# Patient Record
Sex: Male | Born: 1970 | Race: White | Hispanic: No | Marital: Single | Smoking: Never smoker
Health system: Southern US, Community
[De-identification: ages and names within clinical notes are randomized; demographics above are authoritative.]

## PROBLEM LIST (undated history)

## (undated) DIAGNOSIS — I509 Heart failure, unspecified: Secondary | ICD-10-CM

## (undated) DIAGNOSIS — I10 Essential (primary) hypertension: Secondary | ICD-10-CM

## (undated) DIAGNOSIS — E119 Type 2 diabetes mellitus without complications: Secondary | ICD-10-CM

## (undated) HISTORY — PX: TOE AMPUTATION: SHX809

## (undated) HISTORY — PX: CHOLECYSTECTOMY: SHX55

## (undated) HISTORY — PX: BACK SURGERY: SHX140

---

## 2002-06-14 ENCOUNTER — Ambulatory Visit (HOSPITAL_BASED_OUTPATIENT_CLINIC_OR_DEPARTMENT_OTHER): Admission: RE | Admit: 2002-06-14 | Discharge: 2002-06-14 | Payer: Self-pay | Admitting: Family Medicine

## 2002-08-11 ENCOUNTER — Ambulatory Visit (HOSPITAL_BASED_OUTPATIENT_CLINIC_OR_DEPARTMENT_OTHER): Admission: RE | Admit: 2002-08-11 | Discharge: 2002-08-11 | Payer: Self-pay | Admitting: Family Medicine

## 2011-01-02 ENCOUNTER — Emergency Department (HOSPITAL_COMMUNITY)
Admission: EM | Admit: 2011-01-02 | Discharge: 2011-01-02 | Disposition: A | Discharge status: Home / Self Care | Payer: Self-pay | Attending: Emergency Medicine | Admitting: Emergency Medicine

## 2011-01-02 DIAGNOSIS — E119 Type 2 diabetes mellitus without complications: Secondary | ICD-10-CM | POA: Insufficient documentation

## 2011-01-02 DIAGNOSIS — Z9119 Patient's noncompliance with other medical treatment and regimen: Secondary | ICD-10-CM | POA: Insufficient documentation

## 2011-01-02 DIAGNOSIS — L0889 Other specified local infections of the skin and subcutaneous tissue: Secondary | ICD-10-CM | POA: Insufficient documentation

## 2011-01-02 DIAGNOSIS — Z91199 Patient's noncompliance with other medical treatment and regimen due to unspecified reason: Secondary | ICD-10-CM | POA: Insufficient documentation

## 2011-01-02 DIAGNOSIS — I10 Essential (primary) hypertension: Secondary | ICD-10-CM | POA: Insufficient documentation

## 2011-01-02 DIAGNOSIS — Z8614 Personal history of Methicillin resistant Staphylococcus aureus infection: Secondary | ICD-10-CM | POA: Insufficient documentation

## 2011-01-02 LAB — CBC
Hemoglobin: 14.7 g/dL (ref 13.0–17.0)
MCHC: 35.2 g/dL (ref 30.0–36.0)
RBC: 5.11 MIL/uL (ref 4.22–5.81)

## 2011-01-02 LAB — GLUCOSE, CAPILLARY
Glucose-Capillary: 438 mg/dL — ABNORMAL HIGH (ref 70–99)
Glucose-Capillary: 300 mg/dL — ABNORMAL HIGH (ref 70–99)

## 2011-01-02 LAB — BASIC METABOLIC PANEL
CO2: 35 mEq/L — ABNORMAL HIGH (ref 19–32)
Calcium: 10.5 mg/dL (ref 8.4–10.5)
Chloride: 92 mEq/L — ABNORMAL LOW (ref 96–112)
GFR calc Af Amer: >60 mL/min (ref >60)
Sodium: 133 mEq/L — ABNORMAL LOW (ref 135–145)

## 2011-01-02 LAB — DIFFERENTIAL
Basophils Absolute: 0 10*3/uL (ref 0.0–0.1)
Basophils Relative: 0 % (ref 0–1)
Neutro Abs: 8.3 10*3/uL — ABNORMAL HIGH (ref 1.7–7.7)
Neutrophils Relative %: 68 % (ref 43–77)

## 2018-05-04 DIAGNOSIS — E119 Type 2 diabetes mellitus without complications: Secondary | ICD-10-CM | POA: Insufficient documentation

## 2018-05-04 DIAGNOSIS — Y999 Unspecified external cause status: Secondary | ICD-10-CM | POA: Insufficient documentation

## 2018-05-04 DIAGNOSIS — I10 Essential (primary) hypertension: Secondary | ICD-10-CM | POA: Insufficient documentation

## 2018-05-04 DIAGNOSIS — S3991XA Unspecified injury of abdomen, initial encounter: Secondary | ICD-10-CM | POA: Insufficient documentation

## 2018-05-04 DIAGNOSIS — Y9389 Activity, other specified: Secondary | ICD-10-CM | POA: Insufficient documentation

## 2018-05-04 DIAGNOSIS — S2242XA Multiple fractures of ribs, left side, initial encounter for closed fracture: Secondary | ICD-10-CM | POA: Diagnosis not present

## 2018-05-04 DIAGNOSIS — Y92410 Unspecified street and highway as the place of occurrence of the external cause: Secondary | ICD-10-CM | POA: Insufficient documentation

## 2018-05-04 DIAGNOSIS — R0789 Other chest pain: Secondary | ICD-10-CM | POA: Diagnosis present

## 2018-05-05 ENCOUNTER — Emergency Department (HOSPITAL_BASED_OUTPATIENT_CLINIC_OR_DEPARTMENT_OTHER): Payer: BLUE CROSS/BLUE SHIELD

## 2018-05-05 ENCOUNTER — Encounter (HOSPITAL_BASED_OUTPATIENT_CLINIC_OR_DEPARTMENT_OTHER): Payer: Self-pay | Admitting: *Deleted

## 2018-05-05 ENCOUNTER — Other Ambulatory Visit: Payer: Self-pay

## 2018-05-05 ENCOUNTER — Emergency Department (HOSPITAL_BASED_OUTPATIENT_CLINIC_OR_DEPARTMENT_OTHER)
Admission: EM | Admit: 2018-05-05 | Discharge: 2018-05-05 | Disposition: A | Discharge status: Home / Self Care | Payer: BLUE CROSS/BLUE SHIELD | Attending: Emergency Medicine | Admitting: Emergency Medicine

## 2018-05-05 DIAGNOSIS — S2242XA Multiple fractures of ribs, left side, initial encounter for closed fracture: Secondary | ICD-10-CM

## 2018-05-05 HISTORY — DX: Type 2 diabetes mellitus without complications: E11.9

## 2018-05-05 HISTORY — DX: Essential (primary) hypertension: I10

## 2018-05-05 LAB — CBC WITH DIFFERENTIAL/PLATELET
BASOS ABS: 0 10*3/uL (ref 0.0–0.1)
BASOS PCT: 0 %
EOS ABS: 0.2 10*3/uL (ref 0.0–0.7)
Eosinophils Relative: 2 %
HCT: 46.8 % (ref 39.0–52.0)
Hemoglobin: 15.3 g/dL (ref 13.0–17.0)
Lymphocytes Relative: 22 %
Lymphs Abs: 2.2 10*3/uL (ref 0.7–4.0)
MCH: 27.3 pg (ref 26.0–34.0)
MCHC: 32.7 g/dL (ref 30.0–36.0)
MCV: 83.6 fL (ref 78.0–100.0)
MONO ABS: 0.9 10*3/uL (ref 0.1–1.0)
MONOS PCT: 9 %
NEUTROS PCT: 67 %
Neutro Abs: 7.1 10*3/uL (ref 1.7–7.7)
Platelets: 229 10*3/uL (ref 150–400)
RBC: 5.6 MIL/uL (ref 4.22–5.81)
RDW: 13.3 % (ref 11.5–15.5)
WBC: 10.4 10*3/uL (ref 4.0–10.5)

## 2018-05-05 LAB — BASIC METABOLIC PANEL
Anion gap: 12 (ref 5–15)
BUN: 11 mg/dL (ref 6–20)
CALCIUM: 8.6 mg/dL — AB (ref 8.9–10.3)
CO2: 29 mmol/L (ref 22–32)
CREATININE: 0.77 mg/dL (ref 0.61–1.24)
Chloride: 92 mmol/L — ABNORMAL LOW (ref 98–111)
GLUCOSE: 462 mg/dL — AB (ref 70–99)
Potassium: 4.1 mmol/L (ref 3.5–5.1)
Sodium: 133 mmol/L — ABNORMAL LOW (ref 135–145)

## 2018-05-05 MED ORDER — OXYCODONE-ACETAMINOPHEN 5-325 MG PO TABS: 1.0000 | ORAL_TABLET | Freq: Four times a day (QID) | ORAL | 0 refills | Status: DC | PRN

## 2018-05-05 MED ORDER — IOPAMIDOL (ISOVUE-300) INJECTION 61%
100.0000 mL | Freq: Once | INTRAVENOUS | Status: AC
  Administered 2018-05-05: 100 mL via INTRAVENOUS

## 2018-05-05 MED ORDER — SODIUM CHLORIDE 0.9 % IV BOLUS
1000.0000 mL | Freq: Once | INTRAVENOUS | Status: AC
Start: 2018-05-05 — End: 2018-05-05
  Administered 2018-05-05: 1000 mL via INTRAVENOUS

## 2018-05-05 MED ORDER — HYDROMORPHONE HCL 1 MG/ML IJ SOLN
1.0000 mg | Freq: Once | INTRAMUSCULAR | Status: AC
  Administered 2018-05-05: 1 mg via INTRAVENOUS
  Filled 2018-05-05: qty 1
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
## 2018-05-05 NOTE — Discharge Instructions (Addendum)
Percocet as prescribed as needed for pain.  Up with your primary doctor in the next week for a recheck, and return to the emergency department if you develop worsening pain, high fever, difficulty breathing, or other new and concerning symptoms.

## 2018-05-05 NOTE — ED Notes (Signed)
Patient transported to CT 

## 2018-05-05 NOTE — ED Notes (Signed)
Pt understood dc material. NAD noted. Script given at dc  
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
## 2018-05-27 ENCOUNTER — Emergency Department (HOSPITAL_BASED_OUTPATIENT_CLINIC_OR_DEPARTMENT_OTHER): Payer: BLUE CROSS/BLUE SHIELD

## 2018-05-27 ENCOUNTER — Other Ambulatory Visit: Payer: Self-pay

## 2018-05-27 ENCOUNTER — Encounter (HOSPITAL_BASED_OUTPATIENT_CLINIC_OR_DEPARTMENT_OTHER): Payer: Self-pay

## 2018-05-27 ENCOUNTER — Emergency Department (HOSPITAL_BASED_OUTPATIENT_CLINIC_OR_DEPARTMENT_OTHER)
Admission: EM | Admit: 2018-05-27 | Discharge: 2018-05-27 | Disposition: A | Discharge status: Home / Self Care | Payer: BLUE CROSS/BLUE SHIELD | Attending: Emergency Medicine | Admitting: Emergency Medicine

## 2018-05-27 DIAGNOSIS — I10 Essential (primary) hypertension: Secondary | ICD-10-CM | POA: Insufficient documentation

## 2018-05-27 DIAGNOSIS — Z7984 Long term (current) use of oral hypoglycemic drugs: Secondary | ICD-10-CM | POA: Insufficient documentation

## 2018-05-27 DIAGNOSIS — S2242XD Multiple fractures of ribs, left side, subsequent encounter for fracture with routine healing: Secondary | ICD-10-CM | POA: Insufficient documentation

## 2018-05-27 DIAGNOSIS — S2242XA Multiple fractures of ribs, left side, initial encounter for closed fracture: Secondary | ICD-10-CM

## 2018-05-27 DIAGNOSIS — R072 Precordial pain: Secondary | ICD-10-CM | POA: Diagnosis not present

## 2018-05-27 DIAGNOSIS — E119 Type 2 diabetes mellitus without complications: Secondary | ICD-10-CM | POA: Diagnosis not present

## 2018-05-27 DIAGNOSIS — R079 Chest pain, unspecified: Secondary | ICD-10-CM | POA: Diagnosis present

## 2018-05-27 LAB — CBC
HCT: 43.3 % (ref 39.0–52.0)
HEMOGLOBIN: 14.3 g/dL (ref 13.0–17.0)
MCH: 28.5 pg (ref 26.0–34.0)
MCHC: 33 g/dL (ref 30.0–36.0)
MCV: 86.4 fL (ref 80.0–100.0)
Platelets: 233 10*3/uL (ref 150–400)
RBC: 5.01 MIL/uL (ref 4.22–5.81)
RDW: 12.9 % (ref 11.5–15.5)
WBC: 9 10*3/uL (ref 4.0–10.5)
nRBC: 0 % (ref 0.0–0.2)

## 2018-05-27 LAB — BASIC METABOLIC PANEL
Anion gap: 8 (ref 5–15)
BUN: 12 mg/dL (ref 6–20)
CHLORIDE: 99 mmol/L (ref 98–111)
CO2: 26 mmol/L (ref 22–32)
Calcium: 8.8 mg/dL — ABNORMAL LOW (ref 8.9–10.3)
Creatinine, Ser: 0.6 mg/dL — ABNORMAL LOW (ref 0.61–1.24)
GFR calc Af Amer: >60 mL/min (ref >60)
GFR calc non Af Amer: >60 mL/min (ref >60)
GLUCOSE: 382 mg/dL — AB (ref 70–99)
Potassium: 3.9 mmol/L (ref 3.5–5.1)
Sodium: 133 mmol/L — ABNORMAL LOW (ref 135–145)

## 2018-05-27 LAB — TROPONIN I: Troponin I: <0.03 ng/mL (ref <0.03)

## 2018-05-27 MED ORDER — HYDROCODONE-ACETAMINOPHEN 5-325 MG PO TABS: 1.0000 | ORAL_TABLET | ORAL | 0 refills | Status: DC | PRN

## 2018-05-27 MED ORDER — OXYCODONE-ACETAMINOPHEN 5-325 MG PO TABS
2.0000 | ORAL_TABLET | Freq: Once | ORAL | Status: AC
  Administered 2018-05-27: 2 via ORAL
  Filled 2018-05-27: qty 2

## 2018-05-27 MED ORDER — IOPAMIDOL (ISOVUE-370) INJECTION 76%
100.0000 mL | Freq: Once | INTRAVENOUS | Status: AC | PRN
  Administered 2018-05-27: 100 mL via INTRAVENOUS

## 2018-05-27 NOTE — ED Notes (Signed)
Pt verbalizes understanding of d/c instructions and denies any further needs at this time. 
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
## 2018-05-27 NOTE — ED Notes (Signed)
Patient transported to CT 

## 2019-03-16 ENCOUNTER — Emergency Department (HOSPITAL_COMMUNITY): Payer: BC Managed Care – PPO

## 2019-03-16 ENCOUNTER — Encounter (HOSPITAL_COMMUNITY): Payer: Self-pay | Admitting: *Deleted

## 2019-03-16 ENCOUNTER — Emergency Department (HOSPITAL_COMMUNITY)
Admission: EM | Admit: 2019-03-16 | Discharge: 2019-03-16 | Disposition: A | Discharge status: Home / Self Care | Payer: BC Managed Care – PPO | Attending: Emergency Medicine | Admitting: Emergency Medicine

## 2019-03-16 ENCOUNTER — Other Ambulatory Visit: Payer: Self-pay

## 2019-03-16 DIAGNOSIS — Y92410 Unspecified street and highway as the place of occurrence of the external cause: Secondary | ICD-10-CM | POA: Insufficient documentation

## 2019-03-16 DIAGNOSIS — S80212A Abrasion, left knee, initial encounter: Secondary | ICD-10-CM | POA: Insufficient documentation

## 2019-03-16 DIAGNOSIS — S50311A Abrasion of right elbow, initial encounter: Secondary | ICD-10-CM | POA: Diagnosis not present

## 2019-03-16 DIAGNOSIS — Y999 Unspecified external cause status: Secondary | ICD-10-CM | POA: Insufficient documentation

## 2019-03-16 DIAGNOSIS — I10 Essential (primary) hypertension: Secondary | ICD-10-CM | POA: Insufficient documentation

## 2019-03-16 DIAGNOSIS — S2231XA Fracture of one rib, right side, initial encounter for closed fracture: Secondary | ICD-10-CM | POA: Diagnosis not present

## 2019-03-16 DIAGNOSIS — S50312A Abrasion of left elbow, initial encounter: Secondary | ICD-10-CM | POA: Insufficient documentation

## 2019-03-16 DIAGNOSIS — S80211A Abrasion, right knee, initial encounter: Secondary | ICD-10-CM | POA: Diagnosis not present

## 2019-03-16 DIAGNOSIS — Z7984 Long term (current) use of oral hypoglycemic drugs: Secondary | ICD-10-CM | POA: Insufficient documentation

## 2019-03-16 DIAGNOSIS — Y939 Activity, unspecified: Secondary | ICD-10-CM | POA: Diagnosis not present

## 2019-03-16 DIAGNOSIS — Z79899 Other long term (current) drug therapy: Secondary | ICD-10-CM | POA: Insufficient documentation

## 2019-03-16 DIAGNOSIS — T07XXXA Unspecified multiple injuries, initial encounter: Secondary | ICD-10-CM

## 2019-03-16 DIAGNOSIS — E1165 Type 2 diabetes mellitus with hyperglycemia: Secondary | ICD-10-CM | POA: Insufficient documentation

## 2019-03-16 DIAGNOSIS — R739 Hyperglycemia, unspecified: Secondary | ICD-10-CM

## 2019-03-16 DIAGNOSIS — S29001A Unspecified injury of muscle and tendon of front wall of thorax, initial encounter: Secondary | ICD-10-CM | POA: Diagnosis present

## 2019-03-16 LAB — CBG MONITORING, ED: Glucose-Capillary: 474 mg/dL — ABNORMAL HIGH (ref 70–99)

## 2019-03-16 MED ORDER — PERCOCET 5-325 MG PO TABS: 1.0000 | ORAL_TABLET | Freq: Four times a day (QID) | ORAL | 0 refills | Status: DC | PRN

## 2019-03-16 MED ORDER — INSULIN ASPART 100 UNIT/ML ~~LOC~~ SOLN
8.0000 [IU] | Freq: Once | SUBCUTANEOUS | Status: AC
  Administered 2019-03-16: 8 [IU] via SUBCUTANEOUS
  Filled 2019-03-16: qty 1

## 2019-03-16 MED ORDER — KETOROLAC TROMETHAMINE 30 MG/ML IJ SOLN
30.0000 mg | Freq: Once | INTRAMUSCULAR | Status: AC
  Administered 2019-03-16: 30 mg via INTRAMUSCULAR
  Filled 2019-03-16: qty 1

## 2019-03-16 MED ORDER — NAPROXEN 500 MG PO TABS: 500.0000 mg | ORAL_TABLET | Freq: Two times a day (BID) | ORAL | 0 refills | Status: DC

## 2019-03-16 MED ORDER — CEPHALEXIN 500 MG PO CAPS
500.0000 mg | ORAL_CAPSULE | Freq: Once | ORAL | Status: AC
  Administered 2019-03-16: 500 mg via ORAL
  Filled 2019-03-16: qty 1

## 2019-03-16 MED ORDER — CYCLOBENZAPRINE HCL 5 MG PO TABS: 5.0000 mg | ORAL_TABLET | Freq: Three times a day (TID) | ORAL | 0 refills | Status: DC | PRN

## 2019-03-16 MED ORDER — CEPHALEXIN 500 MG PO CAPS: 500.0000 mg | ORAL_CAPSULE | Freq: Three times a day (TID) | ORAL | 0 refills | Status: DC

## 2019-03-16 NOTE — Discharge Instructions (Addendum)
Take the medication as prescribed for your pain.  Use ice packs for comfort. Take the antibiotics.  Try to clean your abrasions once a day with soap and water.  Try to take a big breath every 30 to 60 minutes so your lung does not collapse and you get pneumonia.  Recheck if you get a fever, cough, struggle to breathe, or you start coughing up blood or mucus.  Please look at the diabetic diet information, your diabetes is not well controlled.  If you need longer time out for work or more pain medication you will need to discuss that with your primary care doctor.
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
## 2019-03-16 NOTE — ED Notes (Signed)
Respiratory at bedside to perform instructions on IS.

## 2019-03-16 NOTE — ED Notes (Signed)
Advised patient not to drive while taking prescription pain medications. Patient verbalized understanding.  
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
## 2019-09-07 ENCOUNTER — Encounter (HOSPITAL_BASED_OUTPATIENT_CLINIC_OR_DEPARTMENT_OTHER): Payer: Self-pay | Admitting: Emergency Medicine

## 2019-09-07 ENCOUNTER — Emergency Department (HOSPITAL_BASED_OUTPATIENT_CLINIC_OR_DEPARTMENT_OTHER): Payer: BC Managed Care – PPO

## 2019-09-07 ENCOUNTER — Emergency Department (HOSPITAL_BASED_OUTPATIENT_CLINIC_OR_DEPARTMENT_OTHER)
Admission: EM | Admit: 2019-09-07 | Discharge: 2019-09-07 | Disposition: A | Discharge status: Home / Self Care | Payer: BC Managed Care – PPO | Attending: Emergency Medicine | Admitting: Emergency Medicine

## 2019-09-07 ENCOUNTER — Other Ambulatory Visit: Payer: Self-pay

## 2019-09-07 DIAGNOSIS — Y999 Unspecified external cause status: Secondary | ICD-10-CM | POA: Insufficient documentation

## 2019-09-07 DIAGNOSIS — Z79899 Other long term (current) drug therapy: Secondary | ICD-10-CM | POA: Diagnosis not present

## 2019-09-07 DIAGNOSIS — W2209XA Striking against other stationary object, initial encounter: Secondary | ICD-10-CM | POA: Insufficient documentation

## 2019-09-07 DIAGNOSIS — S93601A Unspecified sprain of right foot, initial encounter: Secondary | ICD-10-CM

## 2019-09-07 DIAGNOSIS — L03115 Cellulitis of right lower limb: Secondary | ICD-10-CM | POA: Diagnosis not present

## 2019-09-07 DIAGNOSIS — Y92003 Bedroom of unspecified non-institutional (private) residence as the place of occurrence of the external cause: Secondary | ICD-10-CM | POA: Insufficient documentation

## 2019-09-07 DIAGNOSIS — E119 Type 2 diabetes mellitus without complications: Secondary | ICD-10-CM | POA: Insufficient documentation

## 2019-09-07 DIAGNOSIS — I10 Essential (primary) hypertension: Secondary | ICD-10-CM | POA: Diagnosis not present

## 2019-09-07 DIAGNOSIS — Y939 Activity, unspecified: Secondary | ICD-10-CM | POA: Insufficient documentation

## 2019-09-07 DIAGNOSIS — Z7984 Long term (current) use of oral hypoglycemic drugs: Secondary | ICD-10-CM | POA: Diagnosis not present

## 2019-09-07 DIAGNOSIS — S99921A Unspecified injury of right foot, initial encounter: Secondary | ICD-10-CM | POA: Diagnosis present

## 2019-09-07 LAB — CBG MONITORING, ED: Glucose-Capillary: 342 mg/dL — ABNORMAL HIGH (ref 70–99)

## 2019-09-07 MED ORDER — IBUPROFEN 600 MG PO TABS: 600.0000 mg | ORAL_TABLET | Freq: Four times a day (QID) | ORAL | 0 refills | Status: DC | PRN

## 2019-09-07 MED ORDER — CLINDAMYCIN HCL 150 MG PO CAPS
450.0000 mg | ORAL_CAPSULE | Freq: Once | ORAL | Status: AC
  Administered 2019-09-07: 450 mg via ORAL
  Filled 2019-09-07: qty 3

## 2019-09-07 MED ORDER — CLINDAMYCIN HCL 150 MG PO CAPS: 300.0000 mg | ORAL_CAPSULE | Freq: Four times a day (QID) | ORAL | 0 refills | Status: AC

## 2019-09-07 MED ORDER — IBUPROFEN 800 MG PO TABS
800.0000 mg | ORAL_TABLET | Freq: Once | ORAL | Status: AC
  Administered 2019-09-07: 21:00:00 800 mg via ORAL
  Filled 2019-09-07: qty 1

## 2019-09-07 NOTE — ED Provider Notes (Signed)
[facility] EMERGENCY DEPARTMENT Provider Note   CSN: [id number] Arrival date & time: 09/07/19  1854     History Chief Complaint  Patient presents with  . Foot Injury    Dakota Ramirez is a 49 y.o. male w/ DM, HTN, presenting to the ED with right foot injury after accidentally kicking his nightstand 4 nights ago.  He has a swollen and painful right foot.  He denies fevers, chills.  He denies open wounds on his foot.  Reports some paresthesias in his right foot.    No other reported injuries. NKDA  HPI     Past Medical History:  Diagnosis Date  . Diabetes mellitus without complication (HCC)   . Hypertension     There are no problems to display for this patient.   Past Surgical History:  Procedure Laterality Date  . BACK SURGERY    . CHOLECYSTECTOMY         Family History  Problem Relation Age of Onset  . Diabetes Mother   . Hypertension Mother     Social History   Tobacco Use  . Smoking status: Never Smoker  . Smokeless tobacco: Never Used  Substance Use Topics  . Alcohol use: Never  . Drug use: Never    Home Medications Prior to Admission medications   Medication Sig Start Date End Date Taking? Authorizing Provider  cephALEXin (KEFLEX) 500 MG capsule Take 1 capsule (500 mg total) by mouth 3 (three) times daily. 03/16/19   Devoria Albe, MD  clindamycin (CLEOCIN) 150 MG capsule Take 2 capsules (300 mg total) by mouth every 6 (six) hours for 7 days. 09/08/19 09/15/19  Terald Sleeper, MD  cyclobenzaprine (FLEXERIL) 5 MG tablet Take 1 tablet (5 mg total) by mouth 3 (three) times daily as needed. 03/16/19   Devoria Albe, MD  GLIPIZIDE PO Take by mouth.    [provider]  HYDROcodone-acetaminophen (NORCO/VICODIN) 5-325 MG tablet Take 1 tablet by mouth every 4 (four) hours as needed for moderate pain. 05/27/18   Azalia Bilis, MD  ibuprofen (ADVIL) 600 MG tablet Take 1 tablet (600 mg total) by mouth every 6 (six) hours as needed. 09/07/19   Terald Sleeper, MD  LISINOPRIL PO Take by mouth.    [provider]  naproxen (NAPROSYN) 500 MG tablet Take 1 tablet (500 mg total) by mouth 2 (two) times daily. 03/16/19   Devoria Albe, MD  PERCOCET 5-325 MG tablet Take 1 tablet by mouth every 6 (six) hours as needed for severe pain. 03/16/19   Devoria Albe, MD  SITagliptin-metFORMIN HCl (JANUMET PO) Take by mouth.    [provider]    Allergies    Patient has no known allergies.  Review of Systems   Review of Systems  Constitutional: Negative for chills and fever.  Respiratory: Negative for cough and shortness of breath.   Cardiovascular: Negative for chest pain and palpitations.  Gastrointestinal: Negative for abdominal pain and vomiting.  Genitourinary: Negative for dysuria and hematuria.  Musculoskeletal: Positive for arthralgias, joint swelling and myalgias.  Skin: Positive for color change and rash.  Neurological: Negative for syncope and headaches.  All other systems reviewed and are negative.   Physical Exam Updated Vital Signs BP 137/88 (BP Location: Left Arm)   Pulse (!) 117   Temp 98.2 F (36.8 C) (Oral)   Resp 18   Ht 5\' 11"  (1.803 m)   Wt 117.9 kg   SpO2 98%   BMI 36.26 kg/m  Physical Exam Vitals and nursing note reviewed.  Constitutional:      Appearance: He is well-developed.  HENT:     Head: Normocephalic and atraumatic.  Eyes:     Conjunctiva/sclera: Conjunctivae normal.  Cardiovascular:     Rate and Rhythm: Normal rate and regular rhythm.     Pulses: Normal pulses.  Pulmonary:     Effort: Pulmonary effort is normal. No respiratory distress.  Musculoskeletal:     Cervical back: Neck supple.  Skin:    General: Skin is warm and dry.     Comments: Soft tissue swelling, warmth and edema of the dorsum of the right foot, generalized mid-foot tenderness No posterior malleolar tenderness of the right ankle  Neurological:     Mental Status: He is alert and oriented to person, place, and time.        ED Results / Procedures / Treatments   Labs (all labs ordered are listed, but only abnormal results are displayed) Labs Reviewed  CBG MONITORING, ED - Abnormal; Notable for the following components:      Result Value   Glucose-Capillary 342 (*)    All other components within normal limits    EKG None  Radiology DG Foot Complete Right  Result Date: 09/07/2019 CLINICAL DATA:  Injury with pain and swelling EXAM: RIGHT FOOT COMPLETE - 3+ VIEW COMPARISON:  None. FINDINGS: There is no evidence of fracture or dislocation. There is no evidence of arthropathy or other focal bone abnormality. Dorsal soft tissue swelling. Small plantar calcaneal spur IMPRESSION: No acute osseous abnormality Electronically Signed   By: Jasmine Pang M.D.   On: 09/07/2019 19:43    Procedures Procedures (including critical care time)  Medications Ordered in ED Medications  clindamycin (CLEOCIN) capsule 450 mg (450 mg Oral Given 09/07/19 2058)  ibuprofen (ADVIL) tablet 800 mg (800 mg Oral Given 09/07/19 2058)    ED Course  I have reviewed the triage vital signs and the nursing notes.  Pertinent labs & imaging results that were available during my care of the patient were reviewed by me and considered in my medical decision making (see chart for details).  49 yo male presenting to ED 4 days after kicking a nightstand with pain and swelling in his right foot.  Photo as noted above.  There is a significant amount of edema of the foot.  No crepitus or open wound or free air seen on xray to suggest nec fasc.    Given his warmth and redness, as well as his history of diabetes that appears not to be optimally controlled, I think it's reasonable to treat with a course of clindamycin for cellulitis.  I carefully discussed with him return precautions - for fevers, redness spreading up the leg, or purple/black discoloration of the foot or toes.  He verbalized understanding.  We'll also put him in a boot and make  non-weight bearing until he can be seen by orthopedics.  Advised follow up this week.  Clinical Course as of Sep 07 1112  Mon Sep 07, 2019  2034 FINDINGS: There is no evidence of fracture or dislocation. There is no evidence of arthropathy or other focal bone abnormality. Dorsal soft tissue swelling. Small plantar calcaneal spur  IMPRESSION: No acute osseous abnormality      [MT]  2034 We will start the patient on clindamycin.  Advised him to closely monitor his foot wound.  If the redness spreading further up the leg, or begins having fevers, told he needs to come  back immediately to the emergency department.  Also given crutches and instructed that he be nonweightbearing.   [MT]    Clinical Course User Index [MT] Wyvonnia Dusky, MD   Final Clinical Impression(s) / ED Diagnoses Final diagnoses:  Sprain of right foot, initial encounter  Cellulitis of right lower extremity    Rx / DC Orders ED Discharge Orders         Ordered    clindamycin (CLEOCIN) 150 MG capsule  Every 6 hours     09/07/19 2038    ibuprofen (ADVIL) 600 MG tablet  Every 6 hours PRN     09/07/19 2038           Wyvonnia Dusky, MD 09/08/19 1116

## 2019-09-07 NOTE — ED Triage Notes (Signed)
Pt states he got up to use the restroom and kicked into the end table about 3 -4 nights ago  Pt is c/o right foot pain  Pt states it is swollen

## 2019-09-07 NOTE — Discharge Instructions (Signed)
I am treating you for a skin infection of your right foot and also for a possible severe sprain.  You should use crutches at home and not put weight on this foot.  You need to follow up with an orthopedic doctor this week.  Take your full course of antibiotics.  If the redness is spreading further up your leg, or you start having fevers, or your skin turns dark purple or black, come back to the ER immediately.

## 2021-11-26 ENCOUNTER — Other Ambulatory Visit: Payer: Self-pay

## 2021-11-26 ENCOUNTER — Encounter (HOSPITAL_BASED_OUTPATIENT_CLINIC_OR_DEPARTMENT_OTHER): Payer: Self-pay | Admitting: Emergency Medicine

## 2021-11-26 ENCOUNTER — Inpatient Hospital Stay (HOSPITAL_BASED_OUTPATIENT_CLINIC_OR_DEPARTMENT_OTHER)
Admission: EM | Admit: 2021-11-26 | Discharge: 2021-12-01 | DRG: 286 | Disposition: A | Discharge status: Home / Self Care | Payer: Self-pay | Attending: Internal Medicine | Admitting: Internal Medicine

## 2021-11-26 ENCOUNTER — Emergency Department (HOSPITAL_BASED_OUTPATIENT_CLINIC_OR_DEPARTMENT_OTHER): Payer: Self-pay

## 2021-11-26 DIAGNOSIS — I5020 Unspecified systolic (congestive) heart failure: Secondary | ICD-10-CM

## 2021-11-26 DIAGNOSIS — E11621 Type 2 diabetes mellitus with foot ulcer: Secondary | ICD-10-CM

## 2021-11-26 DIAGNOSIS — I10 Essential (primary) hypertension: Secondary | ICD-10-CM

## 2021-11-26 DIAGNOSIS — J9811 Atelectasis: Secondary | ICD-10-CM | POA: Diagnosis not present

## 2021-11-26 DIAGNOSIS — I428 Other cardiomyopathies: Secondary | ICD-10-CM | POA: Diagnosis present

## 2021-11-26 DIAGNOSIS — Z560 Unemployment, unspecified: Secondary | ICD-10-CM

## 2021-11-26 DIAGNOSIS — Z89421 Acquired absence of other right toe(s): Secondary | ICD-10-CM

## 2021-11-26 DIAGNOSIS — I340 Nonrheumatic mitral (valve) insufficiency: Secondary | ICD-10-CM | POA: Diagnosis present

## 2021-11-26 DIAGNOSIS — E1165 Type 2 diabetes mellitus with hyperglycemia: Secondary | ICD-10-CM | POA: Diagnosis present

## 2021-11-26 DIAGNOSIS — Z91148 Patient's other noncompliance with medication regimen for other reason: Secondary | ICD-10-CM

## 2021-11-26 DIAGNOSIS — I509 Heart failure, unspecified: Secondary | ICD-10-CM

## 2021-11-26 DIAGNOSIS — I5021 Acute systolic (congestive) heart failure: Secondary | ICD-10-CM | POA: Diagnosis present

## 2021-11-26 DIAGNOSIS — R188 Other ascites: Secondary | ICD-10-CM | POA: Diagnosis present

## 2021-11-26 DIAGNOSIS — I110 Hypertensive heart disease with heart failure: Principal | ICD-10-CM | POA: Diagnosis present

## 2021-11-26 DIAGNOSIS — N5089 Other specified disorders of the male genital organs: Secondary | ICD-10-CM | POA: Diagnosis present

## 2021-11-26 DIAGNOSIS — E669 Obesity, unspecified: Secondary | ICD-10-CM | POA: Diagnosis present

## 2021-11-26 DIAGNOSIS — Z79899 Other long term (current) drug therapy: Secondary | ICD-10-CM

## 2021-11-26 DIAGNOSIS — E1151 Type 2 diabetes mellitus with diabetic peripheral angiopathy without gangrene: Secondary | ICD-10-CM | POA: Diagnosis present

## 2021-11-26 DIAGNOSIS — E8809 Other disorders of plasma-protein metabolism, not elsewhere classified: Secondary | ICD-10-CM | POA: Diagnosis not present

## 2021-11-26 DIAGNOSIS — Z608 Other problems related to social environment: Secondary | ICD-10-CM | POA: Diagnosis present

## 2021-11-26 DIAGNOSIS — Z833 Family history of diabetes mellitus: Secondary | ICD-10-CM

## 2021-11-26 DIAGNOSIS — E119 Type 2 diabetes mellitus without complications: Secondary | ICD-10-CM

## 2021-11-26 DIAGNOSIS — Z8249 Family history of ischemic heart disease and other diseases of the circulatory system: Secondary | ICD-10-CM

## 2021-11-26 DIAGNOSIS — Y929 Unspecified place or not applicable: Secondary | ICD-10-CM

## 2021-11-26 DIAGNOSIS — W57XXXA Bitten or stung by nonvenomous insect and other nonvenomous arthropods, initial encounter: Secondary | ICD-10-CM | POA: Diagnosis present

## 2021-11-26 DIAGNOSIS — Z6838 Body mass index (BMI) 38.0-38.9, adult: Secondary | ICD-10-CM

## 2021-11-26 DIAGNOSIS — Z89411 Acquired absence of right great toe: Secondary | ICD-10-CM

## 2021-11-26 DIAGNOSIS — I739 Peripheral vascular disease, unspecified: Secondary | ICD-10-CM | POA: Diagnosis present

## 2021-11-26 DIAGNOSIS — E871 Hypo-osmolality and hyponatremia: Secondary | ICD-10-CM | POA: Diagnosis present

## 2021-11-26 DIAGNOSIS — I5082 Biventricular heart failure: Secondary | ICD-10-CM | POA: Diagnosis present

## 2021-11-26 HISTORY — DX: Unspecified systolic (congestive) heart failure: I50.20

## 2021-11-26 LAB — CBC WITH DIFFERENTIAL/PLATELET
Abs Immature Granulocytes: 0.04 10*3/uL (ref 0.00–0.07)
Basophils Absolute: 0.1 10*3/uL (ref 0.0–0.1)
Basophils Relative: 1 %
Eosinophils Absolute: 0.1 10*3/uL (ref 0.0–0.5)
Eosinophils Relative: 1 %
HCT: 41.8 % (ref 39.0–52.0)
Hemoglobin: 12.9 g/dL — ABNORMAL LOW (ref 13.0–17.0)
Immature Granulocytes: 1 %
Lymphocytes Relative: 29 %
Lymphs Abs: 1.8 10*3/uL (ref 0.7–4.0)
MCH: 26.2 pg (ref 26.0–34.0)
MCHC: 30.9 g/dL (ref 30.0–36.0)
MCV: 85 fL (ref 80.0–100.0)
Monocytes Absolute: 0.6 10*3/uL (ref 0.1–1.0)
Monocytes Relative: 9 %
Neutro Abs: 3.5 10*3/uL (ref 1.7–7.7)
Neutrophils Relative %: 59 %
Platelets: 286 10*3/uL (ref 150–400)
RBC: 4.92 MIL/uL (ref 4.22–5.81)
RDW: 14.1 % (ref 11.5–15.5)
WBC: 6.1 10*3/uL (ref 4.0–10.5)
nRBC: 0 % (ref 0.0–0.2)

## 2021-11-26 LAB — COMPREHENSIVE METABOLIC PANEL
ALT: 21 U/L (ref 0–44)
AST: 33 U/L (ref 15–41)
Albumin: 2.8 g/dL — ABNORMAL LOW (ref 3.5–5.0)
Alkaline Phosphatase: 114 U/L (ref 38–126)
Anion gap: 7 (ref 5–15)
BUN: 22 mg/dL — ABNORMAL HIGH (ref 6–20)
CO2: 27 mmol/L (ref 22–32)
Calcium: 8.4 mg/dL — ABNORMAL LOW (ref 8.9–10.3)
Chloride: 98 mmol/L (ref 98–111)
Creatinine, Ser: 1.07 mg/dL (ref 0.61–1.24)
GFR, Estimated: >60 mL/min (ref >60)
Glucose, Bld: 306 mg/dL — ABNORMAL HIGH (ref 70–99)
Potassium: 4 mmol/L (ref 3.5–5.1)
Sodium: 132 mmol/L — ABNORMAL LOW (ref 135–145)
Total Bilirubin: 0.5 mg/dL (ref 0.3–1.2)
Total Protein: 8 g/dL (ref 6.5–8.1)

## 2021-11-26 LAB — GLUCOSE, CAPILLARY
Glucose-Capillary: 183 mg/dL — ABNORMAL HIGH (ref 70–99)
Glucose-Capillary: 221 mg/dL — ABNORMAL HIGH (ref 70–99)

## 2021-11-26 LAB — CBG MONITORING, ED
Glucose-Capillary: 219 mg/dL — ABNORMAL HIGH (ref 70–99)
Glucose-Capillary: 69 mg/dL — ABNORMAL LOW (ref 70–99)
Glucose-Capillary: 186 mg/dL — ABNORMAL HIGH (ref 70–99)

## 2021-11-26 LAB — HEMOGLOBIN A1C
Hgb A1c MFr Bld: 12.4 % — ABNORMAL HIGH (ref 4.8–5.6)
Mean Plasma Glucose: 309.18 mg/dL

## 2021-11-26 LAB — URINALYSIS, ROUTINE W REFLEX MICROSCOPIC
Bilirubin Urine: NEGATIVE
Glucose, UA: 250 mg/dL — AB
Ketones, ur: NEGATIVE mg/dL
Leukocytes,Ua: NEGATIVE
Nitrite: NEGATIVE
Protein, ur: 30 mg/dL — AB
Specific Gravity, Urine: 1.02 (ref 1.005–1.030)
pH: 6 (ref 5.0–8.0)

## 2021-11-26 LAB — BRAIN NATRIURETIC PEPTIDE: B Natriuretic Peptide: 398.2 pg/mL — ABNORMAL HIGH (ref 0.0–100.0)

## 2021-11-26 LAB — TROPONIN I (HIGH SENSITIVITY)
Troponin I (High Sensitivity): 13 ng/L (ref <18)
Troponin I (High Sensitivity): 14 ng/L (ref <18)

## 2021-11-26 LAB — URINALYSIS, MICROSCOPIC (REFLEX)

## 2021-11-26 LAB — D-DIMER, QUANTITATIVE: D-Dimer, Quant: 2.04 ug/mL-FEU — ABNORMAL HIGH (ref 0.00–0.50)

## 2021-11-26 MED ORDER — INSULIN REGULAR(HUMAN) IN NACL 100-0.9 UT/100ML-% IV SOLN
INTRAVENOUS | Status: DC
Start: 2021-11-26 — End: 2021-11-26
  Administered 2021-11-26: 18 [IU]/h via INTRAVENOUS
  Filled 2021-11-26: qty 100

## 2021-11-26 MED ORDER — FUROSEMIDE 10 MG/ML IJ SOLN
40.0000 mg | Freq: Once | INTRAMUSCULAR | Status: AC
  Administered 2021-11-26: 40 mg via INTRAVENOUS
  Filled 2021-11-26: qty 4

## 2021-11-26 MED ORDER — FUROSEMIDE 10 MG/ML IJ SOLN
40.0000 mg | Freq: Two times a day (BID) | INTRAMUSCULAR | Status: DC
  Administered 2021-11-27 – 2021-11-28 (×3): 40 mg via INTRAVENOUS
  Filled 2021-11-26 (×3): qty 4

## 2021-11-26 MED ORDER — DEXTROSE 50 % IV SOLN: 0.0000 mL | INTRAVENOUS | Status: DC | PRN

## 2021-11-26 MED ORDER — INSULIN ASPART 100 UNIT/ML IJ SOLN
0.0000 [IU] | INTRAMUSCULAR | Status: DC
  Administered 2021-11-27: 3 [IU] via SUBCUTANEOUS
  Administered 2021-11-27: 1 [IU] via SUBCUTANEOUS
  Administered 2021-11-27: 2 [IU] via SUBCUTANEOUS
  Administered 2021-11-27: 3 [IU] via SUBCUTANEOUS
  Administered 2021-11-27: 2 [IU] via SUBCUTANEOUS
  Administered 2021-11-27: 3 [IU] via SUBCUTANEOUS
  Administered 2021-11-28: 1 [IU] via SUBCUTANEOUS
  Administered 2021-11-28: 5 [IU] via SUBCUTANEOUS
  Administered 2021-11-28 (×2): 2 [IU] via SUBCUTANEOUS
  Administered 2021-11-28: 3 [IU] via SUBCUTANEOUS
  Administered 2021-11-29: 2 [IU] via SUBCUTANEOUS
  Administered 2021-11-29: 1 [IU] via SUBCUTANEOUS
  Administered 2021-11-29: 9 [IU] via SUBCUTANEOUS
  Administered 2021-11-29: 1 [IU] via SUBCUTANEOUS
  Administered 2021-11-29 (×2): 2 [IU] via SUBCUTANEOUS
  Administered 2021-11-30 (×2): 5 [IU] via SUBCUTANEOUS
  Administered 2021-11-30: 1 [IU] via SUBCUTANEOUS
  Administered 2021-11-30: 2 [IU] via SUBCUTANEOUS

## 2021-11-26 MED ORDER — IOHEXOL 350 MG/ML SOLN
100.0000 mL | Freq: Once | INTRAVENOUS | Status: AC | PRN
  Administered 2021-11-26: 100 mL via INTRAVENOUS

## 2021-11-26 MED ORDER — ENOXAPARIN SODIUM 40 MG/0.4ML IJ SOSY: 40.0000 mg | PREFILLED_SYRINGE | INTRAMUSCULAR | Status: DC

## 2021-11-26 MED ORDER — INSULIN REGULAR HUMAN 100 UNIT/ML IJ SOLN
10.0000 [IU] | Freq: Once | INTRAMUSCULAR | Status: DC
  Filled 2021-11-26: qty 3

## 2021-11-26 MED ORDER — INSULIN DETEMIR 100 UNIT/ML ~~LOC~~ SOLN
10.0000 [IU] | Freq: Every day | SUBCUTANEOUS | Status: DC
  Filled 2021-11-26: qty 0.1

## 2021-11-26 MED ORDER — FUROSEMIDE 10 MG/ML IJ SOLN: 40.0000 mg | Freq: Two times a day (BID) | INTRAMUSCULAR | Status: DC

## 2021-11-26 MED ORDER — ACETAMINOPHEN 650 MG RE SUPP: 650.0000 mg | Freq: Four times a day (QID) | RECTAL | Status: DC | PRN

## 2021-11-26 MED ORDER — ENOXAPARIN SODIUM 80 MG/0.8ML IJ SOSY
70.0000 mg | PREFILLED_SYRINGE | INTRAMUSCULAR | Status: DC
  Administered 2021-11-26 – 2021-11-28 (×3): 70 mg via SUBCUTANEOUS
  Filled 2021-11-26 (×3): qty 0.8

## 2021-11-26 MED ORDER — INSULIN ASPART 100 UNIT/ML IJ SOLN: 0.0000 [IU] | Freq: Three times a day (TID) | INTRAMUSCULAR | Status: DC

## 2021-11-26 MED ORDER — ACETAMINOPHEN 325 MG PO TABS
650.0000 mg | ORAL_TABLET | Freq: Four times a day (QID) | ORAL | Status: DC | PRN
  Administered 2021-11-30: 650 mg via ORAL
  Filled 2021-11-26: qty 2

## 2021-11-26 NOTE — Progress Notes (Signed)
Plan of Care Note for accepted transfer ? ? ?Patient: Dakota Ramirez MRN: [id number]   DOA: 11/26/2021 ? ?Facility requesting transfer: [facility] ?Requesting Provider: EDP ?Reason for transfer: new onset of chf/anasarca , hyperglycemia ?Facility course:  ? ?H/o uncontrolled DM2 presents to [facility] with fluids overload, found to have new onset of chf/anasarca , hyperglycemia ? ?Per EDP patient vital signs are stable, he is on room air ?Blood pressure (!) 114/92, pulse (!) 108, temperature 97.8 ?F (36.6 ?C), temperature source Oral, resp. rate 16, height 5\' 11"  (1.803 m), weight 120.2 kg, SpO2 100 % on room air ? ?He received iv lasix, insulin, EDP talked to Dr who states cardiology will consult and hospitalist to admit. ? ? ? ?Plan of care: ?The patient is accepted for admission to Telemetry unit, at [facility]. Or [facility] ? ? ?Author: ?[facility], MD PhD FACP ?11/26/2021 ? ?Check [email or web address] for on-call coverage. ? ?Nursing staff, Please call [facility] Admits & Consults System-Wide number on Amion as soon as patient's arrival, so appropriate admitting provider can evaluate the pt. ?

## 2021-11-26 NOTE — ED Notes (Signed)
Report given to floor nurse Dian Queen, RN. ?

## 2021-11-26 NOTE — ED Notes (Signed)
[facility] to bedside to assume care of patient. ?

## 2021-11-26 NOTE — ED Notes (Addendum)
Pt alert and oriented. Pt's friend getting him something to eat per request. ?

## 2021-11-26 NOTE — H&P (Signed)
?History and Physical  ? ? Dakota Ramirez DOB: 02-22-1971 DOA: 11/26/2021 ? ?PCP: Patient, No Pcp Per (Inactive) ? ?Patient coming from: [facility] ED ? ?Chief Complaint: Shortness of breath ? ?HPI: Dakota Ramirez is a 51 y.o. male with medical history significant of poorly controlled type 2 diabetes not on insulin (A1c 12.8 in 12/2020, hypertension presented to the ED with complaints of shortness of breath and scrotal swelling.  Found to be significantly fluid overloaded with pitting edema of bilateral lower extremities extending up to the scrotum and abdomen.  Slightly tachycardic but remainder vital signs stable.  BNP elevated at 398.  High-sensitivity troponin negative x2.  D-dimer 2.04.  Glucose elevated at 306 with normal bicarb and anion gap.  CTA chest negative for PE.  Showing new cardiomegaly with trace right pleural effusion, moderate upper abdominal ascites, and anasarca.  Patient was given IV Lasix 40 mg and IV insulin. ED physician spoke with Dr. Jacques Navy, cardiology will consult. ? ?Patient states he is feeling short of breath and his body is swollen including abdomen and testicles.  He is not sure when his symptoms started, he thinks maybe a few days ago.  Denies personal history of CHF but does report family history (mother and maternal aunt).  Denies chest pain.  Denies nausea, vomiting, diarrhea, or constipation.  States he has urinated multiple times since after he received IV Lasix in the emergency room.  Patient states he was previously advised to take insulin a long time ago but did not take it.  He is supposed to be taking Janumet and glipizide but stopped taking them a long time ago. ? ?Review of Systems:  ?Review of Systems  ?All other systems reviewed and are negative. ? ?Past Medical History:  ?Diagnosis Date  ? Diabetes mellitus without complication (HCC)   ? Hypertension   ? ? ?Past Surgical History:  ?Procedure Laterality Date  ? BACK SURGERY    ? CHOLECYSTECTOMY    ? 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 ? ? reports that he has never smoked. He has never used smokeless tobacco. He reports that he does not drink alcohol and does not use drugs. ? ?No Known Allergies ? ?Family History  ?Problem Relation Age of Onset  ? Diabetes Mother   ? Hypertension Mother   ? ? ?Prior to Admission medications   ?Medication Sig Start Date End Date Taking? Authorizing Provider  ?cephALEXin (KEFLEX) 500 MG capsule Take 1 capsule (500 mg total) by mouth 3 (three) times daily. 03/16/19   Devoria Albe, MD  ?cyclobenzaprine (FLEXERIL) 5 MG tablet Take 1 tablet (5 mg total) by mouth 3 (three) times daily as needed. 03/16/19   Devoria Albe, MD  ?GLIPIZIDE PO Take by mouth.    [provider]  ?HYDROcodone-acetaminophen (NORCO/VICODIN) 5-325 MG tablet Take 1 tablet by mouth every 4 (four) hours as needed for moderate pain. 05/27/18   Azalia Bilis, MD  ?ibuprofen (ADVIL) 600 MG tablet Take 1 tablet (600 mg total) by mouth every 6 (six) hours as needed. 09/07/19   Terald Sleeper, MD  ?LISINOPRIL PO Take by mouth.    [provider]  ?naproxen (NAPROSYN) 500 MG tablet Take 1 tablet (500 mg total) by mouth 2 (two) times daily. 03/16/19   Devoria Albe, MD  ?PERCOCET 5-325 MG tablet Take 1 tablet by mouth every 6 (six) hours as needed for severe pain. 03/16/19   Devoria Albe, MD  ?SITagliptin-metFORMIN  HCl (JANUMET PO) Take by mouth.    [provider]  ? ? ?Physical Exam: ?Vitals:  ? 11/26/21 1930 11/26/21 1945 11/26/21 2000 11/26/21 2015  ?BP: (!) 136/104 (!) 143/99 (!) 143/99 (!) 126/114  ?Pulse: (!) 110 (!) 109 (!) 107 (!) 111  ?Resp: 19 11 12 15   ?Temp:      ?TempSrc:      ?SpO2: 98% 96% 95% 100%  ?Weight:      ?Height:      ? ? ?Physical Exam ?Vitals and nursing note reviewed. Exam conducted with a chaperone present (RN at bedside).  ?Constitutional:   ?   General: He is not in acute distress. ?HENT:  ?   Head: Normocephalic and atraumatic.  ?Eyes:  ?   Extraocular Movements: Extraocular  movements intact.  ?Cardiovascular:  ?   Rate and Rhythm: Normal rate and regular rhythm.  ?   Pulses: Normal pulses.  ?Pulmonary:  ?   Effort: Pulmonary effort is normal. No respiratory distress.  ?   Breath sounds: No wheezing or rales.  ?Abdominal:  ?   General: Bowel sounds are normal. There is distension.  ?   Palpations: Abdomen is soft.  ?   Tenderness: There is no abdominal tenderness. There is no guarding or rebound.  ?Musculoskeletal:  ?   Cervical back: Normal range of motion and neck supple.  ?   Right lower leg: Edema present.  ?   Left lower leg: Edema present.  ?   Comments: Pitting edema of bilateral lower extremities extending up to the abdomen.  Significant scrotal edema.  ?Skin: ?   General: Skin is warm and dry.  ?Neurological:  ?   General: No focal deficit present.  ?   Mental Status: He is alert and oriented to person, place, and time.  ?  ? ?Labs on Admission: I have personally reviewed following labs and imaging studies ? ?CBC: ?Recent Labs  ?Lab 11/26/21 ?1314  ?WBC 6.1  ?NEUTROABS 3.5  ?HGB 12.9*  ?HCT 41.8  ?MCV 85.0  ?PLT 286  ? ?Basic Metabolic Panel: ?Recent Labs  ?Lab 11/26/21 ?1314  ?NA 132*  ?K 4.0  ?CL 98  ?CO2 27  ?GLUCOSE 306*  ?BUN 22*  ?CREATININE 1.07  ?CALCIUM 8.4*  ? ?GFR: ?Estimated Creatinine Clearance: 110.2 mL/min (by C-G formula based on SCr of 1.07 mg/dL). ?Liver Function Tests: ?Recent Labs  ?Lab 11/26/21 ?1314  ?AST 33  ?ALT 21  ?ALKPHOS 114  ?BILITOT 0.5  ?PROT 8.0  ?ALBUMIN 2.8*  ? ?No results for input(s): LIPASE, AMYLASE in the last 168 hours. ?No results for input(s): AMMONIA in the last 168 hours. ?Coagulation Profile: ?No results for input(s): INR, PROTIME in the last 168 hours. ?Cardiac Enzymes: ?No results for input(s): CKTOTAL, CKMB, CKMBINDEX, TROPONINI in the last 168 hours. ?BNP (last 3 results) ?No results for input(s): PROBNP in the last 8760 hours. ?HbA1C: ?No results for input(s): HGBA1C in the last 72 hours. ?CBG: ?Recent Labs  ?Lab 11/26/21 ?1615  11/26/21 ?1745 11/26/21 ?2011  ?GLUCAP 219* 69* 186*  ? ?Lipid Profile: ?No results for input(s): CHOL, HDL, LDLCALC, TRIG, CHOLHDL, LDLDIRECT in the last 72 hours. ?Thyroid Function Tests: ?No results for input(s): TSH, T4TOTAL, FREET4, T3FREE, THYROIDAB in the last 72 hours. ?Anemia Panel: ?No results for input(s): VITAMINB12, FOLATE, FERRITIN, TIBC, IRON, RETICCTPCT in the last 72 hours. ?Urine analysis: ?   ?Component Value Date/Time  ? COLORURINE YELLOW 11/26/2021 1423  ? APPEARANCEUR CLEAR 11/26/2021 1423  ?  LABSPEC 1.020 11/26/2021 1423  ? PHURINE 6.0 11/26/2021 1423  ? GLUCOSEU 250 (A) 11/26/2021 1423  ? HGBUR TRACE (A) 11/26/2021 1423  ? BILIRUBINUR NEGATIVE 11/26/2021 1423  ? KETONESUR NEGATIVE 11/26/2021 1423  ? PROTEINUR 30 (A) 11/26/2021 1423  ? NITRITE NEGATIVE 11/26/2021 1423  ? LEUKOCYTESUR NEGATIVE 11/26/2021 1423  ? ? ?Radiological Exams on Admission: I have personally reviewed images ?DG Chest 2 View ? ?Result Date: 11/26/2021 ?CLINICAL DATA:  Scrotal edema, fluid overload, shortness of breath EXAM: CHEST - 2 VIEW COMPARISON:  03/16/2019. FINDINGS: There is significant interval increase in transverse diameter of heart. Possibility of pericardial effusion is not excluded. Central pulmonary vessels are prominent without signs of alveolar pulmonary edema. There are small linear densities in right lower lung fields. There is no focal pulmonary consolidation. There is blunting of right lateral costophrenic angle. There is no pneumothorax. IMPRESSION: There is significant interval increase in transverse diameter of heart. Possibility of pericardial effusion is not excluded. Central pulmonary vessels are prominent without signs of alveolar pulmonary edema. There are linear densities in right lower lung fields suggesting subsegmental atelectasis. Blunting of right lateral CP angle suggests small right pleural effusion. Electronically Signed   By: Ernie Avena M.D.   On: 11/26/2021 13:30  ? ?CT Angio  Chest PE W and/or Wo Contrast ? ?Result Date: 11/26/2021 ?CLINICAL DATA:  Shortness of breath.  Right-sided chest swelling. EXAM: CT ANGIOGRAPHY CHEST WITH CONTRAST TECHNIQUE: Multidetector CT imaging of the c

## 2021-11-26 NOTE — ED Notes (Signed)
Report given to [facility]. ETA 10-15 min. ?

## 2021-11-26 NOTE — ED Provider Notes (Signed)
?[facility] EMERGENCY DEPARTMENT ?Provider Note ? ? ?CSN: [id number] ?Arrival date & time: 11/26/21  1221 ? ?  ? ?History ? ?Chief Complaint  ?Patient presents with  ? Groin Swelling  ? ? ?Dakota Ramirez is a 51 y.o. male with history of type 2 diabetes, poorly controlled with numerous diabetic foot infections who presents to the ED for evaluation of testicular swelling that he first noticed about 3 to 4 days ago and has been increasing since then.  He denies any sharp pain, although he is uncomfortable and needs to "walk like a cowboy."  Patient also endorses new shortness of breath along with fluid in his abdomen.  He denies chest pain, abdominal pain, nausea, vomiting, diarrhea.  Patient denies previous cardiac history and denies history of CHF although he notes that his mother was diagnosed with this.  No treatment prior to arrival. ? ?HPI ? ?  ? ?Home Medications ?Prior to Admission medications   ?Medication Sig Start Date End Date Taking? Authorizing Provider  ?cephALEXin (KEFLEX) 500 MG capsule Take 1 capsule (500 mg total) by mouth 3 (three) times daily. 03/16/19   Devoria Albe, MD  ?cyclobenzaprine (FLEXERIL) 5 MG tablet Take 1 tablet (5 mg total) by mouth 3 (three) times daily as needed. 03/16/19   Devoria Albe, MD  ?GLIPIZIDE PO Take by mouth.    [provider]  ?HYDROcodone-acetaminophen (NORCO/VICODIN) 5-325 MG tablet Take 1 tablet by mouth every 4 (four) hours as needed for moderate pain. 05/27/18   Azalia Bilis, MD  ?ibuprofen (ADVIL) 600 MG tablet Take 1 tablet (600 mg total) by mouth every 6 (six) hours as needed. 09/07/19   Terald Sleeper, MD  ?LISINOPRIL PO Take by mouth.    [provider]  ?naproxen (NAPROSYN) 500 MG tablet Take 1 tablet (500 mg total) by mouth 2 (two) times daily. 03/16/19   Devoria Albe, MD  ?PERCOCET 5-325 MG tablet Take 1 tablet by mouth every 6 (six) hours as needed for severe pain. 03/16/19   Devoria Albe, MD  ?SITagliptin-metFORMIN HCl (JANUMET PO) Take by  mouth.    [provider]  ?   ? ?Allergies    ?Patient has no known allergies.   ? ?Review of Systems   ?Review of Systems ? ?Physical Exam ?Updated Vital Signs ?BP (!) 114/92   Pulse (!) 108   Temp 97.8 ?F (36.6 ?C) (Oral)   Resp 16   Ht 5\' 11"  (1.803 m)   Wt 120.2 kg   SpO2 100%   BMI 36.96 kg/m?  ?Physical Exam ?Vitals and nursing note reviewed. Exam conducted with a chaperone present.  ?Constitutional:   ?   General: He is not in acute distress. ?   Appearance: He is not ill-appearing.  ?HENT:  ?   Head: Atraumatic.  ?Eyes:  ?   Conjunctiva/sclera: Conjunctivae normal.  ?Cardiovascular:  ?   Rate and Rhythm: Normal rate and regular rhythm.  ?   Pulses: Normal pulses.     ?     Radial pulses are 2+ on the right side and 2+ on the left side.  ?     Dorsalis pedis pulses are 2+ on the right side and 2+ on the left side.  ?   Heart sounds: No murmur heard. ?   Comments: Negative JVD, heart sounds normal ?Pulmonary:  ?   Effort: Pulmonary effort is normal. No respiratory distress.  ?   Breath sounds: Normal breath sounds.  ?  Comments: Lung clear to ausculation bilaterally. No tachypnea, no accessory muscle use, no acute distress, no increased work of breathing, no decrease in air movement ? ? ?Abdominal:  ?   General: Abdomen is protuberant. There is no distension.  ?   Palpations: There is fluid wave.  ?   Tenderness: There is no abdominal tenderness.  ?Genitourinary: ?   Comments: Bilateral testicles edematous with pitting edema consistent with anasarca, mild left-sided tenderness to palpation ?Musculoskeletal:     ?   General: Normal range of motion.  ?   Cervical back: Normal range of motion.  ?   Right lower leg: 1+ Pitting Edema present.  ?   Left lower leg: 1+ Pitting Edema present.  ?Skin: ?   General: Skin is warm and dry.  ?   Capillary Refill: Capillary refill takes less than 2 seconds.  ?Neurological:  ?   General: No focal deficit present.  ?   Mental Status: He is alert.   ?Psychiatric:     ?   Mood and Affect: Mood normal.  ? ? ?ED Results / Procedures / Treatments   ?Labs ?(all labs ordered are listed, but only abnormal results are displayed) ?Labs Reviewed  ?COMPREHENSIVE METABOLIC PANEL - Abnormal; Notable for the following components:  ?    Result Value  ? Sodium 132 (*)   ? Glucose, Bld 306 (*)   ? BUN 22 (*)   ? Calcium 8.4 (*)   ? Albumin 2.8 (*)   ? All other components within normal limits  ?CBC WITH DIFFERENTIAL/PLATELET - Abnormal; Notable for the following components:  ? Hemoglobin 12.9 (*)   ? All other components within normal limits  ?URINALYSIS, ROUTINE W REFLEX MICROSCOPIC - Abnormal; Notable for the following components:  ? Glucose, UA 250 (*)   ? Hgb urine dipstick TRACE (*)   ? Protein, ur 30 (*)   ? All other components within normal limits  ?BRAIN NATRIURETIC PEPTIDE - Abnormal; Notable for the following components:  ? B Natriuretic Peptide 398.2 (*)   ? All other components within normal limits  ?D-DIMER, QUANTITATIVE - Abnormal; Notable for the following components:  ? D-Dimer, Quant 2.04 (*)   ? All other components within normal limits  ?URINALYSIS, MICROSCOPIC (REFLEX) - Abnormal; Notable for the following components:  ? Bacteria, UA FEW (*)   ? All other components within normal limits  ?CBG MONITORING, ED - Abnormal; Notable for the following components:  ? Glucose-Capillary 219 (*)   ? All other components within normal limits  ?CBG MONITORING, ED - Abnormal; Notable for the following components:  ? Glucose-Capillary 69 (*)   ? All other components within normal limits  ?HEMOGLOBIN A1C  ?TROPONIN I (HIGH SENSITIVITY)  ?TROPONIN I (HIGH SENSITIVITY)  ? ? ?EKG ?EKG Interpretation ? ?Date/Time:  Sunday November 26 2021 12:55:58 EDT ?Ventricular Rate:  111 ?PR Interval:  159 ?QRS Duration: 94 ?QT Interval:  353 ?QTC Calculation: 480 ?R Axis:   108 ?Text Interpretation: Sinus tachycardia Lateral infarct, recent No significant change since last tracing Confirmed  by Melene Plan [phone]) on 11/26/2021 1:11:43 PM ? ?Radiology ?DG Chest 2 View ? ?Result Date: 11/26/2021 ?CLINICAL DATA:  Scrotal edema, fluid overload, shortness of breath EXAM: CHEST - 2 VIEW COMPARISON:  03/16/2019. FINDINGS: There is significant interval increase in transverse diameter of heart. Possibility of pericardial effusion is not excluded. Central pulmonary vessels are prominent without signs of alveolar pulmonary edema. There are small linear densities in right lower  lung fields. There is no focal pulmonary consolidation. There is blunting of right lateral costophrenic angle. There is no pneumothorax. IMPRESSION: There is significant interval increase in transverse diameter of heart. Possibility of pericardial effusion is not excluded. Central pulmonary vessels are prominent without signs of alveolar pulmonary edema. There are linear densities in right lower lung fields suggesting subsegmental atelectasis. Blunting of right lateral CP angle suggests small right pleural effusion. Electronically Signed   By: Ernie Avena M.D.   On: 11/26/2021 13:30  ? ?CT Angio Chest PE W and/or Wo Contrast ? ?Result Date: 11/26/2021 ?CLINICAL DATA:  Shortness of breath.  Right-sided chest swelling. EXAM: CT ANGIOGRAPHY CHEST WITH CONTRAST TECHNIQUE: Multidetector CT imaging of the chest was performed using the standard protocol during bolus administration of intravenous contrast. Multiplanar CT image reconstructions and MIPs were obtained to evaluate the vascular anatomy. RADIATION DOSE REDUCTION: This exam was performed according to the departmental dose-optimization program which includes automated exposure control, adjustment of the mA and/or kV according to patient size and/or use of iterative reconstruction technique. CONTRAST:  OMNIPAQUE IOHEXOL 350 MG/ML SOLN COMPARISON:  Chest x-ray from same day. CT chest dated May 27, 2018. FINDINGS: Cardiovascular: Satisfactory opacification of the pulmonary  arteries to the segmental level. No evidence of pulmonary embolism. New cardiomegaly. No pericardial effusion. No thoracic aortic aneurysm or dissection. Mediastinum/Nodes: No enlarged mediastinal, hilar, or axillary lymph nod

## 2021-11-26 NOTE — ED Notes (Signed)
Patient given OJ at this time for BG of 69.  ?

## 2021-11-26 NOTE — ED Notes (Signed)
ED Provider at bedside. 

## 2021-11-26 NOTE — ED Notes (Signed)
Pt transported to radiology.

## 2021-11-26 NOTE — ED Triage Notes (Signed)
Pt reports testicles are "swollen up like a coconut" since Fri; also reports RT side chest swelling; SHOB ?

## 2021-11-27 ENCOUNTER — Inpatient Hospital Stay (HOSPITAL_COMMUNITY): Payer: Self-pay

## 2021-11-27 ENCOUNTER — Other Ambulatory Visit (HOSPITAL_COMMUNITY): Payer: Medicaid Other

## 2021-11-27 DIAGNOSIS — I5021 Acute systolic (congestive) heart failure: Secondary | ICD-10-CM

## 2021-11-27 DIAGNOSIS — W57XXXA Bitten or stung by nonvenomous insect and other nonvenomous arthropods, initial encounter: Secondary | ICD-10-CM | POA: Diagnosis present

## 2021-11-27 LAB — ECHOCARDIOGRAM COMPLETE
Calc EF: 21 %
Height: 71 in
MV M vel: 5.14 m/s
MV Peak grad: 105.7 mmHg
Radius: 0.5 cm
S' Lateral: 5.7 cm
Single Plane A2C EF: 16.7 %
Single Plane A4C EF: 27.9 %
Weight: 4939.2 oz

## 2021-11-27 LAB — BASIC METABOLIC PANEL
Anion gap: 6 (ref 5–15)
BUN: 18 mg/dL (ref 6–20)
CO2: 28 mmol/L (ref 22–32)
Calcium: 8.3 mg/dL — ABNORMAL LOW (ref 8.9–10.3)
Chloride: 102 mmol/L (ref 98–111)
Creatinine, Ser: 0.95 mg/dL (ref 0.61–1.24)
GFR, Estimated: >60 mL/min (ref >60)
Glucose, Bld: 217 mg/dL — ABNORMAL HIGH (ref 70–99)
Potassium: 3.9 mmol/L (ref 3.5–5.1)
Sodium: 136 mmol/L (ref 135–145)

## 2021-11-27 LAB — GLUCOSE, CAPILLARY
Glucose-Capillary: 235 mg/dL — ABNORMAL HIGH (ref 70–99)
Glucose-Capillary: 161 mg/dL — ABNORMAL HIGH (ref 70–99)
Glucose-Capillary: 147 mg/dL — ABNORMAL HIGH (ref 70–99)
Glucose-Capillary: 229 mg/dL — ABNORMAL HIGH (ref 70–99)
Glucose-Capillary: 188 mg/dL — ABNORMAL HIGH (ref 70–99)

## 2021-11-27 LAB — HIV ANTIBODY (ROUTINE TESTING W REFLEX): HIV Screen 4th Generation wRfx: NONREACTIVE

## 2021-11-27 MED ORDER — INSULIN ASPART PROT & ASPART (70-30 MIX) 100 UNIT/ML ~~LOC~~ SUSP
10.0000 [IU] | Freq: Two times a day (BID) | SUBCUTANEOUS | Status: DC
  Administered 2021-11-27: 10 [IU] via SUBCUTANEOUS
  Filled 2021-11-27: qty 10

## 2021-11-27 MED ORDER — SPIRONOLACTONE 25 MG PO TABS
25.0000 mg | ORAL_TABLET | Freq: Every day | ORAL | Status: DC
  Administered 2021-11-27 – 2021-12-01 (×5): 25 mg via ORAL
  Filled 2021-11-27 (×5): qty 1

## 2021-11-27 MED ORDER — LIVING WELL WITH DIABETES BOOK
Freq: Once | Status: AC
  Administered 2021-11-27: 1
  Filled 2021-11-27: qty 1

## 2021-11-27 MED ORDER — PERFLUTREN LIPID MICROSPHERE
1.0000 mL | INTRAVENOUS | Status: AC | PRN
  Administered 2021-11-27: 2 mL via INTRAVENOUS
  Filled 2021-11-27: qty 10

## 2021-11-27 MED ORDER — ONDANSETRON HCL 4 MG/2ML IJ SOLN
4.0000 mg | Freq: Four times a day (QID) | INTRAMUSCULAR | Status: DC | PRN
Start: 2021-11-27 — End: 2021-12-01

## 2021-11-27 NOTE — Progress Notes (Signed)
?PROGRESS NOTE ? ? ? Dakota Ramirez  HUT:[id number] DOB: [birth year] DOA: 11/26/2021 ?PCP: Patient, No Pcp Per (Inactive)  ?Narrative: 50/M with history of uncontrolled type 2 diabetes mellitus, hemoglobin A1c of 12.8 in 5/22, hypertension, noncompliance presented to the ED with shortness of breath, abdominal wall and scrotal swelling, symptoms ongoing for 1 to 2 weeks, very poor historian ?-In the ED BNP 398, troponin negative, D-dimer 2.0, blood glucose 306, CTA chest noted cardiomegaly, trace pleural effusion, upper abdominal ascites and anasarca ? ?Subjective: ?-Feels tired, short of breath, abdomen swollen ? ?Assessment & Plan: ? ?New onset CHF ?-Continue IV Lasix today, add Aldactone ?-Echocardiogram pending, clinically more right heart failure suspected ?-Cardiology following, monitor I's/O, daily weights ?-Albumin is low, urinalysis with microalbuminuria only, check liver ultrasound ?-Monitor I's/O, daily weights, TOC consult ?-Dietitian consult ? ?Uncontrolled type 2 diabetes mellitus ?-Hemoglobin A1c is 12.4, noncompliant with meds ?-Restart 70/30 insulin, will add SGLT2i this admission ?-Diabetes coordinator consult ? ?Right foot transmetatarsal amputation ?-Followed by podiatry at [facility], underwent transmetatarsal amputation 7/22 ?-Continue wound care ? ?Hypertension ?-BP is stable ? ? ?DVT prophylaxis: Lovenox ?Code Status: Full code ?Family Communication: Discussed patient in detail, no family at bedside ?Disposition Plan: Home pending improvement in volume status, cardiac work-up ? ?Consultants:  ?Cardiology ? ?Procedures:  ? ?Antimicrobials:  ? ? ?Objective: ?Vitals:  ? 11/26/21 2108 11/26/21 2353 11/27/21 0339 11/27/21 0748  ?BP: 114/85 (!) 133/97 122/83 (!) 122/101  ?Pulse: (!) 110  (!) 113 (!) 112  ?Resp: 18 20 19 18   ?Temp: 98.5 ?F (36.9 ?C) 98.3 ?F (36.8 ?C) 98.4 ?F (36.9 ?C) 98 ?F (36.7 ?C)  ?TempSrc: Oral Oral Oral Oral  ?SpO2: 96% 99% 97% 98%  ?Weight: (!) 140.2 kg  (!) 140 kg   ?Height: 5\' 11"   (1.803 m)     ? ? ?Intake/Output Summary (Last 24 hours) at 11/27/2021 0938 ?Last data filed at 11/27/2021 0749 ?Gross per 24 hour  ?Intake 388.75 ml  ?Output 2200 ml  ?Net -1811.25 ml  ? ?Filed Weights  ? 11/26/21 1235 11/26/21 2108 11/27/21 0339  ?Weight: 120.2 kg (!) 140.2 kg (!) 140 kg  ? ? ?Examination: ? ?General exam: Disheveled unkempt male laying in bed, AAOx3 ?CVS: S1-S2, regular rate rhythm ?Lungs: Few basilar rales, poor air movement bilaterally ?Abdomen: Soft, obese, abdominal wall edema noted ?Extremities: 2+ edema, right foot transmetatarsal amputation with dry crusted wound  ?Psychiatry: Poor insight ? ?Data Reviewed:  ? ?CBC: ?Recent Labs  ?Lab 11/26/21 ?1314  ?WBC 6.1  ?NEUTROABS 3.5  ?HGB 12.9*  ?HCT 41.8  ?MCV 85.0  ?PLT 286  ? ?Basic Metabolic Panel: ?Recent Labs  ?Lab 11/26/21 ?1314 11/27/21 ?0239  ?NA 132* 136  ?K 4.0 3.9  ?CL 98 102  ?CO2 27 28  ?GLUCOSE 306* 217*  ?BUN 22* 18  ?CREATININE 1.07 0.95  ?CALCIUM 8.4* 8.3*  ? ?GFR: ?Estimated Creatinine Clearance: 134.6 mL/min (by C-G formula based on SCr of 0.95 mg/dL). ?Liver Function Tests: ?Recent Labs  ?Lab 11/26/21 ?1314  ?AST 33  ?ALT 21  ?ALKPHOS 114  ?BILITOT 0.5  ?PROT 8.0  ?ALBUMIN 2.8*  ? ?No results for input(s): LIPASE, AMYLASE in the last 168 hours. ?No results for input(s): AMMONIA in the last 168 hours. ?Coagulation Profile: ?No results for input(s): INR, PROTIME in the last 168 hours. ?Cardiac Enzymes: ?No results for input(s): CKTOTAL, CKMB, CKMBINDEX, TROPONINI in the last 168 hours. ?BNP (last 3 results) ?No results for input(s): PROBNP in the last 8760  hours. ?HbA1C: ?Recent Labs  ?  11/26/21 ?1732  ?HGBA1C 12.4*  ? ?CBG: ?Recent Labs  ?Lab 11/26/21 ?2011 11/26/21 ?2122 11/26/21 ?2356 11/27/21 ?0938 11/27/21 ?1829  ?GLUCAP 186* 183* 221* 235* 161*  ? ?Lipid Profile: ?No results for input(s): CHOL, HDL, LDLCALC, TRIG, CHOLHDL, LDLDIRECT in the last 72 hours. ?Thyroid Function Tests: ?No results for input(s): TSH, T4TOTAL,  FREET4, T3FREE, THYROIDAB in the last 72 hours. ?Anemia Panel: ?No results for input(s): VITAMINB12, FOLATE, FERRITIN, TIBC, IRON, RETICCTPCT in the last 72 hours. ?Urine analysis: ?   ?Component Value Date/Time  ? COLORURINE YELLOW 11/26/2021 1423  ? APPEARANCEUR CLEAR 11/26/2021 1423  ? LABSPEC 1.020 11/26/2021 1423  ? PHURINE 6.0 11/26/2021 1423  ? GLUCOSEU 250 (A) 11/26/2021 1423  ? HGBUR TRACE (A) 11/26/2021 1423  ? BILIRUBINUR NEGATIVE 11/26/2021 1423  ? KETONESUR NEGATIVE 11/26/2021 1423  ? PROTEINUR 30 (A) 11/26/2021 1423  ? NITRITE NEGATIVE 11/26/2021 1423  ? LEUKOCYTESUR NEGATIVE 11/26/2021 1423  ? ?Sepsis Labs: ?@[email or web address] (procalcitonin:4,lacticidven:4) ? ?)No results found for this or any previous visit (from the past 240 hour(s)).  ? ?Radiology Studies: ?DG Chest 2 View ? ?Result Date: 11/26/2021 ?CLINICAL DATA:  Scrotal edema, fluid overload, shortness of breath EXAM: CHEST - 2 VIEW COMPARISON:  03/16/2019. FINDINGS: There is significant interval increase in transverse diameter of heart. Possibility of pericardial effusion is not excluded. Central pulmonary vessels are prominent without signs of alveolar pulmonary edema. There are small linear densities in right lower lung fields. There is no focal pulmonary consolidation. There is blunting of right lateral costophrenic angle. There is no pneumothorax. IMPRESSION: There is significant interval increase in transverse diameter of heart. Possibility of pericardial effusion is not excluded. Central pulmonary vessels are prominent without signs of alveolar pulmonary edema. There are linear densities in right lower lung fields suggesting subsegmental atelectasis. Blunting of right lateral CP angle suggests small right pleural effusion. Electronically Signed   By: 05/16/2019 M.D.   On: 11/26/2021 13:30  ? ?CT Angio Chest PE W and/or Wo Contrast ? ?Result Date: 11/26/2021 ?CLINICAL DATA:  Shortness of breath.  Right-sided chest swelling. EXAM: CT  ANGIOGRAPHY CHEST WITH CONTRAST TECHNIQUE: Multidetector CT imaging of the chest was performed using the standard protocol during bolus administration of intravenous contrast. Multiplanar CT image reconstructions and MIPs were obtained to evaluate the vascular anatomy. RADIATION DOSE REDUCTION: This exam was performed according to the departmental dose-optimization program which includes automated exposure control, adjustment of the mA and/or kV according to patient size and/or use of iterative reconstruction technique. CONTRAST:  11/28/2021 OMNIPAQUE IOHEXOL 350 MG/ML SOLN COMPARISON:  Chest x-ray from same day. CT chest dated May 27, 2018. FINDINGS: Cardiovascular: Satisfactory opacification of the pulmonary arteries to the segmental level. No evidence of pulmonary embolism. New cardiomegaly. No pericardial effusion. No thoracic aortic aneurysm or dissection. Mediastinum/Nodes: No enlarged mediastinal, hilar, or axillary lymph nodes. Thyroid gland, trachea, and esophagus demonstrate no significant findings. Lungs/Pleura: Trace right pleural effusion. Minimal right lower lobe subsegmental atelectasis. No consolidation or pneumothorax. Upper Abdomen: Moderate upper abdominal ascites. Musculoskeletal: Anasarca asymmetrically involving the right chest wall. No acute or significant osseous findings. Multiple old healed left posterior rib fractures. Review of the MIP images confirms the above findings. IMPRESSION: 1. No evidence of pulmonary embolism. 2. New cardiomegaly with trace right pleural effusion, moderate upper abdominal ascites, and anasarca. Electronically Signed   By: May 29, 2018 M.D.   On: 11/26/2021 14:36   ? ? ?Scheduled Meds: ? enoxaparin (LOVENOX) injection  70 mg Subcutaneous Q24H  ? furosemide  40 mg Intravenous BID  ? insulin aspart  0-9 Units Subcutaneous Q4H  ? ?Continuous Infusions: ? ? LOS: 1 day  ? ? ?Time spent: ? ?Zannie Cove, MD ?[facility] ? ? ?11/27/2021, 9:38 AM  ?  ?

## 2021-11-27 NOTE — Progress Notes (Signed)
Inpatient Diabetes Program Recommendations ? ?AACE/ADA: New Consensus Statement on Inpatient Glycemic Control (2015) ? ?Target Ranges:  Prepandial:   less than 140 mg/dL ?     Peak postprandial:   less than 180 mg/dL (1-2 hours) ?     Critically ill patients:  140 - 180 mg/dL  ? ?Lab Results  ?Component Value Date  ? GLUCAP 229 (H) 11/27/2021  ? HGBA1C 12.4 (H) 11/26/2021  ? ? ?Review of Glycemic Control ? ?Diabetes history: DM2 ?Outpatient Diabetes medications: None ?Current orders for Inpatient glycemic control: 70/30 10 units BID, Novolog 0-9 units Q4H ? ?HgbA1C - 12.4% ? ?Inpatient Diabetes Program Recommendations:   ? ?Change Novolog to 0-15 units TID with meals and 0-5 HS ? ?If FBS > 180 mg/dL, increase 06/30 to 15 units BID. ? ?TOC consult for PCP and medications. ? ?Spoke with pt at bedside regarding his HgbA1C of 12.4% (average blood sugar of 309 mg/dL) and diabetes control. Discussed basic patho of DM, home care diet and healthy eating principles, importance of monitoring blood sugars to maintain good glycemic control. Discussed complications of hyperglycemia and uncontrolled blood sugars, both long and short term. Talked about insulin injections. Pt has used insulin in the past and knows how to draw it up in syringe. Asked RN to allow pt to give his own insulin to check technique and pt was in agreement. Will need glucose meter for home.  ?TOC consult for assistance with PCP and medications. ? ?Will f/u for any other questions and concerns. ? ?Thank you. ?Ailene Ards, RD, LDN, CDE ?Inpatient Diabetes Coordinator ?[phone]  ? ? ? ? ? ? ? ? ?

## 2021-11-27 NOTE — Progress Notes (Signed)
Heart Failure Navigator Progress Note ? ?Following this hospitalization to assess for HV TOC readiness.  ? ?Echo pending?  ?New Heart Failure ? ? ?Rhae Hammock, BSN, RN ?Heart Failure Nurse Navigator ?Secure Chat Only   ?

## 2021-11-27 NOTE — Progress Notes (Signed)
?   11/27/21 2256  ?Provider Notification  ?Provider Name/Title J.Gardner/ DO  ?Date Provider Notified 11/27/21  ?Time Provider Notified 2256  ?Method of Notification Page  ?Notification Reason Other (Comment) ?(One live Tick found on ptient's body)  ?Provider response No new orders  ?Date of Provider Response 11/27/21  ?Time of Provider Response 2258  ? ? ?

## 2021-11-27 NOTE — Significant Event (Signed)
Live tick found by RN on abdomen and removed. ? ?No fever, no rash. ? ?Going to hold off on ordering any doxycycline prophylaxis and defer this question to day team.  Will add tick bite to his problem list. ?

## 2021-11-27 NOTE — Plan of Care (Signed)
  Problem: Education: Goal: Knowledge of General Education information will improve Description: Including pain rating scale, medication(s)/side effects and non-pharmacologic comfort measures Outcome: Progressing   Problem: Activity: Goal: Risk for activity intolerance will decrease Outcome: Progressing   Problem: Elimination: Goal: Will not experience complications related to urinary retention Outcome: Progressing   Problem: Safety: Goal: Ability to remain free from injury will improve Outcome: Progressing   

## 2021-11-27 NOTE — Consult Note (Signed)
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 Patient is followed on the community by [facility], Dr. Lysbeth Galas. Last visit was 10/19/2021.  Next visit in her office was scheduled for TODAY, 11/27/21.  Patient will need to cancel appointment and reschedule. ?Wound type: surgical ?Pressure Injury POA: N/A ?Measurement: 4cm x 10cm x 0.2cm ?Wound bed:Dry, crusted ?Drainage (amount, consistency, odor) None ?Periwound: with some evidence of wound healing. No erythema, warmth, or fluctuance. ?Dressing procedure/placement/frequency: Patient dresses his wound at home, he reports "shaving" the area on occasion. He has a surgical shoe and wears it "most of the time". ?I will implement a conservative POC for the wound, using an antimicrobial nonadherent (xeroform) dressing topped with dry gauze, an ABD pad and secured with a Kerlix roll gauze topped with an ACE bandage. ? ?WOC nursing team will not follow, but will remain available to this patient, the nursing and medical teams.  Please re-consult if needed. ?Thanks, ?Ladona Mow, MSN, RN, GNP, CWOCN, CWON-AP, FAAN  ?Pager# [phone]  ? ? ? ?  ?

## 2021-11-27 NOTE — TOC Progression Note (Signed)
Transition of Care (TOC) - Progression Note  ? ? ?Patient Details  ?Name: Dakota Ramirez ?MRN: [id number] ?Date of Birth: 04-30-1971 ? ?Transition of Care (TOC) CM/SW Contact  ?Leone Haven, RN ?Phone Number: ?11/27/2021, 4:01 PM ? ?Clinical Narrative:    ?from home, CHF Ex, iv lasix, scrotum swollen, wound R foot,indep. TOC will continue to follow for dc needs.  ? ? ?  ?  ? ?Expected Discharge Plan and Services ?  ?  ?  ?  ?  ?                ?  ?  ?  ?  ?  ?  ?  ?  ?  ?  ? ? ?Social Determinants of Health (SDOH) Interventions ?  ? ?Readmission Risk Interventions ?   ? View : No data to display.  ?  ?  ?  ? ? ?

## 2021-11-27 NOTE — Consult Note (Addendum)
?Cardiology Consultation:  ? ?Patient ID: Dakota Ramirez ?MRN: [id number]; DOB: 22-Jun-1971 ? ?Admit date: 11/26/2021 ?Date of Consult: 11/27/2021 ? ?PCP:  Patient, No Pcp Per (Inactive) ?  ?[facility] Providers ?Cardiologist:  None   New ? ? ?Patient Profile:  ? ?Dakota Ramirez is a 51 y.o. male with a hx of uncontrolled DM, s/p TMA, HTN,  who is being seen 11/27/2021 for the evaluation of CHF at the request of Dr. Joya Martyr. ? ?History of Present Illness:  ? ?Dakota Ramirez is a 51 yo male with PMH noted above. He has never been evaluated by cardiology in the past. He currently live in a trailer behind his father's house. Does most of his ADLs independently. He has been following with wound care for a TMA of the right foot.  ? ?Presented to [facility] with complaints of dyspnea, abd/scrotal swelling and elevated blood sugars. He has not been taking any medications, thinks he has medicaid but not sure. Says he was told to take meds after his foot surgery but hasn't been. Sleeps slightly propped up in his bed at night. Noticed his pant legs have been getting tighter over the past couple of weeks.  ? ?In the ED labs showed Na 132, K 4, glucose 306, Cr 1.07>>0.95, BNP 398, hsTn 13>>14, WBC 6.1, Hgb 12.9, Ddimer 2.04, Hgb A1c 12.4. EKG ST 111bpm, q waves in lateral leads. CXR with small right pleural effusion, subsegmental atelectasis in the right lower lung fields. CT angio negative for PE, cardiomegaly with trace right pleural effusion, moderate abd ascites and anasarca. He was admitted to IM, started on IV lasix 40mg  BID. Transferred to [facility] for further management. Echo has been completed, read pending.  ? ?In talking with patient, he denies any chest pain. States he is short of breath all the time, but has been this way for years. Denies tobacco use, moderate ETOH use, no drug use. Reports mother had CHF, but denies any known family hx of CAD.  ? ?Past Medical History:  ?Diagnosis Date  ? Diabetes mellitus without complication (HCC)    ? Hypertension   ? ? ?Past Surgical History:  ?Procedure Laterality Date  ? BACK SURGERY    ? CHOLECYSTECTOMY    ? TOE AMPUTATION Right   ? all toes on RT foot  ?  ? ?Home Medications:  ?Prior to Admission medications   ?Medication Sig Start Date End Date Taking? Authorizing Provider  ?ibuprofen (ADVIL) 200 MG tablet Take 800 mg by mouth every 6 (six) hours as needed for mild pain.   Yes [provider]  ?lisinopril (ZESTRIL) 20 MG tablet Take 40 mg by mouth daily.   Yes [provider]  ? ? ?Inpatient Medications: ?Scheduled Meds: ? enoxaparin (LOVENOX) injection  70 mg Subcutaneous Q24H  ? furosemide  40 mg Intravenous BID  ? insulin aspart  0-9 Units Subcutaneous Q4H  ? spironolactone  25 mg Oral Daily  ? ?Continuous Infusions: ? ?PRN Meds: ?acetaminophen **OR** acetaminophen, ondansetron (ZOFRAN) IV, perflutren lipid microspheres (DEFINITY) IV suspension ? ?Allergies:   No Known Allergies ? ?Social History:   ?Social History  ? ?Socioeconomic History  ? Marital status: Single  ?  Spouse name: Not on file  ? Number of children: Not on file  ? Years of education: Not on file  ? Highest education level: Not on file  ?Occupational History  ? Not on file  ?Tobacco Use  ? Smoking status: Never  ? Smokeless tobacco: Never  ?Vaping  Use  ? Vaping Use: Never used  ?Substance and Sexual Activity  ? Alcohol use: Never  ? Drug use: Never  ? Sexual activity: Not on file  ?Other Topics Concern  ? Not on file  ?Social History Narrative  ? Not on file  ? ?Social Determinants of Health  ? ?Financial Resource Strain: Not on file  ?Food Insecurity: Not on file  ?Transportation Needs: Not on file  ?Physical Activity: Not on file  ?Stress: Not on file  ?Social Connections: Not on file  ?Intimate Partner Violence: Not on file  ?  ?Family History:   ? ?Family History  ?Problem Relation Age of Onset  ? Diabetes Mother   ? Hypertension Mother   ?  ? ?ROS:  ?Please see the history of present illness.  ? ?All other ROS  reviewed and negative.    ? ?Physical Exam/Data:  ? ?Vitals:  ? 11/26/21 2108 11/26/21 2353 11/27/21 0339 11/27/21 0748  ?BP: 114/85 (!) 133/97 122/83 (!) 122/101  ?Pulse: (!) 110  (!) 113 (!) 112  ?Resp: 18 20 19 18   ?Temp: 98.5 ?F (36.9 ?C) 98.3 ?F (36.8 ?C) 98.4 ?F (36.9 ?C) 98 ?F (36.7 ?C)  ?TempSrc: Oral Oral Oral Oral  ?SpO2: 96% 99% 97% 98%  ?Weight: (!) 140.2 kg  (!) 140 kg   ?Height: 5\' 11"  (1.803 m)     ? ? ?Intake/Output Summary (Last 24 hours) at 11/27/2021 1056 ?Last data filed at 11/27/2021 1042 ?Gross per 24 hour  ?Intake 388.75 ml  ?Output 2600 ml  ?Net -2211.25 ml  ? ? ?  11/27/2021  ?  3:39 AM 11/26/2021  ?  9:08 PM 11/26/2021  ? 12:35 PM  ?Last 3 Weights  ?Weight (lbs) 308 lb 11.2 oz 309 lb 265 lb  ?Weight (kg) 140.025 kg 140.161 kg 120.203 kg  ?   ?Body mass index is 43.05 kg/m?.  ?General:  Disheveled, younger male, laying comfortably in bed  ?HEENT: normal ?Neck: no JVD ?Vascular: No carotid bruits; Distal pulses 2+ bilaterally ?Cardiac:  normal S1, S2; tachy; no murmur  ?Lungs:  clear to auscultation bilaterally, no wheezing, rhonchi or rales  ?Abd: soft, nontender, no hepatomegaly  ?Ext: 2+ bilateral LE edema, scrotal swelling, abd distention ?Musculoskeletal:  Right TMA wrapped ?Skin: warm and dry  ?Neuro:  CNs 2-12 intact, no focal abnormalities noted ?Psych:  Normal affect  ? ?EKG:  The EKG was personally reviewed and demonstrates:  ST 111bpm, q waves in lateral leads ?Telemetry:  Telemetry was personally reviewed and demonstrates:  ST rates 100s ? ?Relevant CV Studies: ? ?Echo: pending ? ?Laboratory Data: ? ?High Sensitivity Troponin:   ?Recent Labs  ?Lab 11/26/21 ?1314 11/26/21 ?1617  ?TROPONINIHS 13 14  ?   ?Chemistry ?Recent Labs  ?Lab 11/26/21 ?1314 11/27/21 ?0239  ?NA 132* 136  ?K 4.0 3.9  ?CL 98 102  ?CO2 27 28  ?GLUCOSE 306* 217*  ?BUN 22* 18  ?CREATININE 1.07 0.95  ?CALCIUM 8.4* 8.3*  ?GFRNONAA >60 >60  ?ANIONGAP 7 6  ?  ?Recent Labs  ?Lab 11/26/21 ?1314  ?PROT 8.0  ?ALBUMIN 2.8*  ?AST  33  ?ALT 21  ?ALKPHOS 114  ?BILITOT 0.5  ? ?Lipids No results for input(s): CHOL, TRIG, HDL, LABVLDL, LDLCALC, CHOLHDL in the last 168 hours.  ?Hematology ?Recent Labs  ?Lab 11/26/21 ?1314  ?WBC 6.1  ?RBC 4.92  ?HGB 12.9*  ?HCT 41.8  ?MCV 85.0  ?MCH 26.2  ?MCHC 30.9  ?RDW 14.1  ?PLT  286  ? ?Thyroid No results for input(s): TSH, FREET4 in the last 168 hours.  ?BNP ?Recent Labs  ?Lab 11/26/21 ?1314  ?BNP 398.2*  ?  ?DDimer  ?Recent Labs  ?Lab 11/26/21 ?1314  ?DDIMER 2.04*  ? ? ? ?Radiology/Studies:  ?DG Chest 2 View ? ?Result Date: 11/26/2021 ?CLINICAL DATA:  Scrotal edema, fluid overload, shortness of breath EXAM: CHEST - 2 VIEW COMPARISON:  03/16/2019. FINDINGS: There is significant interval increase in transverse diameter of heart. Possibility of pericardial effusion is not excluded. Central pulmonary vessels are prominent without signs of alveolar pulmonary edema. There are small linear densities in right lower lung fields. There is no focal pulmonary consolidation. There is blunting of right lateral costophrenic angle. There is no pneumothorax. IMPRESSION: There is significant interval increase in transverse diameter of heart. Possibility of pericardial effusion is not excluded. Central pulmonary vessels are prominent without signs of alveolar pulmonary edema. There are linear densities in right lower lung fields suggesting subsegmental atelectasis. Blunting of right lateral CP angle suggests small right pleural effusion. Electronically Signed   By: Ernie Avena M.D.   On: 11/26/2021 13:30  ? ?CT Angio Chest PE W and/or Wo Contrast ? ?Result Date: 11/26/2021 ?CLINICAL DATA:  Shortness of breath.  Right-sided chest swelling. EXAM: CT ANGIOGRAPHY CHEST WITH CONTRAST TECHNIQUE: Multidetector CT imaging of the chest was performed using the standard protocol during bolus administration of intravenous contrast. Multiplanar CT image reconstructions and MIPs were obtained to evaluate the vascular anatomy. RADIATION  DOSE REDUCTION: This exam was performed according to the departmental dose-optimization program which includes automated exposure control, adjustment of the mA and/or kV according to patient size an

## 2021-11-27 NOTE — Progress Notes (Signed)
?  Echocardiogram ?2D Echocardiogram has been performed. ? ?Dakota Ramirez ?11/27/2021, 10:03 AM ?

## 2021-11-28 ENCOUNTER — Inpatient Hospital Stay: Payer: Self-pay

## 2021-11-28 DIAGNOSIS — E1159 Type 2 diabetes mellitus with other circulatory complications: Secondary | ICD-10-CM

## 2021-11-28 DIAGNOSIS — I5021 Acute systolic (congestive) heart failure: Secondary | ICD-10-CM

## 2021-11-28 DIAGNOSIS — I10 Essential (primary) hypertension: Secondary | ICD-10-CM

## 2021-11-28 LAB — CBC
HCT: 39.2 % (ref 39.0–52.0)
Hemoglobin: 12.3 g/dL — ABNORMAL LOW (ref 13.0–17.0)
MCH: 26.2 pg (ref 26.0–34.0)
MCHC: 31.4 g/dL (ref 30.0–36.0)
MCV: 83.6 fL (ref 80.0–100.0)
Platelets: 237 10*3/uL (ref 150–400)
RBC: 4.69 MIL/uL (ref 4.22–5.81)
RDW: 14.2 % (ref 11.5–15.5)
WBC: 6.8 10*3/uL (ref 4.0–10.5)
nRBC: 0 % (ref 0.0–0.2)

## 2021-11-28 LAB — GLUCOSE, CAPILLARY
Glucose-Capillary: 130 mg/dL — ABNORMAL HIGH (ref 70–99)
Glucose-Capillary: 174 mg/dL — ABNORMAL HIGH (ref 70–99)
Glucose-Capillary: 184 mg/dL — ABNORMAL HIGH (ref 70–99)
Glucose-Capillary: 204 mg/dL — ABNORMAL HIGH (ref 70–99)
Glucose-Capillary: 268 mg/dL — ABNORMAL HIGH (ref 70–99)
Glucose-Capillary: 90 mg/dL (ref 70–99)

## 2021-11-28 LAB — LIPID PANEL
Cholesterol: 93 mg/dL (ref 0–200)
HDL: 26 mg/dL — ABNORMAL LOW (ref >40)
LDL Cholesterol: 53 mg/dL (ref 0–99)
Total CHOL/HDL Ratio: 3.6 RATIO
Triglycerides: 72 mg/dL (ref <150)
VLDL: 14 mg/dL (ref 0–40)

## 2021-11-28 LAB — BASIC METABOLIC PANEL
Anion gap: 8 (ref 5–15)
BUN: 16 mg/dL (ref 6–20)
CO2: 32 mmol/L (ref 22–32)
Calcium: 8.3 mg/dL — ABNORMAL LOW (ref 8.9–10.3)
Chloride: 94 mmol/L — ABNORMAL LOW (ref 98–111)
Creatinine, Ser: 0.96 mg/dL (ref 0.61–1.24)
GFR, Estimated: >60 mL/min (ref >60)
Glucose, Bld: 171 mg/dL — ABNORMAL HIGH (ref 70–99)
Potassium: 3.9 mmol/L (ref 3.5–5.1)
Sodium: 134 mmol/L — ABNORMAL LOW (ref 135–145)

## 2021-11-28 LAB — COOXEMETRY PANEL
Carboxyhemoglobin: 1.3 % (ref 0.5–1.5)
Methemoglobin: <0.7 % (ref 0.0–1.5)
O2 Saturation: 43.6 %
Total hemoglobin: 13.2 g/dL (ref 12.0–16.0)

## 2021-11-28 LAB — PROTIME-INR
INR: 1.2 (ref 0.8–1.2)
Prothrombin Time: 15.5 seconds — ABNORMAL HIGH (ref 11.4–15.2)

## 2021-11-28 MED ORDER — CHLORHEXIDINE GLUCONATE CLOTH 2 % EX PADS
6.0000 | MEDICATED_PAD | Freq: Every day | CUTANEOUS | Status: DC
  Administered 2021-11-28 – 2021-12-01 (×4): 6 via TOPICAL

## 2021-11-28 MED ORDER — INSULIN ASPART PROT & ASPART (70-30 MIX) 100 UNIT/ML ~~LOC~~ SUSP
15.0000 [IU] | Freq: Two times a day (BID) | SUBCUTANEOUS | Status: DC
  Administered 2021-11-28 – 2021-12-01 (×7): 15 [IU] via SUBCUTANEOUS
  Filled 2021-11-28: qty 10

## 2021-11-28 MED ORDER — FUROSEMIDE 10 MG/ML IJ SOLN
80.0000 mg | Freq: Once | INTRAMUSCULAR | Status: AC
  Administered 2021-11-28: 80 mg via INTRAVENOUS
  Filled 2021-11-28: qty 8

## 2021-11-28 MED ORDER — ALPRAZOLAM 0.5 MG PO TABS
0.5000 mg | ORAL_TABLET | Freq: Two times a day (BID) | ORAL | Status: DC | PRN
  Administered 2021-11-28 – 2021-11-30 (×3): 0.5 mg via ORAL
  Filled 2021-11-28 (×3): qty 1

## 2021-11-28 MED ORDER — DIGOXIN 125 MCG PO TABS
0.1250 mg | ORAL_TABLET | Freq: Every day | ORAL | Status: DC
  Administered 2021-11-28 – 2021-12-01 (×4): 0.125 mg via ORAL
  Filled 2021-11-28 (×4): qty 1

## 2021-11-28 MED ORDER — SODIUM CHLORIDE 0.9% FLUSH: 10.0000 mL | INTRAVENOUS | Status: DC | PRN

## 2021-11-28 MED ORDER — POTASSIUM CHLORIDE CRYS ER 20 MEQ PO TBCR
40.0000 meq | EXTENDED_RELEASE_TABLET | Freq: Once | ORAL | Status: AC
  Administered 2021-11-28: 40 meq via ORAL
  Filled 2021-11-28: qty 2

## 2021-11-28 MED ORDER — SODIUM CHLORIDE 0.9% FLUSH
10.0000 mL | Freq: Two times a day (BID) | INTRAVENOUS | Status: DC
  Administered 2021-11-28: 20 mL
  Administered 2021-11-29: 30 mL
  Administered 2021-11-29 – 2021-12-01 (×3): 10 mL

## 2021-11-28 MED ORDER — FUROSEMIDE 10 MG/ML IJ SOLN
10.0000 mg/h | INTRAVENOUS | Status: DC
  Administered 2021-11-28 – 2021-11-29 (×2): 10 mg/h via INTRAVENOUS
  Filled 2021-11-28 (×3): qty 20

## 2021-11-28 MED ORDER — FUROSEMIDE 10 MG/ML IJ SOLN
10.0000 mg/h | INTRAVENOUS | Status: DC
  Filled 2021-11-28: qty 20

## 2021-11-28 NOTE — Progress Notes (Signed)
?PROGRESS NOTE ? ? ? GAYLORD SEYDEL  VEL:[id number] DOB: [birth year] DOA: 11/26/2021 ?PCP: Patient, No Pcp Per (Inactive)  ?Narrative: 50/M with history of uncontrolled type 2 diabetes mellitus, hemoglobin A1c of 12.8 in 5/22, hypertension, noncompliance presented to the ED with shortness of breath, abdominal wall and scrotal swelling, symptoms ongoing for 1 to 2 weeks, very poor historian ?-In the ED BNP 398, troponin negative, D-dimer 2.0, blood glucose 306, CTA chest noted cardiomegaly, trace pleural effusion, upper abdominal ascites and anasarca ? ?Subjective: ?-Still feels pretty lousy, scrotal swelling starting to improve, abdomen a little less tight ? ?Assessment & Plan: ? ?New onset systolic CHF ?Biventricular heart failure ?-Echo with EF of 20-25%, global hypokinesis, grade 1 DD, moderately reduced RV ?-Continue IV Lasix he is 4.6 L negative ?-Continue Aldactone, add Entresto after cath if BP, kidney function stable ?-Poor candidate for Farxiga, A1c is 12.4 ?-Cardiology following, plan for right/left heart cath tomorrow ?-Uninsured, needs PCP, medication assistance-TOC/social work consult ? ?Hypoalbuminemia ?-Urinalysis with microalbuminuria only, liver ultrasound with increased echotexture, steatosis/fibrosis ?-Could be secondary to right heart failure, denies history of heavy EtOH use ?-Check coags, hep C ab ? ?Uncontrolled type 2 diabetes mellitus ?-Hemoglobin A1c is 12.4, noncompliant with meds ?-CBGs improving, restarted insulin 70/30 yesterday, seems somewhat agreeable to resume insulin ?-Diabetes coordinator consult appreciated ?-Dietitian consulted ? ?Right foot transmetatarsal amputation ?-Followed by podiatry at [facility], underwent transmetatarsal amputation 7/22 ?-Continue wound care, appreciate wound consult ? ?Hypertension ?-BP is stable ? ? ?DVT prophylaxis: Lovenox ?Code Status: Full code ?Family Communication: Discussed patient in detail, no family at bedside ?Disposition Plan: Home pending  improvement in volume status, cardiac work-up ? ?Consultants:  ?Cardiology ? ?Procedures:  ? ?Antimicrobials:  ? ? ?Objective: ?Vitals:  ? 11/27/21 2005 11/28/21 0008 11/28/21 0416 11/28/21 0801  ?BP: 119/85 120/80 (!) 128/101 121/82  ?Pulse: (!) 113 (!) 109 (!) 105 80  ?Resp: 18 18 15 20   ?Temp: 98.6 ?F (37 ?C) 98 ?F (36.7 ?C) 98.6 ?F (37 ?C)   ?TempSrc: Oral Oral Oral   ?SpO2: 98% 100% 97% 100%  ?Weight:   134.8 kg   ?Height:      ? ? ?Intake/Output Summary (Last 24 hours) at 11/28/2021 1134 ?Last data filed at 11/28/2021 1049 ?Gross per 24 hour  ?Intake 840 ml  ?Output 4225 ml  ?Net -3385 ml  ? ?Filed Weights  ? 11/26/21 2108 11/27/21 0339 11/28/21 0416  ?Weight: (!) 140.2 kg (!) 140 kg 134.8 kg  ? ? ?Examination: ? ?General exam: Disheveled unkempt male sitting up at the edge of the bed, AAOx3 disheveled unkempt male laying in bed, AAOx3 ?CVS: S1-S2, regular rhythm, tachycardic ?Lungs: Decreased breath sounds at the bases otherwise clear ?Abdomen: Firm, distended, abdominal wall edema ?GU: Scrotal edema ?Extremities: 2+ edema, right foot transmetatarsal amputation with dry crusted wound  ?Psychiatry: Poor insight ? ?Data Reviewed:  ? ?CBC: ?Recent Labs  ?Lab 11/26/21 ?1314 11/28/21 ?0148  ?WBC 6.1 6.8  ?NEUTROABS 3.5  --   ?HGB 12.9* 12.3*  ?HCT 41.8 39.2  ?MCV 85.0 83.6  ?PLT 286 237  ? ?Basic Metabolic Panel: ?Recent Labs  ?Lab 11/26/21 ?1314 11/27/21 ?0239 11/28/21 ?0148  ?NA 132* 136 134*  ?K 4.0 3.9 3.9  ?CL 98 102 94*  ?CO2 27 28 32  ?GLUCOSE 306* 217* 171*  ?BUN 22* 18 16  ?CREATININE 1.07 0.95 0.96  ?CALCIUM 8.4* 8.3* 8.3*  ? ?GFR: ?Estimated Creatinine Clearance: 130.5 mL/min (by C-G formula based on SCr of 0.96  mg/dL). ?Liver Function Tests: ?Recent Labs  ?Lab 11/26/21 ?1314  ?AST 33  ?ALT 21  ?ALKPHOS 114  ?BILITOT 0.5  ?PROT 8.0  ?ALBUMIN 2.8*  ? ?No results for input(s): LIPASE, AMYLASE in the last 168 hours. ?No results for input(s): AMMONIA in the last 168 hours. ?Coagulation Profile: ?No results  for input(s): INR, PROTIME in the last 168 hours. ?Cardiac Enzymes: ?No results for input(s): CKTOTAL, CKMB, CKMBINDEX, TROPONINI in the last 168 hours. ?BNP (last 3 results) ?No results for input(s): PROBNP in the last 8760 hours. ?HbA1C: ?Recent Labs  ?  11/26/21 ?1732  ?HGBA1C 12.4*  ? ?CBG: ?Recent Labs  ?Lab 11/27/21 ?2018 11/28/21 ?0006 11/28/21 ?1540 11/28/21 ?0867 11/28/21 ?1123  ?GLUCAP 188* 268* 204* 130* 90  ? ?Lipid Profile: ?Recent Labs  ?  11/28/21 ?0148  ?CHOL 93  ?HDL 26*  ?LDLCALC 53  ?TRIG 72  ?CHOLHDL 3.6  ? ?Thyroid Function Tests: ?No results for input(s): TSH, T4TOTAL, FREET4, T3FREE, THYROIDAB in the last 72 hours. ?Anemia Panel: ?No results for input(s): VITAMINB12, FOLATE, FERRITIN, TIBC, IRON, RETICCTPCT in the last 72 hours. ?Urine analysis: ?   ?Component Value Date/Time  ? COLORURINE YELLOW 11/26/2021 1423  ? APPEARANCEUR CLEAR 11/26/2021 1423  ? LABSPEC 1.020 11/26/2021 1423  ? PHURINE 6.0 11/26/2021 1423  ? GLUCOSEU 250 (A) 11/26/2021 1423  ? HGBUR TRACE (A) 11/26/2021 1423  ? BILIRUBINUR NEGATIVE 11/26/2021 1423  ? KETONESUR NEGATIVE 11/26/2021 1423  ? PROTEINUR 30 (A) 11/26/2021 1423  ? NITRITE NEGATIVE 11/26/2021 1423  ? LEUKOCYTESUR NEGATIVE 11/26/2021 1423  ? ?Sepsis Labs: ?@[email or web address] (procalcitonin:4,lacticidven:4) ? ?)No results found for this or any previous visit (from the past 240 hour(s)).  ? ?Radiology Studies: ?DG Chest 2 View ? ?Result Date: 11/26/2021 ?CLINICAL DATA:  Scrotal edema, fluid overload, shortness of breath EXAM: CHEST - 2 VIEW COMPARISON:  03/16/2019. FINDINGS: There is significant interval increase in transverse diameter of heart. Possibility of pericardial effusion is not excluded. Central pulmonary vessels are prominent without signs of alveolar pulmonary edema. There are small linear densities in right lower lung fields. There is no focal pulmonary consolidation. There is blunting of right lateral costophrenic angle. There is no pneumothorax. IMPRESSION:  There is significant interval increase in transverse diameter of heart. Possibility of pericardial effusion is not excluded. Central pulmonary vessels are prominent without signs of alveolar pulmonary edema. There are linear densities in right lower lung fields suggesting subsegmental atelectasis. Blunting of right lateral CP angle suggests small right pleural effusion. Electronically Signed   By: Ernie Avena M.D.   On: 11/26/2021 13:30  ? ?CT Angio Chest PE W and/or Wo Contrast ? ?Result Date: 11/26/2021 ?CLINICAL DATA:  Shortness of breath.  Right-sided chest swelling. EXAM: CT ANGIOGRAPHY CHEST WITH CONTRAST TECHNIQUE: Multidetector CT imaging of the chest was performed using the standard protocol during bolus administration of intravenous contrast. Multiplanar CT image reconstructions and MIPs were obtained to evaluate the vascular anatomy. RADIATION DOSE REDUCTION: This exam was performed according to the departmental dose-optimization program which includes automated exposure control, adjustment of the mA and/or kV according to patient size and/or use of iterative reconstruction technique. CONTRAST:  OMNIPAQUE IOHEXOL 350 MG/ML SOLN COMPARISON:  Chest x-ray from same day. CT chest dated May 27, 2018. FINDINGS: Cardiovascular: Satisfactory opacification of the pulmonary arteries to the segmental level. No evidence of pulmonary embolism. New cardiomegaly. No pericardial effusion. No thoracic aortic aneurysm or dissection. Mediastinum/Nodes: No enlarged mediastinal, hilar, or axillary lymph nodes. Thyroid gland, trachea, and  esophagus demonstrate no significant findings. Lungs/Pleura: Trace right pleural effusion. Minimal right lower lobe subsegmental atelectasis. No consolidation or pneumothorax. Upper Abdomen: Moderate upper abdominal ascites. Musculoskeletal: Anasarca asymmetrically involving the right chest wall. No acute or significant osseous findings. Multiple old healed left posterior rib  fractures. Review of the MIP images confirms the above findings. IMPRESSION: 1. No evidence of pulmonary embolism. 2. New cardiomegaly with trace right pleural effusion, moderate upper abdominal ascites, and a

## 2021-11-28 NOTE — Progress Notes (Addendum)
Pt very anxious and very frustrated. Saying that he will leave AMA and he can't stand it to be in here. Asked MD PRN for anxiety.  ?

## 2021-11-28 NOTE — Consult Note (Addendum)
Advanced Heart Failure Team Consult Note   Primary Physician: Patient, No Pcp Per (Inactive) PCP-Cardiologist:  None  Reason for Consultation: Acute biventricular CHF  HPI:    Dakota Ramirez is seen today for evaluation of acute biventricular CHF at the request of Dr. Jomarie Longs with [facility]. 51 y.o. 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 No prior cardiac history.   Presented to ED in [facility] on 11/26/21 with complaints of scrotal edema, shortness of breath and abdominal distension. Gradual progression of symptoms over the last few weeks. CTA chest negative for PE, incidentally showed evidence of abdominal ascites and anasarca. BNP 398, HS troponin 13>14, Scr 1.07, Hgb 12.9, WBC 6.1. ECG with sinus tachycardia 111 bpm, lateral infarct. He was given 40 mg lasix IV and transferred to [facility] for admission and workup for new CHF. Diabetes uncontrolled, A1c 12.4 this admit.  Echo EF 20-25%, regional WMA, moderately dilated LV, RV moderately reduced, RVSP 40 mmHg, mild to moderate MR, dilated IVC with estimated RAP 15 mmHg  Cardiology consulted. He was started on IV lasix 40 mg BID. GDMT - started on spiro 25 mg daily. Has diuresed 10 lb so far.  He feels miserable. Has testicular pain d/t edema. Ongoing shortness of breath and abdominal bloating. He is frustrated and wants to be home with his dog.  Currently unemployed. Lives in a camper behind his father's house with his dog.   Denies ETOH, tobacco or drug use.  Family history of HF in his mother.    Review of Systems: [y] = yes, [ ]  = no   General: Weight gain [Y]; Weight loss [ ] ; Anorexia [ ] ; Fatigue [ ] ; Fever [ ] ; Chills [ ] ; Weakness [ ]   Cardiac: Chest pain/pressure [ ] ; Resting SOB [Y]; Exertional SOB [Y]; Orthopnea [Y ]; Pedal Edema [ ] ; Palpitations [ ] ; Syncope [ ] ; Presyncope [ ] ; Paroxysmal nocturnal dyspnea[ ]   Pulmonary: Cough [ ] ; Wheezing[ ] ; Hemoptysis[  ]; Sputum [ ] ; Snoring [ ]   GI: Vomiting[ ] ; Dysphagia[ ] ; Melena[ ] ; Hematochezia [ ] ; Heartburn[ ] ; Abdominal pain [ ] ; Constipation [ ] ; Diarrhea [ ] ; BRBPR [ ]   GU: Hematuria[ ] ; Dysuria [ ] ; Nocturia[ ]   Vascular: Pain in legs with walking [ ] ; Pain in feet with lying flat [ ] ; Non-healing sores [Y]; Stroke [ ] ; TIA [ ] ; Slurred speech [ ] ;  Neuro: Headaches[ ] ; Vertigo[ ] ; Seizures[ ] ; Paresthesias[ ] ;Blurred vision [ ] ; Diplopia [ ] ; Vision changes [ ]   Ortho/Skin: Arthritis [ ] ; Joint pain [ ] ; Muscle pain [ ] ; Joint swelling [ ] ; Back Pain [ ] ; Rash [ ]   Psych: Depression[ ] ; Anxiety[ ]   Heme: Bleeding problems [ ] ; Clotting disorders [ ] ; Anemia [ ]   Endocrine: Diabetes [Y]; Thyroid dysfunction[ ]   Home Medications Prior to Admission medications   Medication Sig Start Date End Date Taking? Authorizing Provider  ibuprofen (ADVIL) 200 MG tablet Take 800 mg by mouth every 6 (six) hours as needed for mild pain.   Yes [provider]  lisinopril (ZESTRIL) 20 MG tablet Take 40 mg by mouth daily.   Yes [provider]    Past Medical History: Past Medical History:  Diagnosis Date   Diabetes mellitus without complication (HCC)    Hypertension     Past Surgical History: Past Surgical History:  Procedure Laterality Date   BACK SURGERY     CHOLECYSTECTOMY  TOE AMPUTATION Right    all toes on RT foot    Family History: Family History  Problem Relation Age of Onset   Diabetes Mother    Hypertension Mother     Social History: Social History   Socioeconomic History   Marital status: Single    Spouse name: Not on file   Number of children: Not on file   Years of education: Not on file   Highest education level: Not on file  Occupational History   Not on file  Tobacco Use   Smoking status: Never   Smokeless tobacco: Never  Vaping Use   Vaping Use: Never used  Substance and Sexual Activity   Alcohol use: Never   Drug use: Never   Sexual  activity: Not on file  Other Topics Concern   Not on file  Social History Narrative   Not on file   Social Determinants of Health   Financial Resource Strain: Not on file  Food Insecurity: Not on file  Transportation Needs: Not on file  Physical Activity: Not on file  Stress: Not on file  Social Connections: Not on file    Allergies:  No Known Allergies  Objective:    Vital Signs:   Temp:  [97.9 F (36.6 C)-98.6 F (37 C)] 98.6 F (37 C) (04/18 0416) Pulse Rate:  [80-116] 80 (04/18 0801) Resp:  [14-20] 20 (04/18 0801) BP: (119-136)/(80-101) 121/82 (04/18 0801) SpO2:  [97 %-100 %] 100 % (04/18 0801) Weight:  [134.8 kg] 134.8 kg (04/18 0416) Last BM Date : 11/28/21  Weight change: Filed Weights   11/26/21 2108 11/27/21 0339 11/28/21 0416  Weight: (!) 140.2 kg (!) 140 kg 134.8 kg    Intake/Output:   Intake/Output Summary (Last 24 hours) at 11/28/2021 1103 Last data filed at 11/28/2021 1049 Gross per 24 hour  Intake 960 ml  Output 5025 ml  Net -4065 ml      Physical Exam    General:  No distress. Lying in bed. HEENT: poor dentition Neck: supple. JVP to ear. Carotids 2+ bilat; no bruits.  Cor: PMI nondisplaced. Regular rate & rhythm, tachy. No rubs, gallops or murmurs. Lungs: diminished in bases Abdomen: soft, nontender, + distended. No hepatosplenomegaly.  Extremities: no cyanosis, clubbing, rash, 3+ edema to waist, multiple excoriations on upper and lower extremities (? Bites) Neuro: alert & orientedx3, cranial nerves grossly intact. moves all 4 extremities w/o difficulty. Affect pleasant   Telemetry   Sinus tach 100s (personally reviewed)  EKG    Sinus tach 111 bpm, lateral infarct  Labs   Basic Metabolic Panel: Recent Labs  Lab 11/26/21 1314 11/27/21 0239 11/28/21 0148  NA 132* 136 134*  K 4.0 3.9 3.9  CL 98 102 94*  CO2 27 28 32  GLUCOSE 306* 217* 171*  BUN 22* 18 16  CREATININE 1.07 0.95 0.96  CALCIUM 8.4* 8.3* 8.3*    Liver  Function Tests: Recent Labs  Lab 11/26/21 1314  AST 33  ALT 21  ALKPHOS 114  BILITOT 0.5  PROT 8.0  ALBUMIN 2.8*   No results for input(s): LIPASE, AMYLASE in the last 168 hours. No results for input(s): AMMONIA in the last 168 hours.  CBC: Recent Labs  Lab 11/26/21 1314 11/28/21 0148  WBC 6.1 6.8  NEUTROABS 3.5  --   HGB 12.9* 12.3*  HCT 41.8 39.2  MCV 85.0 83.6  PLT 286 237    Cardiac Enzymes: No results for input(s): CKTOTAL, CKMB, CKMBINDEX, TROPONINI in the  last 168 hours.  BNP: BNP (last 3 results) Recent Labs    11/26/21 1314  BNP 398.2*    ProBNP (last 3 results) No results for input(s): PROBNP in the last 8760 hours.   CBG: Recent Labs  Lab 11/27/21 1533 11/27/21 2018 11/28/21 0006 11/28/21 0415 11/28/21 0744  GLUCAP 229* 188* 268* 204* 130*    Coagulation Studies: No results for input(s): LABPROT, INR in the last 72 hours.   Imaging   US Abdomen Limited RUQ (LIVER/GB)  Result Date: 11/27/2021 CLINICAL DATA:  Inpatient.  Liver disease.  Cholecystectomy. EXAM: ULTRASOUND ABDOMEN LIMITED RIGHT UPPER QUADRANT COMPARISON:  05/05/2018 CT abdomen/pelvis FINDINGS: Gallbladder: Surgically absent. Common bile duct: Diameter: 5 mm Liver: Diffusely mildly echogenic liver parenchyma. No liver masses, noting decreased sensitivity in the setting of an echogenic liver. Questionable fine liver surface irregularity. Portal vein is patent on color Doppler imaging with normal direction of blood flow towards the liver. Other: None. IMPRESSION: 1. No biliary ductal dilatation.  Status post cholecystectomy. 2. Nonspecific diffusely mildly echogenic liver parenchyma, which can be due to hepatic steatosis and/or fibrosis. Questionable fine liver surface irregularity, cannot exclude cirrhosis. Suggest correlation with liver function tests. Consider outpatient hepatic elastography for further liver fibrosis risk stratification, as clinically warranted. Electronically  Signed   By: Delbert Phenix M.D.   On: 11/27/2021 21:59     Medications:     Current Medications:  enoxaparin (LOVENOX) injection  70 mg Subcutaneous Q24H   furosemide  40 mg Intravenous BID   insulin aspart  0-9 Units Subcutaneous Q4H   insulin aspart protamine- aspart  15 Units Subcutaneous BID WC   potassium chloride  40 mEq Oral Once   spironolactone  25 mg Oral Daily    Infusions:     Patient Profile   51 y.o. male with history of uncontrolled DM, HTN, osteomyelitis s/p R TMA, noncompliance with medical therapy. Admitted with new biventricular heart failure.  Assessment/Plan   Acute biventricular CHF: -New diagnosis this admit -Suspect ICM. Has multiple risk factors for CAD, WMA on echo, and abnormal ECG. Reports hx CHF in his mother. No hx alcohol or drug abuse. -Echo EF 20-25%, regional WMA, moderately dilated LV, RV moderately reduced, RVSP 40 mmHg, mild to moderate MR, dilated IVC with estimated RAP 15 mmHg -Will need R/LHC once diuresed. cMRI if no obstructive CAD to explain cardiomyopathy. -NYHA III. Massively volume overloaded. Down 10 lb with lasix 40 mg IV BID. Given 80 mg lasix IV once and start lasix gtt at 10 mg/hr -Place PICC line. Follow CVP and CO-OX. -Add digoxin 0.125 mg daily -Continue spiro 25 mg daily -ARNi prior to discharge -No SGLT2i with uncontrolled DM and concern for hygiene -Place UNNA boots -Low albumin state likely contributing to some of his LE edema.  2. Uncontrolled DM: - A1c 12.4% - Has not been compliant with medications  3. Hx osteomyelitis s/p right TMA: - Amputation stump has been slow to heal d/t poor diabetes control and hygiene -Follows with Podiatry at [facility]  4. HTN: - BP stable. Medication adjustments as above.  5. Tick bite: -Live tick pulled off patient's abdomen last night  6. Hypoalbuminemia -Liver US with echogenic parenchyma, ? Hepatic steatosis or fibrosis. Cannot exclude cirrhosis -TRH further evaluating -?  Secondary to RV failure  SDOH:  -Uncertain if he has medicaid. Will review with HF CSW. -Medication compliance an issue. Would benefit from Paramedicine at discharge.  -Lives in a camper behind father's house. Unemployed and has  limited social support.   Length of Stay: 2  FINCH, LINDSAY N, PA-C  11/28/2021, 11:03 AM  Advanced Heart Failure Team Pager [phone] (M-F; 7a - 5p)  Please contact [facility] for night-coverage after hours (4p -7a ) and weekends on [email or web address]   Patient seen and examined with the above-signed Advanced Practice Provider and/or Housestaff. I personally reviewed laboratory data, imaging studies and relevant notes. I independently examined the patient and formulated the important aspects of the plan. I have edited the note to reflect any of my changes or salient points. I have personally discussed the plan with the patient and/or family.  51 y/o male with poorly-controlled DM2, PAD s/p R TMA, HTN admitted with new onset systolic HF with massive volume overload.   Feels miserable. Bloated. + SOB. Denies CP.   Lives in trailer with his dog.   ECG with high lateral Qs (new from previous) - on 2 consecutive ECGs so probably not lead malpositioning  Hstrop 14,13  Echo EF 25-30% with Inferior, inferoseptal and antero-apical AK/thinning  General:  Chronically ill appearing. No resp difficulty HEENT: normal Neck: supple. JVP to jaw Carotids 2+ bilat; no bruits. No lymphadenopathy or thryomegaly appreciated. Cor: PMI nondisplaced. Regular rate & rhythm. No rubs, gallops or murmurs. Lungs: clear Abdomen: soft, nontender, nondistended. No hepatosplenomegaly. No bruits or masses. Good bowel sounds. Extremities: no cyanosis, clubbing, rash, 3+ edema to waist  + excoriations/bug bites Neuro: alert & orientedx3, cranial nerves grossly intact. moves all 4 extremities w/o difficulty. Affect pleasant  He is massively volume overloaded with probable low output. ECG and  echo suggests clear ischemic etiology. 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   PICC being placed. Will check co-ox ad CVP. Will need further diuresis prior to cath. Primary team managing DM2.   Arvilla Meres, MD  9:39 PM

## 2021-11-28 NOTE — Progress Notes (Addendum)
HF CSW reached out to [employer] to see about getting he patient screened for Medicaid. [employer], reported that the patient account was referred to [employer] on 11/27/2021 and they will be screening this patient for Medicaid. ? ? ?CSW will continue to follow throughout discharge. ? ?[place], MSW, LCSW ?[phone] ?Heart Failure Social Worker  ?

## 2021-11-28 NOTE — Progress Notes (Signed)
Peripherally Inserted Central Catheter Placement ? ?The IV Nurse has discussed with the patient and/or persons authorized to consent for the patient, the purpose of this procedure and the potential benefits and risks involved with this procedure.  The benefits include less needle sticks, lab draws from the catheter, and the patient may be discharged home with the catheter. Risks include, but not limited to, infection, bleeding, blood clot (thrombus formation), and puncture of an artery; nerve damage and irregular heartbeat and possibility to perform a PICC exchange if needed/ordered by physician.  Alternatives to this procedure were also discussed.  Bard Power PICC patient education guide, fact sheet on infection prevention and patient information card has been provided to patient /or left at bedside.   ? ?PICC Placement Documentation  ?PICC Double Lumen [id number] Right Basilic 43 cm 2 cm (Active)  ?Indication for Insertion or Continuance of Line Chronic illness with exacerbations (CF, Sickle Cell, etc.) 11/28/21 1630  ?Exposed Catheter (cm) 2 cm 11/28/21 1630  ?Site Assessment Clean, Dry, Intact 11/28/21 1630  ?Lumen #1 Status Flushed;Saline locked;Blood return noted 11/28/21 1630  ?Lumen #2 Status Flushed;Saline locked;Blood return noted 11/28/21 1630  ?Dressing Type Securing device;Transparent 11/28/21 1630  ?Dressing Status Antimicrobial disc in place;Clean, Dry, Intact 11/28/21 1630  ?Dressing Intervention New dressing 11/28/21 1630  ?Dressing Change Due 12/05/21 11/28/21 1630  ? ? ? ? ? ?Shon Hale ?11/28/2021, 4:34 PM ? ?

## 2021-11-28 NOTE — Progress Notes (Addendum)
? ?Progress Note ? ?Patient Name: Dakota Ramirez ?Date of Encounter: 11/28/2021 ? ?[facility] Cardiologist: None  ? ?Subjective  ? ?Feeling slightly better, has been diuresing well.  ? ?Inpatient Medications  ?  ?Scheduled Meds: ? enoxaparin (LOVENOX) injection  70 mg Subcutaneous Q24H  ? furosemide  40 mg Intravenous BID  ? insulin aspart  0-9 Units Subcutaneous Q4H  ? insulin aspart protamine- aspart  15 Units Subcutaneous BID WC  ? spironolactone  25 mg Oral Daily  ? ?Continuous Infusions: ? ?PRN Meds: ?acetaminophen **OR** acetaminophen, ondansetron (ZOFRAN) IV  ? ?Vital Signs  ?  ?Vitals:  ? 11/27/21 2005 11/28/21 0008 11/28/21 0416 11/28/21 0801  ?BP: 119/85 120/80 (!) 128/101 121/82  ?Pulse: (!) 113 (!) 109 (!) 105 80  ?Resp: 18 18 15 20   ?Temp: 98.6 ?F (37 ?C) 98 ?F (36.7 ?C) 98.6 ?F (37 ?C)   ?TempSrc: Oral Oral Oral   ?SpO2: 98% 100% 97% 100%  ?Weight:   134.8 kg   ?Height:      ? ? ?Intake/Output Summary (Last 24 hours) at 11/28/2021 1023 ?Last data filed at 11/28/2021 0900 ?Gross per 24 hour  ?Intake 960 ml  ?Output 4725 ml  ?Net -3765 ml  ? ? ?  11/28/2021  ?  4:16 AM 11/27/2021  ?  3:39 AM 11/26/2021  ?  9:08 PM  ?Last 3 Weights  ?Weight (lbs) 297 lb 1.6 oz 308 lb 11.2 oz 309 lb  ?Weight (kg) 134.764 kg 140.025 kg 140.161 kg  ?   ? ?Telemetry  ?  ?Sinus tachycardia, rates 100 - Personally Reviewed ? ?ECG  ?  ?No new tracing ? ?Physical Exam  ? ?GEN: Disheveled, younger male, laying comfortable in bed. ?Neck: + JVD ?Cardiac: Tachy, no murmurs, rubs, or gallops.  ?Respiratory: Clear to auscultation bilaterally. ?GI: Soft, nontender, non-distended  ?MS: 2+ bilateral LE edema, right TMA wrapped, scrotal edema ?Neuro:  Nonfocal  ?Psych: Normal affect  ? ?Labs  ?  ?High Sensitivity Troponin:   ?Recent Labs  ?Lab 11/26/21 ?1314 11/26/21 ?1617  ?TROPONINIHS 13 14  ?   ?Chemistry ?Recent Labs  ?Lab 11/26/21 ?1314 11/27/21 ?0239 11/28/21 ?0148  ?NA 132* 136 134*  ?K 4.0 3.9 3.9  ?CL 98 102 94*  ?CO2 27 28 32   ?GLUCOSE 306* 217* 171*  ?BUN 22* 18 16  ?CREATININE 1.07 0.95 0.96  ?CALCIUM 8.4* 8.3* 8.3*  ?PROT 8.0  --   --   ?ALBUMIN 2.8*  --   --   ?AST 33  --   --   ?ALT 21  --   --   ?ALKPHOS 114  --   --   ?BILITOT 0.5  --   --   ?GFRNONAA >60 >60 >60  ?ANIONGAP 7 6 8   ?  ?Lipids  ?Recent Labs  ?Lab 11/28/21 ?0148  ?CHOL 93  ?TRIG 72  ?HDL 26*  ?LDLCALC 53  ?CHOLHDL 3.6  ?  ?Hematology ?Recent Labs  ?Lab 11/26/21 ?1314 11/28/21 ?0148  ?WBC 6.1 6.8  ?RBC 4.92 4.69  ?HGB 12.9* 12.3*  ?HCT 41.8 39.2  ?MCV 85.0 83.6  ?MCH 26.2 26.2  ?MCHC 30.9 31.4  ?RDW 14.1 14.2  ?PLT 286 237  ? ?Thyroid No results for input(s): TSH, FREET4 in the last 168 hours.  ?BNP ?Recent Labs  ?Lab 11/26/21 ?1314  ?BNP 398.2*  ?  ?DDimer  ?Recent Labs  ?Lab 11/26/21 ?1314  ?DDIMER 2.04*  ?  ? ?Radiology  ?  ?DG  Chest 2 View ? ?Result Date: 11/26/2021 ?CLINICAL DATA:  Scrotal edema, fluid overload, shortness of breath EXAM: CHEST - 2 VIEW COMPARISON:  03/16/2019. FINDINGS: There is significant interval increase in transverse diameter of heart. Possibility of pericardial effusion is not excluded. Central pulmonary vessels are prominent without signs of alveolar pulmonary edema. There are small linear densities in right lower lung fields. There is no focal pulmonary consolidation. There is blunting of right lateral costophrenic angle. There is no pneumothorax. IMPRESSION: There is significant interval increase in transverse diameter of heart. Possibility of pericardial effusion is not excluded. Central pulmonary vessels are prominent without signs of alveolar pulmonary edema. There are linear densities in right lower lung fields suggesting subsegmental atelectasis. Blunting of right lateral CP angle suggests small right pleural effusion. Electronically Signed   By: Ernie Avena M.D.   On: 11/26/2021 13:30  ? ?CT Angio Chest PE W and/or Wo Contrast ? ?Result Date: 11/26/2021 ?CLINICAL DATA:  Shortness of breath.  Right-sided chest swelling. EXAM:  CT ANGIOGRAPHY CHEST WITH CONTRAST TECHNIQUE: Multidetector CT imaging of the chest was performed using the standard protocol during bolus administration of intravenous contrast. Multiplanar CT image reconstructions and MIPs were obtained to evaluate the vascular anatomy. RADIATION DOSE REDUCTION: This exam was performed according to the departmental dose-optimization program which includes automated exposure control, adjustment of the mA and/or kV according to patient size and/or use of iterative reconstruction technique. CONTRAST:  OMNIPAQUE IOHEXOL 350 MG/ML SOLN COMPARISON:  Chest x-ray from same day. CT chest dated May 27, 2018. FINDINGS: Cardiovascular: Satisfactory opacification of the pulmonary arteries to the segmental level. No evidence of pulmonary embolism. New cardiomegaly. No pericardial effusion. No thoracic aortic aneurysm or dissection. Mediastinum/Nodes: No enlarged mediastinal, hilar, or axillary lymph nodes. Thyroid gland, trachea, and esophagus demonstrate no significant findings. Lungs/Pleura: Trace right pleural effusion. Minimal right lower lobe subsegmental atelectasis. No consolidation or pneumothorax. Upper Abdomen: Moderate upper abdominal ascites. Musculoskeletal: Anasarca asymmetrically involving the right chest wall. No acute or significant osseous findings. Multiple old healed left posterior rib fractures. Review of the MIP images confirms the above findings. IMPRESSION: 1. No evidence of pulmonary embolism. 2. New cardiomegaly with trace right pleural effusion, moderate upper abdominal ascites, and anasarca. Electronically Signed   By: Obie Dredge M.D.   On: 11/26/2021 14:36  ? ?ECHOCARDIOGRAM COMPLETE ? ?Result Date: 11/27/2021 ?   ECHOCARDIOGRAM REPORT   Patient Name:   Dakota Ramirez Date of Exam: 11/27/2021 Medical Rec #:  [id number]    Height:       71.0 in Accession #:    [id number]   Weight:       308.7 lb Date of Birth:  1971-07-13    BSA:          2.536 m? Patient  Age:    51 years     BP:           122/83 mmHg Patient Gender: M            HR:           108 bpm. Exam Location:  Inpatient Procedure: 2D Echo, 3D Echo, Cardiac Doppler, Color Doppler and Intracardiac            Opacification Agent Indications:    CHF-Acute Systolic I50.21  History:        Patient has no prior history of Echocardiogram examinations.                 Risk Factors:Hypertension  and Diabetes.  Sonographer:    Eulah Pont RDCS Referring Phys: [id number] VASUNDHRA RATHORE IMPRESSIONS  1. Left ventricular ejection fraction, by estimation, is 20 to 25%. The left ventricle has severely decreased function. The left ventricle demonstrates global hypokinesis. The left ventricular internal cavity size was moderately dilated. Left ventricular diastolic parameters are consistent with Grade I diastolic dysfunction (impaired relaxation). No LV thrombus noted.  2. Right ventricular systolic function is moderately reduced. The right ventricular size is mildly enlarged. There is mildly elevated pulmonary artery systolic pressure. The estimated right ventricular systolic pressure is 39.8 mmHg.  3. Left atrial size was moderately dilated.  4. Right atrial size was mild to moderately dilated.  5. The mitral valve is normal in structure. Mild to moderate mitral valve regurgitation. No evidence of mitral stenosis.  6. The aortic valve is tricuspid. There is mild calcification of the aortic valve. Aortic valve regurgitation is not visualized. No aortic stenosis is present.  7. The inferior vena cava is dilated in size with <50% respiratory variability, suggesting right atrial pressure of 15 mmHg. FINDINGS  Left Ventricle: Left ventricular ejection fraction, by estimation, is 20 to 25%. The left ventricle has severely decreased function. The left ventricle demonstrates global hypokinesis. Definity contrast agent was given IV to delineate the left ventricular endocardial borders. The left ventricular internal cavity size was  moderately dilated. There is no left ventricular hypertrophy. Left ventricular diastolic parameters are consistent with Grade I diastolic dysfunction (impaired relaxation). Right Ventricle: The right ventricul

## 2021-11-28 NOTE — Progress Notes (Signed)
Heart Failure Navigator Progress Note  Assessed for Heart & Vascular TOC clinic readiness.  Patient does not meet criteria due to consult with Advance Heart Failure Team.     Dakota Ramirez, BSN, RN Heart Failure Nurse Navigator Secure Chat Only   

## 2021-11-28 NOTE — TOC Initial Note (Addendum)
Transition of Care (TOC) - Initial/Assessment Note  ? ? ?Patient Details  ?Name: Dakota Ramirez ?MRN: [id number] ?Date of Birth: Feb 25, 1971 ? ?Transition of Care [facility]) CM/SW Contact:    ?Mariea Stable Davene Costain, RN ?Phone Number: [phone] ?11/28/2021, 1:11 PM ? ?Clinical Narrative:                 ? ?HF TOC CM spoke to pt and states he lives in camper behind his father's home. States he was going to [facility] Dept for care. Appt scheduled at [facility] Dept with Dr Lyn Records on 12/07/2021 at 1015 am, bring your ID, proof of income and discharge paperwork with list of medications. Pt reports having a walker at home. Will use MATCH and HF funds for medications. Will have meds come from [facility] pharmacy at dc.  ? ? ?Expected Discharge Plan: Home/Self Care ?Barriers to Discharge: Continued Medical Work up ? ? ?Patient Goals and CMS Choice ?Patient states their goals for this hospitalization and ongoing recovery are:: wants to go home ?CMS Medicare.gov Compare Post Acute Care list provided to:: Patient ?Choice offered to / list presented to : Patient ? ?Expected Discharge Plan and Services ?Expected Discharge Plan: Home/Self Care ?  ?Discharge Planning Services: CM Consult ?  ?Living arrangements for the past 2 months: Mobile Home ?                ?  ?  ?  ?  ?  ?  ?  ?  ?  ?  ? ?Prior Living Arrangements/Services ?Living arrangements for the past 2 months: Mobile Home ?Lives with:: Self ?Patient language and need for interpreter reviewed:: Yes ?       ?Need for Family Participation in Patient Care: No (Comment) ?Care giver support system in place?: No (comment) ?  ?Criminal Activity/Legal Involvement Pertinent to Current Situation/Hospitalization: Yes - Comment as needed ? ?Activities of Daily Living ?  ?  ? ?Permission Sought/Granted ?Permission sought to share information with : Case Manager, PCP, Family Supports ?Permission granted to share information with : Yes, Verbal Permission Granted ?   ?   ?    ?   ? ?Emotional Assessment ?Appearance:: Appears older than stated age ?Attitude/Demeanor/Rapport: Engaged ?Affect (typically observed): Accepting ?Orientation: : Oriented to Self, Oriented to Place, Oriented to  Time, Oriented to Situation ?  ?Psych Involvement: No (comment) ? ?Admission diagnosis:  CHF (congestive heart failure) (HCC) [I50.9] ?Acute congestive heart failure, unspecified heart failure type (HCC) [I50.9] ?Patient Active Problem List  ? Diagnosis Date Noted  ? Tick bite of abdomen 11/27/2021  ? CHF (congestive heart failure) (HCC) 11/26/2021  ? Type 2 diabetes mellitus (HCC) 11/26/2021  ? Essential hypertension 11/26/2021  ? ?PCP:  Patient, No Pcp Per (Inactive) ?Pharmacy:   ?[facility] [address] [place] - [address] ?[address] ?[place] [address] ?Phone: [phone] Fax: [phone] ? ? ? ? ?Social Determinants of Health (SDOH) Interventions ?  ? ?Readmission Risk Interventions ?   ? View : No data to display.  ?  ?  ?  ? ? ? ?

## 2021-11-29 ENCOUNTER — Encounter (HOSPITAL_COMMUNITY)
Admission: EM | Disposition: A | Discharge status: Home / Self Care | Payer: Self-pay | Source: Home / Self Care | Attending: Internal Medicine

## 2021-11-29 DIAGNOSIS — E669 Obesity, unspecified: Secondary | ICD-10-CM | POA: Diagnosis present

## 2021-11-29 DIAGNOSIS — I739 Peripheral vascular disease, unspecified: Secondary | ICD-10-CM

## 2021-11-29 DIAGNOSIS — E871 Hypo-osmolality and hyponatremia: Secondary | ICD-10-CM

## 2021-11-29 LAB — COMPREHENSIVE METABOLIC PANEL
ALT: 17 U/L (ref 0–44)
AST: 26 U/L (ref 15–41)
Albumin: 2.5 g/dL — ABNORMAL LOW (ref 3.5–5.0)
Alkaline Phosphatase: 102 U/L (ref 38–126)
Anion gap: 7 (ref 5–15)
BUN: 11 mg/dL (ref 6–20)
CO2: 34 mmol/L — ABNORMAL HIGH (ref 22–32)
Calcium: 8.4 mg/dL — ABNORMAL LOW (ref 8.9–10.3)
Chloride: 93 mmol/L — ABNORMAL LOW (ref 98–111)
Creatinine, Ser: 0.94 mg/dL (ref 0.61–1.24)
GFR, Estimated: >60 mL/min (ref >60)
Glucose, Bld: 166 mg/dL — ABNORMAL HIGH (ref 70–99)
Potassium: 3.6 mmol/L (ref 3.5–5.1)
Sodium: 134 mmol/L — ABNORMAL LOW (ref 135–145)
Total Bilirubin: 0.7 mg/dL (ref 0.3–1.2)
Total Protein: 7.5 g/dL (ref 6.5–8.1)

## 2021-11-29 LAB — GLUCOSE, CAPILLARY
Glucose-Capillary: 129 mg/dL — ABNORMAL HIGH (ref 70–99)
Glucose-Capillary: 146 mg/dL — ABNORMAL HIGH (ref 70–99)
Glucose-Capillary: 154 mg/dL — ABNORMAL HIGH (ref 70–99)
Glucose-Capillary: 172 mg/dL — ABNORMAL HIGH (ref 70–99)
Glucose-Capillary: 177 mg/dL — ABNORMAL HIGH (ref 70–99)
Glucose-Capillary: 364 mg/dL — ABNORMAL HIGH (ref 70–99)

## 2021-11-29 LAB — COOXEMETRY PANEL
Carboxyhemoglobin: 1.6 % — ABNORMAL HIGH (ref 0.5–1.5)
Methemoglobin: <0.7 % (ref 0.0–1.5)
O2 Saturation: 65.9 %
Total hemoglobin: 13.3 g/dL (ref 12.0–16.0)

## 2021-11-29 LAB — MAGNESIUM: Magnesium: 1.6 mg/dL — ABNORMAL LOW (ref 1.7–2.4)

## 2021-11-29 LAB — HEPATITIS C ANTIBODY: HCV Ab: NONREACTIVE

## 2021-11-29 SURGERY — RIGHT/LEFT HEART CATH AND CORONARY ANGIOGRAPHY
Anesthesia: LOCAL

## 2021-11-29 MED ORDER — SODIUM CHLORIDE 0.9% FLUSH: 3.0000 mL | Freq: Two times a day (BID) | INTRAVENOUS | Status: DC

## 2021-11-29 MED ORDER — ASPIRIN 81 MG PO CHEW
81.0000 mg | CHEWABLE_TABLET | ORAL | Status: AC
  Administered 2021-11-30: 81 mg via ORAL
  Filled 2021-11-29: qty 1

## 2021-11-29 MED ORDER — SODIUM CHLORIDE 0.9 % IV SOLN: 250.0000 mL | INTRAVENOUS | Status: DC | PRN

## 2021-11-29 MED ORDER — ENOXAPARIN SODIUM 60 MG/0.6ML IJ SOSY
60.0000 mg | PREFILLED_SYRINGE | INTRAMUSCULAR | Status: DC
  Administered 2021-11-29: 60 mg via SUBCUTANEOUS
  Filled 2021-11-29: qty 0.6

## 2021-11-29 MED ORDER — LOSARTAN POTASSIUM 25 MG PO TABS
25.0000 mg | ORAL_TABLET | Freq: Every day | ORAL | Status: DC
Start: 2021-11-29 — End: 2021-12-01
  Administered 2021-11-29 – 2021-11-30 (×2): 25 mg via ORAL
  Filled 2021-11-29 (×2): qty 1

## 2021-11-29 MED ORDER — SODIUM CHLORIDE 0.9% FLUSH: 3.0000 mL | INTRAVENOUS | Status: DC | PRN

## 2021-11-29 MED ORDER — FUROSEMIDE 10 MG/ML IJ SOLN
80.0000 mg | Freq: Once | INTRAMUSCULAR | Status: AC
  Administered 2021-11-29: 80 mg via INTRAVENOUS
  Filled 2021-11-29: qty 8

## 2021-11-29 MED ORDER — SODIUM CHLORIDE 0.9 % IV SOLN: INTRAVENOUS | Status: DC

## 2021-11-29 MED ORDER — MAGNESIUM SULFATE 4 GM/100ML IV SOLN
4.0000 g | Freq: Once | INTRAVENOUS | Status: AC
  Administered 2021-11-29: 4 g via INTRAVENOUS
  Filled 2021-11-29: qty 100

## 2021-11-29 MED ORDER — POTASSIUM CHLORIDE CRYS ER 20 MEQ PO TBCR: 40.0000 meq | EXTENDED_RELEASE_TABLET | Freq: Four times a day (QID) | ORAL | Status: DC

## 2021-11-29 MED ORDER — POTASSIUM CHLORIDE CRYS ER 20 MEQ PO TBCR
40.0000 meq | EXTENDED_RELEASE_TABLET | Freq: Four times a day (QID) | ORAL | Status: AC
  Administered 2021-11-29 (×2): 40 meq via ORAL
  Filled 2021-11-29 (×2): qty 2

## 2021-11-29 NOTE — Progress Notes (Addendum)
?Progress Note ? ? ?Patient: Dakota Ramirez VZD:[id number] DOB: [birth year] DOA: 11/26/2021     3 ?DOS: the patient was seen and examined on 11/29/2021 ?  ?Brief hospital course: ?Mr. Stairs was admitted to the hospital with the working diagnosis of decompensated heart failure.  ? ?51 yo male with the past medical history of T2DM and hypertension who presented with dyspnea. Reported several days of worsening symptoms of dyspnea and edema, including abdomen and testicles. On his initial physical examination his blood pressure was 136/104, HR 110 RR 19 and 02 saturation 96%, lungs with no wheezing or rales, heart with S1 and S2 present and rhythmic, abdomen distended and positive lower extremity edema, extending to abdomen. Positive significant scrotal edema.  ? ? Na 132, K 4,0 CL 98, bicarbonate 27, glucose 306, bun 22 cr 1,0 ?BNP 398  ?Wbc 6,1 hgb 12,9 plt 286  ?Urine analysis with pH 6,0, SG 1,020, glucose 250  ? ?Chest radiograph with cardiomegaly, with hilar vascular congestion and small right pleural effusion.  ? ?EKG 111 bpm, right axis deviation, normal intervals, sinus rhythm, with no significant ST segment or T wave changes.  ? ?Patient was placed on furosemide for diuresis.  ?Echocardiogram with reduced LV systolic function 20 to 25%.  ? ?Further work up with cardiac catheterization.  ? ?Assessment and Plan: ?* CHF (congestive heart failure) (HCC) ?Echocardiogram with reduced LV systolic function with EF 20 to 25%, global hypokinesis, moderate reduction in RV systolic function. RVSP 39,8 mmHg. Mild to moderate mitral valve regurgitation.  ? ?Urine output over last 24 hrs is 11,100 ml ?With improvement in volume status. ?Blood pressure systolic 108 to 131 mmHg.  ? ?Plan to continue medical therapy with digoxin, losartan and spironolactone. ?One dose of furosemide today 80 mg IV ?Plan to start B blockade before his discharge, when more compensated heart failure.  ? ?Further work up with cardiac catheterization  today.  ? ?Hyponatremia ?Hypomagnesemia.  ? ?Renal function with serum cr at 0,94, k is 3,6 and serum bicarbonate at 34. ?Mg is 1.6  ? ?Plan to continue Kcl to prevent hypokalemia and continue Mg correction with mag sulfate. ?Follow up renal function and electrolytes in am.  ? ?Essential hypertension ?Continue close blood pressure monitoring ?Continue with losartan and diuresis with furosemide, spironolactone.  ? ?Type 2 diabetes mellitus (HCC) ?Fasting glucose is 166. ?Continue insulin sliding scale along with 70/30 15 units bid.  ? ?PVD (peripheral vascular disease) (HCC) ?Right foot transmetatarsal amputation. ?Continue local wound care.  ?Surgery was performed 07/22.  ? ?Class 2 obesity ?Calculated BMI is 38.1  ? ? ? ? ?  ? ?Subjective: Patient with improvement in dyspnea and edema. No chest pain  ? ?Physical Exam: ?Vitals:  ? 11/29/21 0006 11/29/21 0409 11/29/21 0853 11/29/21 1152  ?BP: (!) 122/96 118/82 102/72 131/88  ?Pulse: (!) 109 (!) 105 (!) 106   ?Resp: 16 16 18 18   ?Temp: 98.4 ?F (36.9 ?C) 97.8 ?F (36.6 ?C) 98.4 ?F (36.9 ?C) 98.2 ?F (36.8 ?C)  ?TempSrc: Oral Oral Oral Oral  ?SpO2: 97% 96% 96% 95%  ?Weight:  124.1 kg    ?Height:      ? ?Neurology awake and alert ?ENT with no pallor ?Cardiovascular with S1 and S2 present with no gallops, rubs or murmurs.  ?No JVD ?Lower extremity edema pitting + bilaterally ?Respiratory with no wheezing or rhonchi ?Abdomen not distended ?No rashed  ?Data Reviewed: ? ? ? ?Family Communication: no family at the bedside  ? ?  Disposition: ?Status is: Inpatient ?Remains inpatient appropriate because: heart failure  ? Planned Discharge Destination: Home ? ?Author: ?Coralie Keens, MD ?11/29/2021 12:33 PM ? ?For on call review [email or web address].  ?

## 2021-11-29 NOTE — Progress Notes (Signed)
?Heart and Vascular Care Navigation ? ?11/29/2021 ? ?Dakota Ramirez ?04/16/1971 ?[id number] ? ?Reason for Referral: Outpatient HF CSW consulted to enroll pt in [place] program ?  ?Engaged with patient by telephone for initial visit for Heart and Vascular Care Coordination. ?                                                                                                  ?Assessment:    CSW met with pt and got permission to send in referral to [facility] program.  Release signed and CSW to send in referral at time of DC from hospital.   ? ?Pt lives in a camper behind his dads house.  Reports no problem with transportation or getting food.  Has been out of work for a year he states following hospital stay and food amputation.  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  ? ?Pt states someone is trying to get him connected with [employer] health to help him get medications after DC.  Will inform [place] paramedics of this so they can help follow up and ensure pt is able to get medications. ? ?Reports no other concerns- hopeful to be released very soon from the hospital so he can go home to his dog.                                    ? ?HRT/VAS Care Coordination   ? ? Outpatient Care Team [facility]  ? Community Paramedic Name: referred to [place]  ? Living arrangements for the past 2 months Mobile Home  ? Lives with: Self  ? Patient Current Insurance Coverage Self-Pay  ? Patient Has Concern With Paying Medical Bills Yes  ? Medical Bill Referrals: [employer] helping with Medicaid  ? Does Patient Have Prescription Coverage? No  ? ?  ? ? ?Social History:                                                                             ?SDOH Screenings  ? ?Alcohol Screen: Not on file  ?Depression (PHQ2-9): Not on file  ?Financial Resource Strain: High Risk  ? Difficulty of Paying Living  Expenses: Hard  ?Food Insecurity: No Food Insecurity  ? Worried About Charity fundraiser in the Last Year: Never true  ? Ran Out of Food in the Last Year: Never true  ?Housing: Low Risk   ? Last Housing Risk Score: 0  ?Physical Activity: Not on file  ?Social Connections: Not on file  ?Stress: Not on file  ?Tobacco Use: Low Risk   ? Smoking Tobacco Use: Never  ? Smokeless Tobacco Use: Never  ?  Passive Exposure: Not on file  ?Transportation Needs: No Transportation Needs  ? Lack of Transportation (Medical): No  ? Lack of Transportation (Non-Medical): No  ? ? ?SDOH Interventions: ?Museum/gallery curator Resources:   [employer] referral sent in by [employer]- ?Social Security for Disability application assistance  ?Food Insecurity:  Food Insecurity Interventions: Intervention Not Indicated  ?Housing Insecurity:  Housing Interventions: Intervention Not Indicated  ?Transportation:    Reports he has multiple vehicles so no concern with transportation  ? ?Follow-up plan:   ? ?[facility] referral to be submitted once DC'd from the hospital. ? ?Jorge Ny, LCSW ?Clinical Social Worker ?[facility] ?Desk#: [phone] ?Cell#: [phone] ? ? ? ?

## 2021-11-29 NOTE — Assessment & Plan Note (Signed)
Right foot transmetatarsal amputation. ?Continue local wound care.  ?Surgery was performed 07/22.  ?

## 2021-11-29 NOTE — Evaluation (Signed)
Physical Therapy Evaluation ?Patient Details ?Name: Dakota Ramirez ?MRN: [id number] ?DOB: 28-Jul-1971 ?Today's Date: 11/29/2021 ? ?History of Present Illness ? Patient is 51 yo male presenting to the ED on 11/26/21 with  complaints of shortness of breath and scrotal swelling. Admitted with CHF exacerbation. PMH includes: poorly controlled type 2 diabetes not on insulin, HTN, R transmetatarsal amputation 7/22 ?  ?Clinical Impression ? Pt presents with condition above and deficits mentioned below, see PT Problem List. PTA, he was living alone in a camper with 1 STE. At baseline, pt is IND without DME and enjoys cooking and riding his [employer] motorcycle. Currently, pt displays deficits in balance along with mild impulsivity that place him at risk for falls. Pt initially required minA with staggering LOB bouts, but as distance progressed his stability and fluidity with gait improved in which he only required min guard assist for safety. Pt reports this was one of his first times out of bed in a few days, likely contributing to his balance deficits. I suspect with further mobility he will quickly return to his baseline. Attempted to educate pt on importance of medication compliance and minimizing sodium intake to manage his CHF and reduce his risk for re-admissions, but unsure of pt retention of education. Will continue to follow acutely to maximize his return to baseline prior to d/c home. ?   ? ?Recommendations for follow up therapy are one component of a multi-disciplinary discharge planning process, led by the attending physician.  Recommendations may be updated based on patient status, additional functional criteria and insurance authorization. ? ?Follow Up Recommendations No PT follow up ? ?  ?Assistance Recommended at Discharge Intermittent Supervision/Assistance  ?Patient can return home with the following ? A little help with walking and/or transfers;A little help with bathing/dressing/bathroom;Assistance with  cooking/housework;Direct supervision/assist for medications management;Assist for transportation;Help with stairs or ramp for entrance ? ?  ?Equipment Recommendations None recommended by PT  ?Recommendations for Other Services ?    ?  ?Functional Status Assessment Patient has had a recent decline in their functional status and demonstrates the ability to make significant improvements in function in a reasonable and predictable amount of time.  ? ?  ?Precautions / Restrictions Precautions ?Precautions: Fall ?Precaution Comments: impulsive ?Restrictions ?Weight Bearing Restrictions: No ?Other Position/Activity Restrictions: Watch HR  ? ?  ? ?Mobility ? Bed Mobility ?Overal bed mobility: Independent ?  ?  ?  ?  ?  ?  ?General bed mobility comments: able to transition from supine<>sit and sit<>supine without assist, no extra time needed, minimally impulsive ?  ? ?Transfers ?Overall transfer level: Needs assistance ?Equipment used: None ?Transfers: Sit to/from Stand ?Sit to Stand: Min guard ?  ?  ?  ?  ?  ?General transfer comment: min guard for safety due to minor impulsivity noted during session ?  ? ?Ambulation/Gait ?Ambulation/Gait assistance: Min guard, Min assist ?Gait Distance (Feet): 320 Feet ?Assistive device: None ?Gait Pattern/deviations: Step-through pattern, Staggering left, Staggering right ?Gait velocity: reduced ?Gait velocity interpretation: >2.62 ft/sec, indicative of community ambulatory ?  ?General Gait Details: Pt with lateral staggering intermittently initially, needing up to minA to regain balance. Fluidity and stability of gait improved as distance increased, progressing to only needing min guard for safety, even when changing head positions. ? ?Stairs ?  ?  ?  ?  ?  ? ?Wheelchair Mobility ?  ? ?Modified Rankin (Stroke Patients Only) ?  ? ?  ? ?Balance Overall balance assessment: Mild deficits observed, not  formally tested ?  ?  ?  ?  ?  ?  ?  ?  ?  ?  ?  ?  ?  ?  ?  ?  ?  ?  ?   ? ? ? ?Pertinent  Vitals/Pain Pain Assessment ?Pain Assessment: No/denies pain  ? ? ?Home Living Family/patient expects to be discharged to:: Private residence ?Living Arrangements: Alone ?Available Help at Discharge: Friend(s);Available PRN/intermittently ?Type of Home: Other(Comment) (Camper behind his father's property) ?Home Access: Stairs to enter ?  ?Entrance Stairs-Number of Steps: 1 "step" up into camper ?  ?Home Layout: One level ?  ?Additional Comments: does not drive, poor medication management, no documented anxiety but endorses he gets anxious  ?  ?Prior Function Prior Level of Function : Independent/Modified Independent ?  ?  ?  ?  ?  ?  ?Mobility Comments: independent ?ADLs Comments: independent, states "he gets what he needs to get done done". Likes to ride his [address] [employer] ?  ? ? ?Hand Dominance  ?   ? ?  ?Extremity/Trunk Assessment  ? Upper Extremity Assessment ?Upper Extremity Assessment: Defer to OT evaluation ?  ? ?Lower Extremity Assessment ?Lower Extremity Assessment: RLE deficits/detail ?RLE Deficits / Details: s/p transmet amputation 7/22 with poor wound healing per pt, wrapped in gauze ?  ? ?Cervical / Trunk Assessment ?Cervical / Trunk Assessment: Other exceptions ?Cervical / Trunk Exceptions: Increased body habitus  ?Communication  ? Communication: No difficulties  ?Cognition Arousal/Alertness: Awake/alert ?Behavior During Therapy: [facility] for tasks assessed/performed ?Overall Cognitive Status: Within Functional Limits for tasks assessed ?  ?  ?  ?  ?  ?  ?  ?  ?  ?  ?  ?  ?  ?  ?  ?  ?General Comments: Pt tangential at times. Pt seeming to avoid to recognize his balance deficits initially ?  ?  ? ?  ?General Comments General comments (skin integrity, edema, etc.): Educated pt on reducing sodium intake and on medication compliance, question intake of education ? ?  ?Exercises    ? ?Assessment/Plan  ?  ?PT Assessment Patient needs continued PT services  ?PT Problem List Decreased activity tolerance;Decreased  balance;Decreased mobility;Decreased skin integrity ? ?   ?  ?PT Treatment Interventions DME instruction;Gait training;Stair training;Functional mobility training;Therapeutic activities;Therapeutic exercise;Balance training;Neuromuscular re-education;Patient/family education   ? ?PT Goals (Current goals can be found in the Care Plan section)  ?Acute Rehab PT Goals ?Patient Stated Goal: to go home by Friday ?PT Goal Formulation: With patient ?Time For Goal Achievement: 12/13/21 ?Potential to Achieve Goals: Good ? ?  ?Frequency Min 3X/week ?  ? ? ?Co-evaluation   ?  ?  ?  ?  ? ? ?  ?AM-PAC PT "6 Clicks" Mobility  ?Outcome Measure Help needed turning from your back to your side while in a flat bed without using bedrails?: None ?Help needed moving from lying on your back to sitting on the side of a flat bed without using bedrails?: None ?Help needed moving to and from a bed to a chair (including a wheelchair)?: A Little ?Help needed standing up from a chair using your arms (e.g., wheelchair or bedside chair)?: A Little ?Help needed to walk in hospital room?: A Little ?Help needed climbing 3-5 steps with a railing? : A Little ?6 Click Score: 20 ? ?  ?End of Session   ?Activity Tolerance: Patient tolerated treatment well ?Patient left: in bed;with call bell/phone within reach ?Nurse Communication: Mobility status ?PT Visit Diagnosis:  Unsteadiness on feet (R26.81);Other abnormalities of gait and mobility (R26.89);Difficulty in walking, not elsewhere classified (R26.2) ?  ? ?Time: 1700-1727 ?PT Time Calculation (min) (ACUTE ONLY): 27 min ? ? ?Charges:   PT Evaluation ?$PT Eval Moderate Complexity: 1 Mod ?PT Treatments ?$Gait Training: 8-22 mins ?  ?   ? ? ?Raymond Gurney, PT, DPT ?Acute Rehabilitation Services  ?Pager: [phone] ?Office: [phone] ? ? ?Henrene Dodge Pettis ?11/29/2021, 5:36 PM ? ?

## 2021-11-29 NOTE — Assessment & Plan Note (Addendum)
Hyperglycemia.  ? ?Patient was placed on insulin therapy, sliding scale and basal with 70/30 with good toleration. ?At discharge will continue insulin therapy and will need close follow up.  ?Lifestyle modifications.  ?

## 2021-11-29 NOTE — Progress Notes (Signed)
Daily R foot wound care performed and measurements documented. Patient's complain of R hand cramping. RN gave mustard form cramping. Patient has received IV magnesium and PO potassium during the shift for electrolyte replacement. ? ?Patient verbalize "it helped a little". Oncoming RN made aware. ?

## 2021-11-29 NOTE — Progress Notes (Addendum)
? ? Advanced Heart Failure Rounding Note ? ?PCP-Cardiologist: None  ? ?Subjective:   ? ? ?CO-OX 66% this am. ? ?Brisk diuresis with lasix gtt yesterday. 11L UOP charted.  ? ?CVP 6 ? ?Scr stable at 0.94, K 3.6 ? ?Feels better, abdomen and legs less edematous. Dyspnea improving. ? ?Wants to go home. ? ?Objective:   ?Weight Range: ?124.1 kg ?Body mass index is 38.16 kg/m?.  ? ?Vital Signs:   ?Temp:  [97.1 ?F (36.2 ?C)-98.7 ?F (37.1 ?C)] 98.4 ?F (36.9 ?C) (04/19 1696) ?Pulse Rate:  [80-109] 106 (04/19 0853) ?Resp:  [13-20] 18 (04/19 0853) ?BP: (102-131)/(72-96) 102/72 (04/19 7893) ?SpO2:  [96 %-100 %] 96 % (04/19 0853) ?Weight:  [124.1 kg] 124.1 kg (04/19 0409) ?Last BM Date : 11/28/21 ? ?Weight change: ?Filed Weights  ? 11/27/21 0339 11/28/21 0416 11/29/21 0409  ?Weight: (!) 140 kg 134.8 kg 124.1 kg  ? ? ?Intake/Output:  ? ?Intake/Output Summary (Last 24 hours) at 11/29/2021 1058 ?Last data filed at 11/29/2021 1037 ?Gross per 24 hour  ?Intake 853.14 ml  ?Output 81017 ml  ?Net -12521.86 ml  ?  ? ? ?Physical Exam  ?  ?General:  No distress. Lying in bed. ?HEENT: Normal ?Neck: Supple. JVP 10 cm. Carotids 2+ bilat; no bruits.  ?Cor: PMI nondisplaced. Regular rate & rhythm. No rubs, gallops or murmurs. ?Lungs: Clear ?Abdomen: Soft, nontender, + distended. No hepatosplenomegaly. No bruits or masses. Good bowel sounds. ?Extremities: No cyanosis, clubbing, rash, 1+ edema, R TMA (wrapped with dressing) ?Neuro: Alert & orientedx3, cranial nerves grossly intact. moves all 4 extremities w/o difficulty. Affect pleasant ? ? ?Telemetry  ? ?Sinus/sinus tach 90s-low100s ? ?Labs  ?  ?CBC ?Recent Labs  ?  11/26/21 ?1314 11/28/21 ?0148  ?WBC 6.1 6.8  ?NEUTROABS 3.5  --   ?HGB 12.9* 12.3*  ?HCT 41.8 39.2  ?MCV 85.0 83.6  ?PLT 286 237  ? ?Basic Metabolic Panel ?Recent Labs  ?  11/28/21 ?0148 11/29/21 ?0501  ?NA 134* 134*  ?K 3.9 3.6  ?CL 94* 93*  ?CO2 32 34*  ?GLUCOSE 171* 166*  ?BUN 16 11  ?CREATININE 0.96 0.94  ?CALCIUM 8.3* 8.4*  ?MG  --   1.6*  ? ?Liver Function Tests ?Recent Labs  ?  11/26/21 ?1314 11/29/21 ?0501  ?AST 33 26  ?ALT 21 17  ?ALKPHOS 114 102  ?BILITOT 0.5 0.7  ?PROT 8.0 7.5  ?ALBUMIN 2.8* 2.5*  ? ?No results for input(s): LIPASE, AMYLASE in the last 72 hours. ?Cardiac Enzymes ?No results for input(s): CKTOTAL, CKMB, CKMBINDEX, TROPONINI in the last 72 hours. ? ?BNP: ?BNP (last 3 results) ?Recent Labs  ?  11/26/21 ?1314  ?BNP 398.2*  ? ? ?ProBNP (last 3 results) ?No results for input(s): PROBNP in the last 8760 hours. ? ? ?D-Dimer ?Recent Labs  ?  11/26/21 ?1314  ?DDIMER 2.04*  ? ?Hemoglobin A1C ?Recent Labs  ?  11/26/21 ?1732  ?HGBA1C 12.4*  ? ?Fasting Lipid Panel ?Recent Labs  ?  11/28/21 ?0148  ?CHOL 93  ?HDL 26*  ?LDLCALC 53  ?TRIG 72  ?CHOLHDL 3.6  ? ?Thyroid Function Tests ?No results for input(s): TSH, T4TOTAL, T3FREE, THYROIDAB in the last 72 hours. ? ?Invalid input(s): FREET3 ? ?Other results: ? ? ?Imaging  ? ? ?[place] EKG SITE RITE ? ?Result Date: 11/28/2021 ?If [employer] not attached, placement could not be confirmed due to current cardiac rhythm.  ? ? ?Medications:   ? ? ?Scheduled Medications: ? Chlorhexidine Gluconate Cloth  6 each Topical Daily  ? digoxin  0.125 mg Oral Daily  ? enoxaparin (LOVENOX) injection  70 mg Subcutaneous Q24H  ? insulin aspart  0-9 Units Subcutaneous Q4H  ? insulin aspart protamine- aspart  15 Units Subcutaneous BID WC  ? sodium chloride flush  10-40 mL Intracatheter Q12H  ? spironolactone  25 mg Oral Daily  ? ? ?Infusions: ? furosemide (LASIX) 200 mg in dextrose 5% 100 mL (2mg /mL) infusion 10 mg/hr (11/29/21 1013)  ? ? ?PRN Medications: ?acetaminophen **OR** acetaminophen, ALPRAZolam, ondansetron (ZOFRAN) IV, sodium chloride flush ? ? ? ?Patient Profile  ? ?51 y.o. male with history of uncontrolled DM, HTN, osteomyelitis s/p R TMA, noncompliance with medical therapy. Admitted with new biventricular heart failure. ? ?Assessment/Plan  ? ?Acute biventricular CHF: ?-New diagnosis this  admit ?-Suspect ICM. Has multiple risk factors for CAD, WMA on echo, and abnormal ECG. Reports hx CHF in his mother. No hx alcohol or drug abuse. ?-Echo EF 20-25%, regional WMA, moderately dilated LV, RV moderately reduced, RVSP 40 mmHg, mild to moderate MR, dilated IVC with estimated RAP 15 mmHg ?-NYHA III. Massively volume overloaded on admit. Very brisk diuresis with lasix gtt. Stop lasix gtt.  ?-CVP 6 but still has some fluid on board. Will give 80 mg lasix IV once this evening. ?-Continue digoxin 0.125 mg daily ?-Continue spiro 25 mg daily ?-Add losartan 25 mg daily. Switch to [facility] prior to discharge if BP stable. ?-No SGLT2i with uncontrolled DM and concern for hygiene ?-Low albumin state likely contributing to some of his LE edema. ?- Mag 1.6 and K 3.6. Supp today. ?- [facility] tomorrow. cMRI if no obstructive CAD to explain cardiomyopathy. ?  ?2. Uncontrolled DM: ?- A1c 12.4% ?- Has not been compliant with medications ?- Per TRH ?  ?3. Hx osteomyelitis s/p right TMA: ?- Amputation stump has been slow to heal d/t poor diabetes control and hygiene ?-Follows with Podiatry at [place] ?  ?4. HTN: ?- BP stable. Medication adjustments as above. ?  ?5. Hypoalbuminemia ?-Liver [place] with echogenic parenchyma, ? Hepatic steatosis or fibrosis. Cannot exclude cirrhosis ?-TRH further evaluating ?-? Secondary to RV failure ?  ?SDOH:  ?-Uncertain if he has medicaid. Will review with HF CSW. ?-Medication compliance an issue. Would benefit from Paramedicine at discharge.  ?-Lives in a camper behind father's house. Unemployed and has limited social support. ?  ? ? ?Length of Stay: 3 ? ?FINCH, LINDSAY N, PA-C  ?11/29/2021, 10:58 AM ? ?Advanced Heart Failure Team ?Pager [phone] (M-F; 7a - 5p)  ?Please contact [facility] for night-coverage after hours (5p -7a ) and weekends on [email or web address] ? ?Patient seen and examined with the above-signed Advanced Practice Provider and/or Housestaff. I personally reviewed laboratory data, imaging  studies and relevant notes. I independently examined the patient and formulated the important aspects of the plan. I have edited the note to reflect any of my changes or salient points. I have personally discussed the plan with the patient and/or family. ? ?Diuresing briskly on lasix gtt. Co-ox 66% CVP down to 6. Breathing better. Denies CP or orthopnea. Scr stable ? ?General:  Lying in bed . No resp difficulty ?HEENT: normal ?Neck: supple. no JVD. Carotids 2+ bilat; no bruits. No lymphadenopathy or thryomegaly appreciated. ?Cor: PMI nondisplaced. Tachy regular  No rubs, gallops or murmurs. ?Lungs: clear ?Abdomen: soft, nontender, nondistended. No hepatosplenomegaly. No bruits or masses. Good bowel sounds. ?Extremities: no cyanosis, clubbing, rash, 1-2+ edema ?Neuro: alert & orientedx3, cranial nerves grossly intact. moves  all 4 extremities w/o difficulty. Affect pleasant ? ?Diuresing well but remains volume overloaded. Continue IV diuresis. Keep legs wrapped. Supp electrolytes. Suspect underlying iCM. R/L cath tomorrow.  ? ?Arvilla Meres, MD  ?7:00 PM ? ?  ?

## 2021-11-29 NOTE — Assessment & Plan Note (Addendum)
Blood pressure controlled with entresto and spironolactone.  ?

## 2021-11-29 NOTE — Assessment & Plan Note (Addendum)
Hypomagnesemia, hyponatremia ? ?His renal function remained stable with a discharge serum cr of 0.85, K is 4.8 and serum bicarbonate at 31. Na is 133.  ?Continue with spironolactone and furosemide.  ?Follow up renal function as outpatient.  ?

## 2021-11-29 NOTE — Evaluation (Signed)
Occupational Therapy Evaluation ?Patient Details ?Name: Dakota Ramirez ?MRN: [id number] ?DOB: 23-Apr-1971 ?Today's Date: 11/29/2021 ? ? ?History of Present Illness Patient is 51 yo male presenting to the ED on 11/26/21 with  complaints of shortness of breath and scrotal swelling. Admitted with CHF exacerbation. 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 ?Clinical Impression ?  ?Prior to this admission, patient living in a camper behind his father's property. Patient does not drive, bird bathes at baseline or showers at a friends house, and poorly adheres to medication and overall health. Patient independent with ADLs per patient's report. Currently, patient is min A for ADLs, and min guard for transfers. Patient minimally tangential and impulsive in session, often using humor to play off deficits. OT will continue to follow patient acutely, however patient declining OT services at discharge.  ?   ? ?Recommendations for follow up therapy are one component of a multi-disciplinary discharge planning process, led by the attending physician.  Recommendations may be updated based on patient status, additional functional criteria and insurance authorization.  ? ?Follow Up Recommendations ? No OT follow up (patient declining)  ?  ?Assistance Recommended at Discharge PRN  ?Patient can return home with the following A little help with bathing/dressing/bathroom;Direct supervision/assist for medications management;Assist for transportation ? ?  ?Functional Status Assessment ? Patient has had a recent decline in their functional status and demonstrates the ability to make significant improvements in function in a reasonable and predictable amount of time.  ?Equipment Recommendations ? Other (comment) (Will continue to assess)  ?  ?Recommendations for Other Services   ? ? ?  ?Precautions / Restrictions Precautions ?Precautions: Fall ?Restrictions ?Weight Bearing  Restrictions: No ?Other Position/Activity Restrictions: Watch HR  ? ?  ? ?Mobility Bed Mobility ?Overal bed mobility: Independent ?  ?  ?  ?  ?  ?  ?General bed mobility comments: able to transition from supine<>sit and sit<>supine without assist, no extra time needed, minimally impulsive ?  ? ?Transfers ?Overall transfer level: Needs assistance ?Equipment used: None ?Transfers: Sit to/from Stand ?Sit to Stand: Min guard ?  ?  ?  ?  ?  ?General transfer comment: min guard for safety due to minor impulsivity noted during session ?  ? ?  ?Balance Overall balance assessment: Mild deficits observed, not formally tested ?  ?  ?  ?  ?  ?  ?  ?  ?  ?  ?  ?  ?  ?  ?  ?  ?  ?  ?   ? ?ADL either performed or assessed with clinical judgement  ? ?ADL Overall ADL's : Needs assistance/impaired ?Eating/Feeding: Sitting;Set up ?  ?Grooming: Set up;Sitting ?  ?Upper Body Bathing: Set up;Sitting ?  ?Lower Body Bathing: Minimal assistance;Sitting/lateral leans;Sit to/from stand ?  ?Upper Body Dressing : Set up;Sitting ?  ?Lower Body Dressing: Minimal assistance;Sitting/lateral leans;Sit to/from stand ?  ?Toilet Transfer: Min guard;Stand-pivot;BSC/3in1 ?  ?Toileting- Clothing Manipulation and Hygiene: Set up;Sitting/lateral lean;Sit to/from stand ?  ?  ?  ?Functional mobility during ADLs: Minimal assistance ?General ADL Comments: Patient presenting with scrotal swelling, increased edema, and minimally decreased activity tolerance  ? ? ? ?Vision Baseline Vision/History: 0 No visual deficits ?Ability to See in Adequate Light: 0 Adequate ?Patient Visual Report: No change from baseline ?   ?   ?Perception   ?  ?Praxis   ?  ? ?Pertinent Vitals/Pain Pain Assessment ?Pain Assessment: No/denies  pain  ? ? ? ?Hand Dominance   ?  ?Extremity/Trunk Assessment Upper Extremity Assessment ?Upper Extremity Assessment: Overall WFL for tasks assessed ?  ?Lower Extremity Assessment ?Lower Extremity Assessment: Defer to PT evaluation;Overall [facility] for tasks  assessed ?  ?Cervical / Trunk Assessment ?Cervical / Trunk Assessment: Other exceptions ?Cervical / Trunk Exceptions: Increased body habitus ?  ?Communication Communication ?Communication: No difficulties ?  ?Cognition Arousal/Alertness: Awake/alert ?Behavior During Therapy: Anxious ?Overall Cognitive Status: Within Functional Limits for tasks assessed ?  ?  ?  ?  ?  ?  ?  ?  ?  ?  ?  ?  ?  ?  ?  ?  ?General Comments: Minimally tangential, patient poor historian of medical care, often attempts to hide his lack of awareness with humor ?  ?  ?General Comments    ? ?  ?Exercises   ?  ?Shoulder Instructions    ? ? ?Home Living Family/patient expects to be discharged to:: Private residence ?Living Arrangements: Alone ?Available Help at Discharge: Friend(s);Available PRN/intermittently ?Type of Home: Other(Comment) (Camper behind his father's property) ?Home Access: Stairs to enter ?Entrance Stairs-Number of Steps: 1 "step" up into camper ?  ?Home Layout: One level ?  ?  ?Bathroom Shower/Tub: Sponge bathes at baseline (heats up a bowl of water in the microwave and uses a "spritzer" to shower) ?  ?Bathroom Toilet: Standard ?  ?  ?  ?  ?Additional Comments: does not drive, poor medication management, no documented anxiety but endorses he gets anxious ?  ? ?  ?Prior Functioning/Environment Prior Level of Function : Independent/Modified Independent ?  ?  ?  ?  ?  ?  ?Mobility Comments: independent ?ADLs Comments: independent, states "he gets what he needs to get done done" ?  ? ?  ?  ?OT Problem List: Decreased activity tolerance;Decreased cognition;Cardiopulmonary status limiting activity;Increased edema;Obesity ?  ?   ?OT Treatment/Interventions: Self-care/ADL training;Therapeutic exercise;Energy conservation;DME and/or AE instruction;Manual therapy;Therapeutic activities;Cognitive remediation/compensation;Patient/family education;Balance training  ?  ?OT Goals(Current goals can be found in the care plan section) Acute  Rehab OT Goals ?Patient Stated Goal: to go home Friday ?OT Goal Formulation: With patient ?Time For Goal Achievement: 12/13/21 ?Potential to Achieve Goals: Fair  ?OT Frequency: Min 2X/week ?  ? ?Co-evaluation   ?  ?  ?  ?  ? ?  ?AM-PAC OT "6 Clicks" Daily Activity     ?Outcome Measure Help from another person eating meals?: A Little ?Help from another person taking care of personal grooming?: A Little ?Help from another person toileting, which includes using toliet, bedpan, or urinal?: A Little ?Help from another person bathing (including washing, rinsing, drying)?: A Little ?Help from another person to put on and taking off regular upper body clothing?: A Little ?Help from another person to put on and taking off regular lower body clothing?: A Little ?6 Click Score: 18 ?  ?End of Session Nurse Communication: Mobility status ? ?Activity Tolerance: Patient tolerated treatment well ?Patient left: in bed;with call bell/phone within reach;with family/visitor present ? ?OT Visit Diagnosis: Other abnormalities of gait and mobility (R26.89);Unsteadiness on feet (R26.81)  ?              ?Time: 3846-6599 ?OT Time Calculation (min): 13 min ?Charges:  OT General Charges ?$OT Visit: 1 Visit ?OT Evaluation ?$OT Eval Moderate Complexity: 1 Mod ? ?Pollyann Glen E. Caia Lofaro, OTR/L ?Acute Rehabilitation Services ?[phone] ?[phone]  ? ?Pollyann Glen Gagan Dillion ?11/29/2021, 3:47 PM ?

## 2021-11-29 NOTE — TOC Progression Note (Signed)
Transition of Care (TOC) - Progression Note  ? ? ?Patient Details  ?Name: TIQUAN BOUCH ?MRN: [id number] ?Date of Birth: Aug 09, 1971 ? ?Transition of Care (TOC) CM/SW Contact  ?Daymond Cordts, LCSW ?Phone Number: ?11/29/2021, 4:25 PM ? ?Clinical Narrative:    ?HF CSW spoke with Mr. Overbeck at bedside about a disability application with the [facility]. Mr. Stukey agreed to signing and having the CSW send paperwork over to the [facility]. CSW completed a brief SDOH with Mr. Moradi and he reported that he does have transportation, housing, and food and that he is ready to get out of the hospital and get back home. ? ?CSW will continue to follow throughout discharge. ? ? ?Expected Discharge Plan: Home/Self Care ?Barriers to Discharge: Continued Medical Work up ? ?Expected Discharge Plan and Services ?Expected Discharge Plan: Home/Self Care ?  ?Discharge Planning Services: CM Consult ?  ?Living arrangements for the past 2 months: Mobile Home ?                ?  ?  ?  ?  ?  ?  ?  ?  ?  ?  ? ? ?Social Determinants of Health (SDOH) Interventions ?Food Insecurity Interventions: Intervention Not Indicated ?Housing Interventions: Intervention Not Indicated ? ?Readmission Risk Interventions ?   ? View : No data to display.  ?  ?  ?  ? ?Ladainian Therien, MSW, LCSW ?[phone] ?Heart Failure Social Worker  ? ?

## 2021-11-29 NOTE — H&P (View-Only) (Signed)
? ? Advanced Heart Failure Rounding Note ? ?PCP-Cardiologist: None  ? ?Subjective:   ? ? ?CO-OX 66% this am. ? ?Brisk diuresis with lasix gtt yesterday. 11L UOP charted.  ? ?CVP 6 ? ?Scr stable at 0.94, K 3.6 ? ?Feels better, abdomen and legs less edematous. Dyspnea improving. ? ?Wants to go home. ? ?Objective:   ?Weight Range: ?124.1 kg ?Body mass index is 38.16 kg/m?.  ? ?Vital Signs:   ?Temp:  [97.1 ?F (36.2 ?C)-98.7 ?F (37.1 ?C)] 98.4 ?F (36.9 ?C) (04/19 1696) ?Pulse Rate:  [80-109] 106 (04/19 0853) ?Resp:  [13-20] 18 (04/19 0853) ?BP: (102-131)/(72-96) 102/72 (04/19 7893) ?SpO2:  [96 %-100 %] 96 % (04/19 0853) ?Weight:  [124.1 kg] 124.1 kg (04/19 0409) ?Last BM Date : 11/28/21 ? ?Weight change: ?Filed Weights  ? 11/27/21 0339 11/28/21 0416 11/29/21 0409  ?Weight: (!) 140 kg 134.8 kg 124.1 kg  ? ? ?Intake/Output:  ? ?Intake/Output Summary (Last 24 hours) at 11/29/2021 1058 ?Last data filed at 11/29/2021 1037 ?Gross per 24 hour  ?Intake 853.14 ml  ?Output 81017 ml  ?Net -12521.86 ml  ?  ? ? ?Physical Exam  ?  ?General:  No distress. Lying in bed. ?HEENT: Normal ?Neck: Supple. JVP 10 cm. Carotids 2+ bilat; no bruits.  ?Cor: PMI nondisplaced. Regular rate & rhythm. No rubs, gallops or murmurs. ?Lungs: Clear ?Abdomen: Soft, nontender, + distended. No hepatosplenomegaly. No bruits or masses. Good bowel sounds. ?Extremities: No cyanosis, clubbing, rash, 1+ edema, R TMA (wrapped with dressing) ?Neuro: Alert & orientedx3, cranial nerves grossly intact. moves all 4 extremities w/o difficulty. Affect pleasant ? ? ?Telemetry  ? ?Sinus/sinus tach 90s-low100s ? ?Labs  ?  ?CBC ?Recent Labs  ?  11/26/21 ?1314 11/28/21 ?0148  ?WBC 6.1 6.8  ?NEUTROABS 3.5  --   ?HGB 12.9* 12.3*  ?HCT 41.8 39.2  ?MCV 85.0 83.6  ?PLT 286 237  ? ?Basic Metabolic Panel ?Recent Labs  ?  11/28/21 ?0148 11/29/21 ?0501  ?NA 134* 134*  ?K 3.9 3.6  ?CL 94* 93*  ?CO2 32 34*  ?GLUCOSE 171* 166*  ?BUN 16 11  ?CREATININE 0.96 0.94  ?CALCIUM 8.3* 8.4*  ?MG  --   1.6*  ? ?Liver Function Tests ?Recent Labs  ?  11/26/21 ?1314 11/29/21 ?0501  ?AST 33 26  ?ALT 21 17  ?ALKPHOS 114 102  ?BILITOT 0.5 0.7  ?PROT 8.0 7.5  ?ALBUMIN 2.8* 2.5*  ? ?No results for input(s): LIPASE, AMYLASE in the last 72 hours. ?Cardiac Enzymes ?No results for input(s): CKTOTAL, CKMB, CKMBINDEX, TROPONINI in the last 72 hours. ? ?BNP: ?BNP (last 3 results) ?Recent Labs  ?  11/26/21 ?1314  ?BNP 398.2*  ? ? ?ProBNP (last 3 results) ?No results for input(s): PROBNP in the last 8760 hours. ? ? ?D-Dimer ?Recent Labs  ?  11/26/21 ?1314  ?DDIMER 2.04*  ? ?Hemoglobin A1C ?Recent Labs  ?  11/26/21 ?1732  ?HGBA1C 12.4*  ? ?Fasting Lipid Panel ?Recent Labs  ?  11/28/21 ?0148  ?CHOL 93  ?HDL 26*  ?LDLCALC 53  ?TRIG 72  ?CHOLHDL 3.6  ? ?Thyroid Function Tests ?No results for input(s): TSH, T4TOTAL, T3FREE, THYROIDAB in the last 72 hours. ? ?Invalid input(s): FREET3 ? ?Other results: ? ? ?Imaging  ? ? ?[place] EKG SITE RITE ? ?Result Date: 11/28/2021 ?If [employer] not attached, placement could not be confirmed due to current cardiac rhythm.  ? ? ?Medications:   ? ? ?Scheduled Medications: ? Chlorhexidine Gluconate Cloth  6 each Topical Daily  ? digoxin  0.125 mg Oral Daily  ? enoxaparin (LOVENOX) injection  70 mg Subcutaneous Q24H  ? insulin aspart  0-9 Units Subcutaneous Q4H  ? insulin aspart protamine- aspart  15 Units Subcutaneous BID WC  ? sodium chloride flush  10-40 mL Intracatheter Q12H  ? spironolactone  25 mg Oral Daily  ? ? ?Infusions: ? furosemide (LASIX) 200 mg in dextrose 5% 100 mL (2mg /mL) infusion 10 mg/hr (11/29/21 1013)  ? ? ?PRN Medications: ?acetaminophen **OR** acetaminophen, ALPRAZolam, ondansetron (ZOFRAN) IV, sodium chloride flush ? ? ? ?Patient Profile  ? ?50 y.o. male with history of uncontrolled DM, HTN, osteomyelitis s/p R TMA, noncompliance with medical therapy. Admitted with new biventricular heart failure. ? ?Assessment/Plan  ? ?Acute biventricular CHF: ?-New diagnosis this  admit ?-Suspect ICM. Has multiple risk factors for CAD, WMA on echo, and abnormal ECG. Reports hx CHF in his mother. No hx alcohol or drug abuse. ?-Echo EF 20-25%, regional WMA, moderately dilated LV, RV moderately reduced, RVSP 40 mmHg, mild to moderate MR, dilated IVC with estimated RAP 15 mmHg ?-NYHA III. Massively volume overloaded on admit. Very brisk diuresis with lasix gtt. Stop lasix gtt.  ?-CVP 6 but still has some fluid on board. Will give 80 mg lasix IV once this evening. ?-Continue digoxin 0.125 mg daily ?-Continue spiro 25 mg daily ?-Add losartan 25 mg daily. Switch to [facility] prior to discharge if BP stable. ?-No SGLT2i with uncontrolled DM and concern for hygiene ?-Low albumin state likely contributing to some of his LE edema. ?- Mag 1.6 and K 3.6. Supp today. ?- [facility] tomorrow. cMRI if no obstructive CAD to explain cardiomyopathy. ?  ?2. Uncontrolled DM: ?- A1c 12.4% ?- Has not been compliant with medications ?- Per TRH ?  ?3. Hx osteomyelitis s/p right TMA: ?- Amputation stump has been slow to heal d/t poor diabetes control and hygiene ?-Follows with Podiatry at [place] ?  ?4. HTN: ?- BP stable. Medication adjustments as above. ?  ?5. Hypoalbuminemia ?-Liver [place] with echogenic parenchyma, ? Hepatic steatosis or fibrosis. Cannot exclude cirrhosis ?-TRH further evaluating ?-? Secondary to RV failure ?  ?SDOH:  ?-Uncertain if he has medicaid. Will review with HF CSW. ?-Medication compliance an issue. Would benefit from Paramedicine at discharge.  ?-Lives in a camper behind father's house. Unemployed and has limited social support. ?  ? ? ?Length of Stay: 3 ? ?FINCH, LINDSAY N, PA-C  ?11/29/2021, 10:58 AM ? ?Advanced Heart Failure Team ?Pager [phone] (M-F; 7a - 5p)  ?Please contact [facility] for night-coverage after hours (5p -7a ) and weekends on [email or web address] ? ?Patient seen and examined with the above-signed Advanced Practice Provider and/or Housestaff. I personally reviewed laboratory data, imaging  studies and relevant notes. I independently examined the patient and formulated the important aspects of the plan. I have edited the note to reflect any of my changes or salient points. I have personally discussed the plan with the patient and/or family. ? ?Diuresing briskly on lasix gtt. Co-ox 66% CVP down to 6. Breathing better. Denies CP or orthopnea. Scr stable ? ?General:  Lying in bed . No resp difficulty ?HEENT: normal ?Neck: supple. no JVD. Carotids 2+ bilat; no bruits. No lymphadenopathy or thryomegaly appreciated. ?Cor: PMI nondisplaced. Tachy regular  No rubs, gallops or murmurs. ?Lungs: clear ?Abdomen: soft, nontender, nondistended. No hepatosplenomegaly. No bruits or masses. Good bowel sounds. ?Extremities: no cyanosis, clubbing, rash, 1-2+ edema ?Neuro: alert & orientedx3, cranial nerves grossly intact. moves  all 4 extremities w/o difficulty. Affect pleasant ? ?Diuresing well but remains volume overloaded. Continue IV diuresis. Keep legs wrapped. Supp electrolytes. Suspect underlying iCM. R/L cath tomorrow.  ? ?Carren Blakley, MD  ?7:00 PM ? ?  ?

## 2021-11-29 NOTE — Hospital Course (Addendum)
Mr. Kluger was admitted to the hospital with the working diagnosis of decompensated heart failure.  ? ?51 yo male with the past medical history of T2DM and hypertension who presented with dyspnea. Reported several days of worsening symptoms of dyspnea and edema, including his abdomen and testicles. On his initial physical examination his blood pressure was 136/104, HR 110 RR 19 and 02 saturation 96%, lungs with no wheezing or rales, heart with S1 and S2 present and rhythmic, abdomen distended and positive lower extremity edema, extending to his abdomen. Positive significant scrotal edema.  ? ? Na 132, K 4,0 CL 98, bicarbonate 27, glucose 306, bun 22 cr 1,0 ?BNP 398  ?Wbc 6,1 hgb 12,9 plt 286  ?Urine analysis with pH 6,0, SG 1,020, glucose 250  ? ?Chest radiograph with cardiomegaly, with hilar vascular congestion and small right pleural effusion.  ? ?EKG 111 bpm, right axis deviation, normal intervals, sinus rhythm, with no significant ST segment or T wave changes.  ? ?Patient was placed on furosemide for diuresis.  ?Echocardiogram with reduced LV systolic function 20 to 25%.  ? ?Further work up with cardiac catheterization showed PCWP 20, cardiac output 5.9 and index 2,4. PA mean 25.  ? ?Volume status improved and patient was placed on guideline medical therapy, instruction to follow up as outpatient.  ? ?

## 2021-11-29 NOTE — Assessment & Plan Note (Addendum)
Patient was admitted to the cardiac ward, he was placed on aggressive diuresis with IV furosemide, negative fluid balance was achieved, -27,596 ml, with significant improvement in his symptoms.  ? ?Echocardiogram with reduced LV systolic function with EF 20 to 25%, global hypokinesis, moderate reduction in RV systolic function. RVSP 39,8 mmHg. Mild to moderate mitral valve regurgitation.  ? ?Cardiac catheterization with mean PA pressure 25, PCW 20, cardiac output 5.9/ index 2.9 (Fick). Normal coronaries.  ? ?Patient will continue medical therapy with digoxin, entresto and spironolactone. ?Plan to follow up as outpatient. ?Continue diuresis with furosemide.  ?.  ?

## 2021-11-29 NOTE — Assessment & Plan Note (Signed)
Calculated BMI is 38.1  ?

## 2021-11-30 ENCOUNTER — Encounter (HOSPITAL_COMMUNITY)
Admission: EM | Disposition: A | Discharge status: Home / Self Care | Payer: Self-pay | Source: Home / Self Care | Attending: Internal Medicine

## 2021-11-30 DIAGNOSIS — E669 Obesity, unspecified: Secondary | ICD-10-CM

## 2021-11-30 HISTORY — PX: RIGHT/LEFT HEART CATH AND CORONARY ANGIOGRAPHY: CATH118266

## 2021-11-30 LAB — POCT I-STAT EG7
Acid-Base Excess: 10 mmol/L — ABNORMAL HIGH (ref 0.0–2.0)
Acid-Base Excess: 10 mmol/L — ABNORMAL HIGH (ref 0.0–2.0)
Bicarbonate: 37.5 mmol/L — ABNORMAL HIGH (ref 20.0–28.0)
Bicarbonate: 37.9 mmol/L — ABNORMAL HIGH (ref 20.0–28.0)
Calcium, Ion: 1.21 mmol/L (ref 1.15–1.40)
Calcium, Ion: 1.22 mmol/L (ref 1.15–1.40)
HCT: 43 % (ref 39.0–52.0)
HCT: 43 % (ref 39.0–52.0)
Hemoglobin: 14.6 g/dL (ref 13.0–17.0)
Hemoglobin: 14.6 g/dL (ref 13.0–17.0)
O2 Saturation: 65 %
O2 Saturation: 68 %
Potassium: 4.1 mmol/L (ref 3.5–5.1)
Potassium: 4.2 mmol/L (ref 3.5–5.1)
Sodium: 139 mmol/L (ref 135–145)
Sodium: 139 mmol/L (ref 135–145)
TCO2: 39 mmol/L — ABNORMAL HIGH (ref 22–32)
TCO2: 40 mmol/L — ABNORMAL HIGH (ref 22–32)
pCO2, Ven: 63.1 mmHg — ABNORMAL HIGH (ref 44–60)
pCO2, Ven: 64.9 mmHg — ABNORMAL HIGH (ref 44–60)
pH, Ven: 7.374 (ref 7.25–7.43)
pH, Ven: 7.382 (ref 7.25–7.43)
pO2, Ven: 36 mmHg (ref 32–45)
pO2, Ven: 38 mmHg (ref 32–45)

## 2021-11-30 LAB — COOXEMETRY PANEL
Carboxyhemoglobin: 1.9 % — ABNORMAL HIGH (ref 0.5–1.5)
Methemoglobin: <0.7 % (ref 0.0–1.5)
O2 Saturation: 66.9 %
Total hemoglobin: 13.6 g/dL (ref 12.0–16.0)

## 2021-11-30 LAB — CBC
HCT: 41.6 % (ref 39.0–52.0)
Hemoglobin: 13.3 g/dL (ref 13.0–17.0)
MCH: 26.4 pg (ref 26.0–34.0)
MCHC: 32 g/dL (ref 30.0–36.0)
MCV: 82.5 fL (ref 80.0–100.0)
Platelets: 256 10*3/uL (ref 150–400)
RBC: 5.04 MIL/uL (ref 4.22–5.81)
RDW: 14 % (ref 11.5–15.5)
WBC: 7.3 10*3/uL (ref 4.0–10.5)
nRBC: 0 % (ref 0.0–0.2)

## 2021-11-30 LAB — POCT I-STAT 7, (LYTES, BLD GAS, ICA,H+H)
Acid-Base Excess: 8 mmol/L — ABNORMAL HIGH (ref 0.0–2.0)
Bicarbonate: 33.7 mmol/L — ABNORMAL HIGH (ref 20.0–28.0)
Calcium, Ion: 1.09 mmol/L — ABNORMAL LOW (ref 1.15–1.40)
HCT: 42 % (ref 39.0–52.0)
Hemoglobin: 14.3 g/dL (ref 13.0–17.0)
O2 Saturation: 97 %
Potassium: 3.9 mmol/L (ref 3.5–5.1)
Sodium: 140 mmol/L (ref 135–145)
TCO2: 35 mmol/L — ABNORMAL HIGH (ref 22–32)
pCO2 arterial: 51.1 mmHg — ABNORMAL HIGH (ref 32–48)
pH, Arterial: 7.427 (ref 7.35–7.45)
pO2, Arterial: 88 mmHg (ref 83–108)

## 2021-11-30 LAB — BASIC METABOLIC PANEL
Anion gap: 7 (ref 5–15)
BUN: 13 mg/dL (ref 6–20)
CO2: 32 mmol/L (ref 22–32)
Calcium: 8.4 mg/dL — ABNORMAL LOW (ref 8.9–10.3)
Chloride: 92 mmol/L — ABNORMAL LOW (ref 98–111)
Creatinine, Ser: 0.97 mg/dL (ref 0.61–1.24)
GFR, Estimated: >60 mL/min (ref >60)
Glucose, Bld: 180 mg/dL — ABNORMAL HIGH (ref 70–99)
Potassium: 4.2 mmol/L (ref 3.5–5.1)
Sodium: 131 mmol/L — ABNORMAL LOW (ref 135–145)

## 2021-11-30 LAB — GLUCOSE, CAPILLARY
Glucose-Capillary: 109 mg/dL — ABNORMAL HIGH (ref 70–99)
Glucose-Capillary: 140 mg/dL — ABNORMAL HIGH (ref 70–99)
Glucose-Capillary: 185 mg/dL — ABNORMAL HIGH (ref 70–99)
Glucose-Capillary: 261 mg/dL — ABNORMAL HIGH (ref 70–99)
Glucose-Capillary: 287 mg/dL — ABNORMAL HIGH (ref 70–99)
Glucose-Capillary: 301 mg/dL — ABNORMAL HIGH (ref 70–99)

## 2021-11-30 SURGERY — RIGHT/LEFT HEART CATH AND CORONARY ANGIOGRAPHY
Anesthesia: LOCAL

## 2021-11-30 MED ORDER — VERAPAMIL HCL 2.5 MG/ML IV SOLN
INTRAVENOUS | Status: DC | PRN
  Administered 2021-11-30: 10 mL via INTRA_ARTERIAL

## 2021-11-30 MED ORDER — VERAPAMIL HCL 2.5 MG/ML IV SOLN
INTRAVENOUS | Status: AC
  Filled 2021-11-30: qty 2

## 2021-11-30 MED ORDER — HEPARIN (PORCINE) IN NACL 1000-0.9 UT/500ML-% IV SOLN
INTRAVENOUS | Status: DC | PRN
  Administered 2021-11-30 (×2): 500 mL

## 2021-11-30 MED ORDER — FENTANYL CITRATE (PF) 100 MCG/2ML IJ SOLN
INTRAMUSCULAR | Status: AC
Start: 2021-11-30 — End: ?
  Filled 2021-11-30: qty 2

## 2021-11-30 MED ORDER — HEPARIN (PORCINE) IN NACL 1000-0.9 UT/500ML-% IV SOLN
INTRAVENOUS | Status: AC
  Filled 2021-11-30: qty 1000

## 2021-11-30 MED ORDER — LABETALOL HCL 5 MG/ML IV SOLN: 10.0000 mg | INTRAVENOUS | Status: DC | PRN

## 2021-11-30 MED ORDER — IOHEXOL 350 MG/ML SOLN
INTRAVENOUS | Status: DC | PRN
  Administered 2021-11-30: 70 mL

## 2021-11-30 MED ORDER — INSULIN ASPART 100 UNIT/ML IJ SOLN
0.0000 [IU] | Freq: Every day | INTRAMUSCULAR | Status: DC
  Administered 2021-11-30: 4 [IU] via SUBCUTANEOUS

## 2021-11-30 MED ORDER — MIDAZOLAM HCL 2 MG/2ML IJ SOLN
INTRAMUSCULAR | Status: DC | PRN
  Administered 2021-11-30: 1 mg via INTRAVENOUS

## 2021-11-30 MED ORDER — LIDOCAINE HCL (PF) 1 % IJ SOLN
INTRAMUSCULAR | Status: DC | PRN
  Administered 2021-11-30 (×2): 2 mL

## 2021-11-30 MED ORDER — ACETAMINOPHEN 325 MG PO TABS: 650.0000 mg | ORAL_TABLET | ORAL | Status: DC | PRN

## 2021-11-30 MED ORDER — FENTANYL CITRATE (PF) 100 MCG/2ML IJ SOLN
INTRAMUSCULAR | Status: DC | PRN
  Administered 2021-11-30: 25 ug via INTRAVENOUS

## 2021-11-30 MED ORDER — SODIUM CHLORIDE 0.9 % IV SOLN: 250.0000 mL | INTRAVENOUS | Status: DC | PRN

## 2021-11-30 MED ORDER — ENOXAPARIN SODIUM 60 MG/0.6ML IJ SOSY
60.0000 mg | PREFILLED_SYRINGE | INTRAMUSCULAR | Status: DC
  Administered 2021-12-01: 60 mg via SUBCUTANEOUS
  Filled 2021-11-30: qty 0.6

## 2021-11-30 MED ORDER — HEPARIN SODIUM (PORCINE) 1000 UNIT/ML IJ SOLN
INTRAMUSCULAR | Status: AC
  Filled 2021-11-30: qty 10

## 2021-11-30 MED ORDER — MIDAZOLAM HCL 2 MG/2ML IJ SOLN
INTRAMUSCULAR | Status: AC
  Filled 2021-11-30: qty 2

## 2021-11-30 MED ORDER — HYDRALAZINE HCL 20 MG/ML IJ SOLN: 10.0000 mg | INTRAMUSCULAR | Status: DC | PRN

## 2021-11-30 MED ORDER — LIDOCAINE HCL (PF) 1 % IJ SOLN
INTRAMUSCULAR | Status: AC
  Filled 2021-11-30: qty 30

## 2021-11-30 MED ORDER — SODIUM CHLORIDE 0.9% FLUSH: 3.0000 mL | Freq: Two times a day (BID) | INTRAVENOUS | Status: DC

## 2021-11-30 MED ORDER — SODIUM CHLORIDE 0.9 % IV SOLN: INTRAVENOUS | Status: DC

## 2021-11-30 MED ORDER — SODIUM CHLORIDE 0.9% FLUSH: 3.0000 mL | INTRAVENOUS | Status: DC | PRN

## 2021-11-30 MED ORDER — ENOXAPARIN SODIUM 40 MG/0.4ML IJ SOSY: 40.0000 mg | PREFILLED_SYRINGE | INTRAMUSCULAR | Status: DC

## 2021-11-30 MED ORDER — ADULT MULTIVITAMIN W/MINERALS CH
1.0000 | ORAL_TABLET | Freq: Every day | ORAL | Status: DC
  Administered 2021-11-30 – 2021-12-01 (×2): 1 via ORAL
  Filled 2021-11-30 (×2): qty 1

## 2021-11-30 MED ORDER — ONDANSETRON HCL 4 MG/2ML IJ SOLN: 4.0000 mg | Freq: Four times a day (QID) | INTRAMUSCULAR | Status: DC | PRN

## 2021-11-30 MED ORDER — INSULIN ASPART 100 UNIT/ML IJ SOLN
0.0000 [IU] | Freq: Three times a day (TID) | INTRAMUSCULAR | Status: DC
  Administered 2021-12-01: 1 [IU] via SUBCUTANEOUS
  Administered 2021-12-01: 7 [IU] via SUBCUTANEOUS

## 2021-11-30 MED ORDER — HEPARIN SODIUM (PORCINE) 1000 UNIT/ML IJ SOLN
INTRAMUSCULAR | Status: DC | PRN
  Administered 2021-11-30: 6000 [IU] via INTRAVENOUS

## 2021-11-30 SURGICAL SUPPLY — 11 items
CATH 5FR JL3.5 JR4 ANG PIG MP (CATHETERS) ×1 IMPLANT
CATH BALLN WEDGE 5F 110CM (CATHETERS) ×1 IMPLANT
CATH INFINITI 5FR AL1 (CATHETERS) ×1 IMPLANT
DEVICE RAD COMP TR BAND LRG (VASCULAR PRODUCTS) ×1 IMPLANT
GLIDESHEATH SLEND SS 6F .021 (SHEATH) ×1 IMPLANT
GUIDEWIRE .025 260CM (WIRE) ×1 IMPLANT
GUIDEWIRE INQWIRE 1.5J.035X260 (WIRE) IMPLANT
INQWIRE 1.5J .035X260CM (WIRE) ×2
PACK CARDIAC CATHETERIZATION (CUSTOM PROCEDURE TRAY) ×2 IMPLANT
SHEATH GLIDE SLENDER 4/5FR (SHEATH) ×1 IMPLANT
TRANSDUCER W/STOPCOCK (MISCELLANEOUS) ×2 IMPLANT

## 2021-11-30 NOTE — Discharge Instructions (Signed)

## 2021-11-30 NOTE — Progress Notes (Signed)
Initial Nutrition Assessment ? ?DOCUMENTATION CODES:  ? ?Not applicable ? ?INTERVENTION:  ? ?Multivitamin w/ minerals daily ?Diet education ?Adjust diet to Carb Modified with 1200 mL fluid restriction ? ?NUTRITION DIAGNOSIS:  ? ?Increased nutrient needs related to wound healing, acute illness as evidenced by estimated needs. ? ?GOAL:  ? ?Patient will meet greater than or equal to 90% of their needs ? ?MONITOR:  ? ?PO intake, Labs, Weight trends, I & O's, Skin ? ?REASON FOR ASSESSMENT:  ? ?Consult ?Assessment of nutrition requirement/status ? ?ASSESSMENT:  ? ?51 y.o. male presented to [facility] with shortness of breath and scrotal swelling. PMH includes T2DM and HTN. Pt admitted with suspected new CHF and poorly controlled T2DM.  ? ?4/20 - R&L Heart Cath w/ coronary angiography ? ?Pt resting in bed.  ? ?Pt reports that his appetite was good PTA and currently. Pt reports that he drinks regular sodas and sweet teas at home, but knows that will have to change. Pt reports that he was eating tube salad that he made with [employer].  ?Per EMR, pt intake includes: ?4/17: Lunch 100%, Dinner 100% ?4/18: Breakfast 75%, Dinner 75% ?4/19: Breakfast 100%, Lunch 100%, Dinner 100% ? ?Discussed getting his diabetes controlled to help with the healing of his foot. Reviewed Plate Method with the pt. Also discussed that protein is important.  ?RD informed pt that Plate Method handout will be added to discharge instructions.  ? ?Pt denies any recent weight loss. No weights within the past year to assess pt weight loss.  ? ?Medications reviewed and include: SSI 0-9 units q4h, Novolog 70/30 15 units BID, Spironolactone  ?Labs reviewed: Sodium 131, Magnesium 1.6, Hgb A1c 12.4%, 24 hr CBG 109-364 ? ?UOP: 11625 mL x 24 hr ?I/O: -25676 mL since admit  ? ?Diet Order:   ?Diet Order   ? ?       ?  Diet renal/carb modified with fluid restriction Diet-HS Snack? Nothing; Fluid restriction: 1200 mL Fluid; Room service appropriate? Yes; Fluid  consistency: Thin  Diet effective now       ?  ? ?  ?  ? ?  ? ? ?EDUCATION NEEDS:  ? ?Education needs have been addressed ? ?Skin:  Skin Assessment: Skin Integrity Issues: ?Skin Integrity Issues:: Other (Comment) ?Other: R Foot - surgical incision from 7/22 ? ?Last BM:  4/18 ? ?Height:  ? ?Ht Readings from Last 1 Encounters:  ?11/26/21 5\' 11"  (1.803 m)  ? ? ?Weight:  ? ?Wt Readings from Last 1 Encounters:  ?11/29/21 124.1 kg  ? ? ?Ideal Body Weight:  78.2 kg ? ?BMI:  Body mass index is 38.16 kg/m?. ? ?Estimated Nutritional Needs:  ? ?Kcal:  2200-2400 ? ?Protein:  110-125 grams ? ?Fluid:  >/= 2.2 L ? ? ? ?12/01/21 RD, LDN ?Clinical Dietitian ?See AMiON for contact information.  ? ?

## 2021-11-30 NOTE — Progress Notes (Signed)
Pt adamant about being discharged "first thing in the morning since my cath showed no blockages".  ?

## 2021-11-30 NOTE — Progress Notes (Signed)
Pt c/o multiple alarms, lines, and interventions. MD paged to change CBG check frequency per Diabetes Coordinator note on 4/17. Will continue to monitor. ?

## 2021-11-30 NOTE — TOC CM/SW Note (Signed)
HF TOC CM spoke to pt and states he lives in camper behind his father's home. States he was going to [facility] Dept for care. Appt scheduled at [facility] Dept with Dr Lyn Records on 12/07/2021 at 1015 am, bring your ID, proof of income and discharge paperwork with list of medications. Pt reports having a walker at home. Will use MATCH and HF funds for medications. Will have meds come from [facility] pharmacy at dc. [place] HF Paramedicine team referral sent.  ? ?[name] RN3 CCM, Heart Failure TOC CM [phone]  ?

## 2021-11-30 NOTE — Progress Notes (Signed)
?  Progress Note ? ? ?Patient: Dakota Ramirez JOA:[id number] DOB: [birth year] DOA: 11/26/2021     4 ?DOS: the patient was seen and examined on 11/30/2021 ?  ?Brief hospital course: ?Dakota Ramirez was admitted to the hospital with the working diagnosis of decompensated heart failure.  ? ?51 yo male with the past medical history of T2DM and hypertension who presented with dyspnea. Reported several days of worsening symptoms of dyspnea and edema, including abdomen and testicles. On his initial physical examination his blood pressure was 136/104, HR 110 RR 19 and 02 saturation 96%, lungs with no wheezing or rales, heart with S1 and S2 present and rhythmic, abdomen distended and positive lower extremity edema, extending to abdomen. Positive significant scrotal edema.  ? ? Na 132, K 4,0 CL 98, bicarbonate 27, glucose 306, bun 22 cr 1,0 ?BNP 398  ?Wbc 6,1 hgb 12,9 plt 286  ?Urine analysis with pH 6,0, SG 1,020, glucose 250  ? ?Chest radiograph with cardiomegaly, with hilar vascular congestion and small right pleural effusion.  ? ?EKG 111 bpm, right axis deviation, normal intervals, sinus rhythm, with no significant ST segment or T wave changes.  ? ?Patient was placed on furosemide for diuresis.  ?Echocardiogram with reduced LV systolic function 20 to 25%.  ? ?Further work up with cardiac catheterization.  ? ?Assessment and Plan: ?* CHF (congestive heart failure) (HCC) ?Echocardiogram with reduced LV systolic function with EF 20 to 25%, global hypokinesis, moderate reduction in RV systolic function. RVSP 39,8 mmHg. Mild to moderate mitral valve regurgitation.  ? ?Urine output over last 24 hrs is 11,625 ml ?With significant improvement in volume status. ?Blood pressure systolic 96 to 114 mmHg range.  ? ?On digoxin, losartan and spironolactone. ?Possible starting low dose beta blocker before his discharge and add SGLT2 i ? ?Follow on cardiac catheterization results.  ? ?Hyponatremia ?Hypomagnesemia.  ? ?Stable renal function with serum  cr at 0,97 with K at 4,2 and serum bicarbonate at 32, Na is 131.  ? ?Continue follow up renal function, holding diuretic therapy for now.  ?Avoid hypotension and nephrotoxic medications.  ? ?Essential hypertension ?On losartan and spironolactone.  ?Holding loop diuretic for now.  ? ?Type 2 diabetes mellitus (HCC) ?Fasting glucose is 180 ?Continue insulin sliding scale along with 70/30 15 units bid.  ?Capillary glucose 185, 140, 109.  ? ?PVD (peripheral vascular disease) (HCC) ?Right foot transmetatarsal amputation. ?Continue local wound care.  ?Surgery was performed 07/22.  ? ?Class 2 obesity ?Calculated BMI is 38.1  ? ? ? ? ?  ? ?Subjective: Patient is feeling better, no dyspnea and edema have improved.  ? ?Physical Exam: ?Vitals:  ? 11/30/21 1105 11/30/21 1110 11/30/21 1115 11/30/21 1128  ?BP: 133/70 105/70 117/75 114/84  ?Pulse: (!) 102 (!) 101 (!) 101 (!) 101  ?Resp: 17 12 18 18   ?Temp:    97.8 ?F (36.6 ?C)  ?TempSrc:    Oral  ?SpO2: 99% 98%  99%  ?Weight:      ?Height:      ? ?Neurology awake and alert ?ENT with no pallor ?Cardiovascular with S1 and S2 present and rhythmic ?Respiratory with no wheezing ?Abdomen not distended ?No lower extremity edema  ?Data Reviewed: ? ? ? ?Family Communication: no family at the bedside  ? ?Disposition: ?Status is: Inpatient ?Remains inpatient appropriate because: heart failure  ? Planned Discharge Destination: Home ? ? ? ?Author: ? , MD ?11/30/2021 2:10 PM ? ?For on call review [email or web address].  ?

## 2021-11-30 NOTE — Progress Notes (Addendum)
? ? Advanced Heart Failure Rounding Note ? ?PCP-Cardiologist: None  ? ?Subjective:   ?Weight 309-->273.6  ? ?Brisk diuresis noted. Negative > 11 liters. Weight down another 24 pounds.  ? ?Denies SOB.  ? ?Objective:   ?Weight Range: ?124.1 kg ?Body mass index is 38.16 kg/m?.  ? ?Vital Signs:   ?Temp:  [98.1 ?F (36.7 ?C)-98.7 ?F (37.1 ?C)] 98.4 ?F (36.9 ?C) (04/20 0457) ?Pulse Rate:  [103-112] 106 (04/20 2595) ?Resp:  [16-19] 18 (04/20 6387) ?BP: (102-131)/(72-94) 125/94 (04/20 5643) ?SpO2:  [95 %-100 %] 100 % (04/20 0014) ?Last BM Date : 11/28/21 ? ?Weight change: ?Filed Weights  ? 11/27/21 0339 11/28/21 0416 11/29/21 0409  ?Weight: (!) 140 kg 134.8 kg 124.1 kg  ? ? ?Intake/Output:  ? ?Intake/Output Summary (Last 24 hours) at 11/30/2021 0847 ?Last data filed at 11/30/2021 0459 ?Gross per 24 hour  ?Intake 761.88 ml  ?Output 9975 ml  ?Net -9213.12 ml  ?  ? ? ?Physical Exam  ?CVP 2 personally checked ?General:  Well appearing. No resp difficulty ?HEENT: normal ?Neck: supple. no JVD. Carotids 2+ bilat; no bruits. No lymphadenopathy or thryomegaly appreciated. ?Cor: PMI nondisplaced. Regular rate & rhythm. No rubs, gallops or murmurs. ?Lungs: clear ?Abdomen: soft, nontender, nondistended. No hepatosplenomegaly. No bruits or masses. Good bowel sounds. ?Extremities: no cyanosis, clubbing, rash, edema. R foot dressing.  ?Neuro: alert & orientedx3, cranial nerves grossly intact. moves all 4 extremities w/o difficulty. Affect pleasant ? ?Telemetry  ? ?SR/ST 90-100s  ? ?Labs  ?  ?CBC ?Recent Labs  ?  11/28/21 ?0148 11/30/21 ?0502  ?WBC 6.8 7.3  ?HGB 12.3* 13.3  ?HCT 39.2 41.6  ?MCV 83.6 82.5  ?PLT 237 256  ? ?Basic Metabolic Panel ?Recent Labs  ?  11/29/21 ?0501 11/30/21 ?0502  ?NA 134* 131*  ?K 3.6 4.2  ?CL 93* 92*  ?CO2 34* 32  ?GLUCOSE 166* 180*  ?BUN 11 13  ?CREATININE 0.94 0.97  ?CALCIUM 8.4* 8.4*  ?MG 1.6*  --   ? ?Liver Function Tests ?Recent Labs  ?  11/29/21 ?0501  ?AST 26  ?ALT 17  ?ALKPHOS 102  ?BILITOT 0.7  ?PROT  7.5  ?ALBUMIN 2.5*  ? ?No results for input(s): LIPASE, AMYLASE in the last 72 hours. ?Cardiac Enzymes ?No results for input(s): CKTOTAL, CKMB, CKMBINDEX, TROPONINI in the last 72 hours. ? ?BNP: ?BNP (last 3 results) ?Recent Labs  ?  11/26/21 ?1314  ?BNP 398.2*  ? ? ?ProBNP (last 3 results) ?No results for input(s): PROBNP in the last 8760 hours. ? ? ?D-Dimer ?No results for input(s): DDIMER in the last 72 hours. ? ?Hemoglobin A1C ?No results for input(s): HGBA1C in the last 72 hours. ? ?Fasting Lipid Panel ?Recent Labs  ?  11/28/21 ?0148  ?CHOL 93  ?HDL 26*  ?LDLCALC 53  ?TRIG 72  ?CHOLHDL 3.6  ? ?Thyroid Function Tests ?No results for input(s): TSH, T4TOTAL, T3FREE, THYROIDAB in the last 72 hours. ? ?Invalid input(s): FREET3 ? ?Other results: ? ? ?Imaging  ? ? ?No results found. ? ? ?Medications:   ? ? ?Scheduled Medications: ? Chlorhexidine Gluconate Cloth  6 each Topical Daily  ? digoxin  0.125 mg Oral Daily  ? enoxaparin (LOVENOX) injection  60 mg Subcutaneous Q24H  ? insulin aspart  0-9 Units Subcutaneous Q4H  ? insulin aspart protamine- aspart  15 Units Subcutaneous BID WC  ? losartan  25 mg Oral QHS  ? sodium chloride flush  10-40 mL Intracatheter Q12H  ? sodium chloride  flush  3 mL Intravenous Q12H  ? spironolactone  25 mg Oral Daily  ? ? ?Infusions: ? sodium chloride    ? sodium chloride 10 mL/hr at 11/30/21 2979  ? ? ?PRN Medications: ?sodium chloride, acetaminophen **OR** acetaminophen, ALPRAZolam, ondansetron (ZOFRAN) IV, sodium chloride flush, sodium chloride flush ? ? ? ?Patient Profile  ? ?51 y.o. male with history of uncontrolled DM, HTN, osteomyelitis s/p R TMA, noncompliance with medical therapy. Admitted with new biventricular heart failure. ? ?Assessment/Plan  ? ?Acute biventricular CHF: ?-New diagnosis this admit ?-Suspect ICM. Has multiple risk factors for CAD, WMA on echo, and abnormal ECG. Reports hx CHF in his mother. No hx alcohol or drug abuse. ?-Echo EF 20-25%, regional WMA, moderately  dilated LV, RV moderately reduced, RVSP 40 mmHg, mild to moderate MR, dilated IVC with estimated RAP 15 mmHg ?-NYHA III. Massively volume overloaded on admit.  ?- CVP 2. Hold diuretics. Adjust post cath. ?-Continue digoxin 0.125 mg daily ?-Continue spiro 25 mg daily ?-Continue losartan 25 mg daily. Switch to [place] prior to discharge if BP stable. ?-No SGLT2i with uncontrolled DM and concern for hygiene ?-- R/LHC today. cMRI if no obstructive CAD to explain cardiomyopathy. ?  ?2. Uncontrolled DM: ?- A1c 12.4% ?- Has not been compliant with medications ?- Per TRH ?  ?3. Hx osteomyelitis s/p right TMA: ?- Amputation stump has been slow to heal d/t poor diabetes control and hygiene ?-Follows with Podiatry at [place] ?  ?4. HTN: ?- Improved. Anticipate switching to entresto.  ?  ?5. Hypoalbuminemia ?-Liver US with echogenic parenchyma, ? Hepatic steatosis or fibrosis. Cannot exclude cirrhosis ?-TRH further evaluating ?-? Secondary to RV failure ?  ?SDOH:  ?-Uncertain if he has medicaid. Will review with HF CSW. ?-Medication compliance an issue. Would benefit from Paramedicine at discharge.  ?-Lives in a camper behind father's house. Unemployed and has limited social support. ?  ?Cath later today.  ? ?Length of Stay: 4 ? ?Tonye Becket, NP  ?11/30/2021, 8:47 AM ? ?Advanced Heart Failure Team ?Pager [phone] (M-F; 7a - 5p)  ?Please contact [facility] for night-coverage after hours (5p -7a ) and weekends on [email or web address] ? ?Patient seen and examined with the above-signed Advanced Practice Provider and/or Housestaff. I personally reviewed laboratory data, imaging studies and relevant notes. I independently examined the patient and formulated the important aspects of the plan. I have edited the note to reflect any of my changes or salient points. I have personally discussed the plan with the patient and/or family. ? ?Has diuresed well. CVP down. Renal function stable. No CP or SOB ? ?General:  Lying flat No resp difficulty ?HEENT:  normal ?Neck: supple. no JVD. Carotids 2+ bilat; no bruits. No lymphadenopathy or thryomegaly appreciated. ?Cor: PMI nondisplaced. Tachy regular ?Lungs: clear ?Abdomen: soft, nontender, nondistended. No hepatosplenomegaly. No bruits or masses. Good bowel sounds. ?Extremities: no cyanosis, clubbing, rash, trace edema ?Neuro: alert & orientedx3, cranial nerves grossly intact. moves all 4 extremities w/o difficulty. Affect pleasant ? ?Volume status looks good. Tolerating GDMT. Will continue to titrate as needed.  ? ?For R/L cath today. 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 ? ?SW seeing for [facility] needs. Continue to work on DM2. ? ?Arvilla Meres, MD  ?10:29 AM ? ?  ?

## 2021-11-30 NOTE — Interval H&P Note (Signed)
History and Physical Interval Note: ? ?11/30/2021 ?10:26 AM ? ?Dakota Ramirez  has presented today for surgery, with the diagnosis of heart failure.  The various methods of treatment have been discussed with the patient and family. After consideration of risks, benefits and other options for treatment, the patient has consented to  Procedure(s): ?RIGHT/LEFT HEART CATH AND CORONARY ANGIOGRAPHY (N/A) and possible coronary angioplasty as a surgical intervention.  The patient's history has been reviewed, patient examined, no change in status, stable for surgery.  I have reviewed the patient's chart and labs.  Questions were answered to the patient's satisfaction.   ? ? ?Chia Rock ? ? ?

## 2021-12-01 ENCOUNTER — Encounter (HOSPITAL_COMMUNITY): Payer: Self-pay | Admitting: Internal Medicine

## 2021-12-01 ENCOUNTER — Other Ambulatory Visit (HOSPITAL_COMMUNITY): Payer: Self-pay

## 2021-12-01 LAB — BASIC METABOLIC PANEL
Anion gap: 4 — ABNORMAL LOW (ref 5–15)
BUN: 11 mg/dL (ref 6–20)
CO2: 31 mmol/L (ref 22–32)
Calcium: 8.5 mg/dL — ABNORMAL LOW (ref 8.9–10.3)
Chloride: 98 mmol/L (ref 98–111)
Creatinine, Ser: 0.85 mg/dL (ref 0.61–1.24)
GFR, Estimated: >60 mL/min (ref >60)
Glucose, Bld: 328 mg/dL — ABNORMAL HIGH (ref 70–99)
Potassium: 4.8 mmol/L (ref 3.5–5.1)
Sodium: 133 mmol/L — ABNORMAL LOW (ref 135–145)

## 2021-12-01 LAB — GLUCOSE, CAPILLARY
Glucose-Capillary: 307 mg/dL — ABNORMAL HIGH (ref 70–99)
Glucose-Capillary: 138 mg/dL — ABNORMAL HIGH (ref 70–99)

## 2021-12-01 LAB — COOXEMETRY PANEL
Carboxyhemoglobin: 1.7 % — ABNORMAL HIGH (ref 0.5–1.5)
Methemoglobin: <0.7 % (ref 0.0–1.5)
O2 Saturation: 63.6 %
Total hemoglobin: 13.4 g/dL (ref 12.0–16.0)

## 2021-12-01 MED ORDER — DIGOXIN 125 MCG PO TABS
0.1250 mg | ORAL_TABLET | Freq: Every day | ORAL | 0 refills | Status: DC
  Filled 2021-12-01: qty 30, 30d supply, fill #0

## 2021-12-01 MED ORDER — FUROSEMIDE 40 MG PO TABS
40.0000 mg | ORAL_TABLET | Freq: Every day | ORAL | 0 refills | Status: DC
  Filled 2021-12-01: qty 30, 30d supply, fill #0

## 2021-12-01 MED ORDER — SPIRONOLACTONE 25 MG PO TABS
25.0000 mg | ORAL_TABLET | Freq: Every day | ORAL | 0 refills | Status: DC
  Filled 2021-12-01: qty 30, 30d supply, fill #0

## 2021-12-01 MED ORDER — FUROSEMIDE 40 MG PO TABS
40.0000 mg | ORAL_TABLET | Freq: Every day | ORAL | Status: DC
Start: 2021-12-01 — End: 2021-12-01

## 2021-12-01 MED ORDER — SACUBITRIL-VALSARTAN 24-26 MG PO TABS
1.0000 | ORAL_TABLET | Freq: Two times a day (BID) | ORAL | Status: DC
  Administered 2021-12-01: 1 via ORAL
  Filled 2021-12-01: qty 1

## 2021-12-01 MED ORDER — INSULIN PEN NEEDLE 32G X 4 MM MISC
1.0000 | Freq: Two times a day (BID) | 0 refills | Status: AC
  Filled 2021-12-01: qty 100, 25d supply, fill #0

## 2021-12-01 MED ORDER — POTASSIUM CHLORIDE CRYS ER 10 MEQ PO TBCR: 10.0000 meq | EXTENDED_RELEASE_TABLET | Freq: Every day | ORAL | Status: DC

## 2021-12-01 MED ORDER — BLOOD GLUCOSE MONITOR SYSTEM W/DEVICE KIT
PACK | 0 refills | Status: DC
  Filled 2021-12-01: qty 1, 30d supply, fill #0

## 2021-12-01 MED ORDER — ACCU-CHEK SOFTCLIX LANCETS MISC
0 refills | Status: DC
Start: 2021-12-01 — End: 2021-12-01
  Filled 2021-12-01: qty 100, 25d supply, fill #0

## 2021-12-01 MED ORDER — INSULIN ASPART PROT & ASPART (70-30 MIX) 100 UNIT/ML PEN
15.0000 [IU] | PEN_INJECTOR | Freq: Two times a day (BID) | SUBCUTANEOUS | 0 refills | Status: DC
  Filled 2021-12-01: qty 9, 30d supply, fill #0

## 2021-12-01 MED ORDER — ACCU-CHEK GUIDE VI STRP
ORAL_STRIP | 0 refills | Status: DC
  Filled 2021-12-01: qty 100, 25d supply, fill #0

## 2021-12-01 MED ORDER — SACUBITRIL-VALSARTAN 24-26 MG PO TABS
1.0000 | ORAL_TABLET | Freq: Two times a day (BID) | ORAL | 0 refills | Status: DC
Start: 2021-12-01 — End: 2021-12-13
  Filled 2021-12-01: qty 60, 30d supply, fill #0

## 2021-12-01 NOTE — Progress Notes (Addendum)
? ? Advanced Heart Failure Rounding Note ? ?PCP-Cardiologist: Dr. Gala Romney   ? ?Subjective:   ? ?R/LHC yesterday showed normal cors, severe NICM EF 20-25% and mildly elevated filling pressures w/ normal CO.  ? ?Additional diuresis yesterday. Excellent diuresis overall, net negative 27L this admit. Wt down 50 lb. CVP 6  ? ?SCr stable, 0.85. K 4.8  ? ?Feels much better. Denies any further dyspnea. No orthopnea or PND. Currently eating a country ham, egg and cheese biscuit from [employer]. Adamant he is going home today.    ? ? ?R/LHC Findings: ?  ?Ao = 97/67 (85) ?LV = 105/25 ?RA = 8 ?RV = 37/14 ?PA = 35/22 (25) ?PCW = 20 ?Fick cardiac output/index = 5.9/2.4 ?PVR = 0.8 WU ?SVR = 1040 ?Ao sat = 97% ?PA sat = 66%, 67% ?  ?Assessment: ?1. Normal coronary arteries ?2. Severe NICM EF 20-25% ?3. Mildly elevated filling pressures with normal cardiac output ?  ? ? ?Objective:   ?Weight Range: ?117.9 kg ?Body mass index is 36.25 kg/m?.  ? ?Vital Signs:   ?Temp:  [97.8 ?F (36.6 ?C)-98.3 ?F (36.8 ?C)] 98.3 ?F (36.8 ?C) (04/21 0881) ?Pulse Rate:  [101-111] 105 (04/21 0803) ?Resp:  [10-20] 18 (04/21 0803) ?BP: (105-141)/(70-118) 126/93 (04/21 0803) ?SpO2:  [88 %-100 %] 100 % (04/21 0803) ?Weight:  [117.9 kg] 117.9 kg (04/21 0445) ?Last BM Date : 11/30/21 ? ?Weight change: ?Filed Weights  ? 11/28/21 0416 11/29/21 0409 12/01/21 0445  ?Weight: 134.8 kg 124.1 kg 117.9 kg  ? ? ?Intake/Output:  ? ?Intake/Output Summary (Last 24 hours) at 12/01/2021 0937 ?Last data filed at 12/01/2021 0800 ?Gross per 24 hour  ?Intake 1180 ml  ?Output 2600 ml  ?Net -1420 ml  ?  ? ? ?Physical Exam  ? ?CVP 6  ?General:  Well appearing, obese. No respiratory difficulty ?HEENT: normal ?Neck: supple. no JVD. Carotids 2+ bilat; no bruits. No lymphadenopathy or thyromegaly appreciated. ?Cor: PMI nondisplaced. Regular rate & rhythm. No rubs, gallops or murmurs. ?Lungs: clear ?Abdomen: soft, nontender, nondistended. No hepatosplenomegaly. No bruits or masses. Good  bowel sounds. ?Extremities: no cyanosis, clubbing, rash, edema + RUE PICC ?Neuro: alert & oriented x 3, cranial nerves grossly intact. moves all 4 extremities w/o difficulty. Affect pleasant. ? ? ?Telemetry  ? ?NSR 90s  ? ?Labs  ?  ?CBC ?Recent Labs  ?  11/30/21 ?0502 11/30/21 ?1044 11/30/21 ?1047 11/30/21 ?1048  ?WBC 7.3  --   --   --   ?HGB 13.3   < > 14.6 14.6  ?HCT 41.6   < > 43.0 43.0  ?MCV 82.5  --   --   --   ?PLT 256  --   --   --   ? < > = values in this interval not displayed.  ? ?Basic Metabolic Panel ?Recent Labs  ?  11/29/21 ?0501 11/30/21 ?0502 11/30/21 ?1044 11/30/21 ?1048 12/01/21 ?1031  ?NA 134* 131*   < > 139 133*  ?K 3.6 4.2   < > 4.2 4.8  ?CL 93* 92*  --   --  98  ?CO2 34* 32  --   --  31  ?GLUCOSE 166* 180*  --   --  328*  ?BUN 11 13  --   --  11  ?CREATININE 0.94 0.97  --   --  0.85  ?CALCIUM 8.4* 8.4*  --   --  8.5*  ?MG 1.6*  --   --   --   --   ? < > =  values in this interval not displayed.  ? ?Liver Function Tests ?Recent Labs  ?  11/29/21 ?0501  ?AST 26  ?ALT 17  ?ALKPHOS 102  ?BILITOT 0.7  ?PROT 7.5  ?ALBUMIN 2.5*  ? ?No results for input(s): LIPASE, AMYLASE in the last 72 hours. ?Cardiac Enzymes ?No results for input(s): CKTOTAL, CKMB, CKMBINDEX, TROPONINI in the last 72 hours. ? ?BNP: ?BNP (last 3 results) ?Recent Labs  ?  11/26/21 ?1314  ?BNP 398.2*  ? ? ?ProBNP (last 3 results) ?No results for input(s): PROBNP in the last 8760 hours. ? ? ?D-Dimer ?No results for input(s): DDIMER in the last 72 hours. ? ?Hemoglobin A1C ?No results for input(s): HGBA1C in the last 72 hours. ? ?Fasting Lipid Panel ?No results for input(s): CHOL, HDL, LDLCALC, TRIG, CHOLHDL, LDLDIRECT in the last 72 hours. ? ?Thyroid Function Tests ?No results for input(s): TSH, T4TOTAL, T3FREE, THYROIDAB in the last 72 hours. ? ?Invalid input(s): FREET3 ? ?Other results: ? ? ?Imaging  ? ? ?CARDIAC CATHETERIZATION ? ?Result Date: 11/30/2021 ?  The left ventricular ejection fraction is less than 25% by visual estimate.  Findings: Ao = 97/67 (85) LV = 105/25 RA = 8 RV = 37/14 PA = 35/22 (25) PCW = 20 Fick cardiac output/index = 5.9/2.4 PVR = 0.8 WU SVR = 1040 Ao sat = 97% PA sat = 66%, 67% Assessment: 1. Normal coronary arteries 2. 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 Mildly elevated filling pressures with normal cardiac output Plan/Discussion: Continue medical therapy. Arvilla Meres, MD 5:01 PM  ? ? ?Medications:   ? ? ?Scheduled Medications: ? Chlorhexidine Gluconate Cloth  6 each Topical Daily  ? digoxin  0.125 mg Oral Daily  ? enoxaparin (LOVENOX) injection  60 mg Subcutaneous Q24H  ? insulin aspart  0-5 Units Subcutaneous QHS  ? insulin aspart  0-9 Units Subcutaneous TID WC  ? insulin aspart protamine- aspart  15 Units Subcutaneous BID WC  ? losartan  25 mg Oral QHS  ? multivitamin with minerals  1 tablet Oral Daily  ? sodium chloride flush  10-40 mL Intracatheter Q12H  ? spironolactone  25 mg Oral Daily  ? ? ?Infusions: ? sodium chloride    ? ? ?PRN Medications: ?sodium chloride, acetaminophen **OR** acetaminophen, ALPRAZolam, ondansetron (ZOFRAN) IV, sodium chloride flush ? ? ? ?Patient Profile  ? ?51 y.o. male with history of uncontrolled DM, HTN, osteomyelitis s/p R TMA, noncompliance with medical therapy. Admitted with new biventricular heart failure. ? ?Assessment/Plan  ? ?Acute biventricular CHF: ?-New diagnosis this admit ?-Suspect ICM. Has multiple risk factors for CAD, WMA on echo, and abnormal ECG. Reports hx CHF in his mother. No hx alcohol or drug abuse. ?-Echo EF 20-25%, regional WMA, moderately dilated LV, RV moderately reduced, RVSP 40 mmHg, mild to moderate MR, dilated IVC with estimated RAP 15 mmHg ?-NYHA III. Massively volume overloaded on admit.  ?-R/LHC yesterday showed normal cors, severe NICM EF 20-25% and mildly elevated filling pressures w/ normal CO ?-Diuresed well, down 50 lb. CVP 6  ?-Continue digoxin 0.125 mg daily ?-Continue spiro 25 mg daily ?-On losartan 25 mg daily, tolerating ok. Switch to Entresto  24-26 mg bid ?-No SGLT2i with uncontrolled DM and concern for hygiene ?-Switch to PO Lasix, 40 mg daily  ?-Discussed importance of low sodium diet and avoidance of fast food. Advised to monitor wt daily ?  ?2. Uncontrolled DM: ?- A1c 12.4% ?- Has not been compliant with medications ?- Per TRH ?  ?3. Hx osteomyelitis s/p right  TMA: ?- Amputation stump has been slow to heal d/t poor diabetes control and hygiene ?- Follows with [facility]ry at [facility] ?  ?4. HTN: ?- Improved.  ?- GDMT per above  ?  ?5. Hypoalbuminemia ?-Liver US with echogenic parenchyma, ? Hepatic steatosis or fibrosis. Cannot exclude cirrhosis ?-TRH further evaluating ?-? Secondary to RV failure ?  ?SDOH:  ?-Uncertain if he has medicaid. Will review with HF CSW. ?-Medication compliance an issue. Would benefit from Paramedicine at discharge.  ?-Lives in a camper behind father's house. Unemployed and has limited social support. ?  ?He is adamant that he is going home today.  ? ?Cardiac Meds for discharge  ?Digoxin 0.125 mg daily  ?Entresto 24-26 mg bid  ?Spironolactone 25 mg daily  ?Lasix 40 mg daily  ? ?Hospital f/u arranged in the [facility]. Appt info in AVS  ? ? ?Length of Stay: 5 ? ?Robbie Lis, PA-C  ?12/01/2021, 9:37 AM ? ?Advanced Heart Failure Team ?Pager [phone] (M-F; 7a - 5p)  ?Please contact [facility] for night-coverage after hours (5p -7a ) and weekends on [email or web address] ? ?Agree with above PA note.  ? ?Patient to go home today.  Will arrange CHF followup.  Meds for discharge as noted above.  ? ?Marca Ancona ?12/01/2021 ? ?  ?

## 2021-12-01 NOTE — Discharge Summary (Addendum)
?Physician Discharge Summary ?  ?Patient: Dakota Ramirez MRN: [id number] DOB: [birth year]  ?Admit date:     11/26/2021  ?Discharge date: 12/01/21  ?Discharge Physician: Dakota Ramirez Dakota Ramirez  ? ?PCP: Patient, No Pcp Per (Inactive)  ? ?Recommendations at discharge:  ? ? Patient has been placed on heart failure guideline therapy with Entresto, and spironolactone. ?Holding on B blockade for now, patient will be discharge on digoxin ?Continue diuresis with furosemide ?Holding on SGLT2i due to uncontrolled T2DM.   ? ?Discharge Diagnoses: ?Principal Problem: ?  CHF (congestive heart failure) ([place]) ?Active Problems: ?  Hyponatremia ?  Essential hypertension ?  Type 2 diabetes mellitus ([place]) ?  PVD (peripheral vascular disease) ([place]) ?  Class 2 obesity ? ?Resolved Problems: ?  * No resolved hospital problems. * ? ?Hospital Course: ?Dakota Ramirez was admitted to the hospital with the working diagnosis of decompensated heart failure.  ? ?51 yo male with the past medical history of T2DM and hypertension who presented with dyspnea. Reported several days of worsening symptoms of dyspnea and edema, including his abdomen and testicles. On his initial physical examination his blood pressure was 136/104, HR 110 RR 19 and 02 saturation 96%, lungs with no wheezing or rales, heart with S1 and S2 present and rhythmic, abdomen distended and positive lower extremity edema, extending to his abdomen. Positive significant scrotal edema.  ? ? Na 132, K 4,0 CL 98, bicarbonate 27, glucose 306, bun 22 cr 1,0 ?BNP 398  ?Wbc 6,1 hgb 12,9 plt 286  ?Urine analysis with pH 6,0, SG 1,020, glucose 250  ? ?Chest radiograph with cardiomegaly, with hilar vascular congestion and small right pleural effusion.  ? ?EKG 111 bpm, right axis deviation, normal intervals, sinus rhythm, with no significant ST segment or T wave changes.  ? ?Patient was placed on furosemide for diuresis.  ?Echocardiogram with reduced LV systolic function 20 to 13%.  ? ?Further work up with  cardiac catheterization showed PCWP 20, cardiac output 5.9 and index 2,4. PA mean 25.  ? ?Volume status improved and patient was placed on guideline medical therapy, instruction to follow up as outpatient.  ? ? ?Assessment and Plan: ?* CHF (congestive heart failure) ([place]) ?Patient was admitted to the cardiac ward, he was placed on aggressive diuresis with IV furosemide, negative fluid balance was achieved, -27,596 ml, with significant improvement in his symptoms.  ? ?Echocardiogram with reduced LV systolic function with EF 20 to 25%, global hypokinesis, moderate reduction in RV systolic function. RVSP 39,8 mmHg. Mild to moderate mitral valve regurgitation.  ? ?Cardiac catheterization with mean PA pressure 25, PCW 20, cardiac output 5.9/ index 2.9 (Fick). Normal coronaries.  ? ?Patient will continue medical therapy with digoxin, entresto and spironolactone. ?Plan to follow up as outpatient. ?Continue diuresis with furosemide.  ?.  ? ?Hyponatremia ?Hypomagnesemia, hyponatremia ? ?His renal function remained stable with a discharge serum cr of 0.85, K is 4.8 and serum bicarbonate at 31. Na is 133.  ?Continue with spironolactone and furosemide.  ?Follow up renal function as outpatient.  ? ?Essential hypertension ?Blood pressure controlled with entresto and spironolactone.  ? ?Type 2 diabetes mellitus ([place]) ?Hyperglycemia.  ? ?Patient was placed on insulin therapy, sliding scale and basal with 70/30 with good toleration. ?At discharge will continue insulin therapy and will need close follow up.  ?Lifestyle modifications.  ? ?PVD (peripheral vascular disease) ([place]) ?Right foot transmetatarsal amputation. ?Continue local wound care.  ?Surgery was performed 07/22.  ? ?Class 2 obesity ?Calculated BMI is  38.1  ? ? ? ? ?  ? ? ?Consultants: heart failure  ?Procedures performed: cardiac catheterization   ?Disposition: Home ?Diet recommendation:  ?Cardiac diet ?DISCHARGE MEDICATION: ?Allergies as of 12/01/2021   ?No Known  Allergies ?  ? ?  ?Medication List  ?  ? ?STOP taking these medications   ? ?ibuprofen 200 MG tablet ?Commonly known as: ADVIL ?  ?lisinopril 20 MG tablet ?Commonly known as: ZESTRIL ?  ? ?  ? ?TAKE these medications   ? ?Blood Glucose Monitor System w/Device Kit ?Use to check blood sugars 4 times daily. ?  ?digoxin 0.125 MG tablet ?Commonly known as: LANOXIN ?Take 1 tablet (0.125 mg total) by mouth daily. ?Start taking on: December 02, 2021 ?  ?furosemide 40 MG tablet ?Commonly known as: LASIX ?Take 1 tablet (40 mg total) by mouth daily. ?  ?insulin aspart protamine - aspart (70-30) 100 UNIT/ML FlexPen ?Commonly known as: NOVOLOG 70/30 MIX ?Inject 15 Units into the skin 2 (two) times daily. ?  ?Insulin Pen Needle 32G X 4 MM Misc ?Use to inject insulin up to 4 times daily as directed. ?  ?sacubitril-valsartan 24-26 MG ?Commonly known as: ENTRESTO ?Take 1 tablet by mouth 2 (two) times daily. ?  ?spironolactone 25 MG tablet ?Commonly known as: ALDACTONE ?Take 1 tablet (25 mg total) by mouth daily. ?Start taking on: December 02, 2021 ?  ? ?  ? ?  ?  ? ? ?  ?Discharge Care Instructions  ?(From admission, onward)  ?  ? ? ?  ? ?  Start     Ordered  ? 12/01/21 0000  Discharge wound care:       ?Comments: Wound care to right foot, transmetatarsal head amputation site: Cleanse with soap and water, rinse and pat dry. Cover lesion with folded piece of xeroform gauze, top with dry gauze, ABD pad and secure with Kerlix roll gauze/paper tape and Ace bandage. Change daily and as needed dressing dislodgment.  ? 12/01/21 1130  ? ?  ?  ? ?  ? ? Follow-up Information   ? ? [facility] Follow up.   ?Why: appt with Dr Dakota Ramirez on 12/07/2021 at 1015 am, bring your ID, proof of income and discharge paperwork with list of medications ?Contact information: ?[address] ?[place] [address] ?[phone] ? ? ?  ?  ? ? [place] Follow up on 12/13/2021.   ?Specialty: Cardiology ?Why:  at  2:30 Located at [facility]. Entrance C. ?Contact information: ?[address] ?[id number] mc ?[place] ?[phone] ? ?  ?  ? ?  ?  ? ?  ? ?Discharge Exam: ?Filed Weights  ? 11/28/21 0416 11/29/21 0409 12/01/21 0445  ?Weight: 134.8 kg 124.1 kg 117.9 kg  ? ?BP 132/89 (BP Location: Left Arm)   Pulse (!) 105   Temp 97.8 ?F (36.6 ?C) (Oral)   Resp 18   Ht _0  (1.803 m)   Wt 117.9 kg   SpO2 100%   BMI 36.25 kg/m?  ? ?Patient is feeling better, no dyspnea or chest pain, no edema ? ?Neurology awake and alert ?ENT with no pallor ?Cardiovascular with S1 and S2 present and rhythmic with no gallops, rubs or murmurs ?No JVD ?No lower extremity edema ?Respiratory with no wheezing or rhonchi ?Abdomen not distended  ?Right transmetatarsal amputation.,  ? ?Condition at discharge: stable ? ?The results of significant diagnostics from this hospitalization (including imaging, microbiology, ancillary  and laboratory) are listed below for reference.  ? ?Imaging Studies: ?DG Chest 2 View ? ?Result Date: 11/26/2021 ?CLINICAL DATA:  Scrotal edema, fluid overload, shortness of breath EXAM: CHEST - 2 VIEW COMPARISON:  03/16/2019. FINDINGS: There is significant interval increase in transverse diameter of heart. Possibility of pericardial effusion is not excluded. Central pulmonary vessels are prominent without signs of alveolar pulmonary edema. There are small linear densities in right lower lung fields. There is no focal pulmonary consolidation. There is blunting of right lateral costophrenic angle. There is no pneumothorax. IMPRESSION: There is significant interval increase in transverse diameter of heart. Possibility of pericardial effusion is not excluded. Central pulmonary vessels are prominent without signs of alveolar pulmonary edema. There are linear densities in right lower lung fields suggesting subsegmental atelectasis. Blunting of right lateral CP angle suggests small right pleural  effusion. Electronically Signed   By: Elmer Picker M.D.   On: 11/26/2021 13:30  ? ?CT Angio Chest PE W and/or Wo Contrast ? ?Result Date: 11/26/2021 ?CLINICAL DATA:  Shortness of breath.  Right-sided chest swelling.

## 2021-12-01 NOTE — Progress Notes (Signed)
PT Cancellation Note ? ?Patient Details ?Name: Dakota Ramirez ?MRN: [id number] ?DOB: [birth year] ? ? ?Cancelled Treatment:    Reason Eval/Treat Not Completed: Patient declined, no reason specified (Pt declines PT, states he just wants to leave.  PT reminds pt that he hasn't practiced stairs yet and encourages him to practice but pt continues to refuse.  No PT rendered at this time.) ? ?Jorge Amparo A. Robt Okuda, PT, DPT ?Acute Rehabilitation Services ?Office: [phone]  ?[place] ?12/01/2021, 10:40 AM ?

## 2021-12-01 NOTE — TOC Transition Note (Signed)
Transition of Care (TOC) - CM/SW Discharge Note ? ? ?Patient Details  ?Name: Dakota Ramirez ?MRN: [id number] ?Date of Birth: 1970/12/17 ? ?Transition of Care (TOC) CM/SW Contact:  ?Leone Haven, RN ?Phone Number: ?12/01/2021, 12:23 PM ? ? ?Clinical Narrative:    ?Patient  is for dc today, his friend is here at the bedside and will be transporting him home.  TOC to bring meds to room prior to dc.  He has no other needs.  ? ? ?  ?Barriers to Discharge: Continued Medical Work up ? ? ?Patient Goals and CMS Choice ?Patient states their goals for this hospitalization and ongoing recovery are:: wants to go home ?CMS Medicare.gov Compare Post Acute Care list provided to:: Patient ?Choice offered to / list presented to : Patient ? ?Discharge Placement ?  ?           ?  ?  ?  ?  ? ?Discharge Plan and Services ?  ?Discharge Planning Services: CM Consult ?           ?  ?  ?  ?  ?  ?  ?  ?  ?  ?  ? ?Social Determinants of Health (SDOH) Interventions ?Food Insecurity Interventions: Intervention Not Indicated ?Housing Interventions: Intervention Not Indicated ? ? ?Readmission Risk Interventions ?   ? View : No data to display.  ?  ?  ?  ? ? ? ? ? ?

## 2021-12-01 NOTE — Progress Notes (Signed)
Went over discharge instructions with patient at the bedside. Patient made aware of medication changes and follow up appointments. RN educated on CHF- daily weights and maintaining low salt heart healthy diet. RN reviewed wound care instructions. PICC line has been removed and site is clean, dry and intact. Tele monitor removed by patient and CCMD has been notified. Awaiting TOC meds. Transportation at bedside.  ?

## 2021-12-01 NOTE — Progress Notes (Signed)
TRH night cross cover note: ? ?I was notified by RN of patient's complaint regarding the frequency of CBG checks noting that they are currently ordered on a every 4 hour basis with corresponding every 4 hours sliding scale insulin.  ? ?In reviewing CBG results throughout the day shift, range of corresponding blood sugars noted to be 109 - 287.  ? ?I subsequently changed existing CBG/sliding scale insulin coverage orders to qAC/HS w/ corresponding SSI coverage.  ? ? ? ? ?Dakota Pigg, DO ?Hospitalist ? ?

## 2021-12-04 ENCOUNTER — Telehealth (HOSPITAL_COMMUNITY): Payer: Self-pay | Admitting: Licensed Clinical Social Worker

## 2021-12-04 NOTE — Telephone Encounter (Signed)
Referral sent into [facility]- fax confirmation received ? ?Burna Sis, LCSW ?Clinical Social Worker ?Advanced Heart Failure Clinic ?Desk#: [phone] ?Cell#: [phone] ? ?

## 2021-12-13 ENCOUNTER — Ambulatory Visit (HOSPITAL_COMMUNITY)
Admit: 2021-12-13 | Discharge: 2021-12-13 | Disposition: A | Discharge status: Home / Self Care | Payer: Self-pay | Source: Ambulatory Visit | Attending: Family Medicine | Admitting: Family Medicine

## 2021-12-13 ENCOUNTER — Encounter (HOSPITAL_COMMUNITY): Payer: Self-pay

## 2021-12-13 VITALS — BP 140/96 | HR 112 | Wt 250.8 lb

## 2021-12-13 DIAGNOSIS — Z794 Long term (current) use of insulin: Secondary | ICD-10-CM | POA: Insufficient documentation

## 2021-12-13 DIAGNOSIS — Z560 Unemployment, unspecified: Secondary | ICD-10-CM | POA: Insufficient documentation

## 2021-12-13 DIAGNOSIS — E1169 Type 2 diabetes mellitus with other specified complication: Secondary | ICD-10-CM | POA: Insufficient documentation

## 2021-12-13 DIAGNOSIS — I5022 Chronic systolic (congestive) heart failure: Secondary | ICD-10-CM | POA: Insufficient documentation

## 2021-12-13 DIAGNOSIS — E8809 Other disorders of plasma-protein metabolism, not elsewhere classified: Secondary | ICD-10-CM | POA: Insufficient documentation

## 2021-12-13 DIAGNOSIS — I110 Hypertensive heart disease with heart failure: Secondary | ICD-10-CM | POA: Insufficient documentation

## 2021-12-13 DIAGNOSIS — Z139 Encounter for screening, unspecified: Secondary | ICD-10-CM

## 2021-12-13 DIAGNOSIS — I428 Other cardiomyopathies: Secondary | ICD-10-CM | POA: Insufficient documentation

## 2021-12-13 DIAGNOSIS — B999 Unspecified infectious disease: Secondary | ICD-10-CM | POA: Insufficient documentation

## 2021-12-13 DIAGNOSIS — I10 Essential (primary) hypertension: Secondary | ICD-10-CM

## 2021-12-13 DIAGNOSIS — E1165 Type 2 diabetes mellitus with hyperglycemia: Secondary | ICD-10-CM | POA: Insufficient documentation

## 2021-12-13 DIAGNOSIS — Z91148 Patient's other noncompliance with medication regimen for other reason: Secondary | ICD-10-CM | POA: Insufficient documentation

## 2021-12-13 DIAGNOSIS — Z8249 Family history of ischemic heart disease and other diseases of the circulatory system: Secondary | ICD-10-CM | POA: Insufficient documentation

## 2021-12-13 DIAGNOSIS — Z79899 Other long term (current) drug therapy: Secondary | ICD-10-CM | POA: Insufficient documentation

## 2021-12-13 DIAGNOSIS — E1159 Type 2 diabetes mellitus with other circulatory complications: Secondary | ICD-10-CM

## 2021-12-13 DIAGNOSIS — I5082 Biventricular heart failure: Secondary | ICD-10-CM | POA: Insufficient documentation

## 2021-12-13 DIAGNOSIS — Z8739 Personal history of other diseases of the musculoskeletal system and connective tissue: Secondary | ICD-10-CM

## 2021-12-13 DIAGNOSIS — L03115 Cellulitis of right lower limb: Secondary | ICD-10-CM | POA: Insufficient documentation

## 2021-12-13 LAB — CBC
HCT: 43.3 % (ref 39.0–52.0)
Hemoglobin: 14 g/dL (ref 13.0–17.0)
MCH: 26.5 pg (ref 26.0–34.0)
MCHC: 32.3 g/dL (ref 30.0–36.0)
MCV: 81.9 fL (ref 80.0–100.0)
Platelets: 231 10*3/uL (ref 150–400)
RBC: 5.29 MIL/uL (ref 4.22–5.81)
RDW: 14.4 % (ref 11.5–15.5)
WBC: 7.2 10*3/uL (ref 4.0–10.5)
nRBC: 0 % (ref 0.0–0.2)

## 2021-12-13 LAB — BASIC METABOLIC PANEL
Anion gap: 8 (ref 5–15)
BUN: 20 mg/dL (ref 6–20)
CO2: 29 mmol/L (ref 22–32)
Calcium: 8.9 mg/dL (ref 8.9–10.3)
Chloride: 97 mmol/L — ABNORMAL LOW (ref 98–111)
Creatinine, Ser: 0.91 mg/dL (ref 0.61–1.24)
GFR, Estimated: >60 mL/min (ref >60)
Glucose, Bld: 220 mg/dL — ABNORMAL HIGH (ref 70–99)
Potassium: 4 mmol/L (ref 3.5–5.1)
Sodium: 134 mmol/L — ABNORMAL LOW (ref 135–145)

## 2021-12-13 LAB — BRAIN NATRIURETIC PEPTIDE: B Natriuretic Peptide: 247.4 pg/mL — ABNORMAL HIGH (ref 0.0–100.0)

## 2021-12-13 LAB — DIGOXIN LEVEL: Digoxin Level: 0.4 ng/mL — ABNORMAL LOW (ref 0.8–2.0)

## 2021-12-13 MED ORDER — DOXYCYCLINE HYCLATE 100 MG PO TABS
100.0000 mg | ORAL_TABLET | Freq: Two times a day (BID) | ORAL | 0 refills | Status: AC
Start: 2021-12-13 — End: 2021-12-20

## 2021-12-13 MED ORDER — ENTRESTO 49-51 MG PO TABS: 1.0000 | ORAL_TABLET | Freq: Two times a day (BID) | ORAL | 11 refills | Status: DC

## 2021-12-13 NOTE — Patient Instructions (Signed)
EKG done today. ? ?Labs done today. We will contact you only if your labs are abnormal. ? ?INCREASE Entresto to 49-51mg  (1 tablet) by mouth 2 times daily.  ? ?START Doxycycline 100mg  (1 tablet) by mouth 2 times daily for 7 days.  ? ?No other medication changes were made. Please continue all current medications as prescribed. ? ?Your physician recommends that you schedule a follow-up appointment in: 3 weeks with our NP/PA Clinic here in our office and in 3-4 months with Dr. Vaughan Browner with an echo prior to your exam.  ? ?Your physician has requested that you have an echocardiogram. Echocardiography is a painless test that uses sound waves to create images of your heart. It provides your doctor with information about the size and shape of your heart and how well your heart?s chambers and valves are working. This procedure takes approximately one hour. There are no restrictions for this procedure. ? ?If you have any questions or concerns before your next appointment please send [place] a message through [place] or call our office at [phone].   ? ?TO LEAVE A MESSAGE FOR THE NURSE SELECT OPTION 2, PLEASE LEAVE A MESSAGE INCLUDING: ?YOUR NAME ?DATE OF BIRTH ?CALL BACK NUMBER ?REASON FOR CALL**this is important as we prioritize the call backs ? ?YOU WILL RECEIVE A CALL BACK THE SAME DAY AS LONG AS YOU CALL BEFORE 4:00 PM ? ? ?Do the following things EVERYDAY: ?Weigh yourself in the morning before breakfast. Write it down and keep it in a log. ?Take your medicines as prescribed ?Eat low salt foods--Limit salt (sodium) to 2000 mg per day.  ?Stay as active as you can everyday ?Limit all fluids for the day to less than 2 liters ? ? ?At the [facility], you and your health needs are our priority. As part of our continuing mission to provide you with exceptional heart care, we have created designated Provider Care Teams. These Care Teams include your primary Cardiologist (physician) and Advanced Practice  Providers (APPs- Physician Assistants and Nurse Practitioners) who all work together to provide you with the care you need, when you need it.  ? ?You may see any of the following providers on your designated Care Team at your next follow up: ?Dr Glori Bickers ?Dr Loralie Champagne ?Darrick Grinder, NP ?Lyda Jester, PA ?Audry Riles, PharmD ? ? ?Please be sure to bring in all your medications bottles to every appointment.  ? ?

## 2021-12-13 NOTE — Progress Notes (Signed)
? ?ADVANCED HF CLINIC CONSULT NOTE ? ? ?Primary Care: Patient, No Pcp Per (Inactive) ?HF Cardiologist: Dr. Haroldine Laws ? ?HPI: ?Dakota Ramirez is a 51 y.o. 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 No prior cardiac history.  ?  ?Admitted 4/23 with new acute HF.  Given IV lasix. Echo showed  EF 20-25%, regional WMA, moderately dilated LV, RV moderately reduced, RVSP 40 mmHg, mild to moderate MR, dilated IVC with estimated RAP 15 mmHg. Cards consulted and GDMT started. 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  Overall diuresed 50 lbs. No SGLT2i with uncontrolled DM. Discharged home, weight 259 lbs.  ? ?Today he returns for post hospital HF follow up. Overall feeling fatigued, but breathing much improved. He does not know if he snores. Denies palpitations, CP, dizziness, edema, or PND/Orthopnea. Appetite ok. No fever or chills. He does not weigh at home. Taking all medications. Down 50 lbs from recent admission. No ETOH, tobacco or drug use. Currently unemployed. Lives in a camper behind his father's house with his dog.  ? ?Cardiac Studies: ?- Echo (4/23): EF 20-25%, regional WMA, moderately dilated LV, RV moderately reduced, RVSP 40 mmHg, mild to moderate MR, dilated IVC with estimated RAP 15 mmHg. ? ?- R/LHC (4/23):  ?  The left ventricular ejection fraction is less than 25% by visual estimate. ? ?Ao = 97/67 (85) ?LV = 105/25 ?RA = 8 ?RV = 37/14 ?PA = 35/22 (25) ?PCW = 20 ?Fick cardiac output/index = 5.9/2.4 ?PVR = 0.8 WU ?SVR = 1040 ?Ao sat = 97% ?PA sat = 66%, 67% ?  ?1. Normal coronary arteries ?2. Severe NICM EF 20-25% ?3. Mildly elevated filling pressures with normal cardiac output ?  ? ?Review of Systems: [y] = yes, _0  = no  ? ?General: Weight gain _1 ; Weight loss _2 ; Anorexia _3 ; Fatigue Blue.Reese ]; Fever _4 ; Chills _5 ; Weakness _6   ?Cardiac: Chest pain/pressure _7 ; Resting  SOB _8 ; Exertional SOB _9 ; Orthopnea _10 ; Pedal Edema _11 ; Palpitations _12 ; Syncope _13 ; Presyncope _14 ; Paroxysmal nocturnal dyspnea_15   ?Pulmonary: Cough _16 ; Wheezing_17 ; Hemoptysis_18 ; Sputum _19 ; Snoring _20   ?GI: Vomiting_21 ; Dysphagia_22 ; Melena_23 ; Hematochezia _24 ; Heartburn_25 ; Abdominal pain _26 ; Constipation _27 ; Diarrhea _28 ; BRBPR _29   ?GU: Hematuria_30 ; Dysuria _31 ; Nocturia_32   ?Vascular: Pain in legs with walking _33 ; Pain in feet with lying flat _34 ; Non-healing sores Blue.Reese ]; Stroke _35 ; TIA _36 ; Slurred speech _37 ;  ?Neuro: Headaches_38 ; Vertigo_39 ; Seizures_40 ; Paresthesias_41 ;Blurred vision _42 ; Diplopia _43 ; Vision changes _44   ?Ortho/Skin: Arthritis _45 ; Joint pain _46 ; Muscle pain _47 ; Joint swelling _48 ; Back Pain _49 ; Rash _50   ?Psych: Depression_51 ; Anxiety_52   ?Heme: Bleeding problems _53 ; Clotting disorders _54 ; Anemia _55   ?Endocrine: Diabetes [ y]; Thyroid dysfunction_56  ? ?Past Medical History:  ?Diagnosis Date  ? Diabetes mellitus without complication ([place])   ? Hypertension   ? ?Current Outpatient Medications  ?Medication Sig Dispense Refill  ? Blood Glucose Monitoring Suppl (BLOOD GLUCOSE MONITOR SYSTEM) w/Device KIT Use to check blood sugars 4 times daily. 1 kit 0  ? digoxin (LANOXIN) 0.125 MG tablet Take 1 tablet (0.125 mg total)  by mouth daily. 30 tablet 0  ? furosemide (LASIX) 40 MG tablet Take 1 tablet (40 mg total) by mouth daily. 30 tablet 0  ? insulin aspart protamine - aspart (NOVOLOG 70/30 MIX) (70-30) 100 UNIT/ML FlexPen Inject 15 Units into the skin 2 (two) times daily. 9 mL 0  ? Insulin Pen Needle 32G X 4 MM MISC Use to inject insulin up to 4 times daily as directed. 100 each 0  ? sacubitril-valsartan (ENTRESTO) 24-26 MG Take 1 tablet by mouth 2 (two) times daily. 60 tablet 0  ? spironolactone (ALDACTONE) 25 MG tablet Take 1 tablet (25 mg total) by mouth daily. 30 tablet 0  ? ?No current facility-administered medications for this encounter.  ? ?No Known  Allergies ? ?Social History  ? ?Socioeconomic History  ? Marital status: Single  ?  Spouse name: Not on file  ? Number of children: Not on file  ? Years of education: Not on file  ? Highest education level: Not on file  ?Occupational History  ? Not on file  ?Tobacco Use  ? Smoking status: Never  ? Smokeless tobacco: Never  ?Vaping Use  ? Vaping Use: Never used  ?Substance and Sexual Activity  ? Alcohol use: Never  ? Drug use: Never  ? Sexual activity: Not on file  ?Other Topics Concern  ? Not on file  ?Social History Narrative  ? Not on file  ? ?Social Determinants of Health  ? ?Financial Resource Strain: High Risk  ? Difficulty of Paying Living Expenses: Hard  ?Food Insecurity: No Food Insecurity  ? Worried About Charity fundraiser in the Last Year: Never true  ? Ran Out of Food in the Last Year: Never true  ?Transportation Needs: No Transportation Needs  ? Lack of Transportation (Medical): No  ? Lack of Transportation (Non-Medical): No  ?Physical Activity: Not on file  ?Stress: Not on file  ?Social Connections: Not on file  ?Intimate Partner Violence: Not on file  ? ? ?Family History  ?Problem Relation Age of Onset  ? Diabetes Mother   ? Hypertension Mother   ? ?BP (!) 140/96   Pulse (!) 112   Wt 113.8 kg   SpO2 97%   BMI 34.98 kg/m?  ? ?Wt Readings from Last 3 Encounters:  ?12/13/21 113.8 kg  ?12/01/21 117.9 kg  ?09/07/19 117.9 kg  ? ?PHYSICAL EXAM: ?General:  NAD. No resp difficulty, walked into clinic. ?HEENT: Normal ?Neck: Supple. No JVD. Carotids 2+ bilat; no bruits. No lymphadenopathy or thryomegaly appreciated. ?Cor: PMI nondisplaced. Regular rate & rhythm. No rubs, gallops or murmurs. ?Lungs: Clear ?Abdomen: Obese, nontender, nondistended. No hepatosplenomegaly. No bruits or masses. Good bowel sounds. ?Extremities: No cyanosis, clubbing, edema; + RLE erythema to shin and several small sores, RLE stump wrapped with gauze and ACE wrap. ?Neuro: Alert & oriented x 3, cranial nerves grossly intact. Moves  all 4 extremities w/o difficulty. Affect pleasant. ? ?ECG: ST 110 bpm (personally reviewed) ? ?ASSESSMENT & PLAN: ?Chronic biventricular CHF: ?- New diagnosis this admit ?- NICM. Reports hx CHF in his mother. No hx alcohol or drug abuse. 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 ?- Increase Entresto to 49/51 mg bid. ?- Continue  digoxin 0.125 mg daily. ?- Continue Lasix 40 mg daily. ?- Continue spiro 25 mg daily ?- No SGLT2i with uncontrolled DM and concern for hygiene ?- Discussed importance of low sodium diet and avoidance of fast food. Advised to monitor wt daily. ?- BMET, BNP and dig level today. ?  ?2. Uncontrolled DM2: ?- A1c 12.4%. ?- On insulin. ?- Has not been compliant with medications ?  ?3. Hx osteomyelitis s/p right TMA: ?- Amputation stump has been slow to heal d/t poor diabetes control and hygiene ?- Follows with Podiatry at [facility]. ?  ?4. HTN: ?- Improved.  ?- GDMT per above.  ?  ?5. Hypoalbuminemia ?- Liver US with echogenic parenchyma, ? Hepatic steatosis or fibrosis. Cannot exclude cirrhosis ?-? Secondary to RV failure ? ?6. RLE cellulitis ?- Stary doxy 100 mg bid x 7 days. ?- He has follow up with podiatry 12/29/21 ?- CBC today. ?  ?7. SDOH:  ?- Medicaid application in process. ?- Medication compliance an issue. Needs Paramedicine, HFSW helping. ?- Lives in a camper behind father's house. Unemployed and has limited social support. ?  ?Follow up with APP in 3-4 weeks (add beta blocker) and 3-4 months with Dr. Haroldine Laws + echo. ? ?Allena Katz, FNP-BC ?12/13/21 ? ?

## 2022-01-04 ENCOUNTER — Encounter (HOSPITAL_COMMUNITY): Payer: Medicaid Other

## 2022-01-09 NOTE — Progress Notes (Signed)
ADVANCED HF CLINIC NOTE   Primary Care: Patient, No Pcp Per (Inactive) HF Cardiologist: Dr. Haroldine Laws  HPI: Dakota Ramirez is a 51 y.o. 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 No prior cardiac history.    Admitted 4/23 with new acute HF.  Given IV lasix. Echo showed  EF 20-25%, regional WMA, moderately dilated LV, RV moderately reduced, RVSP 40 mmHg, mild to moderate MR, dilated IVC with estimated RAP 15 mmHg. Cards consulted and GDMT started. 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  Overall diuresed 50 lbs. No SGLT2i with uncontrolled DM. Discharged home, weight 259 lbs.   Post hospital follow up, NYHA II, volume OK. Entresto increased and paramedicine arranged to help with med compliance.  Today he returns for HF follow up. Overall feeling fine, has some fatigue. No SOB with walking, not very active due to TMA. Denies CP, dizziness, edema, or PND/Orthopnea. Appetite ok. No fever or chills. Not weighing at home, not taking medications regularly (~50% compliance). Unemployed, lives in a camper behind father's house with his dog, Dakota Ramirez. No drug/ETOH/tobacco use.   Cardiac Studies: - Echo (4/23): EF 20-25%, regional WMA, moderately dilated LV, RV moderately reduced, RVSP 40 mmHg, mild to moderate MR, dilated IVC with estimated RAP 15 mmHg.  - R/LHC (4/23):    The left ventricular ejection fraction is less than 25% by visual estimate.  Ao = 97/67 (85) LV = 105/25 RA = 8 RV = 37/14 PA = 35/22 (25) PCW = 20 Fick cardiac output/index = 5.9/2.4 PVR = 0.8 WU SVR = 1040 Ao sat = 97% PA sat = 66%, 67%   1. Normal coronary arteries 2. 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 Mildly elevated filling pressures with normal cardiac output   Past Medical History:  Diagnosis Date   Diabetes mellitus without complication ([facility])    Hypertension    Current Outpatient  Medications  Medication Sig Dispense Refill   Blood Glucose Monitoring Suppl (BLOOD GLUCOSE MONITOR SYSTEM) w/Device KIT Use to check blood sugars 4 times daily. 1 kit 0   digoxin (LANOXIN) 0.125 MG tablet Take 1 tablet (0.125 mg total) by mouth daily. 30 tablet 0   furosemide (LASIX) 40 MG tablet Take 1 tablet (40 mg total) by mouth daily. 30 tablet 0   insulin aspart protamine - aspart (NOVOLOG 70/30 MIX) (70-30) 100 UNIT/ML FlexPen Inject 15 Units into the skin 2 (two) times daily. (Patient taking differently: Inject 25 Units into the skin 2 (two) times daily with a meal.) 9 mL 0   sacubitril-valsartan (ENTRESTO) 49-51 MG Take 1 tablet by mouth 2 (two) times daily. 60 tablet 11   spironolactone (ALDACTONE) 25 MG tablet Take 1 tablet (25 mg total) by mouth daily. 30 tablet 0   No current facility-administered medications for this encounter.   No Known Allergies  Social History   Socioeconomic History   Marital status: Single    Spouse name: Not on file   Number of children: Not on file   Years of education: Not on file   Highest education level: Not on file  Occupational History   Not on file  Tobacco Use   Smoking status: Never   Smokeless tobacco: Never  Vaping Use   Vaping Use: Never used  Substance and Sexual Activity   Alcohol use: Never   Drug use: Never   Sexual activity: Not on file  Other Topics Concern   Not on file  Social History Narrative   Not on file   Social Determinants of Health   Financial Resource Strain: High Risk   Difficulty of Paying Living Expenses: Hard  Food Insecurity: No Food Insecurity   Worried About Running Out of Food in the Last Year: Never true   Ran Out of Food in the Last Year: Never true  Transportation Needs: No Transportation Needs   Lack of Transportation (Medical): No   Lack of Transportation (Non-Medical): No  Physical Activity: Not on file  Stress: Not on file  Social Connections: Not on file  Intimate Partner Violence:  Not on file   Family History  Problem Relation Age of Onset   Diabetes Mother    Hypertension Mother    BP 120/74   Pulse (!) 113   Wt 113.7 kg (250 lb 9.6 oz)   SpO2 97%   BMI 34.95 kg/m   Wt Readings from Last 3 Encounters:  01/11/22 113.7 kg (250 lb 9.6 oz)  12/13/21 113.8 kg (250 lb 12.8 oz)  12/01/21 117.9 kg (259 lb 14.4 oz)   PHYSICAL EXAM: General:  NAD. No resp difficulty, walked into clinic, chronically-ill appearing HEENT: Normal Neck: Supple. JVP 6-7. Carotids 2+ bilat; no bruits. No lymphadenopathy or thryomegaly appreciated. Cor: PMI nondisplaced. Regular rate & rhythm. No rubs, gallops or murmurs. Lungs: Clear, diminished in bases. Abdomen: Obese, soft, nontender, nondistended. No hepatosplenomegaly. No bruits or masses. Good bowel sounds. Extremities: No cyanosis, clubbing, rash, edema; venous stasis changes to RLE, s/p/ R TMA Neuro: Alert & oriented x 3, cranial nerves grossly intact. Moves all 4 extremities w/o difficulty. Tearful affect.  REDs: 36%  ASSESSMENT & PLAN: Chronic biventricular CHF: - New diagnosis this admit - NICM. Reports hx CHF in his mother. No hx alcohol or drug abuse. 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 Volume mildly up, REDs 36%. Needs to take meds regularly. - Increase Entresto to 97/103 mg bid. - Continue digoxin 0.125 mg daily. Dig level 0.4 on 12/13/21 - Continue Lasix 40 mg daily. - Continue spiro 25 mg daily - Add beta blocker next if volume stable. - No SGLT2i with uncontrolled DM and concern for hygiene - Discussed importance of low sodium diet and avoidance of fast food. Advised to monitor wt daily. - Labs today. Repeat BMET in 10  days. - Long talk about medication compliance.    2. Uncontrolled DM2: - A1c 12.4%. - On insulin. - Has not been compliant with medications   3. Hx osteomyelitis s/p right TMA: - Amputation stump has been slow to heal d/t poor diabetes control and hygiene - Follows with Podiatry at [place], needs to make an appt soon.   4. HTN: - Improved.  - GDMT per above.    5. Hypoalbuminemia - Liver US with echogenic parenchyma, ? Hepatic steatosis or fibrosis. Cannot exclude cirrhosis -? Secondary to RV failure   6. SDOH:  - Medicaid application in process. - Medication compliance an issue.  - Lives in a camper behind father's house. Unemployed and has limited social support. - Needs Paramedicine but he has refulsed, HFSW helping. Tearful over death of mother and wife in  same year. Declines referral for counseling.   Follow up with PharmD in 3-4 weeks (add beta blocker) and 3 months with Dr. Haroldine Laws + echo, as scheduled.  Allena Katz, FNP-BC 01/11/22

## 2022-01-11 ENCOUNTER — Ambulatory Visit (HOSPITAL_COMMUNITY)
Admission: RE | Admit: 2022-01-11 | Discharge: 2022-01-11 | Disposition: A | Discharge status: Home / Self Care | Payer: Medicaid Other | Source: Ambulatory Visit | Attending: Family Medicine | Admitting: Family Medicine

## 2022-01-11 ENCOUNTER — Encounter (HOSPITAL_COMMUNITY): Payer: Self-pay

## 2022-01-11 VITALS — BP 120/74 | HR 113 | Wt 250.6 lb

## 2022-01-11 DIAGNOSIS — Z794 Long term (current) use of insulin: Secondary | ICD-10-CM

## 2022-01-11 DIAGNOSIS — I428 Other cardiomyopathies: Secondary | ICD-10-CM | POA: Insufficient documentation

## 2022-01-11 DIAGNOSIS — Z79899 Other long term (current) drug therapy: Secondary | ICD-10-CM | POA: Insufficient documentation

## 2022-01-11 DIAGNOSIS — Z91199 Patient's noncompliance with other medical treatment and regimen due to unspecified reason: Secondary | ICD-10-CM | POA: Insufficient documentation

## 2022-01-11 DIAGNOSIS — I5022 Chronic systolic (congestive) heart failure: Secondary | ICD-10-CM

## 2022-01-11 DIAGNOSIS — Z139 Encounter for screening, unspecified: Secondary | ICD-10-CM

## 2022-01-11 DIAGNOSIS — E8809 Other disorders of plasma-protein metabolism, not elsewhere classified: Secondary | ICD-10-CM

## 2022-01-11 DIAGNOSIS — Z89431 Acquired absence of right foot: Secondary | ICD-10-CM | POA: Insufficient documentation

## 2022-01-11 DIAGNOSIS — I5082 Biventricular heart failure: Secondary | ICD-10-CM | POA: Insufficient documentation

## 2022-01-11 DIAGNOSIS — E1165 Type 2 diabetes mellitus with hyperglycemia: Secondary | ICD-10-CM | POA: Insufficient documentation

## 2022-01-11 DIAGNOSIS — I110 Hypertensive heart disease with heart failure: Secondary | ICD-10-CM | POA: Insufficient documentation

## 2022-01-11 DIAGNOSIS — I10 Essential (primary) hypertension: Secondary | ICD-10-CM

## 2022-01-11 DIAGNOSIS — Z91148 Patient's other noncompliance with medication regimen for other reason: Secondary | ICD-10-CM | POA: Insufficient documentation

## 2022-01-11 DIAGNOSIS — E1159 Type 2 diabetes mellitus with other circulatory complications: Secondary | ICD-10-CM

## 2022-01-11 DIAGNOSIS — Z8249 Family history of ischemic heart disease and other diseases of the circulatory system: Secondary | ICD-10-CM | POA: Insufficient documentation

## 2022-01-11 DIAGNOSIS — Z8739 Personal history of other diseases of the musculoskeletal system and connective tissue: Secondary | ICD-10-CM

## 2022-01-11 LAB — BASIC METABOLIC PANEL
Anion gap: 7 (ref 5–15)
BUN: 19 mg/dL (ref 6–20)
CO2: 29 mmol/L (ref 22–32)
Calcium: 9.1 mg/dL (ref 8.9–10.3)
Chloride: 99 mmol/L (ref 98–111)
Creatinine, Ser: 0.9 mg/dL (ref 0.61–1.24)
GFR, Estimated: >60 mL/min (ref >60)
Glucose, Bld: 215 mg/dL — ABNORMAL HIGH (ref 70–99)
Potassium: 4.1 mmol/L (ref 3.5–5.1)
Sodium: 135 mmol/L (ref 135–145)

## 2022-01-11 LAB — BRAIN NATRIURETIC PEPTIDE: B Natriuretic Peptide: 237.9 pg/mL — ABNORMAL HIGH (ref 0.0–100.0)

## 2022-01-11 MED ORDER — ENTRESTO 97-103 MG PO TABS: 1.0000 | ORAL_TABLET | Freq: Two times a day (BID) | ORAL | 11 refills | Status: DC

## 2022-01-11 NOTE — Patient Instructions (Addendum)
INCREASE Entresto to 97/103 mg one tab twice a day  Labs today We will only contact you if something comes back abnormal or we need to make some changes. Otherwise no news is good news!  Labs needed in 10-14 days  Please follow up with [facility] (Podiatry)- [facility] Phone: [phone]   Address: [address] [place], [place] [address]  Your physician recommends that you schedule a follow-up appointment in: 6 weeks with the pharmacy team and keep cardiology follow up with Dr Gala Romney  Do the following things EVERYDAY: Weigh yourself in the morning before breakfast. Write it down and keep it in a log. Take your medicines as prescribed Eat low salt foods--Limit salt (sodium) to 2000 mg per day.  Stay as active as you can everyday Limit all fluids for the day to less than 2 liters At the [facility], you and your health needs are our priority. As part of our continuing mission to provide you with exceptional heart care, we have created designated Provider Care Teams. These Care Teams include your primary Cardiologist (physician) and Advanced Practice Providers (APPs- Physician Assistants and Nurse Practitioners) who all work together to provide you with the care you need, when you need it.   You may see any of the following providers on your designated Care Team at your next follow up: Dr Arvilla Meres Dr Carron Curie, NP Robbie Lis, [place] [facility] [place], [place] Karle Plumber, PharmD   Please be sure to bring in all your medications bottles to every appointment.

## 2022-01-11 NOTE — Progress Notes (Signed)
ReDS Vest / Clip - 01/11/22 1400       ReDS Vest / Clip   Station Marker D    Ruler Value 34    ReDS Value Range Moderate volume overload    ReDS Actual Value 36

## 2022-01-11 NOTE — Progress Notes (Signed)
CSW spoke with pt about [facility] follow up- they had been trying to patient without success.  Pt not agreeable to participating at this time- reports he is very private person.  Physician discussed continuing concerns with pt not taking all his medications.  Pt reports he forgets sometimes.  Agreeable to having pt put alarms in his phone to remind him to take meds twice a day.  Pt admits to struggling somewhat with depression- attributes this to the loss of his mom 2 years ago- sounds like he doesn't feel like he has close relationships with other people.  Physician and CSW discussed feelings of depression with him but he does not want to pursue medication or treatment at this time.  Pt admits that he might not always take his mediations because he doesn't feel like caring for himself.  CSW discussed pt coping mechanisms and positives in his live (cooking, dog, dad, motorcycle).  He continues to express desire to live as long as he can and do the things he likes- CSW asked him to consider how his health affects his ability to enjoy these things and how badly he felt leading up to hospital stay.  Pt admits this was very distressing and understands medications are there to avoid this happening again- reports he will try to be better moving forward.  Burna Sis, LCSW Clinical Social Worker [facility] Desk#: [phone] Cell#: [phone]

## 2022-01-22 ENCOUNTER — Ambulatory Visit (HOSPITAL_COMMUNITY)
Admission: RE | Admit: 2022-01-22 | Discharge: 2022-01-22 | Disposition: A | Discharge status: Home / Self Care | Payer: Medicaid Other | Source: Ambulatory Visit | Attending: Internal Medicine | Admitting: Internal Medicine

## 2022-01-22 DIAGNOSIS — I5022 Chronic systolic (congestive) heart failure: Secondary | ICD-10-CM | POA: Insufficient documentation

## 2022-01-22 LAB — BASIC METABOLIC PANEL WITH GFR
Anion gap: 7 (ref 5–15)
BUN: 8 mg/dL (ref 6–20)
CO2: 31 mmol/L (ref 22–32)
Calcium: 8.7 mg/dL — ABNORMAL LOW (ref 8.9–10.3)
Chloride: 97 mmol/L — ABNORMAL LOW (ref 98–111)
Creatinine, Ser: 0.87 mg/dL (ref 0.61–1.24)
GFR, Estimated: >60 mL/min
Glucose, Bld: 389 mg/dL — ABNORMAL HIGH (ref 70–99)
Potassium: 4.7 mmol/L (ref 3.5–5.1)
Sodium: 135 mmol/L (ref 135–145)

## 2022-01-23 ENCOUNTER — Telehealth (HOSPITAL_COMMUNITY): Payer: Self-pay | Admitting: Surgery

## 2022-01-23 NOTE — Telephone Encounter (Signed)
-----   Message from Jacklynn Ganong, [place] sent at 01/22/2022  4:50 PM EDT ----- Glucose very high, needs to follow up with PCP.  Other labs ok

## 2022-01-23 NOTE — Telephone Encounter (Signed)
I called patient to review results and recommendations per provider.  He tells me that he has plans to follow-up with his Primary care doctor about his blood sugar.  He also had concerns that he had not received a message that his new prescriptions were filled and does not have his medications.  I can see that all prescriptions were sent to [facility] in [place].  I asked that he call to [facility] pharmacy and provided the phone number and pick up medications as soon as possible.

## 2022-02-08 ENCOUNTER — Other Ambulatory Visit (HOSPITAL_COMMUNITY): Payer: Self-pay

## 2022-02-08 NOTE — Progress Notes (Signed)
Advanced Heart Failure Clinic Note   Primary Care: Patient, No Pcp Per (Inactive) HF Cardiologist: Dr. Gala Romney  HPI:  Dakota Ramirez is a 51 y.o. 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 No prior cardiac history.    Admitted 11/2021 with new acute HF.  Given IV Lasix. Echo showed  EF 20-25%, regional WMA, moderately dilated LV, RV moderately reduced, RVSP 40 mmHg, mild to moderate MR, dilated IVC with estimated RAP 15 mmHg. Cards consulted and GDMT started. 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  Overall diuresed 50 lbs. No SGLT2i with uncontrolled DM. Discharged home, weight 259 lbs.    Post hospital follow up, NYHA II, volume OK. Entresto was increased and paramedicine arranged to help with med compliance (later declined parmedicine).   Returned to [facility] for HF follow up 01/11/22. Overall was feeling fine, had some fatigue. No SOB with walking, not very active due to TMA. Denied CP, dizziness, edema, or PND/Orthopnea. Appetite was ok. No fever or chills. Not weighing at home, not taking medications regularly (~50% compliance). Unemployed, lives in a camper behind father's house with his dog, [name]. No drug/ETOH/tobacco use.   Today he returns to HF clinic for pharmacist medication titration. At last visit with APP, Entresto was increased to 97/103 mg BID. Of note, patient has been out of all heart failure medication since last visit as refills were not sent to [facility]. Patient is uninsured and patient assistance paperwork has not been started for [facility], so patient was unable to pick up Entresto 97/103 mg due to cost. Overall, he is feeling tired but otherwise ok. Appetite ok. Reports occasional dizziness upon standing that resolves after a few moments. Notes SOB/DOE when walking in the heat. Weight has been stable at 250 lbs. No LEE. Sleeps on 3  pillows but down to 1 pillow by the end of the night. He reports he does not have a scale nor BP machine at home.  BP in clinic today was 156/84. He was provided a scale today.   HF Medications: Entresto 97/103 mg BID - patient not taking Spironolactone 25 mg daily - patient not taking Digoxin 0.125 mg daily - patient not taking Lasix 40 mg daily - patient not taking  Has the patient been experiencing any side effects to the medications prescribed?  No  Does the patient have any problems obtaining medications due to transportation or finances?   YES, uninsured and did not have any refills on heart failure medications. Started patient assistance paperwork today for [employer]. Patient is aware he will have to request "No Income Recruitment consultant from [employer] before Entresto PAP will be approved. Provided Entresto samples in clinic today. All other heart failure medication refills were sent to [facility].   Understanding of regimen: fair Understanding of indications: fair Potential of compliance: fair Patient understands to avoid NSAIDs. Patient understands to avoid decongestants.    Pertinent Lab Values: 01/22/22: Serum creatinine 0.87, BUN 8, Potassium 4.7, Sodium 135, BNP 237.9 (01/11/22), Digoxin 0.4 (12/13/21)   Vital Signs: Weight: 250.4 lbs (last clinic weight: 250 lbs) Blood pressure: 156/84  Heart rate: 108    Assessment/Plan: Chronic biventricular CHF: - NICM. Reports hx CHF in his mother. No hx alcohol or drug abuse. - Echo (11/2021): EF 20-25%, regional WMA, moderately dilated LV, RV moderately reduced, RVSP 40 mmHg, mild to moderate MR, dilated IVC with estimated  RAP 15 mmHg - R/LHC (11/2021): showed normal cors, severe NICM EF 20-25% and mildly elevated filling pressures w/ normal CO - NYHA II, limited mostly by fatigue. Euvolemic on exam.  - Change Lasix to 40 mg daily PRN. - Restart Entresto 24/26 mg BID as patient never started higher doses of Entresto due to cost. Repeat  BMET at next visit. [employer] PAP pending.  - Restart digoxin 0.125 mg daily. Digoxin level 0.4 on 12/13/21. Obtain digoxin level at next visit.  - Restart spironolactone 25 mg daily. - No SGLT2i with uncontrolled DM and concern for hygiene. - No insurance, so no cMRI - Discussed importance of low sodium diet and avoidance of fast food. Advised to monitor weight daily with scale provided today.   2. Uncontrolled DM2: - A1c 12.4%. - On insulin. - Has not been compliant with medications due to cost   3. Hx osteomyelitis s/p right TMA: - Amputation stump has been slow to heal d/t poor diabetes control and hygiene - Follows with Podiatry at [place], needs to make an appt soon.   4. HTN: - Elevated but not taking his medications.  - Restart Entresto and spironolactone as above.   5. Hypoalbuminemia - Liver US with echogenic parenchyma, ? Hepatic steatosis or fibrosis. Cannot exclude cirrhosis -? Secondary to RV failure   6. SDOH:  - Medication compliance an issue.  - Lives in a camper behind father's house. Unemployed and has limited social support. - Needs Paramedicine but he has refused, HFSW helping. Tearful over death of mother and wife in same year. Declines referral for counseling.  Follow up 03/13/22 at pharmacy clinic.    Karle Plumber, PharmD, BCPS, BCCP, CPP Heart Failure Clinic Pharmacist [phone]

## 2022-02-22 ENCOUNTER — Ambulatory Visit (HOSPITAL_COMMUNITY)
Admission: RE | Admit: 2022-02-22 | Discharge: 2022-02-22 | Disposition: A | Discharge status: Home / Self Care | Payer: Medicaid Other | Source: Ambulatory Visit | Attending: Cardiology | Admitting: Cardiology

## 2022-02-22 VITALS — BP 156/84 | HR 108 | Wt 250.4 lb

## 2022-02-22 DIAGNOSIS — E8809 Other disorders of plasma-protein metabolism, not elsewhere classified: Secondary | ICD-10-CM | POA: Insufficient documentation

## 2022-02-22 DIAGNOSIS — E1165 Type 2 diabetes mellitus with hyperglycemia: Secondary | ICD-10-CM | POA: Insufficient documentation

## 2022-02-22 DIAGNOSIS — M869 Osteomyelitis, unspecified: Secondary | ICD-10-CM | POA: Insufficient documentation

## 2022-02-22 DIAGNOSIS — I428 Other cardiomyopathies: Secondary | ICD-10-CM | POA: Insufficient documentation

## 2022-02-22 DIAGNOSIS — I5022 Chronic systolic (congestive) heart failure: Secondary | ICD-10-CM

## 2022-02-22 DIAGNOSIS — I110 Hypertensive heart disease with heart failure: Secondary | ICD-10-CM | POA: Insufficient documentation

## 2022-02-22 DIAGNOSIS — I5082 Biventricular heart failure: Secondary | ICD-10-CM | POA: Insufficient documentation

## 2022-02-22 DIAGNOSIS — Z794 Long term (current) use of insulin: Secondary | ICD-10-CM | POA: Insufficient documentation

## 2022-02-22 MED ORDER — SPIRONOLACTONE 25 MG PO TABS: 25.0000 mg | ORAL_TABLET | Freq: Every day | ORAL | 3 refills | Status: DC

## 2022-02-22 MED ORDER — ENTRESTO 24-26 MG PO TABS: 1.0000 | ORAL_TABLET | Freq: Two times a day (BID) | ORAL | 3 refills | Status: DC

## 2022-02-22 MED ORDER — FUROSEMIDE 40 MG PO TABS: 40.0000 mg | ORAL_TABLET | Freq: Every day | ORAL | 3 refills | Status: DC | PRN

## 2022-02-22 MED ORDER — DIGOXIN 125 MCG PO TABS: 0.1250 mg | ORAL_TABLET | Freq: Every day | ORAL | 3 refills | Status: DC

## 2022-02-22 NOTE — Patient Instructions (Addendum)
It was a pleasure seeing you today!  MEDICATIONS: -We are changing your medications today -Start taking furosemide (Lasix) 40 mg as needed (weight gain of 3 pound in one day or 5 pounds in one week) -Restart Entresto at 24-26 mg (1 tab) twice daily  -Restart spironolactone and digoxin -Call if you have questions about your medications.  LABS: -We will call you if your labs need attention.  NEXT APPOINTMENT: Return to clinic on 03/13/22 with pharmacy clinic.  In general, to take care of your heart failure: -Limit your fluid intake to 2 Liters (half-gallon) per day.   -Limit your salt intake to ideally 2-3 grams (2000-3000 mg) per day. -Weigh yourself daily and record, and bring that "weight diary" to your next appointment.  (Weight gain of 2-3 pounds in 1 day typically means fluid weight.) -The medications for your heart are to help your heart and help you live longer.   -Please contact us before stopping any of your heart medications.  Call the clinic at [phone] with questions or to reschedule future appointments.

## 2022-02-23 ENCOUNTER — Telehealth (HOSPITAL_COMMUNITY): Payer: Self-pay | Admitting: Pharmacist

## 2022-02-23 ENCOUNTER — Other Ambulatory Visit (HOSPITAL_COMMUNITY): Payer: Self-pay | Admitting: *Deleted

## 2022-02-23 MED ORDER — ENTRESTO 24-26 MG PO TABS: 1.0000 | ORAL_TABLET | Freq: Two times a day (BID) | ORAL | 3 refills | Status: DC

## 2022-02-23 NOTE — Telephone Encounter (Signed)
Sent in [employer] application to [employer] for [employer]. Patient is aware no income attestation letter will have to completed before application will be approved. He was instructed to call [employer] to expedite them sending the no income attestation letter .    Application pending, will continue to follow.   Karle Plumber, PharmD, BCPS, BCCP, CPP Heart Failure Clinic Pharmacist [phone]

## 2022-02-28 NOTE — Telephone Encounter (Signed)
Advanced Heart Failure Patient Advocate [employer] regarding patient assistance application. They called and left the patient a message regarding POI on 07/17. I called and left the patient a detailed message on how to call [employer] to get income attestation letter. Application will not be approved until patient provides documentation.   Document scanned to chart.   Archer Asa, CPhT

## 2022-03-13 ENCOUNTER — Ambulatory Visit (HOSPITAL_COMMUNITY)
Admission: RE | Admit: 2022-03-13 | Discharge: 2022-03-13 | Disposition: A | Discharge status: Home / Self Care | Payer: MEDICAID | Source: Ambulatory Visit | Attending: Cardiology | Admitting: Cardiology

## 2022-03-13 ENCOUNTER — Encounter (HOSPITAL_COMMUNITY): Payer: Self-pay

## 2022-03-13 ENCOUNTER — Telehealth (HOSPITAL_COMMUNITY): Payer: Self-pay | Admitting: Pharmacist

## 2022-03-13 VITALS — BP 148/72 | HR 115 | Wt 242.0 lb

## 2022-03-13 DIAGNOSIS — M869 Osteomyelitis, unspecified: Secondary | ICD-10-CM | POA: Insufficient documentation

## 2022-03-13 DIAGNOSIS — E8809 Other disorders of plasma-protein metabolism, not elsewhere classified: Secondary | ICD-10-CM | POA: Insufficient documentation

## 2022-03-13 DIAGNOSIS — I110 Hypertensive heart disease with heart failure: Secondary | ICD-10-CM | POA: Insufficient documentation

## 2022-03-13 DIAGNOSIS — I5082 Biventricular heart failure: Secondary | ICD-10-CM | POA: Insufficient documentation

## 2022-03-13 DIAGNOSIS — I5021 Acute systolic (congestive) heart failure: Secondary | ICD-10-CM

## 2022-03-13 DIAGNOSIS — Z794 Long term (current) use of insulin: Secondary | ICD-10-CM | POA: Insufficient documentation

## 2022-03-13 DIAGNOSIS — I5022 Chronic systolic (congestive) heart failure: Secondary | ICD-10-CM

## 2022-03-13 DIAGNOSIS — E1165 Type 2 diabetes mellitus with hyperglycemia: Secondary | ICD-10-CM | POA: Insufficient documentation

## 2022-03-13 LAB — BASIC METABOLIC PANEL
Anion gap: 10 (ref 5–15)
BUN: 20 mg/dL (ref 6–20)
CO2: 28 mmol/L (ref 22–32)
Calcium: 9.2 mg/dL (ref 8.9–10.3)
Chloride: 99 mmol/L (ref 98–111)
Creatinine, Ser: 0.97 mg/dL (ref 0.61–1.24)
GFR, Estimated: >60 mL/min (ref >60)
Glucose, Bld: 179 mg/dL — ABNORMAL HIGH (ref 70–99)
Potassium: 3.5 mmol/L (ref 3.5–5.1)
Sodium: 137 mmol/L (ref 135–145)

## 2022-03-13 MED ORDER — CARVEDILOL 3.125 MG PO TABS: 3.1250 mg | ORAL_TABLET | Freq: Two times a day (BID) | ORAL | 5 refills | Status: DC
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
## 2022-03-13 NOTE — Telephone Encounter (Signed)
Medication Samples have been provided to the patient.   Drug name: Sherryll Burger     Strength: 49/51 mg      Qty: 2 bottles                 LOT: [id number] Exp.Date: 05/2023; 03/2024   Dosing instructions: 1 tablet BID.   The patient has been instructed regarding the correct time, dose, and frequency of taking this medication, including desired effects and most common side effects.   Karle Plumber, PharmD, BCPS, CPP Heart Failure Clinic Pharmacist [phone]

## 2022-03-13 NOTE — Patient Instructions (Signed)
It was a pleasure seeing you today!  MEDICATIONS: -We are changing your medications today -Start carvedilol 3.125 mg twice daily  -Call if you have questions about your medications.  LABS: -We will call you if your labs need attention.  NEXT APPOINTMENT: Return to clinic on with 1 month with Dr. Shirlee Latch.  In general, to take care of your heart failure: -Limit your fluid intake to 2 Liters (half-gallon) per day.   -Limit your salt intake to ideally 2-3 grams (2000-3000 mg) per day. -Weigh yourself daily and record, and bring that "weight diary" to your next appointment.  (Weight gain of 2-3 pounds in 1 day typically means fluid weight.) -The medications for your heart are to help your heart and help you live longer.   -Please contact us before stopping any of your heart medications.  Call the clinic at [phone] with questions or to reschedule future appointments.

## 2022-04-20 ENCOUNTER — Ambulatory Visit (HOSPITAL_COMMUNITY)
Admission: RE | Admit: 2022-04-20 | Discharge: 2022-04-20 | Disposition: A | Discharge status: Home / Self Care | Payer: Self-pay | Source: Ambulatory Visit | Attending: Internal Medicine | Admitting: Internal Medicine

## 2022-04-20 ENCOUNTER — Encounter (HOSPITAL_COMMUNITY): Payer: Self-pay | Admitting: Internal Medicine

## 2022-04-20 VITALS — BP 142/88 | HR 111 | Wt 255.6 lb

## 2022-04-20 DIAGNOSIS — R0602 Shortness of breath: Secondary | ICD-10-CM | POA: Insufficient documentation

## 2022-04-20 DIAGNOSIS — R0609 Other forms of dyspnea: Secondary | ICD-10-CM | POA: Insufficient documentation

## 2022-04-20 DIAGNOSIS — Z79899 Other long term (current) drug therapy: Secondary | ICD-10-CM | POA: Insufficient documentation

## 2022-04-20 DIAGNOSIS — Z91148 Patient's other noncompliance with medication regimen for other reason: Secondary | ICD-10-CM | POA: Insufficient documentation

## 2022-04-20 DIAGNOSIS — I5022 Chronic systolic (congestive) heart failure: Secondary | ICD-10-CM

## 2022-04-20 DIAGNOSIS — E8809 Other disorders of plasma-protein metabolism, not elsewhere classified: Secondary | ICD-10-CM | POA: Insufficient documentation

## 2022-04-20 DIAGNOSIS — Z794 Long term (current) use of insulin: Secondary | ICD-10-CM | POA: Insufficient documentation

## 2022-04-20 DIAGNOSIS — I5082 Biventricular heart failure: Secondary | ICD-10-CM | POA: Insufficient documentation

## 2022-04-20 DIAGNOSIS — E1169 Type 2 diabetes mellitus with other specified complication: Secondary | ICD-10-CM | POA: Insufficient documentation

## 2022-04-20 DIAGNOSIS — I10 Essential (primary) hypertension: Secondary | ICD-10-CM

## 2022-04-20 DIAGNOSIS — I110 Hypertensive heart disease with heart failure: Secondary | ICD-10-CM | POA: Insufficient documentation

## 2022-04-20 DIAGNOSIS — I428 Other cardiomyopathies: Secondary | ICD-10-CM | POA: Insufficient documentation

## 2022-04-20 DIAGNOSIS — E1159 Type 2 diabetes mellitus with other circulatory complications: Secondary | ICD-10-CM

## 2022-04-20 DIAGNOSIS — Z8739 Personal history of other diseases of the musculoskeletal system and connective tissue: Secondary | ICD-10-CM

## 2022-04-20 DIAGNOSIS — E1165 Type 2 diabetes mellitus with hyperglycemia: Secondary | ICD-10-CM | POA: Insufficient documentation

## 2022-04-20 DIAGNOSIS — Z8249 Family history of ischemic heart disease and other diseases of the circulatory system: Secondary | ICD-10-CM | POA: Insufficient documentation

## 2022-04-20 LAB — BASIC METABOLIC PANEL
Anion gap: 10 (ref 5–15)
BUN: 14 mg/dL (ref 6–20)
CO2: 28 mmol/L (ref 22–32)
Calcium: 8.8 mg/dL — ABNORMAL LOW (ref 8.9–10.3)
Chloride: 100 mmol/L (ref 98–111)
Creatinine, Ser: 0.98 mg/dL (ref 0.61–1.24)
GFR, Estimated: >60 mL/min (ref >60)
Glucose, Bld: 117 mg/dL — ABNORMAL HIGH (ref 70–99)
Potassium: 3.9 mmol/L (ref 3.5–5.1)
Sodium: 138 mmol/L (ref 135–145)

## 2022-04-20 LAB — BRAIN NATRIURETIC PEPTIDE: B Natriuretic Peptide: 244.2 pg/mL — ABNORMAL HIGH (ref 0.0–100.0)

## 2022-04-20 MED ORDER — CARVEDILOL 6.25 MG PO TABS: 6.2500 mg | ORAL_TABLET | Freq: Two times a day (BID) | ORAL | 3 refills | Status: DC

## 2022-04-20 MED ORDER — ENTRESTO 49-51 MG PO TABS: 1.0000 | ORAL_TABLET | Freq: Two times a day (BID) | ORAL | 3 refills | Status: DC

## 2022-04-20 NOTE — Patient Instructions (Signed)
Medication Changes:  Increase Entresto to 49/51 mg Twice daily   Increase Carvedilol to 6.25 mg Twice daily   Lab Work:  Labs done today, we will call you for abnormal results  Testing/Procedures:  none  Referrals:  none  Special Instructions // Education:  Do the following things EVERYDAY: Weigh yourself in the morning before breakfast. Write it down and keep it in a log. Take your medicines as prescribed Eat low salt foods--Limit salt (sodium) to 2000 mg per day.  Stay as active as you can everyday Limit all fluids for the day to less than 2 liters   Follow-Up in: 3 months  At the Advanced Heart Failure Clinic, you and your health needs are our priority. We have a designated team specialized in the treatment of Heart Failure. This Care Team includes your primary Heart Failure Specialized Cardiologist (physician), Advanced Practice Providers (APPs- Physician Assistants and Nurse Practitioners), and Pharmacist who all work together to provide you with the care you need, when you need it.   You may see any of the following providers on your designated Care Team at your next follow up:  Dr. Arvilla Meres Dr. Marca Ancona Dr. Marcos Eke, NP Robbie Lis, [place] [facility] [place], [place] Brynda Peon, NP Karle Plumber, PharmD   Please be sure to bring in all your medications bottles to every appointment.   Need to Contact us:  If you have any questions or concerns before your next appointment please send [place] a message through [place] or call our office at [phone].    TO LEAVE A MESSAGE FOR THE NURSE SELECT OPTION 2, PLEASE LEAVE A MESSAGE INCLUDING: YOUR NAME DATE OF BIRTH CALL BACK NUMBER REASON FOR CALL**this is important as we prioritize the call backs  YOU WILL RECEIVE A CALL BACK THE SAME DAY AS LONG AS YOU CALL BEFORE 4:00 PM

## 2022-04-20 NOTE — Progress Notes (Addendum)
ADVANCED HF CLINIC NOTE   Primary Care: Patient, No Pcp Per HF Cardiologist: Dr. Haroldine Laws  HPI: Dakota Ramirez is a 51 y.o. 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 No prior cardiac history.    Admitted 4/23 with new-onset acute HF.  Given IV lasix. Echo EF 20-25%, regional WMA, moderately dilated LV, RV moderately reduced, RVSP 40 mmHg, mild to moderate MR, dilated IVC with estimated RAP 15 mmHg. Cards consulted and GDMT started. 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  Overall diuresed 50 lbs. No SGLT2i with uncontrolled DM. Discharged home, weight 259 lbs.   Post hospital follow up, NYHA II, volume OK. Entresto increased and paramedicine arranged to help with med compliance.  Today he returns for HF follow up. Missed his echo appt. Feeling ok, going through a lot emotionally. Tearful when talking about his mom. Has some fatigue. Increased SOB with walking, not very active due to TMA/foot wounds. Denies CP, dizziness, edema, or PND/Orthopnea. Appetite ok. No fever or chills. Not weighing at home, not taking medications regularly (~50% compliance). Unemployed, lives in a camper behind father's house with his dog, [name]. Refused paramedicine again and offered pill boxes to help with compliance but refused. No drug/ETOH/tobacco use.  Cardiac Studies: - Echo (4/23): EF 20-25%, regional WMA, moderately dilated LV, RV moderately reduced, RVSP 40 mmHg, mild to moderate MR, dilated IVC with estimated RAP 15 mmHg.  - R/LHC (4/23):    The left ventricular ejection fraction is less than 25% by visual estimate.  Ao = 97/67 (85) LV = 105/25 RA = 8 RV = 37/14 PA = 35/22 (25) PCW = 20 Fick cardiac output/index = 5.9/2.4 PVR = 0.8 WU SVR = 1040 Ao sat = 97% PA sat = 66%, 67%   1. Normal coronary arteries 2. 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 Mildly elevated  filling pressures with normal cardiac output   Past Medical History:  Diagnosis Date   Diabetes mellitus without complication (HCC)    Hypertension    Current Outpatient Medications  Medication Sig Dispense Refill   Blood Glucose Monitoring Suppl (BLOOD GLUCOSE MONITOR SYSTEM) w/Device KIT Use to check blood sugars 4 times daily. 1 kit 0   carvedilol (COREG) 3.125 MG tablet Take 1 tablet (3.125 mg total) by mouth 2 (two) times daily. 60 tablet 5   digoxin (LANOXIN) 0.125 MG tablet Take 1 tablet (0.125 mg total) by mouth daily. 30 tablet 3   furosemide (LASIX) 40 MG tablet Take 1 tablet (40 mg total) by mouth daily as needed for fluid. 30 tablet 3   insulin aspart protamine - aspart (NOVOLOG 70/30 MIX) (70-30) 100 UNIT/ML FlexPen Inject 15 Units into the skin 2 (two) times daily. (Patient taking differently: Inject 25 Units into the skin 2 (two) times daily with a meal.) 9 mL 0   sacubitril-valsartan (ENTRESTO) 24-26 MG Take 1 tablet by mouth 2 (two) times daily. 180 tablet 3   spironolactone (ALDACTONE) 25 MG tablet Take 1 tablet (25 mg total) by mouth daily. 30 tablet 3   No current facility-administered medications for this encounter.   No Known Allergies  Social History   Socioeconomic History   Marital status: Single    Spouse name: Not on file   Number of children: Not on file   Years of education: Not on file   Highest education level: Not on file  Occupational History  Not on file  Tobacco Use   Smoking status: Never   Smokeless tobacco: Never  Vaping Use   Vaping Use: Never used  Substance and Sexual Activity   Alcohol use: Never   Drug use: Never   Sexual activity: Not on file  Other Topics Concern   Not on file  Social History Narrative   Not on file   Social Determinants of Health   Financial Resource Strain: High Risk (11/29/2021)   Overall Financial Resource Strain (CARDIA)    Difficulty of Paying Living Expenses: Hard  Food Insecurity: No Food  Insecurity (11/29/2021)   Hunger Vital Sign    Worried About Running Out of Food in the Last Year: Never true    Ran Out of Food in the Last Year: Never true  Transportation Needs: No Transportation Needs (11/29/2021)   PRAPARE - Hydrologist (Medical): No    Lack of Transportation (Non-Medical): No  Physical Activity: Not on file  Stress: Not on file  Social Connections: Not on file  Intimate Partner Violence: Not on file   Family History  Problem Relation Age of Onset   Diabetes Mother    Hypertension Mother    There were no vitals taken for this visit.  Wt Readings from Last 3 Encounters:  03/13/22 109.8 kg (242 lb)  02/22/22 113.6 kg (250 lb 6.4 oz)  01/11/22 113.7 kg (250 lb 9.6 oz)   PHYSICAL EXAM: General:  disheveled appearing. No respiratory difficulty HEENT: normal Neck: supple. JVD ~8 cm. Carotids 2+ bilat; no bruits. No lymphadenopathy or thyromegaly appreciated. Cor: PMI nondisplaced. Regular rate & rhythm. No rubs, gallops or murmurs. Lungs: clear Abdomen: soft, nontender, nondistended. No hepatosplenomegaly. No bruits or masses. Good bowel sounds. Extremities: no cyanosis, clubbing, rash, non-pitting edema BLE. Venous stasis changes to RLE, s/p/ R TMA. Dressings to both feet. Neuro: alert & oriented x 3, cranial nerves grossly intact. moves all 4 extremities w/o difficulty. Affect pleasant.   ASSESSMENT & PLAN: Chronic biventricular CHF: - NICM. Reports hx CHF in his mother. No hx alcohol or drug abuse. 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 Volume looks stable, last took meds today. Needs to take meds regularly. - Increase  Entresto to 49/51 mg bid. - Continue digoxin 0.125 mg daily. Dig level 0.4 on 12/13/21 - Continue Lasix 40 mg daily. - Continue spiro 25 mg daily - Increase carvedilol to 6.41m  - No SGLT2i with uncontrolled DM and concern for hygiene - Discussed importance of low sodium diet and avoidance of fast food. Advised to monitor wt daily. - BMET, BNP - Long talk about medication compliance.    2. Uncontrolled DM2: - A1c 12.4%. - On insulin. - Has not been compliant with medications   3. Hx osteomyelitis s/p right TMA: - Amputation stump has been slow to heal d/t poor diabetes control and hygiene - [place]s with [place]iatry at [place] last saw yesterday - Redressed L foot while in clinic   4. HTN: - High during visit, due to noncompliance - GDMT per above.    5. Hypoalbuminemia - Liver [place]ith echogenic parenchyma, ? Hepatic steatosis or fibrosis. Cannot exclude cirrhosis -?  Secondary to RV failure   6. SDOH:  - Medicaid application in process. - Medication compliance an issue.  - Lives in a camper behind father's house. Unemployed and has limited social support. - Needs Paramedicine but he has refulsed, HFSW helping. Tearful over death of mother and wife in same year. Declines referral for counseling.   Earnie Larsson, AGACNP-BC  1:36 PM  Patient seen and examined with the above-signed Advanced Practice Provider and/or Housestaff. I personally reviewed laboratory data, imaging studies and relevant notes. I independently examined the patient and formulated the important aspects of the plan. I have edited the note to reflect any of my changes or salient points. I have personally discussed the plan with the patient and/or family.  Overall feeling better. Denies CP,edema, orthopnea or PND. Mild DOE. Limited by fatigue and foot wounds. Tearful about death of his mother, Refuses Paramedicine.   Taking meds.   I performed bedside echo today EF 20-25%   General:  Tearful No resp  difficulty HEENT: normal Neck: supple. no JVD. Carotids 2+ bilat; no bruits. No lymphadenopathy or thryomegaly appreciated. Cor: PMI nondisplaced. Regular rate & rhythm. No rubs, gallops or murmurs. Lungs: clear but decreased throughout Abdomen: obese soft, nontender, nondistended. No hepatosplenomegaly. No bruits or masses. Good bowel sounds. Extremities: no cyanosis, clubbing, rash, edema s/p R TMA. L foot wounds ok  Neuro: alert & orientedx3, cranial nerves grossly intact. moves all 4 extremities w/o difficulty. Affect pleasant but tearful   Remains tenuous. Major SDOH needs but refuses Paramedicine SW to see. Titrate Entresto,. Check labs. Not candidate for ICD with ongoing LE wounds.   Glori Bickers, MD  11:05 PM

## 2022-06-21 ENCOUNTER — Other Ambulatory Visit (HOSPITAL_COMMUNITY): Payer: Self-pay | Admitting: *Deleted

## 2022-06-21 DIAGNOSIS — R809 Proteinuria, unspecified: Secondary | ICD-10-CM

## 2022-06-21 DIAGNOSIS — N390 Urinary tract infection, site not specified: Secondary | ICD-10-CM

## 2022-06-27 ENCOUNTER — Ambulatory Visit (HOSPITAL_COMMUNITY): Payer: Self-pay

## 2022-06-27 ENCOUNTER — Encounter (HOSPITAL_COMMUNITY): Payer: Self-pay

## 2022-06-28 ENCOUNTER — Other Ambulatory Visit (HOSPITAL_COMMUNITY): Payer: Self-pay | Admitting: Internal Medicine

## 2022-07-16 NOTE — Progress Notes (Incomplete)
ADVANCED HF CLINIC NOTE   Primary Care: Patient, No Pcp Per HF Cardiologist: Dr. Haroldine Laws  HPI: Dakota Ramirez is a 51 y.o. 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 No prior cardiac history.    Admitted 4/23 with new-onset acute HF.  Given IV lasix. Echo EF 20-25%, regional WMA, moderately dilated LV, RV moderately reduced, RVSP 40 mmHg, mild to moderate MR, dilated IVC with estimated RAP 15 mmHg. Cards consulted and GDMT started. 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  Overall diuresed 50 lbs. No SGLT2i with uncontrolled DM. Discharged home, weight 259 lbs.   Post hospital follow up, NYHA II, volume OK. Entresto increased and paramedicine arranged to help with med compliance.  Today he returns for HF follow up. Missed his echo appt. Feeling ok, going through a lot emotionally. Tearful when talking about his mom. Has some fatigue. Increased SOB with walking, not very active due to TMA/foot wounds. Denies CP, dizziness, edema, or PND/Orthopnea. Appetite ok. No fever or chills. Not weighing at home, not taking medications regularly (~50% compliance). Unemployed, lives in a camper behind father's house with his dog, [name]. Refused paramedicine again and offered pill boxes to help with compliance but refused. No drug/ETOH/tobacco use.  Cardiac Studies: - Echo (4/23): EF 20-25%, regional WMA, moderately dilated LV, RV moderately reduced, RVSP 40 mmHg, mild to moderate MR, dilated IVC with estimated RAP 15 mmHg.  - R/LHC (4/23):    The left ventricular ejection fraction is less than 25% by visual estimate.  Ao = 97/67 (85) LV = 105/25 RA = 8 RV = 37/14 PA = 35/22 (25) PCW = 20 Fick cardiac output/index = 5.9/2.4 PVR = 0.8 WU SVR = 1040 Ao sat = 97% PA sat = 66%, 67%   1. Normal coronary arteries 2. 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 Mildly elevated  filling pressures with normal cardiac output   Past Medical History:  Diagnosis Date   Diabetes mellitus without complication (HCC)    Hypertension    Current Outpatient Medications  Medication Sig Dispense Refill   Blood Glucose Monitoring Suppl (BLOOD GLUCOSE MONITOR SYSTEM) w/Device KIT Use to check blood sugars 4 times daily. 1 kit 0   carvedilol (COREG) 6.25 MG tablet Take 1 tablet (6.25 mg total) by mouth 2 (two) times daily. 60 tablet 3   digoxin (LANOXIN) 0.125 MG tablet Take 1 tablet (0.125 mg total) by mouth daily. 30 tablet 3   furosemide (LASIX) 40 MG tablet Take 1 tablet (40 mg total) by mouth daily as needed for fluid. 30 tablet 3   insulin aspart protamine - aspart (NOVOLOG 70/30 MIX) (70-30) 100 UNIT/ML FlexPen Inject 15 Units into the skin 2 (two) times daily. (Patient taking differently: Inject 25 Units into the skin 2 (two) times daily with a meal.) 9 mL 0   sacubitril-valsartan (ENTRESTO) 49-51 MG Take 1 tablet by mouth 2 (two) times daily. 60 tablet 3   spironolactone (ALDACTONE) 25 MG tablet Take 1 tablet (25 mg total) by mouth daily. 30 tablet 3   No current facility-administered medications for this visit.   No Known Allergies  Social History   Socioeconomic History   Marital status: Single    Spouse name: Not on file   Number of children: Not on file   Years of education: Not on file   Highest education level: Not on file  Occupational History  Not on file  Tobacco Use   Smoking status: Never   Smokeless tobacco: Never  Vaping Use   Vaping Use: Never used  Substance and Sexual Activity   Alcohol use: Never   Drug use: Never   Sexual activity: Not on file  Other Topics Concern   Not on file  Social History Narrative   Not on file   Social Determinants of Health   Financial Resource Strain: High Risk (11/29/2021)   Overall Financial Resource Strain (CARDIA)    Difficulty of Paying Living Expenses: Hard  Food Insecurity: No Food Insecurity  (11/29/2021)   Hunger Vital Sign    Worried About Running Out of Food in the Last Year: Never true    Ran Out of Food in the Last Year: Never true  Transportation Needs: No Transportation Needs (11/29/2021)   PRAPARE - Hydrologist (Medical): No    Lack of Transportation (Non-Medical): No  Physical Activity: Not on file  Stress: Not on file  Social Connections: Not on file  Intimate Partner Violence: Not on file   Family History  Problem Relation Age of Onset   Diabetes Mother    Hypertension Mother    There were no vitals taken for this visit.  Wt Readings from Last 3 Encounters:  04/20/22 115.9 kg (255 lb 9.6 oz)  03/13/22 109.8 kg (242 lb)  02/22/22 113.6 kg (250 lb 6.4 oz)   PHYSICAL EXAM: General:  disheveled appearing. No respiratory difficulty HEENT: normal Neck: supple. JVD ~8 cm. Carotids 2+ bilat; no bruits. No lymphadenopathy or thyromegaly appreciated. Cor: PMI nondisplaced. Regular rate & rhythm. No rubs, gallops or murmurs. Lungs: clear Abdomen: soft, nontender, nondistended. No hepatosplenomegaly. No bruits or masses. Good bowel sounds. Extremities: no cyanosis, clubbing, rash, non-pitting edema BLE. Venous stasis changes to RLE, s/p/ R TMA. Dressings to both feet. Neuro: alert & oriented x 3, cranial nerves grossly intact. moves all 4 extremities w/o difficulty. Affect pleasant.   ASSESSMENT & PLAN: Chronic biventricular CHF: - NICM. Reports hx CHF in his mother. No hx alcohol or drug abuse. 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 Volume looks stable, last took meds today. Needs to take meds regularly. - Increase Entresto to  49/51 mg bid. - Continue digoxin 0.125 mg daily. Dig level 0.4 on 12/13/21 - Continue Lasix 40 mg daily. - Continue spiro 25 mg daily - Increase carvedilol to 6.12m  - No SGLT2i with uncontrolled DM and concern for hygiene - Discussed importance of low sodium diet and avoidance of fast food. Advised to monitor wt daily. - BMET, BNP - Long talk about medication compliance.    2. Uncontrolled DM2: - A1c 12.4%. - On insulin. - Has not been compliant with medications   3. Hx osteomyelitis s/p right TMA: - Amputation stump has been slow to heal d/t poor diabetes control and hygiene - [place] - Redressed L foot while in clinic   4. HTN: - High during visit, due to noncompliance - GDMT per above.    5. Hypoalbuminemia - Liver [place]ith echogenic parenchyma, ? Hepatic steatosis or fibrosis. Cannot exclude cirrhosis -?  Secondary to RV failure   6. SDOH:  - Medicaid application in process. - Medication compliance an issue.  - Lives in a camper behind father's house. Unemployed and has limited social support. - Needs Paramedicine but he has refulsed, HFSW helping. Tearful over death of mother and wife in same year. Declines referral for counseling.   Earnie Larsson, AGACNP-BC  1:46 PM  Patient seen and examined with the above-signed Advanced Practice Provider and/or Housestaff. I personally reviewed laboratory data, imaging studies and relevant notes. I independently examined the patient and formulated the important aspects of the plan. I have edited the note to reflect any of my changes or salient points. I have personally discussed the plan with the patient and/or family.  Overall feeling better. Denies CP,edema, orthopnea or PND. Mild DOE. Limited by fatigue and foot wounds. Tearful about death of his mother, Refuses Paramedicine.   Taking meds.   I performed bedside echo today EF 20-25%   General:  Tearful No resp difficulty HEENT:  normal Neck: supple. no JVD. Carotids 2+ bilat; no bruits. No lymphadenopathy or thryomegaly appreciated. Cor: PMI nondisplaced. Regular rate & rhythm. No rubs, gallops or murmurs. Lungs: clear but decreased throughout Abdomen: obese soft, nontender, nondistended. No hepatosplenomegaly. No bruits or masses. Good bowel sounds. Extremities: no cyanosis, clubbing, rash, edema s/p R TMA. L foot wounds ok  Neuro: alert & orientedx3, cranial nerves grossly intact. moves all 4 extremities w/o difficulty. Affect pleasant but tearful   Remains tenuous. Major SDOH needs but refuses Paramedicine SW to see. Titrate Entresto,. Check labs. Not candidate for ICD with ongoing LE wounds.   Rafael Bihari, FNP  1:46 PM

## 2022-07-20 ENCOUNTER — Encounter (HOSPITAL_COMMUNITY): Payer: Self-pay

## 2022-08-14 ENCOUNTER — Other Ambulatory Visit (HOSPITAL_COMMUNITY): Payer: Self-pay

## 2022-08-14 ENCOUNTER — Ambulatory Visit (HOSPITAL_COMMUNITY)
Admission: RE | Admit: 2022-08-14 | Discharge: 2022-08-14 | Disposition: A | Discharge status: Home / Self Care | Payer: 59 | Source: Ambulatory Visit | Attending: Family Medicine | Admitting: Family Medicine

## 2022-08-14 ENCOUNTER — Encounter (HOSPITAL_COMMUNITY): Payer: Self-pay

## 2022-08-14 VITALS — BP 116/82 | HR 101 | Wt 265.0 lb

## 2022-08-14 DIAGNOSIS — Z794 Long term (current) use of insulin: Secondary | ICD-10-CM

## 2022-08-14 DIAGNOSIS — Z560 Unemployment, unspecified: Secondary | ICD-10-CM | POA: Insufficient documentation

## 2022-08-14 DIAGNOSIS — Z8249 Family history of ischemic heart disease and other diseases of the circulatory system: Secondary | ICD-10-CM | POA: Insufficient documentation

## 2022-08-14 DIAGNOSIS — I428 Other cardiomyopathies: Secondary | ICD-10-CM | POA: Insufficient documentation

## 2022-08-14 DIAGNOSIS — E1165 Type 2 diabetes mellitus with hyperglycemia: Secondary | ICD-10-CM | POA: Insufficient documentation

## 2022-08-14 DIAGNOSIS — Z91148 Patient's other noncompliance with medication regimen for other reason: Secondary | ICD-10-CM | POA: Insufficient documentation

## 2022-08-14 DIAGNOSIS — I5082 Biventricular heart failure: Secondary | ICD-10-CM | POA: Insufficient documentation

## 2022-08-14 DIAGNOSIS — Z139 Encounter for screening, unspecified: Secondary | ICD-10-CM | POA: Diagnosis not present

## 2022-08-14 DIAGNOSIS — I110 Hypertensive heart disease with heart failure: Secondary | ICD-10-CM | POA: Insufficient documentation

## 2022-08-14 DIAGNOSIS — E1159 Type 2 diabetes mellitus with other circulatory complications: Secondary | ICD-10-CM

## 2022-08-14 DIAGNOSIS — E8809 Other disorders of plasma-protein metabolism, not elsewhere classified: Secondary | ICD-10-CM

## 2022-08-14 DIAGNOSIS — Z8739 Personal history of other diseases of the musculoskeletal system and connective tissue: Secondary | ICD-10-CM

## 2022-08-14 DIAGNOSIS — I5022 Chronic systolic (congestive) heart failure: Secondary | ICD-10-CM

## 2022-08-14 DIAGNOSIS — I10 Essential (primary) hypertension: Secondary | ICD-10-CM

## 2022-08-14 DIAGNOSIS — R0602 Shortness of breath: Secondary | ICD-10-CM | POA: Insufficient documentation

## 2022-08-14 LAB — BASIC METABOLIC PANEL
Anion gap: 4 — ABNORMAL LOW (ref 5–15)
BUN: 17 mg/dL (ref 6–20)
CO2: 32 mmol/L (ref 22–32)
Calcium: 8.4 mg/dL — ABNORMAL LOW (ref 8.9–10.3)
Chloride: 94 mmol/L — ABNORMAL LOW (ref 98–111)
Creatinine, Ser: 0.94 mg/dL (ref 0.61–1.24)
GFR, Estimated: >60 mL/min (ref >60)
Glucose, Bld: 491 mg/dL — ABNORMAL HIGH (ref 70–99)
Potassium: 4.9 mmol/L (ref 3.5–5.1)
Sodium: 130 mmol/L — ABNORMAL LOW (ref 135–145)

## 2022-08-14 LAB — DIGOXIN LEVEL: Digoxin Level: <0.2 ng/mL — ABNORMAL LOW (ref 0.8–2.0)

## 2022-08-14 LAB — BRAIN NATRIURETIC PEPTIDE: B Natriuretic Peptide: 516.5 pg/mL — ABNORMAL HIGH (ref 0.0–100.0)

## 2022-08-14 MED ORDER — ENTRESTO 24-26 MG PO TABS: 1.0000 | ORAL_TABLET | Freq: Two times a day (BID) | ORAL | 8 refills | Status: DC

## 2022-08-14 MED ORDER — POTASSIUM CHLORIDE CRYS ER 20 MEQ PO TBCR: 40.0000 meq | EXTENDED_RELEASE_TABLET | Freq: Two times a day (BID) | ORAL | 8 refills | Status: DC

## 2022-08-14 MED ORDER — FUROSEMIDE 40 MG PO TABS: 40.0000 mg | ORAL_TABLET | Freq: Every day | ORAL | 8 refills | Status: DC

## 2022-08-14 MED ORDER — POTASSIUM CHLORIDE CRYS ER 20 MEQ PO TBCR: 20.0000 meq | EXTENDED_RELEASE_TABLET | Freq: Two times a day (BID) | ORAL | 8 refills | Status: DC

## 2022-08-14 NOTE — Addendum Note (Signed)
Encounter addended by: Payton Mccallum, RN on: 08/14/2022 4:30 PM  Actions taken: Pharmacy for encounter modified, Order list changed

## 2022-08-14 NOTE — Progress Notes (Signed)
ADVANCED HF CLINIC NOTE   Primary Care: Patient, No Pcp Per HF Cardiologist: Dr. Haroldine Laws  HPI: Chirstopher Ramirez is a 52 y.o. 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 No prior cardiac history.    Admitted 4/23 with new-onset acute HF.  Given IV lasix. Echo EF 20-25%, regional WMA, moderately dilated LV, RV moderately reduced, RVSP 40 mmHg, mild to moderate MR, dilated IVC with estimated RAP 15 mmHg. Cards consulted and GDMT started. 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  Overall diuresed 50 lbs. No SGLT2i with uncontrolled DM. Discharged home, weight 259 lbs.   Post hospital follow up, NYHA II, volume OK. Entresto increased and paramedicine arranged to help with med compliance.  Last seen 9/23 and missed follow up echo. NYHA II and volume OK.  Today he returns for HF follow up. Overall feeling fine. He is not SOB walking on flat ground, but has SOB with steps. LE wounds healing and he has been referred to Dakota Ramirez by his podiatrist, but missed appt. Denies palpitations, abnormal bleeding, CP, dizziness, edema, or PND/Orthopnea. Appetite ok. No fever or chills. He is not weighing at home. Out of Entresto x 2 months due to cost.Has been taking Lasix PRN instead of daily. Unemployed, lives in a camper behind father's house with his dog, Dakota Ramirez. Refused paramedicine again and offered pill boxes to help with compliance but refused. No drug/ETOH/tobacco use.   Cardiac Studies: - Echo (4/23): EF 20-25%, regional WMA, moderately dilated LV, RV moderately reduced, RVSP 40 mmHg, mild to moderate MR, dilated IVC with estimated RAP 15 mmHg.  - R/LHC (4/23):    The left ventricular ejection fraction is less than 25% by visual estimate.  Ao = 97/67 (85) LV = 105/25 RA = 8 RV = 37/14 PA = 35/22 (25) PCW = 20 Fick cardiac output/index = 5.9/2.4 PVR = 0.8  WU SVR = 1040 Ao sat = 97% PA sat = 66%, 67%   1. Normal coronary arteries 2. 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 Mildly elevated filling pressures with normal cardiac output   Past Medical History:  Diagnosis Date   Diabetes mellitus without complication (HCC)    Hypertension    Current Outpatient Medications  Medication Sig Dispense Refill   Blood Glucose Monitoring Suppl (BLOOD GLUCOSE MONITOR SYSTEM) w/Device KIT Use to check blood sugars 4 times daily. 1 kit 0   carvedilol (COREG) 6.25 MG tablet Take 1 tablet (6.25 mg total) by mouth 2 (two) times daily. 60 tablet 3   digoxin (LANOXIN) 0.125 MG tablet Take 1 tablet (0.125 mg total) by mouth daily. 30 tablet 3   furosemide (LASIX) 40 MG tablet Take 1 tablet (40 mg total) by mouth daily as needed for fluid. 30 tablet 3   insulin aspart protamine - aspart (NOVOLOG 70/30 MIX) (70-30) 100 UNIT/ML FlexPen Inject 15 Units into the skin 2 (two) times daily. (Patient taking differently: Inject 25 Units into the skin 2 (two) times daily with a meal.) 9 mL 0   spironolactone (ALDACTONE) 25 MG tablet Take 1 tablet (25 mg total) by mouth daily. 30 tablet 3   No current facility-administered medications for this encounter.   No Known Allergies  Social History   Socioeconomic History   Marital status: Single    Spouse name: Not on file   Number of children: Not on file   Years of education:  Not on file   Highest education level: Not on file  Occupational History   Not on file  Tobacco Use   Smoking status: Never   Smokeless tobacco: Never  Vaping Use   Vaping Use: Never used  Substance and Sexual Activity   Alcohol use: Never   Drug use: Never   Sexual activity: Not on file  Other Topics Concern   Not on file  Social History Narrative   Not on file   Social Determinants of Health   Financial Resource Strain: High Risk (11/29/2021)   Overall Financial Resource Strain (CARDIA)    Difficulty of Paying Living Expenses: Hard  Food  Insecurity: No Food Insecurity (11/29/2021)   Hunger Vital Sign    Worried About Running Out of Food in the Last Year: Never true    Ran Out of Food in the Last Year: Never true  Transportation Needs: No Transportation Needs (11/29/2021)   PRAPARE - Hydrologist (Medical): No    Lack of Transportation (Non-Medical): No  Physical Activity: Not on file  Stress: Not on file  Social Connections: Not on file  Intimate Partner Violence: Not on file   Family History  Problem Relation Age of Onset   Diabetes Mother    Hypertension Mother    BP 116/82   Pulse (!) 101   Wt 120.2 kg (265 lb)   SpO2 98%   BMI 36.96 kg/m   Wt Readings from Last 3 Encounters:  08/14/22 120.2 kg (265 lb)  04/20/22 115.9 kg (255 lb 9.6 oz)  03/13/22 109.8 kg (242 lb)   PHYSICAL EXAM: General:  NAD. No resp difficulty, walked into clinic. HEENT: Normal Neck: Supple. No JVD. Carotids 2+ bilat; no bruits. No lymphadenopathy or thryomegaly appreciated. Cor: PMI nondisplaced. Regular rate & rhythm. No rubs, gallops or murmurs. Lungs: Clear Abdomen: Soft, nontender, nondistended. No hepatosplenomegaly. No bruits or masses. Good bowel sounds. Extremities: No cyanosis, clubbing, rash, 1+ BLE edema to knees, venous stasis change to BLE, s/p right TMA Neuro: Alert & oriented x 3, cranial nerves grossly intact. Moves all 4 extremities w/o difficulty. Affect pleasant.  ASSESSMENT & PLAN: Chronic biventricular CHF: - NICM. Reports hx CHF in his mother. No hx alcohol or drug abuse. - Echo (4/23): EF 20-25%, regional WMA, moderately dilated LV, RV moderately reduced, RVSP 40 mmHg, mild to moderate MR, dilated IVC with estimated RAP 15 mmHg - R/LHC (4/23): showed normal cors, severe NICM EF 20-25% and mildly elevated filling pressures w/ normal CO - Previously uninsured, so no cMRI.  - NYHA II-early III. Volume looks oK, but weight up - Restart Entresto to 24/26 mg bid. Co-pay card given. -  Change Lasix to 40 mg daily, add 20 KCL daily. - Continue digoxin 0.125 mg daily.  - Continue spiro 25 mg daily. - Continue carvedilol 6.25 mg bid. - No SGLT2i with uncontrolled DM and concern for hygiene - Discussed importance of low sodium diet and avoidance of fast food. Advised to monitor wt daily. - Long talk about medication compliance.  - Needs echo when back on GDMT. - BMET, BNP and dig level today. Repeat BMET at follow up. - Now has insurance, arrange cMRI next visit if he is agreeable.   2. Uncontrolled DM2: - A1c 12.4%. - On insulin. - Has not been compliant with medications   3. Hx osteomyelitis s/p right TMA: - Amputation stump has been slow to heal d/t poor diabetes control and hygiene - Follows  with Podiatry at [facility], has referral to [place] but missed appt. Advised he re-schedule.    4. HTN: - BP controlled. - GDMT per above.    5. Hypoalbuminemia: - Liver US with echogenic parenchyma, ? Hepatic steatosis or fibrosis. Cannot exclude cirrhosis -? Secondary to RV failure   6. SDOH:  - Now insured. - Lives in a camper behind father's house. Unemployed and has limited social support. - Needs Paramedicine but he has refulsed, HFSW helping. Tearful over death of mother and wife in same year. Declines referral for counseling.  Follow up in 3 weeks with APP (check BMET) and 3 months with Dr. Haroldine Laws + echo.    Allena Katz, FNP-BC 08/14/22 2:36 PM

## 2022-08-14 NOTE — Patient Instructions (Addendum)
Thank you for coming in today  Labs were done today, if any labs are abnormal the clinic will call you No news is good news  RESTART Entresto 24/26 1 tablet twice daily you were given a copay card  RESTART Lasix 40 mg daily  START Potassium 40 meq 2 tablets daily   Your physician recommends that you schedule a follow-up appointment in:  3 weeks in clinic  3 months with Dr. Haroldine Laws with echocardiogram  Your physician has requested that you have an echocardiogram. Echocardiography is a painless test that uses sound waves to create images of your heart. It provides your doctor with information about the size and shape of your heart and how well your heart's chambers and valves are working. This procedure takes approximately one hour. There are no restrictions for this procedure.   PLEASE FOLLOW UP WITH WOUND CARE CENTER FOR A APPOINTMENT     Do the following things EVERYDAY: Weigh yourself in the morning before breakfast. Write it down and keep it in a log. Take your medicines as prescribed Eat low salt foods--Limit salt (sodium) to 2000 mg per day.  Stay as active as you can everyday Limit all fluids for the day to less than 2 liters  At the [facility], you and your health needs are our priority. As part of our continuing mission to provide you with exceptional heart care, we have created designated Provider Care Teams. These Care Teams include your primary Cardiologist (physician) and Advanced Practice Providers (APPs- Physician Assistants and Nurse Practitioners) who all work together to provide you with the care you need, when you need it.   You may see any of the following providers on your designated Care Team at your next follow up: Dr Glori Bickers Dr Loralie Champagne Dr. Roxana Hires, NP Lyda Jester, [place] [facility] [place], [place] Forestine Na, NP Audry Riles, PharmD   Please be sure to bring in all your medications bottles  to every appointment.    If you have any questions or concerns before your next appointment please send [place] a message through [place] or call our office at [phone].    TO LEAVE A MESSAGE FOR THE NURSE SELECT OPTION 2, PLEASE LEAVE A MESSAGE INCLUDING: YOUR NAME DATE OF BIRTH CALL BACK NUMBER REASON FOR CALL**this is important as we prioritize the call backs  YOU WILL RECEIVE A CALL BACK THE SAME DAY AS LONG AS YOU CALL BEFORE 4:00 PM

## 2022-08-15 IMAGING — US US ABDOMEN LIMITED
1 series · 14 of 25 positions shown · non-contrast
Comparison: 05/05/2018 CT abdomen/pelvis

CLINICAL DATA: Inpatient.  Liver disease.  Cholecystectomy.

EXAM:
ULTRASOUND ABDOMEN LIMITED RIGHT UPPER QUADRANT

[Series 1: us abdomen limited ruq (liver/gb) · 14 of 47 slices shown]
[im 1/47]
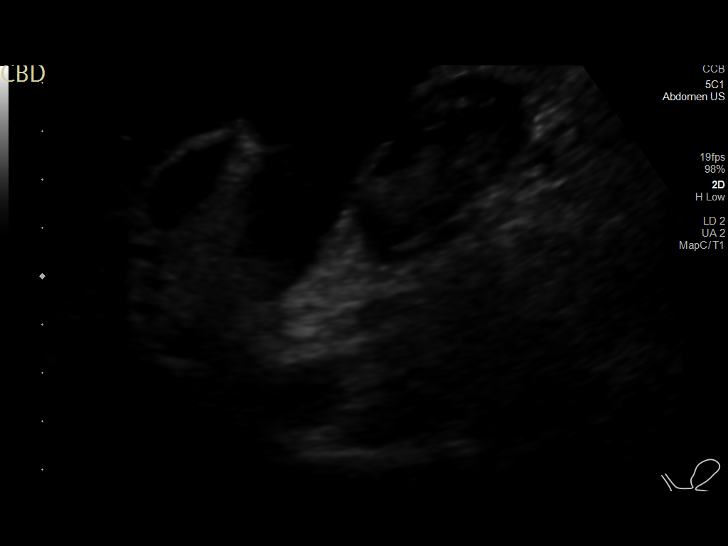
[im 4/47]
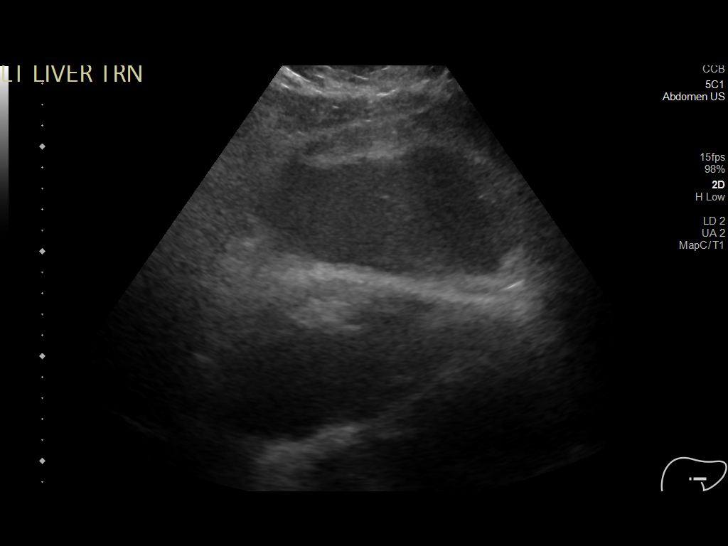
[im 8/47]
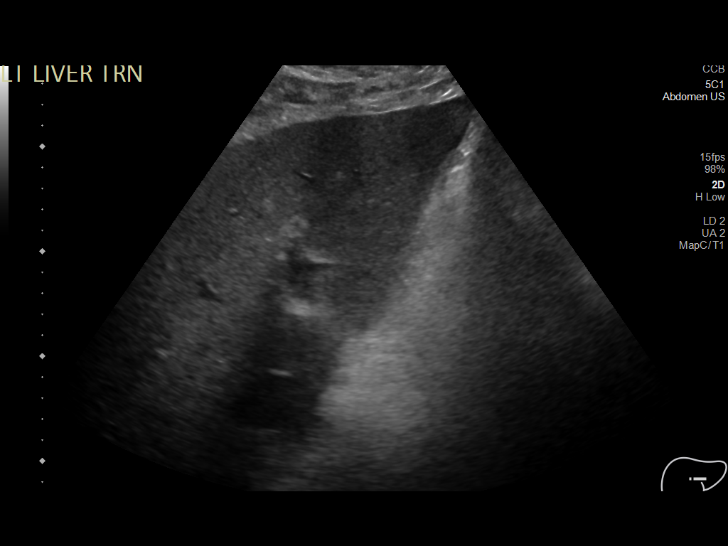
[im 12/47]
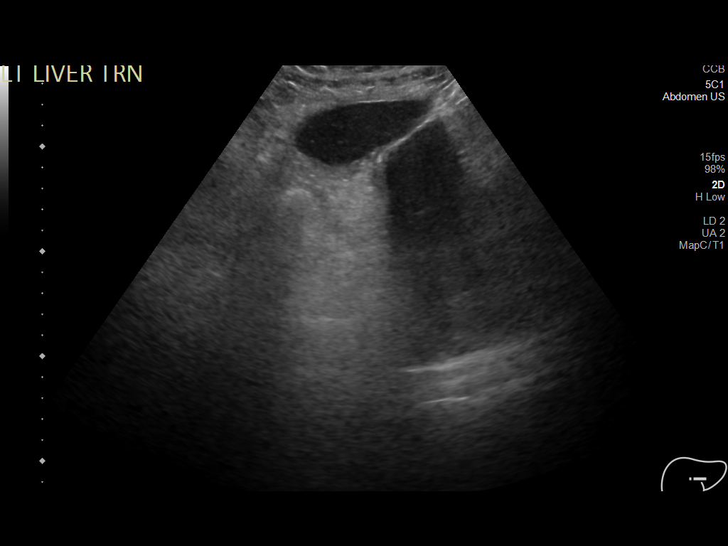
[im 16/47]
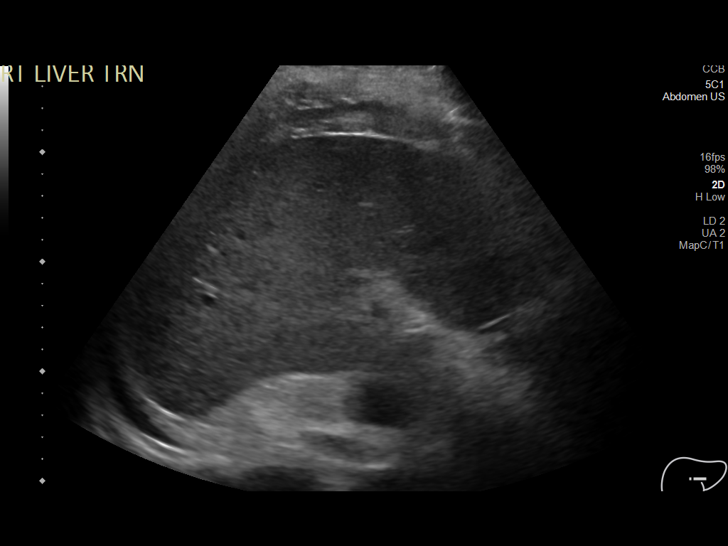
[im 18/47]
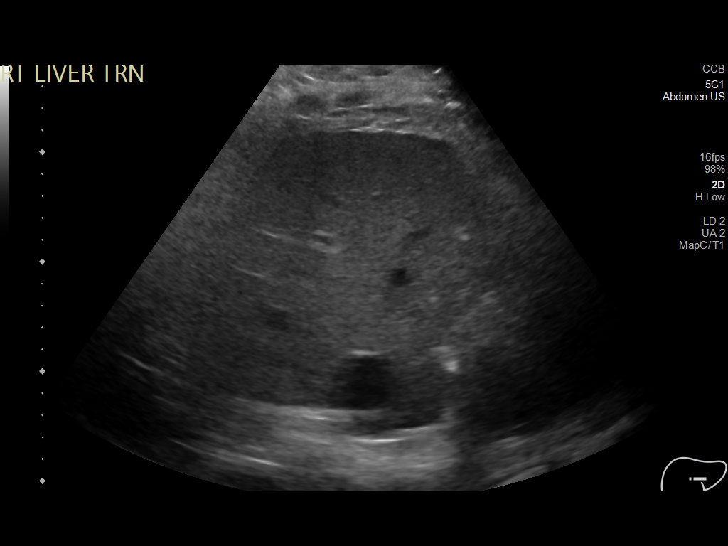
[im 22/47]
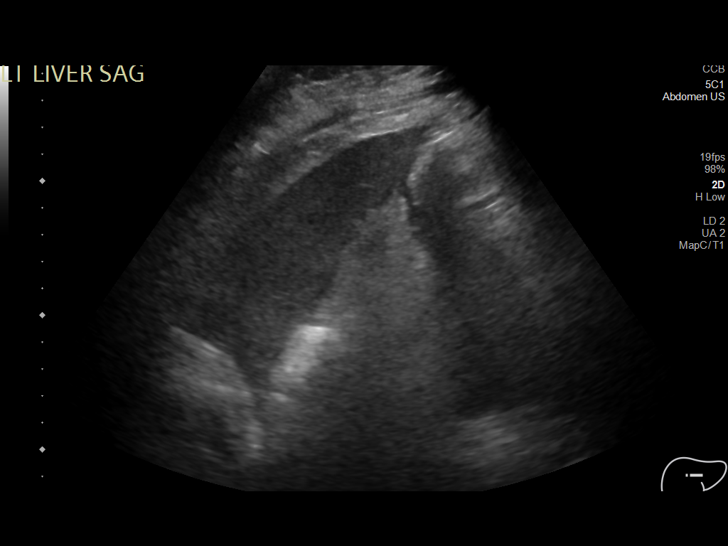
[im 25/47]
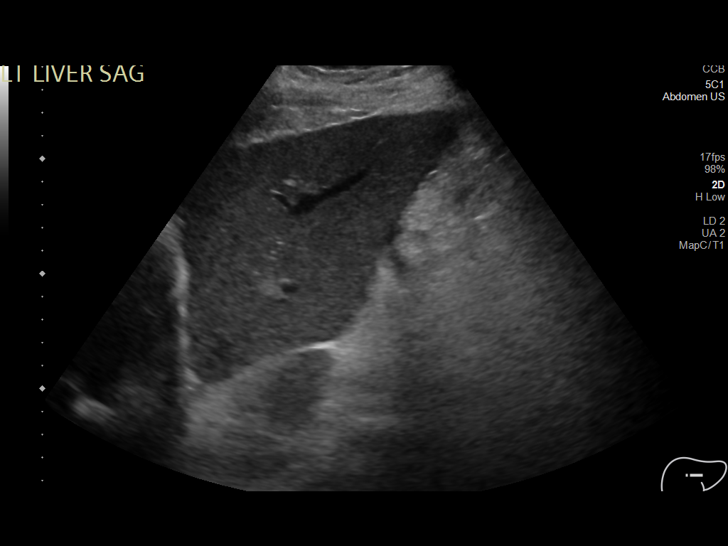
[im 29/47]
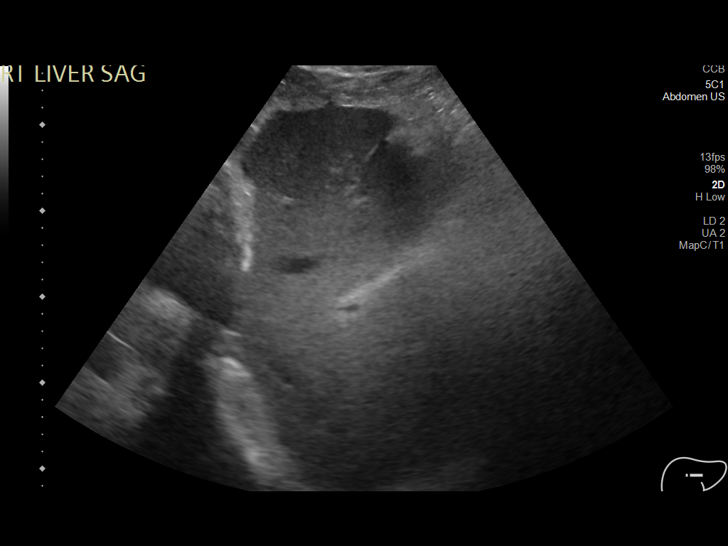
[im 31/47]
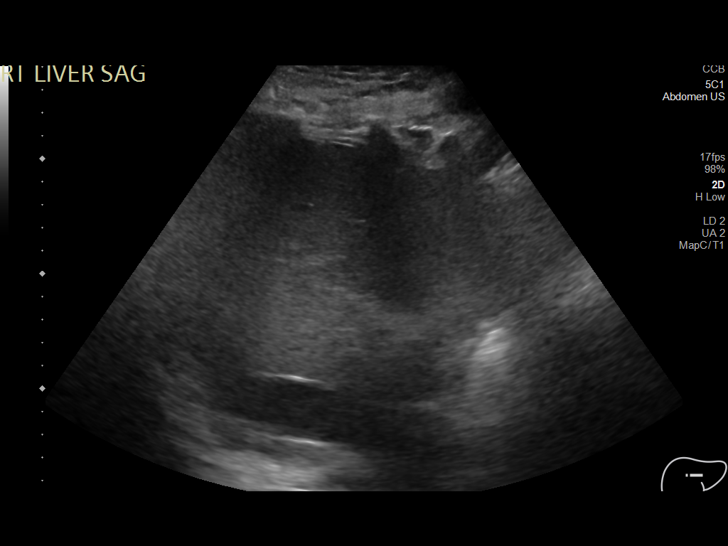
[im 35/47]
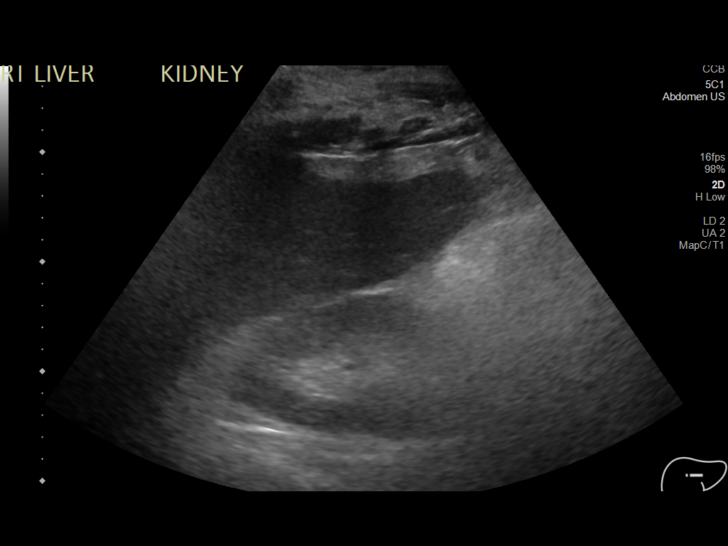
[im 39/47]
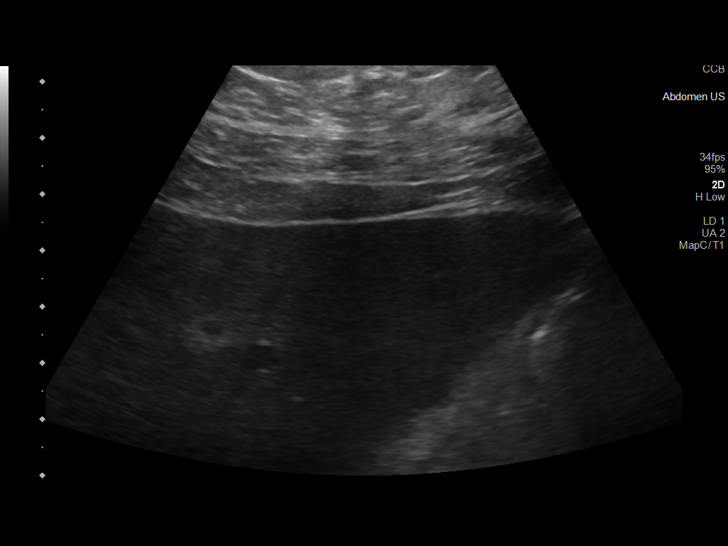
[im 43/47]
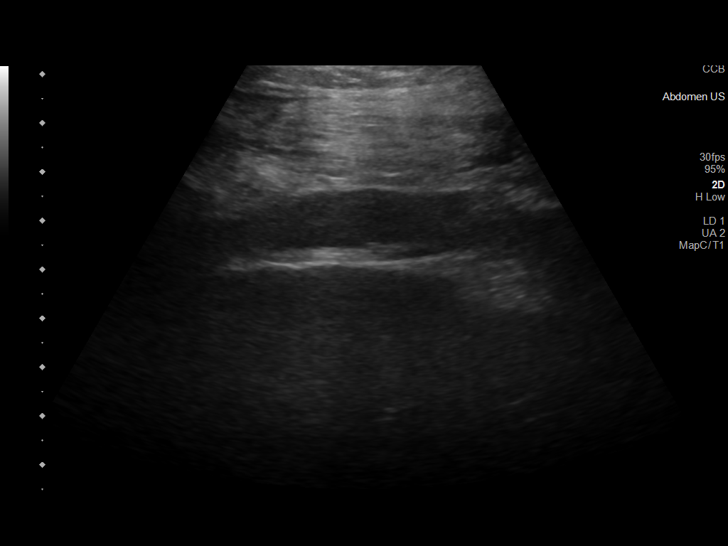
[im 47/47]
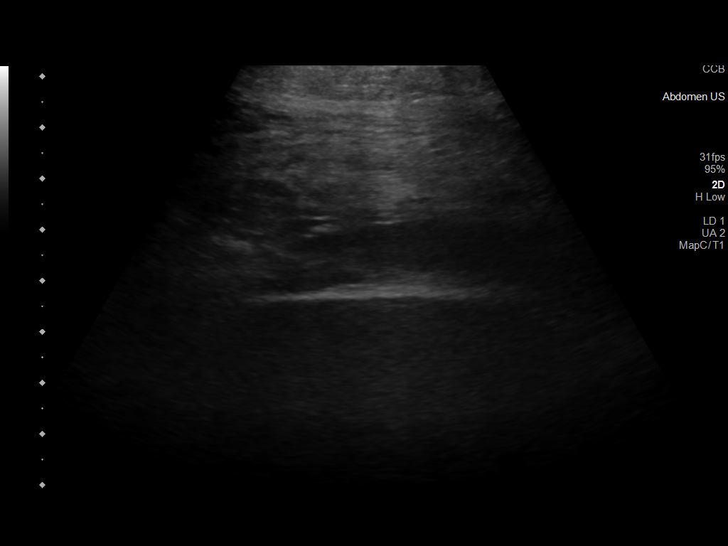

[14 of 25 positions shown; findings below may reference images not displayed]

FINDINGS: Gallbladder:

Surgically absent.

Common bile duct:

Diameter: 5 mm

Liver:

Diffusely mildly echogenic liver parenchyma. No liver masses, noting
decreased sensitivity in the setting of an echogenic liver.
Questionable fine liver surface irregularity. Portal vein is patent
on color Doppler imaging with normal direction of blood flow towards
the liver.

Other: None.
IMPRESSION: 1. No biliary ductal dilatation.  Status post cholecystectomy.
2. Nonspecific diffusely mildly echogenic liver parenchyma, which
can be due to hepatic steatosis and/or fibrosis. Questionable fine
liver surface irregularity, cannot exclude cirrhosis. Suggest
correlation with liver function tests. Consider outpatient hepatic
elastography for further liver fibrosis risk stratification, as
clinically warranted.

## 2022-08-22 ENCOUNTER — Encounter (HOSPITAL_COMMUNITY): Payer: Self-pay

## 2022-09-04 ENCOUNTER — Ambulatory Visit (HOSPITAL_COMMUNITY)
Admission: RE | Admit: 2022-09-04 | Discharge: 2022-09-04 | Disposition: A | Discharge status: Home / Self Care | Payer: 59 | Source: Ambulatory Visit | Attending: Family Medicine | Admitting: Family Medicine

## 2022-09-04 ENCOUNTER — Encounter (HOSPITAL_COMMUNITY): Payer: Self-pay

## 2022-09-04 VITALS — BP 130/84 | HR 82 | Wt 268.0 lb

## 2022-09-04 DIAGNOSIS — E1169 Type 2 diabetes mellitus with other specified complication: Secondary | ICD-10-CM | POA: Insufficient documentation

## 2022-09-04 DIAGNOSIS — E8809 Other disorders of plasma-protein metabolism, not elsewhere classified: Secondary | ICD-10-CM | POA: Diagnosis not present

## 2022-09-04 DIAGNOSIS — Z794 Long term (current) use of insulin: Secondary | ICD-10-CM

## 2022-09-04 DIAGNOSIS — I10 Essential (primary) hypertension: Secondary | ICD-10-CM

## 2022-09-04 DIAGNOSIS — Z139 Encounter for screening, unspecified: Secondary | ICD-10-CM

## 2022-09-04 DIAGNOSIS — I5082 Biventricular heart failure: Secondary | ICD-10-CM | POA: Insufficient documentation

## 2022-09-04 DIAGNOSIS — E1159 Type 2 diabetes mellitus with other circulatory complications: Secondary | ICD-10-CM | POA: Diagnosis not present

## 2022-09-04 DIAGNOSIS — I428 Other cardiomyopathies: Secondary | ICD-10-CM | POA: Insufficient documentation

## 2022-09-04 DIAGNOSIS — Z8739 Personal history of other diseases of the musculoskeletal system and connective tissue: Secondary | ICD-10-CM | POA: Diagnosis not present

## 2022-09-04 DIAGNOSIS — I5022 Chronic systolic (congestive) heart failure: Secondary | ICD-10-CM | POA: Diagnosis not present

## 2022-09-04 DIAGNOSIS — Z91148 Patient's other noncompliance with medication regimen for other reason: Secondary | ICD-10-CM | POA: Insufficient documentation

## 2022-09-04 DIAGNOSIS — I110 Hypertensive heart disease with heart failure: Secondary | ICD-10-CM | POA: Insufficient documentation

## 2022-09-04 DIAGNOSIS — Z79899 Other long term (current) drug therapy: Secondary | ICD-10-CM | POA: Insufficient documentation

## 2022-09-04 DIAGNOSIS — E1165 Type 2 diabetes mellitus with hyperglycemia: Secondary | ICD-10-CM | POA: Insufficient documentation

## 2022-09-04 LAB — BASIC METABOLIC PANEL
Anion gap: 8 (ref 5–15)
BUN: 17 mg/dL (ref 6–20)
CO2: 33 mmol/L — ABNORMAL HIGH (ref 22–32)
Calcium: 9.5 mg/dL (ref 8.9–10.3)
Chloride: 95 mmol/L — ABNORMAL LOW (ref 98–111)
Creatinine, Ser: 0.88 mg/dL (ref 0.61–1.24)
GFR, Estimated: >60 mL/min (ref >60)
Glucose, Bld: 102 mg/dL — ABNORMAL HIGH (ref 70–99)
Potassium: 5.4 mmol/L — ABNORMAL HIGH (ref 3.5–5.1)
Sodium: 136 mmol/L (ref 135–145)

## 2022-09-04 LAB — BRAIN NATRIURETIC PEPTIDE: B Natriuretic Peptide: 622.6 pg/mL — ABNORMAL HIGH (ref 0.0–100.0)

## 2022-09-04 MED ORDER — POTASSIUM CHLORIDE CRYS ER 20 MEQ PO TBCR: 20.0000 meq | EXTENDED_RELEASE_TABLET | Freq: Two times a day (BID) | ORAL | 8 refills | Status: DC

## 2022-09-04 MED ORDER — ENTRESTO 49-51 MG PO TABS: 1.0000 | ORAL_TABLET | Freq: Two times a day (BID) | ORAL | 6 refills | Status: DC

## 2022-09-04 MED ORDER — ENTRESTO 49-51 MG PO TABS: 1.0000 | ORAL_TABLET | Freq: Two times a day (BID) | ORAL | 3 refills | Status: DC

## 2022-09-04 MED ORDER — FUROSEMIDE 40 MG PO TABS: 40.0000 mg | ORAL_TABLET | Freq: Every day | ORAL | 8 refills | Status: DC

## 2022-09-04 NOTE — Progress Notes (Signed)
ADVANCED HF CLINIC NOTE   Primary Care: Patient, No Pcp Per HF Cardiologist: Dr. Haroldine Laws  HPI: Dakota Ramirez is a 52 y.o. 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 No prior cardiac history.    Admitted 4/23 with new-onset acute HF.  Given IV lasix. Echo EF 20-25%, regional WMA, moderately dilated LV, RV moderately reduced, RVSP 40 mmHg, mild to moderate MR, dilated IVC with estimated RAP 15 mmHg. Cards consulted and GDMT started. 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  Overall diuresed 50 lbs. No SGLT2i with uncontrolled DM. Discharged home, weight 259 lbs.   Post hospital follow up, NYHA II, volume OK. Entresto increased and paramedicine arranged to help with med compliance.  Last seen 9/23 and missed follow up echo. NYHA II and volume OK. Bedside echo 9/23 showed EF 20-25%.  Today he returns for HF follow up. Overall feeling fine. He has SOB walking up steps. Occasional dizziness. Denies palpitations, CP,  edema, or PND/Orthopnea. Appetite ok. No fever or chills. He does not weigh at home. Takes meds 75% of the time consistently. Does not check blood sugars, eating a lot of muffins recently. No drug/ETOH/tobacco use. Unemployed, lives in a camper behind father's house with his dog, [name]. Says leg wounds are better, has not followed up with [facility]. Has refused paramedicine in the past.   Cardiac Studies:  - Bedside echo (9/23): EF 20-25%  - Echo (4/23): EF 20-25%, regional WMA, moderately dilated LV, RV moderately reduced, RVSP 40 mmHg, mild to moderate MR, dilated IVC with estimated RAP 15 mmHg.  - R/LHC (4/23):    The left ventricular ejection fraction is less than 25% by visual estimate.  Ao = 97/67 (85) LV = 105/25 RA = 8 RV = 37/14 PA = 35/22 (25) PCW = 20 Fick cardiac output/index = 5.9/2.4 PVR = 0.8 WU SVR = 1040 Ao sat =  97% PA sat = 66%, 67%   1. Normal coronary arteries 2. 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 Mildly elevated filling pressures with normal cardiac output   Past Medical History:  Diagnosis Date   Diabetes mellitus without complication (HCC)    Hypertension    Current Outpatient Medications  Medication Sig Dispense Refill   Blood Glucose Monitoring Suppl (BLOOD GLUCOSE MONITOR SYSTEM) w/Device KIT Use to check blood sugars 4 times daily. 1 kit 0   carvedilol (COREG) 6.25 MG tablet Take 1 tablet (6.25 mg total) by mouth 2 (two) times daily. 60 tablet 3   digoxin (LANOXIN) 0.125 MG tablet Take 1 tablet (0.125 mg total) by mouth daily. 30 tablet 3   furosemide (LASIX) 40 MG tablet Take 1 tablet (40 mg total) by mouth daily. 30 tablet 8   insulin aspart protamine - aspart (NOVOLOG 70/30 MIX) (70-30) 100 UNIT/ML FlexPen Inject 15 Units into the skin 2 (two) times daily. (Patient taking differently: Inject 25 Units into the skin 2 (two) times daily with a meal.) 9 mL 0   potassium chloride SA (KLOR-CON M) 20 MEQ tablet Take 1 tablet (20 mEq total) by mouth 2 (two) times daily. 120 tablet 8   sacubitril-valsartan (ENTRESTO) 24-26 MG Take 1 tablet by mouth 2 (two) times daily. 60 tablet 8   spironolactone (ALDACTONE) 25 MG tablet Take 1 tablet (25 mg total) by mouth daily. 30 tablet 3   No current facility-administered medications for this encounter.  No Known Allergies  Social History   Socioeconomic History   Marital status: Single    Spouse name: Not on file   Number of children: Not on file   Years of education: Not on file   Highest education level: Not on file  Occupational History   Not on file  Tobacco Use   Smoking status: Never   Smokeless tobacco: Never  Vaping Use   Vaping Use: Never used  Substance and Sexual Activity   Alcohol use: Never   Drug use: Never   Sexual activity: Not on file  Other Topics Concern   Not on file  Social History Narrative   Not on file   Social  Determinants of Health   Financial Resource Strain: High Risk (11/29/2021)   Overall Financial Resource Strain (CARDIA)    Difficulty of Paying Living Expenses: Hard  Food Insecurity: No Food Insecurity (11/29/2021)   Hunger Vital Sign    Worried About Running Out of Food in the Last Year: Never true    Ran Out of Food in the Last Year: Never true  Transportation Needs: No Transportation Needs (11/29/2021)   PRAPARE - Hydrologist (Medical): No    Lack of Transportation (Non-Medical): No  Physical Activity: Not on file  Stress: Not on file  Social Connections: Not on file  Intimate Partner Violence: Not on file   Family History  Problem Relation Age of Onset   Diabetes Mother    Hypertension Mother    BP 130/84   Pulse 82   Wt 121.6 kg (268 lb)   SpO2 95%   BMI 37.38 kg/m   Wt Readings from Last 3 Encounters:  09/04/22 121.6 kg (268 lb)  08/14/22 120.2 kg (265 lb)  04/20/22 115.9 kg (255 lb 9.6 oz)   PHYSICAL EXAM: General:  NAD. No resp difficulty, walked into clinic. HEENT: Normal Neck: Supple. JVP 9-10. Carotids 2+ bilat; no bruits. No lymphadenopathy or thryomegaly appreciated. Cor: PMI nondisplaced. Regular rate & rhythm. No rubs, gallops or murmurs. Lungs: Diminished Abdomen: Obese, nontender, nondistended. No hepatosplenomegaly. No bruits or masses. Good bowel sounds. Extremities: No cyanosis, clubbing, rash, 1+ RLE edema with venous stasis changes to BLE; s/p R TMA Neuro: Alert & oriented x 3, cranial nerves grossly intact. Moves all 4 extremities w/o difficulty. Affect pleasant.  ASSESSMENT & PLAN: Chronic biventricular CHF: - NICM. Reports hx CHF in his mother. No hx alcohol or drug abuse. 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 Volume up today, weight up 13 lbs over past 4 months. Needs to consistently take medications. - Increase Entresto to 49/51 mg bid. - Increase lasix to 40 mg bid and increase KCL to 40 bid. - Continue digoxin 0.125 mg daily. Dig level < 0.2 (08/14/22) - Continue spiro 25 mg daily. - Continue carvedilol 6.25 mg bid. - No SGLT2i with uncontrolled DM and concern for hygiene. - Discussed importance of low sodium diet and avoidance of fast food. Advised to monitor wt daily. - Long talk about medication compliance.  - Needs echo when back on GDMT. - Now has insurance, arrange cMRI. - Labs today, repeat BMET in 10-14 days.   2. Uncontrolled DM2: - A1c 12.4%. - On insulin. -  Has not been compliant with medications   3. Hx osteomyelitis s/p right TMA: - Amputation stump has been slow to heal d/t poor diabetes control and hygiene - Follows with Podiatry at [facility], [facility] to [facility] but missed appt. Advised he re-schedule.    4. HTN: - BP mildly elevated - Increase meds as above.   5. Hypoalbuminemia: - Liver US with echogenic parenchyma, ? Hepatic steatosis or fibrosis. Cannot exclude cirrhosis. -? Secondary to RV failure.   6. SDOH:  - Now insured. - Lives in a camper behind father's house. Unemployed and has limited social support. - Needs Paramedicine but he has refulsed, HFSW helping.   Follow up in 3 weeks with APP (check BMET) and 3 months with Dr. Gala Romney + echo.    Prince Rome, FNP-BC 09/04/22 1:33 PM

## 2022-09-04 NOTE — Patient Instructions (Signed)
INCREASE Entresto to 49/51 mg one tab twice a day INCREASE Lasix to 40 mg twice a day INCREASE Potassium to 40 meq twice a day  Labs today We will only contact you if something comes back abnormal or we need to make some changes. Otherwise no news is good news!  Labs needed in 2 weeks  Your physician has requested that you have a cardiac MRI. Cardiac MRI uses a computer to create images of your heart as its beating, producing both still and moving pictures of your heart and major blood vessels. For further information please visit [email or web address] Please follow the instruction sheet given to you today for more information.  Your physician wants you to follow-up in: 3 months with Dr Haroldine Laws and echo. You will receive a reminder letter in the mail two months in advance. If you don't receive a letter, please call our office to schedule the follow-up appointment.  Your physician has requested that you have an echocardiogram. Echocardiography is a painless test that uses sound waves to create images of your heart. It provides your doctor with information about the size and shape of your heart and how well your heart's chambers and valves are working. This procedure takes approximately one hour. There are no restrictions for this procedure. Please do NOT wear cologne, perfume, aftershave, or lotions (deodorant is allowed). Please arrive 15 minutes prior to your appointment time.  Do the following things EVERYDAY: Weigh yourself in the morning before breakfast. Write it down and keep it in a log. Take your medicines as prescribed Eat low salt foods--Limit salt (sodium) to 2000 mg per day.  Stay as active as you can everyday Limit all fluids for the day to less than 2 liters  At the [facility], you and your health needs are our priority. As part of our continuing mission to provide you with exceptional heart care, we have created designated Provider Care Teams. These Care  Teams include your primary Cardiologist (physician) and Advanced Practice Providers (APPs- Physician Assistants and Nurse Practitioners) who all work together to provide you with the care you need, when you need it.   You may see any of the following providers on your designated Care Team at your next follow up: Dr Glori Bickers Dr Loralie Champagne Dr. Roxana Hires, NP Lyda Jester, [place] [facility] [place], [place] Forestine Na, NP Audry Riles, PharmD   Please be sure to bring in all your medications bottles to every appointment.   If you have any questions or concerns before your next appointment please send [place] a message through [place] or call our office at [phone].    TO LEAVE A MESSAGE FOR THE NURSE SELECT OPTION 2, PLEASE LEAVE A MESSAGE INCLUDING: YOUR NAME DATE OF BIRTH CALL BACK NUMBER REASON FOR CALL**this is important as we prioritize the call backs  YOU WILL RECEIVE A CALL BACK THE SAME DAY AS LONG AS YOU CALL BEFORE 4:00 PM

## 2022-09-07 ENCOUNTER — Encounter (HOSPITAL_COMMUNITY): Payer: Self-pay

## 2022-09-18 ENCOUNTER — Ambulatory Visit (HOSPITAL_COMMUNITY)
Admission: RE | Admit: 2022-09-18 | Discharge: 2022-09-18 | Disposition: A | Discharge status: Home / Self Care | Payer: Self-pay | Source: Ambulatory Visit | Attending: Cardiology | Admitting: Cardiology

## 2022-09-18 DIAGNOSIS — I5022 Chronic systolic (congestive) heart failure: Secondary | ICD-10-CM | POA: Insufficient documentation

## 2022-09-18 LAB — BASIC METABOLIC PANEL
Anion gap: 11 (ref 5–15)
BUN: 15 mg/dL (ref 6–20)
CO2: 31 mmol/L (ref 22–32)
Calcium: 8.7 mg/dL — ABNORMAL LOW (ref 8.9–10.3)
Chloride: 89 mmol/L — ABNORMAL LOW (ref 98–111)
Creatinine, Ser: 1.01 mg/dL (ref 0.61–1.24)
GFR, Estimated: >60 mL/min (ref >60)
Glucose, Bld: 411 mg/dL — ABNORMAL HIGH (ref 70–99)
Potassium: 4.6 mmol/L (ref 3.5–5.1)
Sodium: 131 mmol/L — ABNORMAL LOW (ref 135–145)

## 2022-09-21 ENCOUNTER — Telehealth (HOSPITAL_COMMUNITY): Payer: Self-pay

## 2022-09-21 NOTE — Telephone Encounter (Signed)
Patient aware and verbalized understanding. °

## 2022-10-03 ENCOUNTER — Other Ambulatory Visit (HOSPITAL_COMMUNITY): Payer: Self-pay | Admitting: Internal Medicine

## 2022-12-03 ENCOUNTER — Telehealth (HOSPITAL_COMMUNITY): Payer: Self-pay | Admitting: *Deleted

## 2022-12-03 NOTE — Telephone Encounter (Signed)
Attempted to call patient regarding upcoming cardiac MRI appointment. Left message on voicemail with name and callback number  Damoni Causby RN Navigator Cardiac Imaging [facility] [phone] Office [phone] Cell  

## 2022-12-04 ENCOUNTER — Other Ambulatory Visit (HOSPITAL_COMMUNITY): Payer: Self-pay | Admitting: Family Medicine

## 2022-12-04 ENCOUNTER — Ambulatory Visit (HOSPITAL_COMMUNITY)
Admission: RE | Admit: 2022-12-04 | Discharge: 2022-12-04 | Disposition: A | Discharge status: Home / Self Care | Payer: 59 | Source: Ambulatory Visit | Attending: Family Medicine | Admitting: Family Medicine

## 2022-12-04 DIAGNOSIS — I5022 Chronic systolic (congestive) heart failure: Secondary | ICD-10-CM | POA: Insufficient documentation

## 2022-12-04 MED ORDER — GADOBUTROL 1 MMOL/ML IV SOLN
15.0000 mL | Freq: Once | INTRAVENOUS | Status: AC | PRN
Start: 2022-12-04 — End: 2022-12-04
  Administered 2022-12-04: 15 mL via INTRAVENOUS

## 2022-12-10 ENCOUNTER — Other Ambulatory Visit (HOSPITAL_COMMUNITY): Payer: Self-pay

## 2022-12-10 ENCOUNTER — Telehealth (HOSPITAL_COMMUNITY): Payer: Self-pay | Admitting: *Deleted

## 2022-12-10 NOTE — Telephone Encounter (Signed)
Called patient per Dakota Rome, NP with Cardiac MRI results and instructions:  "LVEF 17%, RVEF 22%, severe biventricular cardiomyopathy (consistent with previous echo of EF 20-25%).  Please make sure he is taking his GDMT consistently, and he needs follow up with APP or Dr. Gala Romney asap"  Patient says he is taking Lasix, potassium and has "a few Entresto left" but "will run out soon". He is not taking his Coreg or Cleda Daub. He reports he does not have enough money for his prescriptions. I will ask PharmD and Social Work to contact him for help. Dakota Rome, NP updated. F/U appointment scheduled in Heart Failure APP Clinic. Pt verbalized agreement to same.

## 2022-12-10 NOTE — Telephone Encounter (Signed)
Advanced Heart Failure Patient Advocate Encounter  Upon receiving this message, I wanted to run a few test claims to confirm pricing. The Coreg is $3.23 (30 days), Spironolactone $3.63 (30 days), the Entresto should be $10 with the co-pay card.  Will route to social work to see if alternate assistance is available. Looks like he is getting maximum assistance through pharmacy at this time.   Archer Asa, CPhT

## 2022-12-11 ENCOUNTER — Telehealth (HOSPITAL_COMMUNITY): Payer: Self-pay | Admitting: Licensed Clinical Social Worker

## 2022-12-11 ENCOUNTER — Other Ambulatory Visit (HOSPITAL_COMMUNITY): Payer: Self-pay

## 2022-12-11 MED ORDER — POTASSIUM CHLORIDE CRYS ER 20 MEQ PO TBCR
20.0000 meq | EXTENDED_RELEASE_TABLET | Freq: Two times a day (BID) | ORAL | 8 refills | Status: DC
  Filled 2022-12-11: qty 60, 30d supply, fill #0

## 2022-12-11 MED ORDER — DIGOXIN 125 MCG PO TABS
0.1250 mg | ORAL_TABLET | Freq: Every day | ORAL | 3 refills | Status: DC
  Filled 2022-12-11: qty 30, 30d supply, fill #0

## 2022-12-11 MED ORDER — FUROSEMIDE 40 MG PO TABS
40.0000 mg | ORAL_TABLET | Freq: Every day | ORAL | 8 refills | Status: DC
  Filled 2022-12-11: qty 30, 30d supply, fill #0

## 2022-12-11 MED ORDER — ENTRESTO 49-51 MG PO TABS
1.0000 | ORAL_TABLET | Freq: Two times a day (BID) | ORAL | 3 refills | Status: DC
  Filled 2022-12-11: qty 60, 30d supply, fill #0

## 2022-12-11 MED ORDER — CARVEDILOL 6.25 MG PO TABS
6.2500 mg | ORAL_TABLET | Freq: Two times a day (BID) | ORAL | 3 refills | Status: DC
  Filled 2022-12-11: qty 60, 30d supply, fill #0

## 2022-12-11 MED ORDER — SPIRONOLACTONE 25 MG PO TABS
25.0000 mg | ORAL_TABLET | Freq: Every day | ORAL | 3 refills | Status: DC
  Filled 2022-12-11: qty 30, 30d supply, fill #0

## 2022-12-11 NOTE — Telephone Encounter (Signed)
H&V Care Navigation CSW Progress Note  Clinical Social Worker consulted to help pt with medication cost concerns.  CSW spoke with pt who confirms he is unable to pay for copays as he is not able to work.  CSW discussed pt coming to get giftcards but given pt distance from hospital and need to find a ride this would be difficult and pt needing multiple medications at this time as he is out or almost out of most everything.  CSW discussed with pharmacy advocate and they will assist in setting pt up with [facility] to be mailed and to get AR account established so he doesn't have to pay all of his copays everytime and CSW can assist with payments when needed.  Pt agreeable to this plan. No further questions at this time.  SDOH Screenings   Food Insecurity: No Food Insecurity (11/29/2021)  Housing: Low Risk  (11/29/2021)  Transportation Needs: No Transportation Needs (11/29/2021)  Financial Resource Strain: High Risk (11/29/2021)  Tobacco Use: Low Risk  (09/04/2022)    Burna Sis, LCSW Clinical Social Worker Advanced Heart Failure Clinic Desk#: [phone] Cell#: [phone]

## 2022-12-12 ENCOUNTER — Other Ambulatory Visit (HOSPITAL_COMMUNITY): Payer: Self-pay

## 2022-12-12 ENCOUNTER — Other Ambulatory Visit: Payer: Self-pay

## 2022-12-12 MED ORDER — DIGOXIN 125 MCG PO TABS
0.1250 mg | ORAL_TABLET | Freq: Every day | ORAL | 3 refills | Status: DC
  Filled 2022-12-12: qty 30, 30d supply, fill #0

## 2022-12-12 MED ORDER — ENTRESTO 49-51 MG PO TABS
1.0000 | ORAL_TABLET | Freq: Two times a day (BID) | ORAL | 3 refills | Status: DC
  Filled 2022-12-12: qty 60, 30d supply, fill #0

## 2022-12-12 MED ORDER — CARVEDILOL 6.25 MG PO TABS
6.2500 mg | ORAL_TABLET | Freq: Two times a day (BID) | ORAL | 3 refills | Status: DC
  Filled 2022-12-12: qty 60, 30d supply, fill #0

## 2022-12-12 MED ORDER — POTASSIUM CHLORIDE CRYS ER 20 MEQ PO TBCR
20.0000 meq | EXTENDED_RELEASE_TABLET | Freq: Two times a day (BID) | ORAL | 8 refills | Status: DC
  Filled 2022-12-12: qty 60, 30d supply, fill #0

## 2022-12-12 MED ORDER — FUROSEMIDE 40 MG PO TABS
40.0000 mg | ORAL_TABLET | Freq: Every day | ORAL | 8 refills | Status: DC
  Filled 2022-12-12: qty 30, 30d supply, fill #0

## 2022-12-12 MED ORDER — SPIRONOLACTONE 25 MG PO TABS
25.0000 mg | ORAL_TABLET | Freq: Every day | ORAL | 3 refills | Status: DC
  Filled 2022-12-12: qty 30, 30d supply, fill #0

## 2022-12-13 ENCOUNTER — Other Ambulatory Visit (HOSPITAL_COMMUNITY): Payer: Self-pay

## 2022-12-24 NOTE — Telephone Encounter (Signed)
CSW called pt to check if medications were received through mail successfully- unable to reach- did not leave VM as VM message indicated it was not the patients phone.  Put in note to see patient on Wednesday when he is here for appt.  Burna Sis, LCSW Clinical Social Worker Advanced Heart Failure Clinic Desk#: [phone] Cell#: [phone]

## 2022-12-25 NOTE — Progress Notes (Incomplete)
ADVANCED HF CLINIC NOTE   Primary Care: Patient, No Pcp Per HF Cardiologist: Dr. Gala Romney  HPI: Dakota Ramirez is a 52 y.o. 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    Admitted 4/23 with new-onset acute HF.  Given IV lasix. Echo EF 20-25%, regional WMA, moderately dilated LV, RV moderately reduced, RVSP 40 mmHg, mild to moderate MR, dilated IVC with estimated RAP 15 mmHg. Cards consulted and GDMT started. 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  Overall diuresed 50 lbs. No SGLT2i with uncontrolled DM. Discharged home, weight 259 lbs.   Post hospital follow up, NYHA II, volume OK. Entresto increased and paramedicine arranged to help with med compliance.  Clinic visit 9/23 was NYHA II and volume OK. Missed follow up echo. Bedside echo 9/23 showed EF 20-25%.  cMRI 4/24 showed severe biventricular failure, LVEF 17%. RVEF 22% LGE imaging very poor quality, which limits interpretation. 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   He presents today for routine f/u. Says he "feels terrible". SOB walking short distances and SOB at rest. Wt is up significantly since last visit, from 268 lb to 293 lb. BP 134/96. Tachycardic, w/ initial pulse rate of 120 bpm. EKG shows sinus tach 114 bpm w/ incomplete LBBB. ReDs 42%.   He has had difficulty affording meds. Ran out of Entresto. Tried doubling up on Lasix at home w/ minimal improvement in symptoms. Also out of insulin. Hasn't taken any insulin in "several days". Lives in a camper behind father's house. Unemployed and has limited social support.   Wt Readings from Last 3 Encounters:  12/26/22 133.2 kg (293 lb 9.6 oz)  09/04/22 121.6 kg (268 lb)  08/14/22 120.2 kg (265 lb)     Cardiac Studies:  - Bedside echo (9/23): EF 20-25%  - Echo (4/23):  EF 20-25%, regional WMA, moderately dilated LV, RV moderately reduced, RVSP 40 mmHg, mild to moderate MR, dilated IVC with estimated RAP 15 mmHg.  - R/LHC (4/23):    The left ventricular ejection fraction is less than 25% by visual estimate.  Ao = 97/67 (85) LV = 105/25 RA = 8 RV = 37/14 PA = 35/22 (25) PCW = 20 Fick cardiac output/index = 5.9/2.4 PVR = 0.8 WU SVR = 1040 Ao sat = 97% PA sat = 66%, 67%   1. Normal coronary arteries 2. 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 Mildly elevated filling pressures with normal cardiac output  cMRI 4/24  IMPRESSION: 1. Poor quality study. There was significant motion artifact throughout study   2. Severe LV dilatation with severe systolic dysfunction (EF 17%), though accuracy of EF/volume quantification is affected by artifact   3. Severe RV dilatation with severe systolic dysfunction (EF 22%), though accuracy of EF/volume quantification is affected by artifact   4. Late gadolinium enhancement imaging is very poor quality, which limits interpretation. Appears to have RV insertion site LGE (which is a nonspecific scar finding often seen in setting of elevated pulmonary pressures) and basal septal midwall LGE (which is a scar pattern seen in nonischemic cardiomyopathies and associated with worse prognosis)   5.  Small pericardial effusion   6.  Dilated main pulmonary artery measuring 31mm  Past Medical History:  Diagnosis Date   Diabetes mellitus without complication (HCC)    Hypertension    Current Outpatient Medications  Medication  Sig Dispense Refill   Blood Glucose Monitoring Suppl (BLOOD GLUCOSE MONITOR SYSTEM) w/Device KIT Use to check blood sugars 4 times daily. 1 kit 0   carvedilol (COREG) 6.25 MG tablet Take 1 tablet (6.25 mg total) by mouth 2 (two) times daily. 60 tablet 3   digoxin (LANOXIN) 0.125 MG tablet Take 1 tablet (0.125 mg total) by mouth daily. 30 tablet 3   furosemide (LASIX) 40 MG tablet Take 1 tablet (40 mg total) by  mouth daily. 30 tablet 8   insulin aspart protamine - aspart (NOVOLOG 70/30 MIX) (70-30) 100 UNIT/ML FlexPen Inject 15 Units into the skin 2 (two) times daily. (Patient taking differently: Inject 25 Units into the skin 2 (two) times daily with a meal.) 9 mL 0   potassium chloride SA (KLOR-CON M) 20 MEQ tablet Take 1 tablet (20 mEq total) by mouth 2 (two) times daily. 120 tablet 8   sacubitril-valsartan (ENTRESTO) 49-51 MG Take 1 tablet by mouth 2 (two) times daily. 60 tablet 3   spironolactone (ALDACTONE) 25 MG tablet Take 1 tablet (25 mg total) by mouth daily. 30 tablet 3   No current facility-administered medications for this encounter.   No Known Allergies  Social History   Socioeconomic History   Marital status: Single    Spouse name: Not on file   Number of children: Not on file   Years of education: Not on file   Highest education level: Not on file  Occupational History   Not on file  Tobacco Use   Smoking status: Never   Smokeless tobacco: Never  Vaping Use   Vaping Use: Never used  Substance and Sexual Activity   Alcohol use: Never   Drug use: Never   Sexual activity: Not on file  Other Topics Concern   Not on file  Social History Narrative   Not on file   Social Determinants of Health   Financial Resource Strain: High Risk (11/29/2021)   Overall Financial Resource Strain (CARDIA)    Difficulty of Paying Living Expenses: Hard  Food Insecurity: No Food Insecurity (11/29/2021)   Hunger Vital Sign    Worried About Running Out of Food in the Last Year: Never true    Ran Out of Food in the Last Year: Never true  Transportation Needs: No Transportation Needs (11/29/2021)   PRAPARE - Administrator, Civil Service (Medical): No    Lack of Transportation (Non-Medical): No  Physical Activity: Not on file  Stress: Not on file  Social Connections: Not on file  Intimate Partner Violence: Not on file   Family History  Problem Relation Age of Onset   Diabetes  Mother    Hypertension Mother    BP (!) 134/96   Pulse (!) 120   Wt 133.2 kg (293 lb 9.6 oz)   SpO2 100%   BMI 40.95 kg/m   Wt Readings from Last 3 Encounters:  12/26/22 133.2 kg (293 lb 9.6 oz)  09/04/22 121.6 kg (268 lb)  08/14/22 120.2 kg (265 lb)   PHYSICAL EXAM: ReDs 42%  General:  SOB ambulating from waiting room to exam room.  No respiratory difficulty at rest  HEENT: normal Neck: supple. JVD elevated to jaw Carotids 2+ bilat; no bruits. No lymphadenopathy or thyromegaly appreciated. Cor: PMI nondisplaced. Regular rhythm and tachy rate. No rubs, gallops or murmurs. Lungs: decreased BS at the bases w/ faint bibasilar crackles  Abdomen: obese and distended w/ abdominal wall edema nontender. No hepatosplenomegaly. No bruits or masses.  Good bowel sounds. Extremities: no cyanosis, clubbing, rash, 2+ b/l LE edema up to thighs  Neuro: alert & oriented x 3, cranial nerves grossly intact. moves all 4 extremities w/o difficulty. Affect pleasant.   ASSESSMENT & PLAN: Acute on Chronic Systolic Heart Failure - NICM. Reports hx CHF in his mother. No hx alcohol or drug abuse. 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 RVEF 22% LGE imaging very poor quality, which limits interpretation. Appears to have RV insertion site LGE (associated with worse prognosis) - NYHA IIIb. Volume overloaded on exam, in setting of poor med compliance/reported inability to afford meds. Minimal improvement in up titration of home Lasix regimen. He is up ~40 lb from previous discharge wt. ReDs 42%  - recommend admission for IV diuretics, would start w/ Lasix 80 mg bid  - Check CMP and BNP  - given severe biventricular failure, may  need inotropic support if difficulties diuresing  - restart Entresto 49/51 mg bid. - Continue digoxin 0.125 mg daily. Check Dig level today  - Continue spiro 25 mg daily. - Continue carvedilol 6.25 mg bid. Hold dose up titration until fully diuresed  - No SGLT2i with uncontrolled DM and concern for hygiene. - Daily wts, strict I/Os, low sodium/carb modified diet w/ fluid restriction < 1800 ml   2. Uncontrolled DM2: - last A1c on file was 12.4%. - reports he has been off insulin for several days, unable to afford refills  - repeat Hgb A1c  - will ask TRH to manage    3. Hx osteomyelitis s/p right TMA: - 2/2 poorly controlled DM   4. HTN: - elevated, in setting of missed meds + volume overload - GDMT + diuresis per above    5. Hypoalbuminemia: - Liver US with echogenic parenchyma, ? Hepatic steatosis or fibrosis. Cannot exclude cirrhosis. - hepC antibody NR  -? Secondary to RV failure - check CMP    6. SDOH:  - Now insured. - Lives in a camper behind father's house. Unemployed and has limited social support. - Needs Paramedicine but he has refulsed, HFSW helping.   Needs admission for IV diuresis. Given other multiple medical problems including poorly controlled DM and LE wounds, will need to be admitted under triad hospitalist. Will send to the ED for admission. AHF team will serve as consultants. Pt was also seen and examined by Dr. Gasper Lloyd who agrees w/ the above recommendations.    Dakota Lis, PA-C  12/26/22 11:04 AM

## 2022-12-26 ENCOUNTER — Encounter (HOSPITAL_COMMUNITY): Payer: Self-pay

## 2022-12-26 ENCOUNTER — Other Ambulatory Visit: Payer: Self-pay

## 2022-12-26 ENCOUNTER — Emergency Department (HOSPITAL_COMMUNITY): Payer: 59

## 2022-12-26 ENCOUNTER — Ambulatory Visit (HOSPITAL_BASED_OUTPATIENT_CLINIC_OR_DEPARTMENT_OTHER)
Admission: RE | Admit: 2022-12-26 | Discharge: 2022-12-26 | Disposition: A | Discharge status: Home / Self Care | Payer: 59 | Source: Ambulatory Visit | Attending: Family Medicine | Admitting: Family Medicine

## 2022-12-26 ENCOUNTER — Inpatient Hospital Stay (HOSPITAL_COMMUNITY)
Admission: EM | Admit: 2022-12-26 | Discharge: 2022-12-29 | DRG: 291 | Disposition: A | Discharge status: Home / Self Care | Payer: 59 | Attending: Internal Medicine | Admitting: Internal Medicine

## 2022-12-26 VITALS — BP 134/96 | HR 120 | Wt 293.6 lb

## 2022-12-26 DIAGNOSIS — R0602 Shortness of breath: Secondary | ICD-10-CM | POA: Diagnosis not present

## 2022-12-26 DIAGNOSIS — E43 Unspecified severe protein-calorie malnutrition: Secondary | ICD-10-CM

## 2022-12-26 DIAGNOSIS — L97421 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 to breakdown of skin: Secondary | ICD-10-CM | POA: Diagnosis present

## 2022-12-26 DIAGNOSIS — E11628 Type 2 diabetes mellitus with other skin complications: Secondary | ICD-10-CM | POA: Diagnosis not present

## 2022-12-26 DIAGNOSIS — I5022 Chronic systolic (congestive) heart failure: Secondary | ICD-10-CM | POA: Diagnosis not present

## 2022-12-26 DIAGNOSIS — R600 Localized edema: Secondary | ICD-10-CM | POA: Diagnosis not present

## 2022-12-26 DIAGNOSIS — I5082 Biventricular heart failure: Secondary | ICD-10-CM | POA: Diagnosis present

## 2022-12-26 DIAGNOSIS — I872 Venous insufficiency (chronic) (peripheral): Secondary | ICD-10-CM | POA: Diagnosis present

## 2022-12-26 DIAGNOSIS — L97429 Non-pressure chronic ulcer of left heel and midfoot with unspecified severity: Secondary | ICD-10-CM | POA: Diagnosis not present

## 2022-12-26 DIAGNOSIS — E119 Type 2 diabetes mellitus without complications: Secondary | ICD-10-CM

## 2022-12-26 DIAGNOSIS — Z5986 Financial insecurity: Secondary | ICD-10-CM

## 2022-12-26 DIAGNOSIS — K760 Fatty (change of) liver, not elsewhere classified: Secondary | ICD-10-CM | POA: Diagnosis present

## 2022-12-26 DIAGNOSIS — R748 Abnormal levels of other serum enzymes: Secondary | ICD-10-CM | POA: Diagnosis present

## 2022-12-26 DIAGNOSIS — I509 Heart failure, unspecified: Secondary | ICD-10-CM | POA: Diagnosis not present

## 2022-12-26 DIAGNOSIS — Z66 Do not resuscitate: Secondary | ICD-10-CM | POA: Diagnosis not present

## 2022-12-26 DIAGNOSIS — I5023 Acute on chronic systolic (congestive) heart failure: Secondary | ICD-10-CM

## 2022-12-26 DIAGNOSIS — L97909 Non-pressure chronic ulcer of unspecified part of unspecified lower leg with unspecified severity: Secondary | ICD-10-CM | POA: Diagnosis not present

## 2022-12-26 DIAGNOSIS — Z794 Long term (current) use of insulin: Secondary | ICD-10-CM

## 2022-12-26 DIAGNOSIS — Z89421 Acquired absence of other right toe(s): Secondary | ICD-10-CM | POA: Diagnosis not present

## 2022-12-26 DIAGNOSIS — I878 Other specified disorders of veins: Secondary | ICD-10-CM | POA: Diagnosis not present

## 2022-12-26 DIAGNOSIS — Z6835 Body mass index (BMI) 35.0-35.9, adult: Secondary | ICD-10-CM | POA: Diagnosis not present

## 2022-12-26 DIAGNOSIS — E1151 Type 2 diabetes mellitus with diabetic peripheral angiopathy without gangrene: Secondary | ICD-10-CM | POA: Diagnosis present

## 2022-12-26 DIAGNOSIS — E1165 Type 2 diabetes mellitus with hyperglycemia: Secondary | ICD-10-CM | POA: Diagnosis present

## 2022-12-26 DIAGNOSIS — I110 Hypertensive heart disease with heart failure: Secondary | ICD-10-CM | POA: Insufficient documentation

## 2022-12-26 DIAGNOSIS — Z89431 Acquired absence of right foot: Secondary | ICD-10-CM

## 2022-12-26 DIAGNOSIS — E876 Hypokalemia: Secondary | ICD-10-CM | POA: Diagnosis not present

## 2022-12-26 DIAGNOSIS — I428 Other cardiomyopathies: Secondary | ICD-10-CM | POA: Diagnosis present

## 2022-12-26 DIAGNOSIS — Z91148 Patient's other noncompliance with medication regimen for other reason: Secondary | ICD-10-CM | POA: Insufficient documentation

## 2022-12-26 DIAGNOSIS — E8809 Other disorders of plasma-protein metabolism, not elsewhere classified: Secondary | ICD-10-CM | POA: Insufficient documentation

## 2022-12-26 DIAGNOSIS — R531 Weakness: Secondary | ICD-10-CM | POA: Diagnosis not present

## 2022-12-26 DIAGNOSIS — L97919 Non-pressure chronic ulcer of unspecified part of right lower leg with unspecified severity: Secondary | ICD-10-CM | POA: Diagnosis not present

## 2022-12-26 DIAGNOSIS — Z608 Other problems related to social environment: Secondary | ICD-10-CM | POA: Diagnosis present

## 2022-12-26 DIAGNOSIS — Z8249 Family history of ischemic heart disease and other diseases of the circulatory system: Secondary | ICD-10-CM

## 2022-12-26 DIAGNOSIS — Z91199 Patient's noncompliance with other medical treatment and regimen due to unspecified reason: Secondary | ICD-10-CM | POA: Diagnosis not present

## 2022-12-26 DIAGNOSIS — M86672 Other chronic osteomyelitis, left ankle and foot: Secondary | ICD-10-CM | POA: Diagnosis present

## 2022-12-26 DIAGNOSIS — M7989 Other specified soft tissue disorders: Secondary | ICD-10-CM | POA: Diagnosis not present

## 2022-12-26 DIAGNOSIS — E11622 Type 2 diabetes mellitus with other skin ulcer: Secondary | ICD-10-CM | POA: Diagnosis not present

## 2022-12-26 DIAGNOSIS — S81801A Unspecified open wound, right lower leg, initial encounter: Secondary | ICD-10-CM | POA: Diagnosis not present

## 2022-12-26 DIAGNOSIS — Z9049 Acquired absence of other specified parts of digestive tract: Secondary | ICD-10-CM | POA: Diagnosis not present

## 2022-12-26 DIAGNOSIS — Z79899 Other long term (current) drug therapy: Secondary | ICD-10-CM | POA: Insufficient documentation

## 2022-12-26 DIAGNOSIS — E1169 Type 2 diabetes mellitus with other specified complication: Secondary | ICD-10-CM | POA: Insufficient documentation

## 2022-12-26 DIAGNOSIS — I5043 Acute on chronic combined systolic (congestive) and diastolic (congestive) heart failure: Secondary | ICD-10-CM | POA: Diagnosis not present

## 2022-12-26 DIAGNOSIS — M8618 Other acute osteomyelitis, other site: Secondary | ICD-10-CM | POA: Diagnosis not present

## 2022-12-26 DIAGNOSIS — I739 Peripheral vascular disease, unspecified: Secondary | ICD-10-CM | POA: Diagnosis present

## 2022-12-26 DIAGNOSIS — Z560 Unemployment, unspecified: Secondary | ICD-10-CM

## 2022-12-26 DIAGNOSIS — E11621 Type 2 diabetes mellitus with foot ulcer: Secondary | ICD-10-CM | POA: Diagnosis not present

## 2022-12-26 DIAGNOSIS — S81802A Unspecified open wound, left lower leg, initial encounter: Secondary | ICD-10-CM | POA: Diagnosis not present

## 2022-12-26 DIAGNOSIS — I447 Left bundle-branch block, unspecified: Secondary | ICD-10-CM | POA: Diagnosis present

## 2022-12-26 DIAGNOSIS — Z833 Family history of diabetes mellitus: Secondary | ICD-10-CM

## 2022-12-26 DIAGNOSIS — I5021 Acute systolic (congestive) heart failure: Secondary | ICD-10-CM | POA: Diagnosis not present

## 2022-12-26 LAB — CBC
HCT: 44.2 % (ref 39.0–52.0)
HCT: 41.7 % (ref 39.0–52.0)
Hemoglobin: 13.4 g/dL (ref 13.0–17.0)
Hemoglobin: 12.9 g/dL — ABNORMAL LOW (ref 13.0–17.0)
MCH: 26.9 pg (ref 26.0–34.0)
MCH: 27.8 pg (ref 26.0–34.0)
MCHC: 30.3 g/dL (ref 30.0–36.0)
MCHC: 30.9 g/dL (ref 30.0–36.0)
MCV: 88.6 fL (ref 80.0–100.0)
MCV: 89.9 fL (ref 80.0–100.0)
Platelets: 238 10*3/uL (ref 150–400)
Platelets: 247 10*3/uL (ref 150–400)
RBC: 4.99 MIL/uL (ref 4.22–5.81)
RBC: 4.64 MIL/uL (ref 4.22–5.81)
RDW: 15.9 % — ABNORMAL HIGH (ref 11.5–15.5)
RDW: 15.9 % — ABNORMAL HIGH (ref 11.5–15.5)
WBC: 5.8 10*3/uL (ref 4.0–10.5)
WBC: 5.8 10*3/uL (ref 4.0–10.5)
nRBC: 0 % (ref 0.0–0.2)
nRBC: 0 % (ref 0.0–0.2)

## 2022-12-26 LAB — HEMOGLOBIN A1C
Hgb A1c MFr Bld: 11.2 % — ABNORMAL HIGH (ref 4.8–5.6)
Mean Plasma Glucose: 274.74 mg/dL

## 2022-12-26 LAB — BASIC METABOLIC PANEL
Anion gap: 8 (ref 5–15)
BUN: 11 mg/dL (ref 6–20)
CO2: 28 mmol/L (ref 22–32)
Calcium: 7.8 mg/dL — ABNORMAL LOW (ref 8.9–10.3)
Chloride: 96 mmol/L — ABNORMAL LOW (ref 98–111)
Creatinine, Ser: 0.86 mg/dL (ref 0.61–1.24)
GFR, Estimated: >60 mL/min (ref >60)
Glucose, Bld: 201 mg/dL — ABNORMAL HIGH (ref 70–99)
Potassium: 4.7 mmol/L (ref 3.5–5.1)
Sodium: 132 mmol/L — ABNORMAL LOW (ref 135–145)

## 2022-12-26 LAB — GLUCOSE, CAPILLARY
Glucose-Capillary: 192 mg/dL — ABNORMAL HIGH (ref 70–99)
Glucose-Capillary: 224 mg/dL — ABNORMAL HIGH (ref 70–99)

## 2022-12-26 LAB — COMPREHENSIVE METABOLIC PANEL
ALT: 20 U/L (ref 0–44)
AST: 31 U/L (ref 15–41)
Albumin: 1.8 g/dL — ABNORMAL LOW (ref 3.5–5.0)
Alkaline Phosphatase: 208 U/L — ABNORMAL HIGH (ref 38–126)
Anion gap: 11 (ref 5–15)
BUN: 10 mg/dL (ref 6–20)
CO2: 29 mmol/L (ref 22–32)
Calcium: 8.1 mg/dL — ABNORMAL LOW (ref 8.9–10.3)
Chloride: 94 mmol/L — ABNORMAL LOW (ref 98–111)
Creatinine, Ser: 0.92 mg/dL (ref 0.61–1.24)
GFR, Estimated: >60 mL/min (ref >60)
Glucose, Bld: 257 mg/dL — ABNORMAL HIGH (ref 70–99)
Potassium: 4.1 mmol/L (ref 3.5–5.1)
Sodium: 134 mmol/L — ABNORMAL LOW (ref 135–145)
Total Bilirubin: 1 mg/dL (ref 0.3–1.2)
Total Protein: 8.8 g/dL — ABNORMAL HIGH (ref 6.5–8.1)

## 2022-12-26 LAB — MAGNESIUM: Magnesium: 1.1 mg/dL — ABNORMAL LOW (ref 1.7–2.4)

## 2022-12-26 LAB — CBG MONITORING, ED: Glucose-Capillary: 193 mg/dL — ABNORMAL HIGH (ref 70–99)

## 2022-12-26 LAB — BRAIN NATRIURETIC PEPTIDE
B Natriuretic Peptide: 2409.9 pg/mL — ABNORMAL HIGH (ref 0.0–100.0)
B Natriuretic Peptide: 1929.8 pg/mL — ABNORMAL HIGH (ref 0.0–100.0)

## 2022-12-26 LAB — DIGOXIN LEVEL: Digoxin Level: 0.2 ng/mL — ABNORMAL LOW (ref 0.8–2.0)

## 2022-12-26 MED ORDER — INSULIN ASPART 100 UNIT/ML IJ SOLN
0.0000 [IU] | Freq: Three times a day (TID) | INTRAMUSCULAR | Status: DC
  Administered 2022-12-27 (×3): 4 [IU] via SUBCUTANEOUS
  Administered 2022-12-28 (×3): 3 [IU] via SUBCUTANEOUS
  Administered 2022-12-29: 4 [IU] via SUBCUTANEOUS

## 2022-12-26 MED ORDER — POTASSIUM CHLORIDE CRYS ER 20 MEQ PO TBCR
40.0000 meq | EXTENDED_RELEASE_TABLET | Freq: Once | ORAL | Status: AC
  Administered 2022-12-26: 40 meq via ORAL
  Filled 2022-12-26: qty 2

## 2022-12-26 MED ORDER — SODIUM CHLORIDE 0.9% FLUSH
3.0000 mL | Freq: Two times a day (BID) | INTRAVENOUS | Status: DC
  Administered 2022-12-26 – 2022-12-29 (×6): 3 mL via INTRAVENOUS

## 2022-12-26 MED ORDER — FUROSEMIDE 10 MG/ML IJ SOLN
80.0000 mg | Freq: Two times a day (BID) | INTRAMUSCULAR | Status: DC
  Administered 2022-12-26 – 2022-12-28 (×5): 80 mg via INTRAVENOUS
  Filled 2022-12-26 (×6): qty 8

## 2022-12-26 MED ORDER — ONDANSETRON HCL 4 MG/2ML IJ SOLN: 4.0000 mg | Freq: Four times a day (QID) | INTRAMUSCULAR | Status: DC | PRN

## 2022-12-26 MED ORDER — SODIUM CHLORIDE 0.9 % IV SOLN: 250.0000 mL | INTRAVENOUS | Status: DC | PRN

## 2022-12-26 MED ORDER — DIGOXIN 125 MCG PO TABS
0.1250 mg | ORAL_TABLET | Freq: Every day | ORAL | Status: DC
  Administered 2022-12-27 – 2022-12-29 (×3): 0.125 mg via ORAL
  Filled 2022-12-26 (×3): qty 1

## 2022-12-26 MED ORDER — SODIUM CHLORIDE 0.9% FLUSH: 3.0000 mL | INTRAVENOUS | Status: DC | PRN

## 2022-12-26 MED ORDER — INSULIN GLARGINE-YFGN 100 UNIT/ML ~~LOC~~ SOLN
20.0000 [IU] | Freq: Every day | SUBCUTANEOUS | Status: DC
  Administered 2022-12-26 – 2022-12-28 (×3): 20 [IU] via SUBCUTANEOUS
  Filled 2022-12-26 (×3): qty 0.2

## 2022-12-26 MED ORDER — SPIRONOLACTONE 25 MG PO TABS
25.0000 mg | ORAL_TABLET | Freq: Every day | ORAL | Status: DC
  Administered 2022-12-27 – 2022-12-29 (×3): 25 mg via ORAL
  Filled 2022-12-26 (×3): qty 1

## 2022-12-26 MED ORDER — FUROSEMIDE 10 MG/ML IJ SOLN
80.0000 mg | Freq: Once | INTRAMUSCULAR | Status: AC
  Administered 2022-12-26: 80 mg via INTRAVENOUS
  Filled 2022-12-26: qty 8

## 2022-12-26 MED ORDER — HYDROXYZINE HCL 25 MG PO TABS
25.0000 mg | ORAL_TABLET | Freq: Once | ORAL | Status: AC
  Administered 2022-12-26: 25 mg via ORAL
  Filled 2022-12-26: qty 1

## 2022-12-26 MED ORDER — SACUBITRIL-VALSARTAN 49-51 MG PO TABS
1.0000 | ORAL_TABLET | Freq: Two times a day (BID) | ORAL | Status: DC
  Administered 2022-12-26 – 2022-12-28 (×4): 1 via ORAL
  Filled 2022-12-26 (×4): qty 1

## 2022-12-26 MED ORDER — ACETAMINOPHEN 325 MG PO TABS: 650.0000 mg | ORAL_TABLET | ORAL | Status: DC | PRN

## 2022-12-26 MED ORDER — INSULIN GLARGINE-YFGN 100 UNIT/ML ~~LOC~~ SOLN
15.0000 [IU] | Freq: Every day | SUBCUTANEOUS | Status: DC
  Filled 2022-12-26: qty 0.15

## 2022-12-26 MED ORDER — ENOXAPARIN SODIUM 60 MG/0.6ML IJ SOSY
60.0000 mg | PREFILLED_SYRINGE | INTRAMUSCULAR | Status: DC
  Administered 2022-12-26 – 2022-12-28 (×3): 60 mg via SUBCUTANEOUS
  Filled 2022-12-26 (×3): qty 0.6

## 2022-12-26 MED ORDER — MAGNESIUM SULFATE 2 GM/50ML IV SOLN
2.0000 g | Freq: Once | INTRAVENOUS | Status: AC
  Administered 2022-12-26: 2 g via INTRAVENOUS
  Filled 2022-12-26: qty 50

## 2022-12-26 NOTE — Inpatient Diabetes Management (Signed)
Inpatient Diabetes Program Recommendations  AACE/ADA: New Consensus Statement on Inpatient Glycemic Control (2015)  Target Ranges:  Prepandial:   less than 140 mg/dL      Peak postprandial:   less than 180 mg/dL (1-2 hours)      Critically ill patients:  140 - 180 mg/dL   Lab Results  Component Value Date   GLUCAP 193 (H) 12/26/2022   HGBA1C 11.2 (H) 12/26/2022    Review of Glycemic Control  Diabetes history: DM2 Outpatient Diabetes medications: Novolog 70/30 25 units bid ac meals Current orders for Inpatient glycemic control: None yet  Inpatient Diabetes Program Recommendations:   Patient currently in ED. Noted patient was admitted  11/27/21 and DM coordinator spoke with patient. Patient was started on 70/30 insulin during this admission. A1c was 12.4 on this admission and currently 11.2. Will follow while admitted in the hospital.  Thank you, Billy Fischer. Mariene Dickerman, RN, MSN, CDE  Diabetes Coordinator Inpatient Glycemic Control Team Team Pager [phone] (8am-5pm) 12/26/2022 1:38 PM

## 2022-12-26 NOTE — Progress Notes (Signed)
ReDS Vest / Clip - 12/26/22 1100       ReDS Vest / Clip   Station Marker D    Ruler Value 34.5    ReDS Value Range High volume overload    ReDS Actual Value 42

## 2022-12-26 NOTE — Progress Notes (Signed)
Patient ID: Dakota Ramirez, male   DOB: 1971/05/24, 52 y.o.   MRN: [id number]  Patient arrived from ED via stretcher. Has multiple wounds on legs and feet. Patient would not allow me to  unwrap his legs and feet to measure wounds but documentation was done on location of wounds. Order for wound care consult placed.   Lidia Collum, RN

## 2022-12-26 NOTE — H&P (Signed)
Date: 12/26/2022         Patient Name:  Dakota Ramirez MRN: [id number]  DOB: [birth year] Age / Sex: 52 y.o., male   PCP: Patient, No Pcp Per         Medical Service: Internal Medicine Teaching Service         Attending Physician: Dr. Gust Rung, DO    First Contact: Dr. Benito Mccreedy Pager: [phone]  Second Contact: Dr. Marijo Conception Pager: [phone]       After Hours (After 5p/  First Contact Pager: [phone]  weekends / holidays): Second Contact Pager: [phone]   Chief Concern: Heart failure exacerbation  History of Present Illness: 52 year old with chronic systolic heart failure presents from cardiology clinic due to concern for decompensated heart failure.  He has been feeling poorly for the last week.  He presented to cardiology clinic for routine follow-up today and was found to be volume overloaded.  Review of systems positive for dyspnea on exertion and some shortness of breath at rest.  He enjoys mowing the lawn and using the weedeater, but he is unable to do this in his current state.  Acknowledges poor adherence to medications recently, Sherryll Burger is expensive and difficult for him to obtain.  Also drinking a lot because of thirst that he attributes to diabetes.  Tried increasing his Lasix at home without much effect.  Tachycardic but otherwise hemodynamically stable in the ED.  BNP 1929.8.  IV Lasix started.  Review of Systems  Constitutional:  Positive for malaise/fatigue. Negative for chills and fever.  Respiratory:  Positive for shortness of breath.   Cardiovascular:  Positive for orthopnea and leg swelling. Negative for chest pain.   Allergies: No Known Allergies  Past Medical History: Chronic systolic heart failure, nonischemic cardiomyopathy Poorly controlled diabetes PVD  Medications: Carvedilol 6.25 mg twice daily Digoxin 0.125 mg Furosemide 40 mg daily Insulin aspart 15 units twice daily with meals Potassium 20 mEq/day Entresto 100 twice daily Spironolactone  25 daily  Acknowledges poor adherence to medications, signing cost is a barrier  Surgical History: Status post transmetatarsal amputation of right foot Left heart cath and coronary angiography with no ischemic disease Status post cholecystectomy  Family History:  Family history reviewed, noncontributory  Social History:  Lives in a camper behind his father's house.  He spends his days at home.  Likes to mow the lawn and cannot do this now because of his illness.  He drinks the occasional beer.  Does not use tobacco or drugs.  Physical Exam: Blood pressure (!) 124/98, pulse (!) 119, temperature 98.2 F (36.8 C), resp. rate 17, height 5\' 11"  (1.803 m), weight 128.8 kg, SpO2 100 %.  No apparent acute distress Heart rate is tachycardic, rhythm is regular, bilateral radial pulses are strong, bilateral lower extremity capillary refill is brisk, bilateral lower extremity edema Breathing is regular and unlabored, bibasilar crackles Skin is warm and dry, several wounds on bilateral lower extremities Abdomen normal nontender, somewhat distended, dependent edema to the flanks Alert and oriented Irritable, depressed, concordant affect  EKG:  Sinus tachycardia  Labs: Sodium 134  potassium 4.1 CO2 29 BUN/creatinine 10/0.86 Alk phos 290 Albumin 1.8 Total protein 8.8  BNP 1929  WBC 5.8  hemoglobin 12.9 A1c 11.2  Images and other studies: CXR with cardiomegaly  Assessment & Plan:  Dakota Ramirez is a 52 y.o. with poorly controlled diabetes and chronic systolic heart failure referred from cardiology clinic for acute on chronic systolic heart failure.  Principal Problem:   Acute on chronic heart failure (HCC) Active Problems:   Type 2 diabetes mellitus (HCC)   PVD (peripheral vascular disease) (HCC)  Acute decompensated systolic heart failure In setting of medication nonadherence and likely increased fluid intake due to poorly controlled diabetes.  Overtly volume overloaded on  exam with dependent edema up to flanks.  He is hemodynamically stable and appears well-perfused, low concern for low output state.  Kidney function is good.  Will start IV diuretics.  Advanced heart failure team is following. - Digoxin 0.125 mg - Furosemide 80 mg twice daily - Entresto 100 twice daily - Spironolactone 25 mg daily - 1800 mL fluid restriction  Poorly controlled type 2 diabetes with hyperglycemia A1c 11.2.  Likely contributing factor to lower extremity wounds.  On short acting insulin, 15 units twice daily with meals.  Start basal/bolus dosing. - Glargine 20 units nightly - SSI 3 times daily with meals  PVD Lower extremity wounds Status post right lower extremity transmetatarsal amputation.  Has a deep ulcer on the left heel and draining wounds on the left big toe, among others of various stages.  Changes of chronic venous stasis of bilateral lower legs.  Not treating for acute infection at this time.  Will request a WOC consult.  Elevated alkaline phosphatase Differential for volume overload includes liver disease, albumin is markedly low.  PT/INR for evaluation of synthetic function.  Elevated alkaline phosphatase, but this person is status post cholecystectomy per [facility] [place] 11/27/2021.  Interestingly, total protein is elevated at 8.8 giving him a pretty high gamma gap. - Follow-up GGT  Diet: Heart healthy, 1800 mL fluid restriction IVF: None VTE: Enoxaparin Code: DNR/DNI Surrogate: Dakota Ramirez, brother  Admit patient to Inpatient with expected length of stay greater than 2 midnights.  Signed: Marrianne Mood MD 12/26/2022, 7:24 PM  Pager: [phone] After 5pm on weekdays and 1pm on weekends: [phone]

## 2022-12-26 NOTE — ED Notes (Signed)
ED TO INPATIENT HANDOFF REPORT  ED Nurse Name and Phone #: Vernona Rieger [phone]  S Name/Age/Gender Dakota Ramirez 52 y.o. male Room/Bed: 038C/038C  Code Status   Code Status: DNR  Home/SNF/Other Home Patient oriented to: self, place, time, and situation Is this baseline? Yes   Triage Complete: Triage complete  Chief Complaint Acute on chronic heart failure (HCC) [I50.9]  Triage Note Pt arrives via POV from home. Was seen at heart and vascular earlier.  Pt reports sob and swelling to legs and abdomen for the past couple of weeks. Denies cp. Pt AxOx4. NAD during triage. BNP was in the 2,000s.     Allergies No Known Allergies  Level of Care/Admitting Diagnosis ED Disposition     ED Disposition  Admit   Condition  --   Comment  Hospital [facility] [[id number]]  Level of Care: Progressive [102]  Admit to Progressive based on following criteria: CARDIOVASCULAR & THORACIC of moderate stability with acute coronary syndrome symptoms/low risk myocardial infarction/hypertensive urgency/arrhythmias/heart failure potentially compromising stability and stable post cardiovascular intervention patients.  May admit patient to [facility] or [facility] if equivalent level of care is available:: No  Covid Evaluation: Asymptomatic - no recent exposure (last 10 days) testing not required  Diagnosis: Acute on chronic heart failure [facility]) [[id number]]  Admitting Physician: Silvio Pate  Attending Physician: Gust Rung [[id number]]  Certification:: I certify this patient will need inpatient services for at least 2 midnights  Estimated Length of Stay: 2          B Medical/Surgery History Past Medical History:  Diagnosis Date   Diabetes mellitus without complication (HCC)    Hypertension    Past Surgical History:  Procedure Laterality Date   BACK SURGERY     CHOLECYSTECTOMY     RIGHT/LEFT HEART CATH AND CORONARY ANGIOGRAPHY N/A 11/30/2021   Procedure: RIGHT/LEFT  HEART CATH AND CORONARY ANGIOGRAPHY;  Surgeon: Dolores Patty, MD;  Location: [facility] INVASIVE CV LAB;  Service: Cardiovascular;  Laterality: N/A;   TOE AMPUTATION Right    all toes on RT foot     A IV Location/Drains/Wounds Patient Lines/Drains/Airways Status     Active Line/Drains/Airways     Name Placement date Placement time Site Days   Peripheral IV 12/26/22 20 G Left Antecubital 12/26/22  1200  Antecubital  less than 1            Intake/Output Last 24 hours  Intake/Output Summary (Last 24 hours) at 12/26/2022 1803 Last data filed at 12/26/2022 1634 Gross per 24 hour  Intake --  Output 1100 ml  Net -1100 ml    Labs/Imaging Results for orders placed or performed during the hospital encounter of 12/26/22 (from the past 48 hour(s))  CBG monitoring, ED     Status: Abnormal   Collection Time: 12/26/22 12:18 PM  Result Value Ref Range   Glucose-Capillary 193 (H) 70 - 99 mg/dL    Comment: Glucose reference range applies only to samples taken after fasting for at least 8 hours.  Basic metabolic panel     Status: Abnormal   Collection Time: 12/26/22  2:19 PM  Result Value Ref Range   Sodium 132 (L) 135 - 145 mmol/L   Potassium 4.7 3.5 - 5.1 mmol/L    Comment: HEMOLYSIS AT THIS LEVEL MAY AFFECT RESULT   Chloride 96 (L) 98 - 111 mmol/L   CO2 28 22 - 32 mmol/L   Glucose, Bld 201 (H) 70 -  99 mg/dL    Comment: Glucose reference range applies only to samples taken after fasting for at least 8 hours.   BUN 11 6 - 20 mg/dL   Creatinine, Ser 4.09 0.61 - 1.24 mg/dL   Calcium 7.8 (L) 8.9 - 10.3 mg/dL   GFR, Estimated >81 >19 mL/min    Comment: (NOTE) Calculated using the CKD-EPI Creatinine Equation (2021)    Anion gap 8 5 - 15    Comment: Performed at [facility], [address] [place], [place] [address]  CBC     Status: Abnormal   Collection Time: 12/26/22  2:19 PM  Result Value Ref Range   WBC 5.8 4.0 - 10.5 K/uL   RBC 4.64 4.22 - 5.81 MIL/uL   Hemoglobin 12.9  (L) 13.0 - 17.0 g/dL   HCT 95.6 21.3 - 08.6 %   MCV 89.9 80.0 - 100.0 fL   MCH 27.8 26.0 - 34.0 pg   MCHC 30.9 30.0 - 36.0 g/dL   RDW 57.8 (H) 46.9 - 62.9 %   Platelets 247 150 - 400 K/uL   nRBC 0.0 0.0 - 0.2 %    Comment: Performed at [facility], [address] [place], [place] [address]  Brain natriuretic peptide     Status: Abnormal   Collection Time: 12/26/22  2:19 PM  Result Value Ref Range   B Natriuretic Peptide 1,929.8 (H) 0.0 - 100.0 pg/mL    Comment: Performed at [facility], [address], [place], [place] [address]   DG Chest 2 View  Result Date: 12/26/2022 CLINICAL DATA:  Shortness of breath. Do not take insulin this morning. Weakness and lethargy. EXAM: CHEST - 2 VIEW COMPARISON:  11/26/2021 and CT chest 11/26/2021. FINDINGS: Trachea is midline. Heart is enlarged. Lungs are clear. No pleural fluid. Old left rib fractures. IMPRESSION: No acute findings. Electronically Signed   By: Leanna Battles M.D.   On: 12/26/2022 13:14    Pending Labs Unresulted Labs (From admission, onward)     Start     Ordered   12/27/22 0500  HIV Antibody (routine testing w rflx)  (HIV Antibody (Routine testing w reflex) panel)  Tomorrow morning,   R        12/26/22 1722   12/27/22 0500  Basic metabolic panel  Daily,   R     Comments: As Scheduled for 5 days    12/26/22 1722   12/27/22 0500  Magnesium  Tomorrow morning,   R        12/26/22 1724            Vitals/Pain Today's Vitals   12/26/22 1715 12/26/22 1730 12/26/22 1745 12/26/22 1800  BP: 101/67 (!) 116/92 101/75 104/82  Pulse: (!) 114 (!) 116 (!) 119 (!) 109  Resp: 17 16 (!) 21 18  Temp:      TempSrc:      SpO2: 100% 100% 99% 99%  Weight:      Height:      PainSc:        Isolation Precautions No active isolations  Medications Medications  furosemide (LASIX) injection 80 mg (80 mg Intravenous Given 12/26/22 1505)  furosemide (LASIX) injection 80 mg (has no administration in time range)  sodium  chloride flush (NS) 0.9 % injection 3 mL (has no administration in time range)  sodium chloride flush (NS) 0.9 % injection 3 mL (has no administration in time range)  0.9 %  sodium chloride infusion (has no  administration in time range)  acetaminophen (TYLENOL) tablet 650 mg (has no administration in time range)  ondansetron (ZOFRAN) injection 4 mg (has no administration in time range)  enoxaparin (LOVENOX) injection 60 mg (has no administration in time range)  insulin aspart (novoLOG) injection 0-20 Units (has no administration in time range)  insulin glargine-yfgn (SEMGLEE) injection 20 Units (has no administration in time range)  digoxin (LANOXIN) tablet 0.125 mg (has no administration in time range)  sacubitril-valsartan (ENTRESTO) 49-51 mg per tablet (has no administration in time range)  spironolactone (ALDACTONE) tablet 25 mg (has no administration in time range)  potassium chloride SA (KLOR-CON M) CR tablet 40 mEq (40 mEq Oral Given 12/26/22 1504)    Mobility Walks       Focused Assessments Cardiac Assessment Handoff:    Lab Results  Component Value Date   TROPONINI <0.03 05/27/2018   Lab Results  Component Value Date   DDIMER 2.04 (H) 11/26/2021   Does the Patient currently have chest pain? No    R Recommendations: See Admitting Provider Note  Report given to:   Additional Notes:

## 2022-12-26 NOTE — Consult Note (Signed)
Advanced Heart Failure Team Consult Note   Primary Physician: Patient, No Pcp Per PCP-Cardiologist:  Dr. Gala Romney   Reason for Consultation: Acute on Chronic Systolic Heart Failure   HPI:    Dakota Ramirez is seen today for evaluation of acute on chronic systolic heart failure at the request of Dr. Alona Bene, EDP.   Dakota Ramirez is a 52 y.o. 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    Admitted 4/23 with new-onset acute HF.  Given IV lasix. Echo EF 20-25%, regional WMA, moderately dilated LV, RV moderately reduced, RVSP 40 mmHg, mild to moderate MR, dilated IVC with estimated RAP 15 mmHg. Cards consulted and GDMT started. 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  Overall diuresed 50 lbs. No SGLT2i with uncontrolled DM. Discharged home, weight 259 lbs.    Post hospital follow up, NYHA II, volume OK. Entresto increased and paramedicine arranged to help with med compliance.   Clinic visit 9/23 was NYHA II and volume OK. Missed follow up echo. Bedside echo 9/23 showed EF 20-25%.   cMRI 4/24 showed severe biventricular failure, LVEF 17%. RVEF 22% LGE imaging very poor quality, which limits interpretation. 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    He presents today for routine f/u. Says he "feels terrible". SOB walking short distances and SOB at rest. Wt is up significantly since last visit, from 268 lb to 293 lb. BP 134/96. Tachycardic, w/ initial pulse rate of 120 bpm. EKG shows sinus tach 114 bpm w/ incomplete LBBB. ReDs 42%.    He has had difficulty affording meds. Ran out of Entresto. Tried doubling up on Lasix at home w/ minimal improvement in symptoms. Also out of insulin. Hasn't taken any insulin in "several days". Lives in a camper behind father's house. Unemployed and has  limited social support.  Review of Systems: [y] = yes, [ ]  = no   General: Weight gain [Y ]; Weight loss [ ] ; Anorexia [ ] ; Fatigue [ ] ; Fever [ ] ; Chills [ ] ; Weakness [ ]   Cardiac: Chest pain/pressure [ ] ; Resting SOB [ Y]; Exertional SOB [Y ]; Orthopnea [ ] ; Pedal Edema [Y ]; Palpitations [ ] ; Syncope [ ] ; Presyncope [ ] ; Paroxysmal nocturnal dyspnea[ ]   Pulmonary: Cough [ ] ; Wheezing[ ] ; Hemoptysis[ ] ; Sputum [ ] ; Snoring [ ]   GI: Vomiting[ ] ; Dysphagia[ ] ; Melena[ ] ; Hematochezia [ ] ; Heartburn[ ] ; Abdominal pain [ ] ; Constipation [ ] ; Diarrhea [ ] ; BRBPR [ ]   GU: Hematuria[ ] ; Dysuria [ ] ; Nocturia[ ]   Vascular: Pain in legs with walking [ ] ; Pain in feet with lying flat [ ] ; Non-healing sores [ ] ; Stroke [ ] ; TIA [ ] ; Slurred speech [ ] ;  Neuro: Headaches[ ] ; Vertigo[ ] ; Seizures[ ] ; Paresthesias[ ] ;Blurred vision [ ] ; Diplopia [ ] ; Vision changes [ ]   Ortho/Skin: Arthritis [ ] ; Joint pain [ ] ; Muscle pain [ ] ; Joint swelling [ ] ; Back Pain [ ] ; Rash [ ]   Psych: Depression[Y ]; Anxiety[ ]   Heme: Bleeding problems [ ] ; Clotting disorders [ ] ; Anemia [ ]   Endocrine: Diabetes [ Y]; Thyroid dysfunction[ ]   Home Medications Prior to Admission medications   Medication Sig Start Date End Date Taking? Authorizing Provider  Blood Glucose Monitoring Suppl (BLOOD GLUCOSE MONITOR SYSTEM) w/Device KIT Use to check blood sugars  4 times daily. 12/01/21   Arrien, York Ram, MD  carvedilol (COREG) 6.25 MG tablet Take 1 tablet (6.25 mg total) by mouth 2 (two) times daily. 12/12/22   Bensimhon, Bevelyn Buckles, MD  digoxin (LANOXIN) 0.125 MG tablet Take 1 tablet (0.125 mg total) by mouth daily. 12/12/22   Bensimhon, Bevelyn Buckles, MD  furosemide (LASIX) 40 MG tablet Take 1 tablet (40 mg total) by mouth daily. 12/12/22   Bensimhon, Bevelyn Buckles, MD  insulin aspart protamine - aspart (NOVOLOG 70/30 MIX) (70-30) 100 UNIT/ML FlexPen Inject 15 Units into the skin 2 (two) times daily. Patient taking differently: Inject 25  Units into the skin 2 (two) times daily with a meal. 12/01/21 01/12/23  Arrien, York Ram, MD  potassium chloride SA (KLOR-CON M) 20 MEQ tablet Take 1 tablet (20 mEq total) by mouth 2 (two) times daily. 12/12/22   Bensimhon, Bevelyn Buckles, MD  sacubitril-valsartan (ENTRESTO) 49-51 MG Take 1 tablet by mouth 2 (two) times daily. 12/12/22   Bensimhon, Bevelyn Buckles, MD  spironolactone (ALDACTONE) 25 MG tablet Take 1 tablet (25 mg total) by mouth daily. 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    Family History: Family History  Problem Relation Age of Onset   Diabetes Mother    Hypertension Mother     Social History: Social History   Socioeconomic History   Marital status: Single    Spouse name: Not on file   Number of children: Not on file   Years of education: Not on file   Highest education level: Not on file  Occupational History   Not on file  Tobacco Use   Smoking status: Never   Smokeless tobacco: Never  Vaping Use   Vaping Use: Never used  Substance and Sexual Activity   Alcohol use: Never   Drug use: Never   Sexual activity: Not on file  Other Topics Concern   Not on file  Social History Narrative   Not on file   Social Determinants of Health   Financial Resource Strain: High Risk (11/29/2021)   Overall Financial Resource Strain (CARDIA)    Difficulty of Paying Living Expenses: Hard  Food Insecurity: No Food Insecurity (11/29/2021)   Hunger Vital Sign     Worried About Running Out of Food in the Last Year: Never true    Ran Out of Food in the Last Year: Never true  Transportation Needs: No Transportation Needs (11/29/2021)   PRAPARE - Administrator, Civil Service (Medical): No    Lack of Transportation (Non-Medical): No  Physical Activity: Not on file  Stress: Not on file  Social Connections: Not on file    Allergies:  No Known Allergies  Objective:    Vital Signs:   Temp:  [97.7 F (36.5 C)] 97.7 F (36.5 C) (05/15 1203) Pulse Rate:  [112-113] 113 (05/15 1400) Resp:  [17-18] 18 (05/15 1400) BP: (111-126)/(86-96) 111/86 (05/15 1400) SpO2:  [  98 %-99 %] 99 % (05/15 1400)    Weight change: There were no vitals filed for this visit.  Intake/Output:  No intake or output data in the 24 hours ending 12/26/22 1447    Physical Exam    ReDs 42%  General:  SOB ambulating from waiting room to exam room.  No respiratory difficulty at rest  HEENT: normal Neck: supple. JVD elevated to jaw Carotids 2+ bilat; no bruits. No lymphadenopathy or thyromegaly appreciated. Cor: PMI nondisplaced. Regular rhythm and tachy rate. No rubs, gallops or murmurs. Lungs: decreased BS at the bases w/ faint bibasilar crackles  Abdomen: obese and distended w/ abdominal wall edema nontender. No hepatosplenomegaly. No bruits or masses. Good bowel sounds. Extremities: no cyanosis, clubbing, rash, 2+ b/l LE edema up to thighs  Neuro: alert & oriented x 3, cranial nerves grossly intact. moves all 4 extremities w/o difficulty. Affect pleasant.   Telemetry   Sinus tach 110s   EKG    Sinus tach 114 bpm w/ incomplete LBBB   Labs   Basic Metabolic Panel: Recent Labs  Lab 12/26/22 1132  NA 134*  K 4.1  CL 94*  CO2 29  GLUCOSE 257*  BUN 10  CREATININE 0.92  CALCIUM 8.1*    Liver Function Tests: Recent Labs  Lab 12/26/22 1132  AST 31  ALT 20  ALKPHOS 208*  BILITOT 1.0  PROT 8.8*  ALBUMIN 1.8*   No results for input(s):  "LIPASE", "AMYLASE" in the last 168 hours. No results for input(s): "AMMONIA" in the last 168 hours.  CBC: Recent Labs  Lab 12/26/22 1132  WBC 5.8  HGB 13.4  HCT 44.2  MCV 88.6  PLT 238    Cardiac Enzymes: No results for input(s): "CKTOTAL", "CKMB", "CKMBINDEX", "TROPONINI" in the last 168 hours.  BNP: BNP (last 3 results) Recent Labs    08/14/22 1457 09/04/22 1350 12/26/22 1132  BNP 516.5* 622.6* 2,409.9*    ProBNP (last 3 results) No results for input(s): "PROBNP" in the last 8760 hours.   CBG: Recent Labs  Lab 12/26/22 1218  GLUCAP 193*    Coagulation Studies: No results for input(s): "LABPROT", "INR" in the last 72 hours.   Imaging   DG Chest 2 View  Result Date: 12/26/2022 CLINICAL DATA:  Shortness of breath. Do not take insulin this morning. Weakness and lethargy. EXAM: CHEST - 2 VIEW COMPARISON:  11/26/2021 and CT chest 11/26/2021. FINDINGS: Trachea is midline. Heart is enlarged. Lungs are clear. No pleural fluid. Old left rib fractures. IMPRESSION: No acute findings. Electronically Signed   By: Leanna Battles M.D.   On: 12/26/2022 13:14     Medications:     Current Medications:  furosemide  80 mg Intravenous BID   furosemide  80 mg Intravenous Once   potassium chloride  40 mEq Oral Once    Infusions:     Patient Profile   52 y/o male w/ h/o 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   Assessment/Plan   Acute on Chronic Systolic Heart Failure - NICM. Reports hx CHF in his mother. No hx alcohol or drug abuse. - Echo (4/23): EF 20-25%, regional WMA, moderately dilated LV, RV moderately reduced, RVSP 40 mmHg, mild to moderate MR, dilated IVC with estimated RAP 15 mmHg - R/LHC (4/23): showed normal cors, severe NICM EF 20-25% and mildly elevated  filling pressures w/  normal CO - cMRi 4/24: showed severe biventricular failure, LVEF 17%. RVEF 22% LGE imaging very poor quality, which limits interpretation. Appears to have RV insertion site LGE (associated with worse prognosis) - NYHA IIIb. Volume overloaded on exam, in setting of poor med compliance/reported inability to afford meds. Minimal improvement in up titration of home Lasix regimen. He is up ~40 lb from previous discharge wt. ReDs 42%  - recommend admission for IV diuretics, would start w/ Lasix 80 mg bid  - Check CMP and BNP  - given severe biventricular failure, may need inotropic support if difficulties diuresing  - restart Entresto 49/51 mg bid. - Continue digoxin 0.125 mg daily. Check Dig level today  - Continue spiro 25 mg daily. - Continue carvedilol 6.25 mg bid. Hold dose up titration until fully diuresed  - No SGLT2i with uncontrolled DM and concern for hygiene. - Daily wts, strict I/Os, low sodium/carb modified diet w/ fluid restriction < 1800 ml    2. Uncontrolled DM2: - last A1c on file was 12.4%. - reports he has been off insulin for several days, unable to afford refills  - repeat Hgb A1c  - will ask TRH to manage    3. Hx osteomyelitis s/p right TMA: - 2/2 poorly controlled DM    4. HTN: - elevated, in setting of missed meds + volume overload - GDMT + diuresis per above    5. Hypoalbuminemia: - Liver US with echogenic parenchyma, ? Hepatic steatosis or fibrosis. Cannot exclude cirrhosis. - hepC antibody NR  -? Secondary to RV failure - check CMP    6. SDOH:  - Now insured. - Lives in a camper behind father's house. Unemployed and has limited social support. - Needs Paramedicine but he has refulsed, HFSW helping.    Needs admission for IV diuresis. Given other multiple medical problems including poorly controlled DM and LE wounds, will need to be admitted under triad hospitalist. Will send to the ED for admission. AHF team will serve as consultants. Pt was also seen and  examined by Dr. Gasper Lloyd who agrees w/ the above recommendations.    Length of Stay: 0  Robbie Lis, PA-C  12/26/2022, 2:47 PM  Advanced Heart Failure Team Pager [phone] (M-F; 7a - 5p)  Please contact [facility] for night-coverage after hours (4p -7a ) and weekends on [email or web address]

## 2022-12-26 NOTE — ED Triage Notes (Addendum)
Pt arrives via POV from home. Was seen at heart and vascular earlier.  Pt reports sob and swelling to legs and abdomen for the past couple of weeks. Denies cp. Pt AxOx4. NAD during triage. BNP was in the 2,000s.

## 2022-12-26 NOTE — ED Notes (Signed)
ED TO INPATIENT HANDOFF REPORT  ED Nurse Name and Phone #: Vernona Rieger [phone]  S Name/Age/Gender Dakota Ramirez 52 y.o. male Room/Bed: 038C/038C  Code Status   Code Status: Prior  Home/SNF/Other Home Patient oriented to: self, place, time, and situation Is this baseline? Yes   Triage Complete: Triage complete  Chief Complaint Uncontrolled Diabetes  Triage Note Pt arrives via POV from home. Was seen at heart and vascular earlier.  Pt reports sob and swelling to legs and abdomen for the past couple of weeks. Denies cp. Pt AxOx4. NAD during triage. BNP was in the 2,000s.     Allergies No Known Allergies  Level of Care/Admitting Diagnosis ED Disposition     None       B Medical/Surgery History Past Medical History:  Diagnosis Date   Diabetes mellitus without complication (HCC)    Hypertension    Past Surgical History:  Procedure Laterality Date   BACK SURGERY     CHOLECYSTECTOMY     RIGHT/LEFT HEART CATH AND CORONARY ANGIOGRAPHY N/A 11/30/2021   Procedure: RIGHT/LEFT HEART CATH AND CORONARY ANGIOGRAPHY;  Surgeon: Dolores Patty, MD;  Location: [facility];  Service: Cardiovascular;  Laterality: N/A;   TOE AMPUTATION Right    all toes on RT foot     A IV Location/Drains/Wounds Patient Lines/Drains/Airways Status     Active Line/Drains/Airways     Name Placement date Placement time Site Days   Peripheral IV 12/26/22 20 G Left Antecubital 12/26/22  1200  Antecubital  less than 1            Intake/Output Last 24 hours No intake or output data in the 24 hours ending 12/26/22 1558  Labs/Imaging Results for orders placed or performed during the hospital encounter of 12/26/22 (from the past 48 hour(s))  CBG monitoring, ED     Status: Abnormal   Collection Time: 12/26/22 12:18 PM  Result Value Ref Range   Glucose-Capillary 193 (H) 70 - 99 mg/dL    Comment: Glucose reference range applies only to samples taken after fasting for at least 8 hours.   Basic metabolic panel     Status: Abnormal   Collection Time: 12/26/22  2:19 PM  Result Value Ref Range   Sodium 132 (L) 135 - 145 mmol/L   Potassium 4.7 3.5 - 5.1 mmol/L    Comment: HEMOLYSIS AT THIS LEVEL MAY AFFECT RESULT   Chloride 96 (L) 98 - 111 mmol/L   CO2 28 22 - 32 mmol/L   Glucose, Bld 201 (H) 70 - 99 mg/dL    Comment: Glucose reference range applies only to samples taken after fasting for at least 8 hours.   BUN 11 6 - 20 mg/dL   Creatinine, Ser 9.60 0.61 - 1.24 mg/dL   Calcium 7.8 (L) 8.9 - 10.3 mg/dL   GFR, Estimated >45 >40 mL/min    Comment: (NOTE) Calculated using the CKD-EPI Creatinine Equation (2021)    Anion gap 8 5 - 15    Comment: Performed at [facility], [address] [place], [place] [address]  CBC     Status: Abnormal   Collection Time: 12/26/22  2:19 PM  Result Value Ref Range   WBC 5.8 4.0 - 10.5 K/uL   RBC 4.64 4.22 - 5.81 MIL/uL   Hemoglobin 12.9 (L) 13.0 - 17.0 g/dL   HCT 14.7 82.9 - 56.2 %   MCV 89.9 80.0 - 100.0 fL   MCH 27.8 26.0 - 34.0 pg  MCHC 30.9 30.0 - 36.0 g/dL   RDW 62.1 (H) 30.8 - 65.7 %   Platelets 247 150 - 400 K/uL   nRBC 0.0 0.0 - 0.2 %    Comment: Performed at [facility], [address] [place], [place] [address]  Brain natriuretic peptide     Status: Abnormal   Collection Time: 12/26/22  2:19 PM  Result Value Ref Range   B Natriuretic Peptide 1,929.8 (H) 0.0 - 100.0 pg/mL    Comment: Performed at [facility], [address] [place], [place] [address]   DG Chest 2 View  Result Date: 12/26/2022 CLINICAL DATA:  Shortness of breath. Do not take insulin this morning. Weakness and lethargy. EXAM: CHEST - 2 VIEW COMPARISON:  11/26/2021 and CT chest 11/26/2021. FINDINGS: Trachea is midline. Heart is enlarged. Lungs are clear. No pleural fluid. Old left rib fractures. IMPRESSION: No acute findings. Electronically Signed   By: Leanna Battles M.D.   On: 12/26/2022 13:14    Pending Labs Unresulted Labs (From  admission, onward)    None       Vitals/Pain Today's Vitals   12/26/22 1400 12/26/22 1515 12/26/22 1530 12/26/22 1545  BP: 111/86 112/87 (!) 125/98 (!) 124/101  Pulse: (!) 113 (!) 118 (!) 111 (!) 118  Resp: 18 11 (!) 32 17  Temp:      TempSrc:      SpO2: 99% 100% 100% 100%  PainSc:        Isolation Precautions No active isolations  Medications Medications  furosemide (LASIX) injection 80 mg (80 mg Intravenous Given 12/26/22 1505)  furosemide (LASIX) injection 80 mg (has no administration in time range)  potassium chloride SA (KLOR-CON M) CR tablet 40 mEq (40 mEq Oral Given 12/26/22 1504)    Mobility walks     Focused Assessments Cardiac Assessment Handoff:    Lab Results  Component Value Date   TROPONINI <0.03 05/27/2018   Lab Results  Component Value Date   DDIMER 2.04 (H) 11/26/2021   Does the Patient currently have chest pain? Yes    R Recommendations: See Admitting Provider Note  Report given to:   Additional Notes: A&O x4  Chest pain ongoing from admission. MD aware.

## 2022-12-26 NOTE — Progress Notes (Signed)
Patient ID: HUXLEY ENGMAN, male   DOB: 05-Sep-1970, 52 y.o.   MRN: [id number] Patient was red and yellow mews in ED. MD aware per ED RN report.   12/26/22 1858  Assess: MEWS Score  Pulse Rate (!) 119  ECG Heart Rate (!) 117  Resp 17  Level of Consciousness Alert  SpO2 100 %  Assess: MEWS Score  MEWS Temp 0  MEWS Systolic 0  MEWS Pulse 2  MEWS RR 0  MEWS LOC 0  MEWS Score 2  MEWS Score Color Yellow  Assess: if the MEWS score is Yellow or Red  Were vital signs taken at a resting state? Yes  Focused Assessment No change from prior assessment  Does the patient meet 2 or more of the SIRS criteria? No  MEWS guidelines implemented  Yes, yellow  Treat  MEWS Interventions Considered administering scheduled or prn medications/treatments as ordered  Take Vital Signs  Increase Vital Sign Frequency  Yellow: Q2hr x1, continue Q4hrs until patient remains green for 12hrs  Escalate  MEWS: Escalate Yellow: Discuss with charge nurse and consider notifying provider and/or RRT  Notify: Charge Nurse/RN  Name of Charge Nurse/RN Notified Engineer, manufacturing Name/Title MD aware  Assess: SIRS CRITERIA  SIRS Temperature  0  SIRS Pulse 1  SIRS Respirations  0  SIRS WBC 0  SIRS Score Sum  1   Lidia Collum, RN

## 2022-12-26 NOTE — ED Provider Notes (Signed)
[facility]ERGENC[facility] DEPARTMENT AT [facility] Provider Note   CSN: [id number] Arrival date & time: 12/26/22  1138     History  Chief Complaint  Patient presents with   Shortness of Breath   Leg Swelling    Dakota Ramirez is a 52 y.o. male with a PMHx of CHF, DM, HTN, who presents to the ED with concerns for shortness of breath onset several weeks. Notes that he has missed some doses of his diuretics.  Patient has associated bilateral lower extremity swelling.  Patient was evaluated at the heart clinic today for his symptoms.  Instructed to come to the emergency department for further evaluation of his symptoms.  Denies chest pain.   Per patient chart review: Patient was evaluated at the heart failure clinic today.  Had a BNP that was elevated greater than 2000's.  At that time his cardiology PA-C recommended admission for IV diuretics.  Cardiology PA-C ordered diuretics as well as potassium.  The history is provided by the patient. No language interpreter was used.       Home Medications Prior to Admission medications   Medication Sig Start Date End Date Taking? Authorizing Provider  Blood Glucose Monitoring Suppl (BLOOD GLUCOSE MONITOR SYSTEM) w/Device KIT Use to check blood sugars 4 times daily. 12/01/21   Arrien, York Ram, MD  carvedilol (COREG) 6.25 MG tablet Take 1 tablet (6.25 mg total) by mouth 2 (two) times daily. 12/12/22   Bensimhon, Bevelyn Buckles, MD  digoxin (LANOXIN) 0.125 MG tablet Take 1 tablet (0.125 mg total) by mouth daily. 12/12/22   Bensimhon, Bevelyn Buckles, MD  furosemide (LASIX) 40 MG tablet Take 1 tablet (40 mg total) by mouth daily. 12/12/22   Bensimhon, Bevelyn Buckles, MD  insulin aspart protamine - aspart (NOVOLOG 70/30 MIX) (70-30) 100 UNIT/ML FlexPen Inject 15 Units into the skin 2 (two) times daily. Patient taking differently: Inject 25 Units into the skin 2 (two) times daily with a meal. 12/01/21 01/12/23  Arrien, York Ram, MD  potassium chloride SA  (KLOR-CON M) 20 MEQ tablet Take 1 tablet (20 mEq total) by mouth 2 (two) times daily. 12/12/22   Bensimhon, Bevelyn Buckles, MD  sacubitril-valsartan (ENTRESTO) 49-51 MG Take 1 tablet by mouth 2 (two) times daily. 12/12/22   Bensimhon, Bevelyn Buckles, MD  spironolactone (ALDACTONE) 25 MG tablet Take 1 tablet (25 mg total) by mouth daily. 12/12/22   Bensimhon, Bevelyn Buckles, MD      Allergies    Patient has no known allergies.    Review of Systems   Review of Systems  Respiratory:  Positive for shortness of breath.   All other systems reviewed and are negative.   Physical Exam Updated Vital Signs BP (!) 126/96 (BP Location: Right Arm)   Pulse (!) 112   Temp 97.7 F (36.5 C) (Oral)   Resp 17   SpO2 98%  Physical Exam Vitals and nursing note reviewed.  Constitutional:      General: He is not in acute distress.    Appearance: He is not diaphoretic.  HENT:     Head: Normocephalic and atraumatic.     Mouth/Throat:     Pharynx: No oropharyngeal exudate.  Eyes:     General: No scleral icterus.    Conjunctiva/sclera: Conjunctivae normal.  Cardiovascular:     Rate and Rhythm: Normal rate and regular rhythm.     Pulses: Normal pulses.     Heart sounds: Normal heart sounds.  Pulmonary:     Effort:  Pulmonary effort is normal. No respiratory distress.     Breath sounds: Normal breath sounds. No wheezing.  Abdominal:     General: Bowel sounds are normal.     Palpations: Abdomen is soft. There is no mass.     Tenderness: There is no abdominal tenderness. There is no guarding or rebound.  Musculoskeletal:        General: Normal range of motion.     Cervical back: Normal range of motion and neck supple.  Skin:    General: Skin is warm and dry.  Neurological:     Mental Status: He is alert.  Psychiatric:        Behavior: Behavior normal.     ED Results / Procedures / Treatments   Labs (all labs ordered are listed, but only abnormal results are displayed) Labs Reviewed  BASIC METABOLIC PANEL -  Abnormal; Notable for the following components:      Result Value   Sodium 132 (*)    Chloride 96 (*)    Glucose, Bld 201 (*)    Calcium 7.8 (*)    All other components within normal limits  CBC - Abnormal; Notable for the following components:   Hemoglobin 12.9 (*)    RDW 15.9 (*)    All other components within normal limits  BRAIN NATRIURETIC PEPTIDE - Abnormal; Notable for the following components:   B Natriuretic Peptide 1,929.8 (*)    All other components within normal limits  CBG MONITORING, ED - Abnormal; Notable for the following components:   Glucose-Capillary 193 (*)    All other components within normal limits  HIV ANTIBODY (ROUTINE TESTING W REFLEX)  BASIC METABOLIC PANEL  MAGNESIUM    EKG None  Radiology DG Chest 2 View  Result Date: 12/26/2022 CLINICAL DATA:  Shortness of breath. Do not take insulin this morning. Weakness and lethargy. EXAM: CHEST - 2 VIEW COMPARISON:  11/26/2021 and CT chest 11/26/2021. FINDINGS: Trachea is midline. Heart is enlarged. Lungs are clear. No pleural fluid. Old left rib fractures. IMPRESSION: No acute findings. Electronically Signed   By: Leanna Battles M.D.   On: 12/26/2022 13:14    Procedures Procedures    Medications Ordered in ED Medications  furosemide (LASIX) injection 80 mg (80 mg Intravenous Given 12/26/22 1505)  furosemide (LASIX) injection 80 mg (has no administration in time range)  sodium chloride flush (NS) 0.9 % injection 3 mL (has no administration in time range)  sodium chloride flush (NS) 0.9 % injection 3 mL (has no administration in time range)  0.9 %  sodium chloride infusion (has no administration in time range)  acetaminophen (TYLENOL) tablet 650 mg (has no administration in time range)  ondansetron (ZOFRAN) injection 4 mg (has no administration in time range)  enoxaparin (LOVENOX) injection 60 mg (has no administration in time range)  insulin aspart (novoLOG) injection 0-20 Units (has no administration in  time range)  insulin glargine-yfgn (SEMGLEE) injection 20 Units (has no administration in time range)  potassium chloride SA (KLOR-CON M) CR tablet 40 mEq (40 mEq Oral Given 12/26/22 1504)    ED Course/ Medical Decision Making/ A&P Clinical Course as of 12/26/22 1727  Wed Dec 26, 2022  1557 Discussed with patient lab and imaging findings.  Discussed with patient plans for admission.  Patient agreeable with admission at this time.  Patient appears safe for admission at this time. [SB]  1613 B Natriuretic Peptide(!): 1,929.8 [SB]  1714 Consult with IMTS, Dr. Marijo Conception who will evaluate the  patient for admission. [SB]    Clinical Course User Index [SB] Bedie Dominey A, PA-C                             Medical Decision Making Amount and/or Complexity of Data Reviewed Labs: ordered. Radiology: ordered.   Patient presents to the ED with a PMHx of CHF, DM, HTN, complaining of shortness of breath onset several weeks. Hasn't been compliant with his medications.  Vital signs, pt afebrile. No hypoxia noted. Slightly tachycardic. On exam patient with pitting edema noted to BLE and pitting edema noted to the level of the mid abdomen. Pt able to speak in clear complete sentences. Recent echocardiogram on 11/27/2021 showed EF of 20-25%. Pt doesn't wear oxygen at baseline. Differential diagnosis includes CHF exacerbation, PE, PNA, or sepsis.   Additional history obtained:  External records from outside source obtained and reviewed including: Patient was evaluated at the heart failure clinic today.  Had a BNP that was elevated greater than 2000's.  At that time his cardiology PA-C recommended admission for IV diuretics.  Cardiology PA-C ordered diuretics as well as potassium.  Labs:  I ordered, and personally interpreted labs.  The pertinent results include:  BNP elevated at 1,929.8 CBC unremarkable BMP overall unremarkable  Imaging: I ordered imaging studies including CXR I independently visualized  and interpreted imaging which showed no acute findings I agree with the radiologist interpretation  Medications:  Cardiology PA-C ordered medication including 80 mg lasix and potassium for diuresis I have reviewed the patients home medicines and have made adjustments as needed  Consultations: I requested consultation with the IMTS, Dr. Marijo Conception, and discussed lab and imaging findings as well as pertinent plan - they recommend: will evaluate for admission  Disposition: Presentation suspicious for likely CHF exacerbation.  Low suspicion at this time for pneumonia.  Considered PE, however given patient appearing fluid overloaded, this seems less likely as CHF exacerbation. Concern for CHF exacerbation and further evaluations recommended. Patient reevaluated prior to consult and vital signs stable, in no acute distress, patient resting comfortably on stretcher. After consideration of the diagnostic results and the patients response to treatment, I feel that the patient would benefit from Admission to the hospital. Discussed admission plans with patient at bedside, patient agreeable at this time. Pt appears safe for admission.   This chart was dictated using voice recognition software, Dragon. Despite the best efforts of this provider to proofread and correct errors, errors may still occur which can change documentation meaning.  Final Clinical Impression(s) / ED Diagnoses Final diagnoses:  Acute on chronic congestive heart failure, unspecified heart failure type [facility])    Rx / DC Orders ED Discharge Orders     None         Seniyah Esker A, PA-C 12/26/22 1727    Maia Plan, MD 12/27/22 1027

## 2022-12-27 ENCOUNTER — Inpatient Hospital Stay (HOSPITAL_COMMUNITY): Payer: 59

## 2022-12-27 ENCOUNTER — Other Ambulatory Visit (HOSPITAL_COMMUNITY): Payer: Self-pay

## 2022-12-27 ENCOUNTER — Telehealth (HOSPITAL_COMMUNITY): Payer: Self-pay | Admitting: Pharmacy Technician

## 2022-12-27 DIAGNOSIS — S81802A Unspecified open wound, left lower leg, initial encounter: Secondary | ICD-10-CM

## 2022-12-27 DIAGNOSIS — I5021 Acute systolic (congestive) heart failure: Secondary | ICD-10-CM

## 2022-12-27 DIAGNOSIS — R748 Abnormal levels of other serum enzymes: Secondary | ICD-10-CM | POA: Diagnosis present

## 2022-12-27 DIAGNOSIS — S81801A Unspecified open wound, right lower leg, initial encounter: Secondary | ICD-10-CM

## 2022-12-27 DIAGNOSIS — I5043 Acute on chronic combined systolic (congestive) and diastolic (congestive) heart failure: Secondary | ICD-10-CM | POA: Diagnosis not present

## 2022-12-27 DIAGNOSIS — E11628 Type 2 diabetes mellitus with other skin complications: Secondary | ICD-10-CM

## 2022-12-27 LAB — HIV ANTIBODY (ROUTINE TESTING W REFLEX): HIV Screen 4th Generation wRfx: NONREACTIVE

## 2022-12-27 LAB — BASIC METABOLIC PANEL
Anion gap: 7 (ref 5–15)
BUN: 12 mg/dL (ref 6–20)
CO2: 31 mmol/L (ref 22–32)
Calcium: 7.7 mg/dL — ABNORMAL LOW (ref 8.9–10.3)
Chloride: 94 mmol/L — ABNORMAL LOW (ref 98–111)
Creatinine, Ser: 1.01 mg/dL (ref 0.61–1.24)
GFR, Estimated: >60 mL/min (ref >60)
Glucose, Bld: 188 mg/dL — ABNORMAL HIGH (ref 70–99)
Potassium: 4 mmol/L (ref 3.5–5.1)
Sodium: 132 mmol/L — ABNORMAL LOW (ref 135–145)

## 2022-12-27 LAB — ECHOCARDIOGRAM COMPLETE
AR max vel: 2.36 cm2
AV Area VTI: 2.64 cm2
AV Area mean vel: 2.3 cm2
AV Mean grad: 2 mmHg
AV Peak grad: 3.4 mmHg
Ao pk vel: 0.92 m/s
Area-P 1/2: 6.9 cm2
Calc EF: 18.9 %
Est EF: <20
Height: 71 in
MV M vel: 4.73 m/s
MV Peak grad: 89.5 mmHg
MV VTI: 1.39 cm2
Radius: 0.6 cm
S' Lateral: 6 cm
Single Plane A2C EF: 19.6 %
Single Plane A4C EF: 19 %
Weight: 4454.4 oz

## 2022-12-27 LAB — MRSA NEXT GEN BY PCR, NASAL: MRSA by PCR Next Gen: DETECTED — AB

## 2022-12-27 LAB — GLUCOSE, CAPILLARY
Glucose-Capillary: 170 mg/dL — ABNORMAL HIGH (ref 70–99)
Glucose-Capillary: 193 mg/dL — ABNORMAL HIGH (ref 70–99)
Glucose-Capillary: 151 mg/dL — ABNORMAL HIGH (ref 70–99)
Glucose-Capillary: 142 mg/dL — ABNORMAL HIGH (ref 70–99)

## 2022-12-27 LAB — MAGNESIUM: Magnesium: 1.2 mg/dL — ABNORMAL LOW (ref 1.7–2.4)

## 2022-12-27 LAB — GAMMA GT: GGT: 96 U/L — ABNORMAL HIGH (ref 7–50)

## 2022-12-27 LAB — PROTIME-INR
INR: 1.3 — ABNORMAL HIGH (ref 0.8–1.2)
Prothrombin Time: 16.2 seconds — ABNORMAL HIGH (ref 11.4–15.2)

## 2022-12-27 MED ORDER — POTASSIUM CHLORIDE CRYS ER 20 MEQ PO TBCR
40.0000 meq | EXTENDED_RELEASE_TABLET | Freq: Once | ORAL | Status: AC
  Administered 2022-12-27: 40 meq via ORAL
  Filled 2022-12-27: qty 2

## 2022-12-27 MED ORDER — PERFLUTREN LIPID MICROSPHERE
1.0000 mL | INTRAVENOUS | Status: AC | PRN
  Administered 2022-12-27: 3 mL via INTRAVENOUS

## 2022-12-27 MED ORDER — CHLORHEXIDINE GLUCONATE CLOTH 2 % EX PADS
6.0000 | MEDICATED_PAD | Freq: Every day | CUTANEOUS | Status: DC
  Administered 2022-12-27 – 2022-12-28 (×2): 6 via TOPICAL

## 2022-12-27 MED ORDER — MAGNESIUM SULFATE 4 GM/100ML IV SOLN
4.0000 g | Freq: Once | INTRAVENOUS | Status: AC
  Administered 2022-12-27: 4 g via INTRAVENOUS
  Filled 2022-12-27: qty 100

## 2022-12-27 MED ORDER — MUPIROCIN 2 % EX OINT
1.0000 | TOPICAL_OINTMENT | Freq: Two times a day (BID) | CUTANEOUS | Status: DC
  Administered 2022-12-27 – 2022-12-28 (×4): 1 via NASAL
  Filled 2022-12-27: qty 22

## 2022-12-27 MED ORDER — CAMPHOR-MENTHOL 0.5-0.5 % EX LOTN
TOPICAL_LOTION | CUTANEOUS | Status: DC | PRN
  Filled 2022-12-27: qty 222

## 2022-12-27 MED ORDER — ACETAZOLAMIDE SODIUM 500 MG IJ SOLR
500.0000 mg | Freq: Once | INTRAMUSCULAR | Status: AC
  Administered 2022-12-27: 500 mg via INTRAVENOUS
  Filled 2022-12-27: qty 500

## 2022-12-27 MED ORDER — MELATONIN 3 MG PO TABS
3.0000 mg | ORAL_TABLET | Freq: Every day | ORAL | Status: DC
  Administered 2022-12-27 – 2022-12-28 (×3): 3 mg via ORAL
  Filled 2022-12-27 (×3): qty 1

## 2022-12-27 MED ORDER — LIVING WELL WITH DIABETES BOOK
Freq: Once | Status: AC
  Filled 2022-12-27: qty 1

## 2022-12-27 NOTE — Progress Notes (Signed)
OT Cancellation Note  Patient Details Name: Dakota Ramirez MRN: [id number] DOB: Sep 07, 1970   Cancelled Treatment:    Reason Eval/Treat Not Completed: Patient declined, no reason specified (pt declined therapy until receiving clothes from family. OT will f/u with pt as able)  12/27/2022  AB, OTR/L  Acute Rehabilitation Services  Office: [phone]   Tristan Schroeder 12/27/2022, 12:14 PM

## 2022-12-27 NOTE — Inpatient Diabetes Management (Addendum)
Inpatient Diabetes Program Recommendations  AACE/ADA: New Consensus Statement on Inpatient Glycemic Control  Target Ranges:  Prepandial:   less than 140 mg/dL      Peak postprandial:   less than 180 mg/dL (1-2 hours)      Critically ill patients:  140 - 180 mg/dL    Latest Reference Range & Units 12/26/22 12:18 12/26/22 18:29 12/26/22 20:55 12/27/22 05:55  Glucose-Capillary 70 - 99 mg/dL 161 (H) 096 (H) 045 (H) 170 (H)    Latest Reference Range & Units 11/26/21 17:32 12/26/22 11:32  Hemoglobin A1C 4.8 - 5.6 % 12.4 (H) 11.2 (H)   Review of Glycemic Control  Diabetes history: DM2 Outpatient Diabetes medications: 70/30 25 units BID Current orders for Inpatient glycemic control: Semglee 20 units QHS, Novolog 0-20 units TID with meals  Inpatient Diabetes Program Recommendations:    HbgA1C:  A1C 11.2% on 12/26/22 indicating an average glucose of 275 mg/dl over the past 2-3 months.  NOTE: Patient admitted with acute decompensated systolic heart failure, poorly controlled DM, and PVD with lower extremity wounds. Per note on 12/26/22 by Boyce Medici, PA, patient has difficulty affording meds and patient reported being out of insulin and not taking insulin in several days. Per chart, patient has [employer] and Pasty Arch, CPhT notes that patient has $4500 deductible.  Will plan to follow up with patient regarding DM.   Addendum 12/27/22@13 :10-Briefly spoke with patient over the phone (diabetes coordinator working from [employer]). Patient states that he has insurance and he has recently had issues with affording medications. Patient reports that he has 70/30 insulin at home which was able to get [facility] the [facility]. Patient states he should be able to continue to get the  70/30 insulin from the health department in the future. Patient stated that he has to use the bathroom and someone just came in the room and he hung up the phone and ended the conversation.    Thanks, Orlando Penner, RN, MSN, CDCES Diabetes Coordinator Inpatient Diabetes Program [phone] (Team Pager from 8am to 5pm)'

## 2022-12-27 NOTE — Hospital Course (Signed)
Breathing is better. Did not sleep well last night. Urinating a lot. Abdomen is less swollen. His legs feel like they have loosened up. Discussed wound care nurse coming by to recommend wound care. Appetite is good.

## 2022-12-27 NOTE — Consult Note (Signed)
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  Wound type: chronic nonhealing neuropathic wounds and nonhealing surgical site on right foot. TMA.  Patient with uncontrolled diabetes. His current A1C is 11.2.  He has not followed up with recommended wound care center.  We discuss risk of life altering limb loss due to unresolved/untreated wounds.  Pressure Injury POA: NA Measurement:Right TMA site:  nonintact suture line with hard calloused edges of nonviable tissue.  Minimal serosanguinous oozes around calloused, raised edges.  Left first toe:  0.4 cm nonintact wound to plantar surface Left plantar foot near heel:  1.5 cm x 3.2 x 0.7 cm nonintact lesion Wound bed: Raised nonviable tissue Drainage (amount, consistency, odor) minimal serosanguinous  musty odor Periwound: calloused edges to left foot wounds.  Explained the benefit of the wound center to manage and pare down the callous to promote healing.  Dressing procedure/placement/frequency: Cleanse wounds to left foot near heel and left great toe with NS.  Apply aquacel to wound bed, filling in defect.  Top with dry gauze and wrap with kerlix and tape. Cleanse TMA site on right foot with soap and water and pat dry. Paint with betadine.  Dry dressing, kerlix and tape. Will not follow at this time.  Please re-consult if needed.  Mike Gip MSN, RN, FNP-BC CWON Wound, Ostomy, Continence Nurse [facility] [phone] Pager [phone]

## 2022-12-27 NOTE — TOC Benefit Eligibility Note (Signed)
Patient Product/process development scientist completed.    The patient is currently admitted and upon discharge could be taking Entresto 24-26 mg.  The current 30 day co-pay is $656.56 due to a $4500 deductible.   The patient is insured through [employer]   This test claim was processed through [facility]- copay amounts may vary at other pharmacies due to [employer], or as the patient moves through the different stages of their insurance plan.  Roland Earl, CPHT Pharmacy Patient Advocate Specialist [facility] Patient Advocate Team Direct Number: [phone]  Fax: [phone]

## 2022-12-27 NOTE — Evaluation (Signed)
Occupational Therapy Evaluation and DC Summary  Patient Details Name: Dakota Ramirez MRN: [id number] DOB: 1971-02-06 Today's Date: 12/27/2022   History of Present Illness 52 yo male admitted 5/15 with decompensated CHF. PMhx: CHF, DM, PVD, NICM, Rt transmetatarsal amp   Clinical Impression   Pt admitted for above dx, PTA patient lived alone and sponge bathes at baseline, ambulated no AD typically but may use SPC if fluid and SOB sets in.  Pt ambulated 466ft with supervision no AD and denies SOB, unable to obtain accurate Sp02 reading. He reports 2/10 level of exertion following functional mobility. Educated/reinforced CHF signs/symptoms/precautions. Pt verbalized understanding but given aptitude pt is unlikely to adhere to educational advice. Pt presenting near functional baseline and has no further acute skilled OT needs at this time. No follow-up OT recommended at this time, reconsult OT if pt functional status changes.      Recommendations for follow up therapy are one component of a multi-disciplinary discharge planning process, led by the attending physician.  Recommendations may be updated based on patient status, additional functional criteria and insurance authorization.   Assistance Recommended at Discharge PRN  Patient can return home with the following Direct supervision/assist for medications management    Functional Status Assessment  Patient has not had a recent decline in their functional status  Equipment Recommendations  None recommended by OT    Recommendations for Other Services       Precautions / Restrictions Restrictions Weight Bearing Restrictions: No      Mobility Bed Mobility               General bed mobility comments: pt rec'd sitting EOB and left seated in recliner, anticipate Mod I level    Transfers Overall transfer level: Modified independent                        Balance Overall balance assessment: Mild deficits observed, not  formally tested                                         ADL either performed or assessed with clinical judgement   ADL Overall ADL's : Needs assistance/impaired Eating/Feeding: Independent;Sitting   Grooming: Independent;Standing   Upper Body Bathing: Independent;Sitting   Lower Body Bathing: Independent;Sitting/lateral leans   Upper Body Dressing : Supervision/safety;Set up;Sitting Upper Body Dressing Details (indicate cue type and reason): donned gown Lower Body Dressing: Maximal assistance;Sitting/lateral leans Lower Body Dressing Details (indicate cue type and reason): Max A to don socks, pt reports having slip on shoes at home Toilet Transfer: Supervision/safety;Ambulation   Toileting- Clothing Manipulation and Hygiene: Supervision/safety;Sit to/from stand   Tub/ Shower Transfer: Supervision/safety;Ambulation Tub/Shower Transfer Details (indicate cue type and reason): sponge bathes at baseline Functional mobility during ADLs: Supervision/safety       Vision         Perception     Praxis      Pertinent Vitals/Pain Pain Assessment Pain Assessment: No/denies pain     Hand Dominance     Extremity/Trunk Assessment Upper Extremity Assessment Upper Extremity Assessment: Overall WFL for tasks assessed   Lower Extremity Assessment Lower Extremity Assessment: Overall WFL for tasks assessed;RLE deficits/detail RLE Deficits / Details: Transmetatarsal amputation   Cervical / Trunk Assessment Cervical / Trunk Assessment: Normal   Communication Communication Communication: No difficulties   Cognition Arousal/Alertness: Awake/alert Behavior During Therapy: WFL for  tasks assessed/performed Overall Cognitive Status: Within Functional Limits for tasks assessed                                       General Comments  HR resting at 118 bpm up to 130 following functional ambulation. Unable to obtain accurate Sp02 pleth. Pt on RA but  denies any signs/symptoms of SOB    Exercises     Shoulder Instructions      Home Living Family/patient expects to be discharged to:: Private residence Living Arrangements: Parent Available Help at Discharge: Family;Available PRN/intermittently Type of Home: Other(Comment) (camper behind his father's property)       Home Layout: One level     Bathroom Shower/Tub: Sponge bathes at baseline         Home Equipment: Cane - single point   Additional Comments: Pt lives in camper without stairs, no water, sponge bathes, pees outside and BM in a bucket.      Prior Functioning/Environment Prior Level of Function : Independent/Modified Independent             Mobility Comments: independent no AD, May use SPC when exacerbation of SOB and fluid ADLs Comments: pt reports independence in bADLs/iADLs. sponge bathes and typically has slip on shoes        OT Problem List: Cardiopulmonary status limiting activity      OT Treatment/Interventions:      OT Goals(Current goals can be found in the care plan section) Acute Rehab OT Goals Patient Stated Goal: To go home OT Goal Formulation: With patient Time For Goal Achievement: 01/10/23 Potential to Achieve Goals: Good  OT Frequency:      Co-evaluation              AM-PAC OT "6 Clicks" Daily Activity     Outcome Measure Help from another person eating meals?: None Help from another person taking care of personal grooming?: A Little Help from another person toileting, which includes using toliet, bedpan, or urinal?: A Little Help from another person bathing (including washing, rinsing, drying)?: None Help from another person to put on and taking off regular upper body clothing?: A Little Help from another person to put on and taking off regular lower body clothing?: A Lot 6 Click Score: 19   End of Session Equipment Utilized During Treatment: Gait belt Nurse Communication: Mobility status  Activity Tolerance: Patient  tolerated treatment well Patient left: in chair;with call bell/phone within reach  OT Visit Diagnosis: Unsteadiness on feet (R26.81)                Time: 1610-9604 OT Time Calculation (min): 26 min Charges:  OT General Charges $OT Visit: 1 Visit OT Evaluation $OT Eval Moderate Complexity: 1 Mod OT Treatments $Therapeutic Activity: 8-22 mins  12/27/2022  AB, OTR/L  Acute Rehabilitation Services  Office: [phone]   Tristan Schroeder 12/27/2022, 5:47 PM

## 2022-12-27 NOTE — Progress Notes (Signed)
PT Cancellation Note  Patient Details Name: Dakota Ramirez MRN: [id number] DOB: March 04, 1971   Cancelled Treatment:    Reason Eval/Treat Not Completed: Patient declined, no reason specified (pt stating he urinated on his clothes and refuses paper scrubs or hospital gown and will not get up until someone can bring him clothes)   [name] B [name] 12/27/2022, 10:22 AM Merryl Hacker, PT Acute Rehabilitation Services Office: [phone]

## 2022-12-27 NOTE — Progress Notes (Addendum)
Advanced Heart Failure Rounding Note  PCP-Cardiologist: Dr. Gala Romney    Subjective:    5/15: admitted for a/c CHF w/ volume overload, initial BNP 2,409.   Good response to IV Lasix, 4.2L in UOP yesterday. Wt trending down. SCr stable 1.0, K 4.0. Mag markedly low at 1.2 (IV supp ordered).  Feels a bit better. No resting dyspnea currently.     Objective:   Weight Range: 126.3 kg Body mass index is 38.83 kg/m.   Vital Signs:   Temp:  [97.2 F (36.2 C)-98.4 F (36.9 C)] 97.8 F (36.6 C) (05/16 0735) Pulse Rate:  [41-122] 120 (05/16 0735) Resp:  [11-32] 18 (05/16 0318) BP: (101-129)/(67-109) 122/109 (05/16 0735) SpO2:  [94 %-100 %] 97 % (05/16 0318) Weight:  [126.3 kg-133.2 kg] 126.3 kg (05/16 0600) Last BM Date : 12/26/22  Weight change: Filed Weights   12/26/22 1559 12/26/22 1831 12/27/22 0600  Weight: 133.2 kg 128.8 kg 126.3 kg    Intake/Output:   Intake/Output Summary (Last 24 hours) at 12/27/2022 0758 Last data filed at 12/27/2022 0600 Gross per 24 hour  Intake 483 ml  Output 4200 ml  Net -3717 ml      Physical Exam    General:  chronically ill appearing. No resp difficulty HEENT: Normal Neck: Supple. JVP elevated to jaw . Carotids 2+ bilat; no bruits. No lymphadenopathy or thyromegaly appreciated. Cor: PMI nondisplaced. Regular rate & rhythm. + rub RSB. Lungs: decreased BS at the bases bilaterally  Abdomen: obese, distended + abdominal wall edema, nontender,  No hepatosplenomegaly. No bruits or masses. Good bowel sounds. Extremities: No cyanosis, clubbing, rash, 1+ b/l LEE up to thighs, chronic venous stasis dermatitis and LE wounds  Neuro: Alert & orientedx3, cranial nerves grossly intact. moves all 4 extremities w/o difficulty. Affect pleasant   Telemetry   Sinus tach low 100s, occasional PVCs   EKG    No new EKG to review   Labs    CBC Recent Labs    12/26/22 1132 12/26/22 1419  WBC 5.8 5.8  HGB 13.4 12.9*  HCT 44.2 41.7  MCV 88.6  89.9  PLT 238 247   Basic Metabolic Panel Recent Labs    16/10/96 1419 12/26/22 2113 12/27/22 0543  NA 132*  --  132*  K 4.7  --  4.0  CL 96*  --  94*  CO2 28  --  31  GLUCOSE 201*  --  188*  BUN 11  --  12  CREATININE 0.86  --  1.01  CALCIUM 7.8*  --  7.7*  MG  --  1.1* 1.2*   Liver Function Tests Recent Labs    12/26/22 1132  AST 31  ALT 20  ALKPHOS 208*  BILITOT 1.0  PROT 8.8*  ALBUMIN 1.8*   No results for input(s): "LIPASE", "AMYLASE" in the last 72 hours. Cardiac Enzymes No results for input(s): "CKTOTAL", "CKMB", "CKMBINDEX", "TROPONINI" in the last 72 hours.  BNP: BNP (last 3 results) Recent Labs    09/04/22 1350 12/26/22 1132 12/26/22 1419  BNP 622.6* 2,409.9* 1,929.8*    ProBNP (last 3 results) No results for input(s): "PROBNP" in the last 8760 hours.   D-Dimer No results for input(s): "DDIMER" in the last 72 hours. Hemoglobin A1C Recent Labs    12/26/22 1132  HGBA1C 11.2*   Fasting Lipid Panel No results for input(s): "CHOL", "HDL", "LDLCALC", "TRIG", "CHOLHDL", "LDLDIRECT" in the last 72 hours. Thyroid Function Tests No results for input(s): "TSH", "T4TOTAL", "T3FREE", "THYROIDAB"  in the last 72 hours.  Invalid input(s): "FREET3"  Other results:   Imaging    DG Chest 2 View  Result Date: 12/26/2022 CLINICAL DATA:  Shortness of breath. Do not take insulin this morning. Weakness and lethargy. EXAM: CHEST - 2 VIEW COMPARISON:  11/26/2021 and CT chest 11/26/2021. FINDINGS: Trachea is midline. Heart is enlarged. Lungs are clear. No pleural fluid. Old left rib fractures. IMPRESSION: No acute findings. Electronically Signed   By: Leanna Battles M.D.   On: 12/26/2022 13:14     Medications:     Scheduled Medications:  Chlorhexidine Gluconate Cloth  6 each Topical Q0600   digoxin  0.125 mg Oral Daily   enoxaparin (LOVENOX) injection  60 mg Subcutaneous Q24H   furosemide  80 mg Intravenous BID   insulin aspart  0-20 Units  Subcutaneous TID WC   insulin glargine-yfgn  20 Units Subcutaneous QHS   living well with diabetes book   Does not apply Once   melatonin  3 mg Oral QHS   mupirocin ointment  1 Application Nasal BID   sacubitril-valsartan  1 tablet Oral BID   sodium chloride flush  3 mL Intravenous Q12H   spironolactone  25 mg Oral Daily    Infusions:  sodium chloride     magnesium sulfate bolus IVPB      PRN Medications: sodium chloride, acetaminophen, camphor-menthol, ondansetron (ZOFRAN) IV, sodium chloride flush    Patient Profile   52 y/o male w/ h/o 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   Assessment/Plan   1. Acute on Chronic Systolic Heart Failure - NICM. Reports hx CHF in his mother. No hx alcohol or drug abuse. 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 RVEF 22% LGE imaging very poor quality, which limits interpretation. Appears to have RV insertion site LGE (associated with worse prognosis) - NYHA IIIb. Admitted w/ volume overload, in setting of poor med compliance/reported inability to afford meds. Minimal improvement in up titration of home Lasix regimen. Up ~40 lb from previous discharge wt. Initial ReDs 42%  - responding well to IV Lasix. Continue Lasix 80 mg bid  - Continue Entresto 49/51 mg bid. - Continue digoxin 0.125 mg daily.   - Continue spiro 25 mg daily. - Hold off on ? blocker until fully diuresed  - No SGLT2i with uncontrolled DM and concern for hygiene. - Daily wts, strict I/Os, low sodium/carb modified diet w/  fluid restriction < 1800 ml  - Repeat 2D Echo (rub noted on exam, ? Effusion)   2. Uncontrolled DM2: - poorly controlled, Hgb A1c 11.2  - reports he has been off insulin for several days, unable to afford refills  - Insulin per primary team      3. Hx osteomyelitis s/p right TMA: - 2/2 poorly controlled DM  - currently w/ several LE wounds, primary team to manage    4. HTN: - elevated, in setting of missed meds + volume overload - Restart GDMT + diuresis per above    5. Hypoalbuminemia: - Admit Albumin 1.8  - Prior Liver US with  echogenic parenchyma, ? Hepatic steatosis or fibrosis. Cannot exclude cirrhosis. - hepC antibody NR  -? Secondary to RV failure - Management per primary team   6. Hypomagnesemia - Mg 1.2 - Aggressive Mg supp, give 4 gm MgSO4 now. Repeat Mg later today  - monitor on tele  - obtain 12 lead EKG to check QTc. Will d/c PRN Zofran order for now to reduce risk for QT prolongation   7. SDOH:  - Now insured. - Lives in a camper behind father's house. Unemployed and has limited social support. - Needs Paramedicine but he has refulsed, HFSW helping.    Length of Stay: 1  Robbie Lis, PA-C  12/27/2022, 7:58 AM  Advanced Heart Failure Team Pager [phone] (M-F; 7a - 5p)  Please contact [facility] for night-coverage after hours (5p -7a ) and weekends on [email or web address]  Patient seen with PA, agree with the above note.   Good diuresis today, I/Os net negative 3717.  Frustrated with being in hospital.   General: NAD Neck: JVP 14-16 cm, no thyromegaly or thyroid nodule.  Lungs: Clear to auscultation bilaterally with normal respiratory effort. CV: Nondisplaced PMI.  Heart regular S1/S2, no S3/S4, no murmur.  1+ ankle edema.  Abdomen: Soft, nontender, no hepatosplenomegaly, no distention.  Skin: Intact without lesions or rashes.  Neurologic: Alert and oriented x 3.  Psych: Normal affect. Extremities: No clubbing or cyanosis.  HEENT: Normal.    Patient was admitted with acute on chronic systolic CHF, has long-standing NICM.  Cardiac MRI in 4/24 showed LV EF 17%, RV EF 22%.  Compliance with outpatient regimen is questionable.  Still with volume overload on exam.  - Continue Lasix 80 mg IV bid.  - Continue current Entresto, spironolactone, and digoxin.   Will need wound care for lower extremity wounds.   Marca Ancona 12/27/2022 10:27 AM

## 2022-12-27 NOTE — Progress Notes (Signed)
Heart Failure Navigator Progress Note  Assessed for Heart & Vascular TOC clinic readiness.  Patient does not meet criteria due to Advanced Heart Failure Team patient of Dr. Bensimhon.   Navigator will sign off at this time.   Adelaide Pfefferkorn, BSN, RN Heart Failure Nurse Navigator Secure Chat Only   

## 2022-12-27 NOTE — Progress Notes (Signed)
                 Interval history Magnesium administered, hydroxyzine for anxiety overnight.  Frustrated about being in the hospital.  Symptomatically feels improved.  Tolerating laying in a semireclined position.  No dyspnea at rest.  Physical exam Blood pressure 107/89, pulse 98, temperature 98.4 F (36.9 C), temperature source Oral, resp. rate 18, height 5\' 11"  (1.803 m), weight 126.3 kg, SpO2 97 %.  No apparent distress Heart rate is tachycardic, rhythm is regular, JVD to the mandible, lower extremity edema Breathing is regular and unlabored on room air, bibasilar crackles Skin is warm and dry, bilateral lower extremity wounds documented by photo, notable for deep tunneling ulceration on the left heel Alert and oriented Frustrated and agitated, concordant affect  Intake/Output Summary (Last 24 hours) at 12/27/2022 1204 Last data filed at 12/27/2022 1155 Gross per 24 hour  Intake 883 ml  Output 4950 ml  Net -4067 ml   Net IO Since Admission: -4,067 mL [12/27/22 1204]  Labs, images, and other studies Sodium 132 Potassium 4 Magnesium 1.2 PT/INR 16.2/1.3 GGT 96  Assessment and plan Hospital day 1  Dakota Ramirez is a 52 y.o. with severe biventricular heart failure and poorly controlled diabetes admitted for acute decompensated heart failure.  Principal Problem:   Acute on chronic heart failure (HCC) Active Problems:   Type 2 diabetes mellitus (HCC)   Elevated alkaline phosphatase level  Acute on chronic biventricular heart failure Diuresing well, net -4 and half liters.  Remains tachycardic but with good blood pressure and well-perfused.  Continue IV diuresis, add carbonic anhydrase inhibitor.  Holding beta-blocker. - Start acetazolamide 500 mg IV - Lasix 80 mg IV twice daily - Digoxin 0.125 mg daily - Entresto 100 mg twice daily - Spironolactone 25 mg daily  Bilateral lower extremity wounds Painless, look like diabetic foot ulcers.  The one on the left heel is  tunneled quite deeply.  Will have WOC see and evaluate. - Left foot x-ray - ESR/CRP  Poorly controlled type 2 diabetes Blood sugar coming down on current regimen. - Insulin glargine 20 units daily - SSI  Hypomagnesemia Poor response to 2 g yesterday.  Status post 4 g of mag today.  Repeat magnesium level tomorrow.  Elevated alkaline phosphatase High GGT suggestive of a cholestatic pattern.  Wonder if this is due to congestive hepatopathy.  Also has a high NAFLD fibrosis score. - Right upper quadrant ultrasound  Diet: Heart healthy 1800 mL fluid restriction IVF: None VTE: Enoxaparin 60 mg daily Code: DNR PT/OT recommendations: Declined therapy  Discharge plan: Pending diuresis  Marrianne Mood MD 12/27/2022, 12:04 PM  Pager: [phone] After 5pm or weekend: [phone]

## 2022-12-27 NOTE — Plan of Care (Signed)
  Problem: Clinical Measurements: Goal: Respiratory complications will improve Outcome: Progressing Goal: Cardiovascular complication will be avoided Outcome: Progressing   Problem: Activity: Goal: Risk for activity intolerance will decrease Outcome: Progressing   Problem: Elimination: Goal: Will not experience complications related to urinary retention Outcome: Progressing   Problem: Pain Managment: Goal: General experience of comfort will improve Outcome: Progressing   Problem: Safety: Goal: Ability to remain free from injury will improve Outcome: Progressing   

## 2022-12-27 NOTE — Telephone Encounter (Signed)
Pharmacy Patient Advocate Encounter  Insurance verification completed.    The patient is insured through [employer]   The patient is currently admitted and ran test claims for the following: Entresto.  Copays and coinsurance results were relayed to Inpatient clinical team.      

## 2022-12-28 ENCOUNTER — Inpatient Hospital Stay (HOSPITAL_COMMUNITY): Payer: 59

## 2022-12-28 ENCOUNTER — Other Ambulatory Visit (HOSPITAL_COMMUNITY): Payer: Self-pay

## 2022-12-28 ENCOUNTER — Encounter (HOSPITAL_COMMUNITY): Payer: Self-pay | Admitting: Internal Medicine

## 2022-12-28 DIAGNOSIS — E1169 Type 2 diabetes mellitus with other specified complication: Secondary | ICD-10-CM | POA: Diagnosis present

## 2022-12-28 DIAGNOSIS — L97909 Non-pressure chronic ulcer of unspecified part of unspecified lower leg with unspecified severity: Secondary | ICD-10-CM | POA: Diagnosis not present

## 2022-12-28 DIAGNOSIS — L97421 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 to breakdown of skin: Secondary | ICD-10-CM

## 2022-12-28 DIAGNOSIS — I5043 Acute on chronic combined systolic (congestive) and diastolic (congestive) heart failure: Secondary | ICD-10-CM | POA: Diagnosis not present

## 2022-12-28 DIAGNOSIS — E43 Unspecified severe protein-calorie malnutrition: Secondary | ICD-10-CM | POA: Diagnosis not present

## 2022-12-28 DIAGNOSIS — I5023 Acute on chronic systolic (congestive) heart failure: Secondary | ICD-10-CM

## 2022-12-28 DIAGNOSIS — E876 Hypokalemia: Secondary | ICD-10-CM

## 2022-12-28 LAB — BASIC METABOLIC PANEL
Anion gap: 9 (ref 5–15)
BUN: 15 mg/dL (ref 6–20)
CO2: 28 mmol/L (ref 22–32)
Calcium: 7.9 mg/dL — ABNORMAL LOW (ref 8.9–10.3)
Chloride: 92 mmol/L — ABNORMAL LOW (ref 98–111)
Creatinine, Ser: 0.98 mg/dL (ref 0.61–1.24)
GFR, Estimated: >60 mL/min (ref >60)
Glucose, Bld: 176 mg/dL — ABNORMAL HIGH (ref 70–99)
Potassium: 3.7 mmol/L (ref 3.5–5.1)
Sodium: 129 mmol/L — ABNORMAL LOW (ref 135–145)

## 2022-12-28 LAB — SEDIMENTATION RATE: Sed Rate: 68 mm/hr — ABNORMAL HIGH (ref 0–16)

## 2022-12-28 LAB — GLUCOSE, CAPILLARY
Glucose-Capillary: 130 mg/dL — ABNORMAL HIGH (ref 70–99)
Glucose-Capillary: 146 mg/dL — ABNORMAL HIGH (ref 70–99)
Glucose-Capillary: 121 mg/dL — ABNORMAL HIGH (ref 70–99)
Glucose-Capillary: 159 mg/dL — ABNORMAL HIGH (ref 70–99)

## 2022-12-28 LAB — MAGNESIUM: Magnesium: 1.8 mg/dL (ref 1.7–2.4)

## 2022-12-28 LAB — C-REACTIVE PROTEIN: CRP: 5.6 mg/dL — ABNORMAL HIGH (ref <1.0)

## 2022-12-28 LAB — VAS US ABI WITH/WO TBI
Left ABI: 1.05
Right ABI: 1.1

## 2022-12-28 MED ORDER — SACUBITRIL-VALSARTAN 97-103 MG PO TABS
1.0000 | ORAL_TABLET | Freq: Two times a day (BID) | ORAL | 3 refills | Status: DC
  Filled 2022-12-28: qty 60, 30d supply, fill #0

## 2022-12-28 MED ORDER — MEDIHONEY WOUND/BURN DRESSING EX PSTE
1.0000 | PASTE | Freq: Every day | CUTANEOUS | Status: DC
  Filled 2022-12-28: qty 44

## 2022-12-28 MED ORDER — ACETAZOLAMIDE SODIUM 500 MG IJ SOLR
500.0000 mg | Freq: Once | INTRAMUSCULAR | Status: AC
  Administered 2022-12-28: 500 mg via INTRAVENOUS
  Filled 2022-12-28: qty 500

## 2022-12-28 MED ORDER — POTASSIUM CHLORIDE CRYS ER 20 MEQ PO TBCR
40.0000 meq | EXTENDED_RELEASE_TABLET | Freq: Once | ORAL | Status: AC
  Administered 2022-12-28: 40 meq via ORAL
  Filled 2022-12-28: qty 2

## 2022-12-28 MED ORDER — SACUBITRIL-VALSARTAN 97-103 MG PO TABS
1.0000 | ORAL_TABLET | Freq: Two times a day (BID) | ORAL | Status: DC
  Administered 2022-12-28 – 2022-12-29 (×2): 1 via ORAL
  Filled 2022-12-28 (×2): qty 1

## 2022-12-28 MED ORDER — MAGNESIUM SULFATE 2 GM/50ML IV SOLN
2.0000 g | Freq: Once | INTRAVENOUS | Status: AC
  Administered 2022-12-28: 2 g via INTRAVENOUS
  Filled 2022-12-28: qty 50

## 2022-12-28 MED ORDER — GADOBUTROL 1 MMOL/ML IV SOLN
10.0000 mL | Freq: Once | INTRAVENOUS | Status: AC | PRN
  Administered 2022-12-28: 10 mL via INTRAVENOUS

## 2022-12-28 NOTE — Consult Note (Signed)
ORTHOPAEDIC CONSULTATION  REQUESTING PHYSICIAN: Gust Rung, DO  Chief Complaint: Chronic ulcer left heel with venous insufficiency ulcer right lower extremity.  HPI: Dakota Ramirez is a 52 y.o. 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  Patient has a chronic left heel ulcer.  Past Medical History:  Diagnosis Date   Diabetes mellitus without complication (HCC)    Heart failure with reduced ejection fraction (HCC) 11/26/2021   Hypertension    Past Surgical History:  Procedure Laterality Date   BACK SURGERY     CHOLECYSTECTOMY     RIGHT/LEFT HEART CATH AND CORONARY ANGIOGRAPHY N/A 11/30/2021   Procedure: RIGHT/LEFT HEART CATH AND CORONARY ANGIOGRAPHY;  Surgeon: Dolores Patty, MD;  Location: [facility];  Service: Cardiovascular;  Laterality: N/A;   TOE AMPUTATION Right    all toes on RT foot   Social History   Socioeconomic History   Marital status: Single    Spouse name: Not on file   Number of children: Not on file   Years of education: Not on file   Highest education level: Not on file  Occupational History   Not on file  Tobacco Use   Smoking status: Never   Smokeless tobacco: Never  Vaping Use   Vaping Use: Never used  Substance and Sexual Activity   Alcohol use: Never   Drug use: Never   Sexual activity: Not on file  Other Topics Concern   Not on file  Social History Narrative   Not on file   Social Determinants of Health   Financial Resource Strain: High Risk (11/29/2021)   Overall Financial Resource Strain (CARDIA)    Difficulty of Paying Living Expenses: Hard  Food Insecurity: No Food Insecurity (11/29/2021)   Hunger Vital Sign    Worried About Running Out of Food in the Last Year: Never true    Ran Out of Food in the Last Year: Never true  Transportation Needs: No Transportation Needs (11/29/2021)   PRAPARE -  Administrator, Civil Service (Medical): No    Lack of Transportation (Non-Medical): No  Physical Activity: Not on file  Stress: Not on file  Social Connections: Not on file   Family History  Problem Relation Age of Onset   Diabetes Mother    Hypertension Mother    - negative except otherwise stated in the family history section No Known Allergies Prior to Admission medications   Medication Sig Start Date End Date Taking? Authorizing Provider  Blood Glucose Monitoring Suppl (BLOOD GLUCOSE MONITOR SYSTEM) w/Device KIT Use to check blood sugars 4 times daily. 12/01/21   Arrien, York Ram, MD  carvedilol (COREG) 6.25 MG tablet Take 1 tablet (6.25 mg total) by mouth 2 (two) times daily. 12/12/22   Bensimhon, Bevelyn Buckles, MD  digoxin (LANOXIN) 0.125 MG tablet Take 1 tablet (0.125 mg total) by mouth daily. 12/12/22   Bensimhon, Bevelyn Buckles, MD  furosemide (LASIX) 40 MG tablet Take 1 tablet (40 mg total) by mouth daily. 12/12/22   Bensimhon, Bevelyn Buckles, MD  insulin aspart protamine - aspart (NOVOLOG 70/30 MIX) (70-30) 100 UNIT/ML FlexPen Inject 15 Units into the skin 2 (two) times daily. Patient taking differently: Inject 25 Units into the skin 2 (two) times daily with a meal. 12/01/21 01/12/23  Arrien, York Ram, MD  potassium chloride SA (KLOR-CON M) 20 MEQ tablet Take 1 tablet (20 mEq  total) by mouth 2 (two) times daily. 12/12/22   Bensimhon, Bevelyn Buckles, MD  sacubitril-valsartan (ENTRESTO) 49-51 MG Take 1 tablet by mouth 2 (two) times daily. 12/12/22   Bensimhon, Bevelyn Buckles, MD  sacubitril-valsartan (ENTRESTO) 97-103 MG Take 1 tablet by mouth 2 (two) times daily. 12/28/22   Gust Rung, DO  spironolactone (ALDACTONE) 25 MG tablet Take 1 tablet (25 mg total) by mouth daily. 12/12/22   Bensimhon, Bevelyn Buckles, MD   MR FOOT LEFT W WO CONTRAST  Result Date: 12/28/2022 CLINICAL DATA:  Osteomyelitis suspected, foot, xray done osteomyelitis; Osteomyelitis suspected, ankle, xray done osteomyelitis EXAM:  MRI OF THE LEFT FOREFOOT WITHOUT AND WITH CONTRAST; MRI OF THE LEFT ANKLE WITHOUT AND WITH CONTRAST TECHNIQUE: Multiplanar, multisequence MR imaging of the left foot and ankle was performed both before and after administration of intravenous contrast. CONTRAST:  10mL GADAVIST GADOBUTROL 1 MMOL/ML IV SOLN COMPARISON:  Radiograph 12/27/2022 FINDINGS: MRI left foot: Bones/Joint/Cartilage There is marrow edema and confluent low T1 signal in the great toe distal phalanx with associated enhancement. There is mild osteoarthritis in the midfoot and of the first MTP joint. Ligaments Intact Lisfranc ligament.  Intact MTP collateral ligaments. Muscles and Tendons No acute tendon tear. Diffuse muscle edema and atrophy in the foot as is commonly seen in diabetics compatible with chronic denervation. Soft tissues There is soft tissue swelling of the foot most prominent dorsally. No focal fluid collection. MRI left ankle: TENDONS Peroneal: Accessory peroneal quartus. Peroneal longus and brevis tendons are intact. Posteromedial: Mild tenosynovitis of the posterior tibial tendon at the level of the talar head/neck. No PT tendon tear. Intact flexor hallucis longus and flexor digitorum longus tendons. Anterior: Intact tibialis anterior, extensor hallucis longus and extensor digitorum longus tendons. Achilles: Intact. Plantar Fascia: There is thickening and increased signal within the central band plantar fascia proximally, with adjacent plantar soft tissue ulcer. Minimal edema within the adjacent plantar posterior calcaneus with plantar calcaneal spur. LIGAMENTS Lateral: Anterior talofibular ligament intact. Calcaneofibular ligament intact. Posterior talofibular ligament intact. Anterior and posterior tibiofibular ligaments intact. Medial: Deltoid ligament intact. Spring ligament intact. CARTILAGE Ankle Joint: No significant joint effusion. Mild tibiotalar osteoarthritis. No osteochondral defect. Subtalar Joints/Sinus Tarsi: Mild  posterior and middle subtalar osteoarthritis. Mild edema and loss of fat signal in the sinus tarsi. Bones: There is mild edema and enhancement within the lateral plantar calcaneus. Minimal edema within the posterior plantar calcaneus near the plantar fascia insertion. Preserved T1 marrow signal. Soft Tissue: There is a soft tissue ulcer of the plantar medial heel, which extends in close proximity to the proximal central band of the plantar fascia. There is adjacent edema signal and soft tissue enhancement without well organized/drainable fluid collection. Generalized mild ankle soft tissue swelling. IMPRESSION: Osteomyelitis of the great toe distal phalanx. Plantar medial heel soft tissue ulcer with adjacent cellulitis and inflammation/infectious involvement of the adjacent central band of the proximal plantar fascia. No evidence of soft tissue abscess. Very mild edema and enhancement within the posterior plantar calcaneus at the plantar fascia insertion, favored to reflect reactive marrow change, though early osteomyelitis could have a similar appearance. Mild marrow signal abnormality and the lateral plantar calcaneus, favored to represent reactive marrow change. Mild tenosynovitis of the posterior tibial tendon at the level of the talar head/neck. No evidence of acute tendon tear. 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 Electronically Signed   By: Erma Heritage.D.  On: 12/28/2022 15:56   MR ANKLE LEFT W WO CONTRAST  Result Date: 12/28/2022 CLINICAL DATA:  Osteomyelitis suspected, foot, xray done osteomyelitis; Osteomyelitis suspected, ankle, xray done osteomyelitis EXAM: MRI OF THE LEFT FOREFOOT WITHOUT AND WITH CONTRAST; MRI OF THE LEFT ANKLE WITHOUT AND WITH CONTRAST TECHNIQUE: Multiplanar, multisequence MR imaging of the left foot and ankle was performed both before and after administration of intravenous contrast. CONTRAST:  10mL GADAVIST GADOBUTROL 1  MMOL/ML IV SOLN COMPARISON:  Radiograph 12/27/2022 FINDINGS: MRI left foot: Bones/Joint/Cartilage There is marrow edema and confluent low T1 signal in the great toe distal phalanx with associated enhancement. There is mild osteoarthritis in the midfoot and of the first MTP joint. Ligaments Intact Lisfranc ligament.  Intact MTP collateral ligaments. Muscles and Tendons No acute tendon tear. Diffuse muscle edema and atrophy in the foot as is commonly seen in diabetics compatible with chronic denervation. Soft tissues There is soft tissue swelling of the foot most prominent dorsally. No focal fluid collection. MRI left ankle: TENDONS Peroneal: Accessory peroneal quartus. Peroneal longus and brevis tendons are intact. Posteromedial: Mild tenosynovitis of the posterior tibial tendon at the level of the talar head/neck. No PT tendon tear. Intact flexor hallucis longus and flexor digitorum longus tendons. Anterior: Intact tibialis anterior, extensor hallucis longus and extensor digitorum longus tendons. Achilles: Intact. Plantar Fascia: There is thickening and increased signal within the central band plantar fascia proximally, with adjacent plantar soft tissue ulcer. Minimal edema within the adjacent plantar posterior calcaneus with plantar calcaneal spur. LIGAMENTS Lateral: Anterior talofibular ligament intact. Calcaneofibular ligament intact. Posterior talofibular ligament intact. Anterior and posterior tibiofibular ligaments intact. Medial: Deltoid ligament intact. Spring ligament intact. CARTILAGE Ankle Joint: No significant joint effusion. Mild tibiotalar osteoarthritis. No osteochondral defect. Subtalar Joints/Sinus Tarsi: Mild posterior and middle subtalar osteoarthritis. Mild edema and loss of fat signal in the sinus tarsi. Bones: There is mild edema and enhancement within the lateral plantar calcaneus. Minimal edema within the posterior plantar calcaneus near the plantar fascia insertion. Preserved T1 marrow  signal. Soft Tissue: There is a soft tissue ulcer of the plantar medial heel, which extends in close proximity to the proximal central band of the plantar fascia. There is adjacent edema signal and soft tissue enhancement without well organized/drainable fluid collection. Generalized mild ankle soft tissue swelling. IMPRESSION: Osteomyelitis of the great toe distal phalanx. Plantar medial heel soft tissue ulcer with adjacent cellulitis and inflammation/infectious involvement of the adjacent central band of the proximal plantar fascia. No evidence of soft tissue abscess. Very mild edema and enhancement within the posterior plantar calcaneus at the plantar fascia insertion, favored to reflect reactive marrow change, though early osteomyelitis could have a similar appearance. Mild marrow signal abnormality and the lateral plantar calcaneus, favored to represent reactive marrow change. Mild tenosynovitis of the posterior tibial tendon at the level of the talar head/neck. No evidence of acute tendon tear. 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 Electronically Signed   By: Caprice Renshaw M.D.   On: 12/28/2022 15:56   VAS [place] ABI WITH/WO TBI  Result Date: 12/28/2022  LOWER EXTREMITY DOPPLER STUDY Patient Name:  SHAWNTEZ CALDERONE  Date of Exam:   12/28/2022 Medical Rec #: [id number]     Accession #:    [id number] Date of Birth: 1970/08/28     Patient Gender: M Patient Age:   26 years Exam Location:  [facility] Procedure:  VAS [place] ABI WITH/WO TBI Referring Phys: ERIK HOFFMAN --------------------------------------------------------------------------------  Indications: Ulceration. right transmetatarsal amputation High Risk Factors: Hypertension, Diabetes.  Limitations: Today's exam was limited due to an open wound and bandages. Comparison Study: No prior studies. Performing Technologist: Olen Cordial RVT  Examination Guidelines: A complete evaluation includes at minimum,  Doppler waveform signals and systolic blood pressure reading at the level of bilateral brachial, anterior tibial, and posterior tibial arteries, when vessel segments are accessible. Bilateral testing is considered an integral part of a complete examination. Photoelectric Plethysmograph (PPG) waveforms and toe systolic pressure readings are included as required and additional duplex testing as needed. Limited examinations for reoccurring indications may be performed as noted.  ABI Findings: +---------+------------------+-----+-----------+----------+ Right    Rt Pressure (mmHg)IndexWaveform   Comment    +---------+------------------+-----+-----------+----------+ Brachial 115                    triphasic             +---------+------------------+-----+-----------+----------+ PTA      130               1.10 multiphasic           +---------+------------------+-----+-----------+----------+ DP       121               1.03 monophasic            +---------+------------------+-----+-----------+----------+ Great Toe                                  Amputation +---------+------------------+-----+-----------+----------+ +---------+------------------+-----+---------+---------------+ Left     Lt Pressure (mmHg)IndexWaveform Comment         +---------+------------------+-----+---------+---------------+ Brachial 118                    triphasic                +---------+------------------+-----+---------+---------------+ PTA      122               1.03 triphasic                +---------+------------------+-----+---------+---------------+ DP       124               1.05 triphasic                +---------+------------------+-----+---------+---------------+ Great Toe                                Great toe ulcer +---------+------------------+-----+---------+---------------+ +-------+-----------+-----------+------------+------------+ ABI/TBIToday's ABIToday's TBIPrevious  ABIPrevious TBI +-------+-----------+-----------+------------+------------+ Right  1.1                                            +-------+-----------+-----------+------------+------------+ Left   1.05                                           +-------+-----------+-----------+------------+------------+  Summary: Right: Resting right ankle-brachial index is within normal range. Unable to obtain TBI due to great toe amputation. Left: Resting left ankle-brachial index is within normal range. Unable to obtain TBI due to great toe ulcers. *See table(s) above for measurements and observations.  Electronically signed by Gerarda Fraction on 12/28/2022 at 2:57:12 PM.    Final    ECHOCARDIOGRAM COMPLETE  Result Date: 12/27/2022    ECHOCARDIOGRAM REPORT   Patient Name:   KERT MAKOSKY Date of Exam: 12/27/2022 Medical Rec #:  [id number]    Height:       71.0 in Accession #:    [id number]   Weight:       278.4 lb Date of Birth:  1971-03-26    BSA:          2.427 m Patient Age:    51 years     BP:           107/89 mmHg Patient Gender: M            HR:           111 bpm. Exam Location:  Inpatient Procedure: 2D Echo, Cardiac Doppler, Color Doppler and Intracardiac            Opacification Agent Indications:    CHF - Acute Systolic  History:        Patient has prior history of Echocardiogram examinations, most                 recent 11/27/2021. CHF, PAD, Arrythmias:Tachycardia,                 Signs/Symptoms:Shortness of Breath and Dyspnea; Risk                 Factors:Diabetes and Hypertension.  Sonographer:    Wallie Char Referring Phys: [id number] Roxy Horseman SIMMONS  Sonographer Comments: Image acquisition challenging due to patient body habitus. IMPRESSIONS  1. Left ventricular ejection fraction, by estimation, is <20%. The left ventricle has severely decreased function. The left ventricle demonstrates global hypokinesis. The left ventricular internal cavity size was severely dilated. Left ventricular diastolic function  could not be evaluated. Elevated left ventricular end-diastolic pressure.  2. Right ventricular systolic function is mildly reduced. The right ventricular size is severely enlarged.  3. Left atrial size was severely dilated.  4. Right atrial size was severely dilated.  5. The mitral valve is normal in structure. Mild to moderate mitral valve regurgitation. No evidence of mitral stenosis.  6. The aortic valve is tricuspid. Aortic valve regurgitation is not visualized. Aortic valve sclerosis/calcification is present, without any evidence of aortic stenosis. Aortic valve area, by VTI measures 2.64 cm. Aortic valve mean gradient measures 2.0 mmHg. Aortic valve Vmax measures 0.92 m/s.  7. The inferior vena cava is normal in size with greater than 50% respiratory variability, suggesting right atrial pressure of 3 mmHg. FINDINGS  Left Ventricle: Left ventricular ejection fraction, by estimation, is <20%. The left ventricle has severely decreased function. The left ventricle demonstrates global hypokinesis. Definity contrast agent was given IV to delineate the left ventricular endocardial borders. The left ventricular internal cavity size was severely dilated. There is no left ventricular hypertrophy. Left ventricular diastolic function could not be evaluated. Elevated left ventricular end-diastolic pressure. Right Ventricle: The right ventricular size is severely enlarged. No increase in right ventricular wall thickness. Right ventricular systolic function is mildly reduced. Left Atrium: Left atrial size was severely dilated. Right Atrium: Right atrial size was severely dilated. Pericardium: Trivial pericardial effusion is present. The pericardial effusion is circumferential. Mitral Valve: The mitral valve is normal in structure. Mild mitral annular calcification. Mild to moderate mitral valve regurgitation, with eccentric posteriorly directed jet. No evidence of mitral valve stenosis. MV peak gradient, 6.5  mmHg. The mean  mitral valve gradient is 3.0 mmHg. Tricuspid Valve: The tricuspid valve is normal in structure. Tricuspid valve regurgitation is mild . No evidence of tricuspid stenosis. Aortic Valve: The aortic valve is tricuspid. Aortic valve regurgitation is not visualized. Aortic valve sclerosis/calcification is present, without any evidence of aortic stenosis. Aortic valve mean gradient measures 2.0 mmHg. Aortic valve peak gradient measures 3.4 mmHg. Aortic valve area, by VTI measures 2.64 cm. Pulmonic Valve: The pulmonic valve was normal in structure. Pulmonic valve regurgitation is trivial. No evidence of pulmonic stenosis. Aorta: The aortic root is normal in size and structure. Venous: The inferior vena cava is normal in size with greater than 50% respiratory variability, suggesting right atrial pressure of 3 mmHg. IAS/Shunts: No atrial level shunt detected by color flow Doppler.  LEFT VENTRICLE PLAX 2D LVIDd:         6.60 cm      Diastology LVIDs:         6.00 cm      LV e' medial:    6.68 cm/s LV PW:         1.20 cm      LV E/e' medial:  18.3 LV IVS:        0.60 cm      LV e' lateral:   9.47 cm/s LVOT diam:     1.70 cm      LV E/e' lateral: 12.9 LV SV:         33 LV SV Index:   14 LVOT Area:     2.27 cm  LV Volumes (MOD) LV vol d, MOD A2C: 296.0 ml LV vol d, MOD A4C: 273.0 ml LV vol s, MOD A2C: 238.0 ml LV vol s, MOD A4C: 221.0 ml LV SV MOD A2C:     58.0 ml LV SV MOD A4C:     273.0 ml LV SV MOD BP:      54.7 ml RIGHT VENTRICLE             IVC RV Basal diam:  5.90 cm     IVC diam: 2.50 cm RV S prime:     14.20 cm/s TAPSE (M-mode): 2.1 cm LEFT ATRIUM              Index        RIGHT ATRIUM           Index LA diam:        4.40 cm  1.81 cm/m   RA Area:     28.70 cm LA Vol (A2C):   107.0 ml 44.09 ml/m  RA Volume:   107.00 ml 44.09 ml/m LA Vol (A4C):   110.0 ml 45.33 ml/m LA Biplane Vol: 113.0 ml 46.56 ml/m  AORTIC VALVE AV Area (Vmax):    2.36 cm AV Area (Vmean):   2.30 cm AV Area (VTI):     2.64 cm AV Vmax:            92.00 cm/s AV Vmean:          67.700 cm/s AV VTI:            0.125 m AV Peak Grad:      3.4 mmHg AV Mean Grad:      2.0 mmHg LVOT Vmax:         95.80 cm/s LVOT Vmean:        68.567 cm/s LVOT VTI:          0.146 m LVOT/AV VTI ratio: 1.16  AORTA Ao Root diam: 2.80 cm Ao Asc diam:  3.60 cm MITRAL VALVE                  TRICUSPID VALVE MV Area (PHT): 6.90 cm       TR Peak grad:   37.0 mmHg MV Area VTI:   1.39 cm       TR Vmax:        304.00 cm/s MV Peak grad:  6.5 mmHg MV Mean grad:  3.0 mmHg       SHUNTS MV Vmax:       1.27 m/s       Systemic VTI:  0.15 m MV Vmean:      80.2 cm/s      Systemic Diam: 1.70 cm MV Decel Time: 110 msec MR Peak grad:    89.5 mmHg MR Mean grad:    58.0 mmHg MR Vmax:         473.00 cm/s MR Vmean:        359.0 cm/s MR PISA:         2.26 cm MR PISA Eff ROA: 15 mm MR PISA Radius:  0.60 cm MV E velocity: 122.00 cm/s Armanda Magic MD Electronically signed by Armanda Magic MD Signature Date/Time: 12/27/2022/3:21:18 PM    Final    US Abdomen Limited RUQ (LIVER/GB)  Result Date: 12/27/2022 CLINICAL DATA:  [id number] Alkaline phosphatase elevation [id number] EXAM: ULTRASOUND ABDOMEN LIMITED RIGHT UPPER QUADRANT COMPARISON:  CT 05/05/2018. FINDINGS: Gallbladder: Surgically absent. Common bile duct: Diameter: 4.1 mm, normal.  No intrahepatic ductal dilation. Liver: Mildly heterogeneous and coarsened echotexture with preserved portal triads and mild surface nodularity. Portal vein is patent on color Doppler imaging with normal direction of blood flow towards the liver. Other: Small right pleural effusion. IMPRESSION: No evidence of biliary obstruction.  Prior cholecystectomy. Mildly heterogeneous liver parenchyma with coarse echotexture and mild surface nodularity, possibly due to hepatic steatosis and/or fibrosis, similar to prior exam. Small right pleural effusion. Electronically Signed   By: Caprice Renshaw M.D.   On: 12/27/2022 14:57   DG Foot 2 Views Left  Result Date: 12/27/2022 CLINICAL DATA:  [id number]  Foot ulcer (HCC) [id number] EXAM: LEFT FOOT - 2 VIEW COMPARISON:  August 29, 2008. FINDINGS: No acute fracture or dislocation. Scattered degenerative changes of the midfoot. No unexpected radiopaque foreign body. Soft tissue defect of the heel and great toe. There is erosive change of the distal first phalanx IMPRESSION: 1. Soft tissue defect of the great toe with underlying erosive change likely reflecting osteomyelitis. 2. Soft tissue defect of the heel without radiographic evidence of underlying osteomyelitis. Electronically Signed   By: Meda Klinefelter M.D.   On: 12/27/2022 14:13   - pertinent xrays, CT, MRI studies were reviewed and independently interpreted  Positive ROS: All other systems have been reviewed and were otherwise negative with the exception of those mentioned in the HPI and as above.  Physical Exam: General: Alert, no acute distress Psychiatric: Patient is competent for consent with normal mood and affect Lymphatic: No axillary or cervical lymphadenopathy Cardiovascular: No pedal edema Respiratory: No cyanosis, no use of accessory musculature GI: No organomegaly, abdomen is soft and non-tender    Images:  @ENCIMAGES @  Labs:  Lab Results  Component Value Date   HGBA1C 11.2 (H) 12/26/2022   HGBA1C 12.4 (H) 11/26/2021   ESRSEDRATE 68 (H) 12/28/2022   CRP 5.6 (H) 12/28/2022    Lab Results  Component Value Date   ALBUMIN 1.8 (L)  12/26/2022   ALBUMIN 2.5 (L) 11/29/2021   ALBUMIN 2.8 (L) 11/26/2021        Latest Ref Rng & Units 12/26/2022    2:19 PM 12/26/2022   11:32 AM 12/13/2021    3:13 PM  CBC EXTENDED  WBC 4.0 - 10.5 K/uL 5.8  5.8  7.2   RBC 4.22 - 5.81 MIL/uL 4.64  4.99  5.29   Hemoglobin 13.0 - 17.0 g/dL 16.1  09.6  04.5   HCT 39.0 - 52.0 % 41.7  44.2  43.3   Platelets 150 - 400 K/uL 247  238  231     Neurologic: Patient does not have protective sensation bilateral lower extremities.   MUSCULOSKELETAL:   Skin: Examination there is a venous  insufficiency ulcer right calf.  Patient has brawny skin color changes of the transmetatarsal amputation of the right consistent with venous and lymphatic insufficiency there is pitting edema to the right lower extremity.  Examination the left lower extremity patient has swelling of the left great toe has a palpable dorsalis pedis pulse.  Patient has an ulcer that probes down to fascia of the left heel.  Review of the MRI scan shows the ulcer extending to fascia there is no abscess no osteomyelitis of the left calcaneus.  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  No ascending cellulitis no purulent drainage.  Hemoglobin A1c 11.2 with an albumin of 1.8.  Hemoglobin 12.9 with a white cell count of 5.8.  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  Plan: Plan: Will place an order for an Unna compression boot for the right lower extremity this will need to be changed weekly.  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  No surgical intervention indicated at this time.  Will start pressure offloading to the left heel and Santyl dressing changes or Medihoney dressing changes.  I will follow-up in the office.  Thank you for the consult and the opportunity to see Mr. Dakota Simonelli, MD [facility] [phone] 4:46 PM

## 2022-12-28 NOTE — Consult Note (Signed)
Reason for Consult:Left foot osteo Referring Physician: Carlynn Purl Time called: 1007 Time at bedside: 1021   Dakota Ramirez is an 52 y.o. male.  HPI: Dakota Ramirez was admitted yesterday with CHF. He was found to have ulcerations on his left foot and x-rays showed great toe osteo. He has ulcerations that he has been seeing podiatry through  [facility] for. They have been there for an unspecified but long time.  Past Medical History:  Diagnosis Date   Diabetes mellitus without complication (HCC)    Heart failure with reduced ejection fraction (HCC) 11/26/2021   Hypertension     Past Surgical History:  Procedure Laterality Date   BACK SURGERY     CHOLECYSTECTOMY     RIGHT/LEFT HEART CATH AND CORONARY ANGIOGRAPHY N/A 11/30/2021   Procedure: RIGHT/LEFT HEART CATH AND CORONARY ANGIOGRAPHY;  Surgeon: Dolores Patty, MD;  Location: [facility] INVASIVE CV LAB;  Service: Cardiovascular;  Laterality: N/A;   TOE AMPUTATION Right    all toes on RT foot    Family History  Problem Relation Age of Onset   Diabetes Mother    Hypertension Mother     Social History:  reports that he has never smoked. He has never used smokeless tobacco. He reports that he does not drink alcohol and does not use drugs.  Allergies: No Known Allergies  Medications: I have reviewed the patient's current medications.  Results for orders placed or performed during the hospital encounter of 12/26/22 (from the past 48 hour(s))  CBG monitoring, ED     Status: Abnormal   Collection Time: 12/26/22 12:18 PM  Result Value Ref Range   Glucose-Capillary 193 (H) 70 - 99 mg/dL    Comment: Glucose reference range applies only to samples taken after fasting for at least 8 hours.  Basic metabolic panel     Status: Abnormal   Collection Time: 12/26/22  2:19 PM  Result Value Ref Range   Sodium 132 (L) 135 - 145 mmol/L   Potassium 4.7 3.5 - 5.1 mmol/L    Comment: HEMOLYSIS AT THIS LEVEL MAY AFFECT RESULT   Chloride 96 (L) 98 - 111 mmol/L    CO2 28 22 - 32 mmol/L   Glucose, Bld 201 (H) 70 - 99 mg/dL    Comment: Glucose reference range applies only to samples taken after fasting for at least 8 hours.   BUN 11 6 - 20 mg/dL   Creatinine, Ser 1.61 0.61 - 1.24 mg/dL   Calcium 7.8 (L) 8.9 - 10.3 mg/dL   GFR, Estimated >09 >60 mL/min    Comment: (NOTE) Calculated using the CKD-EPI Creatinine Equation (2021)    Anion gap 8 5 - 15    Comment: Performed at [facility], [address] [place], [place] [address]  CBC     Status: Abnormal   Collection Time: 12/26/22  2:19 PM  Result Value Ref Range   WBC 5.8 4.0 - 10.5 K/uL   RBC 4.64 4.22 - 5.81 MIL/uL   Hemoglobin 12.9 (L) 13.0 - 17.0 g/dL   HCT 81.1 91.4 - 78.2 %   MCV 89.9 80.0 - 100.0 fL   MCH 27.8 26.0 - 34.0 pg   MCHC 30.9 30.0 - 36.0 g/dL   RDW 95.6 (H) 21.3 - 08.6 %   Platelets 247 150 - 400 K/uL   nRBC 0.0 0.0 - 0.2 %    Comment: Performed at [facility], [address] [place], [place] [address]  Brain natriuretic peptide  Status: Abnormal   Collection Time: 12/26/22  2:19 PM  Result Value Ref Range   B Natriuretic Peptide 1,929.8 (H) 0.0 - 100.0 pg/mL    Comment: Performed at [facility], [address], [place], [place] [address]  Glucose, capillary     Status: Abnormal   Collection Time: 12/26/22  6:29 PM  Result Value Ref Range   Glucose-Capillary 192 (H) 70 - 99 mg/dL    Comment: Glucose reference range applies only to samples taken after fasting for at least 8 hours.  MRSA Next Gen by PCR, Nasal     Status: Abnormal   Collection Time: 12/26/22  6:35 PM   Specimen: Nasal Mucosa; Nasal Swab  Result Value Ref Range   MRSA by PCR Next Gen DETECTED (A) NOT DETECTED    Comment: RESULTS CALLED TO, READ BACK BY AND VERIFIED WITH RN A.ROBERTS ON 12/27/22 AT 0523 BY NM (NOTE) The GeneXpert MRSA Assay (FDA approved for NASAL specimens only), is one component of a comprehensive MRSA colonization surveillance program. It is not intended to  diagnose MRSA infection nor to guide or monitor treatment for MRSA infections. Test performance is not FDA approved in patients less than 15 years old. Performed at [facility], [address] [place], [place] [address]   Glucose, capillary     Status: Abnormal   Collection Time: 12/26/22  8:55 PM  Result Value Ref Range   Glucose-Capillary 224 (H) 70 - 99 mg/dL    Comment: Glucose reference range applies only to samples taken after fasting for at least 8 hours.   Comment 1 Notify RN    Comment 2 Document in Chart   Magnesium     Status: Abnormal   Collection Time: 12/26/22  9:13 PM  Result Value Ref Range   Magnesium 1.1 (L) 1.7 - 2.4 mg/dL    Comment: Performed at [facility], [address], [place], [place] [address]  HIV Antibody (routine testing w rflx)     Status: None   Collection Time: 12/27/22  5:43 AM  Result Value Ref Range   HIV Screen 4th Generation wRfx Non Reactive Non Reactive    Comment: Performed at [facility], [address] [place], [place] [address]  Basic metabolic panel     Status: Abnormal   Collection Time: 12/27/22  5:43 AM  Result Value Ref Range   Sodium 132 (L) 135 - 145 mmol/L   Potassium 4.0 3.5 - 5.1 mmol/L   Chloride 94 (L) 98 - 111 mmol/L   CO2 31 22 - 32 mmol/L   Glucose, Bld 188 (H) 70 - 99 mg/dL    Comment: Glucose reference range applies only to samples taken after fasting for at least 8 hours.   BUN 12 6 - 20 mg/dL   Creatinine, Ser 1.02 0.61 - 1.24 mg/dL   Calcium 7.7 (L) 8.9 - 10.3 mg/dL   GFR, Estimated >72 >53 mL/min    Comment: (NOTE) Calculated using the CKD-EPI Creatinine Equation (2021)    Anion gap 7 5 - 15    Comment: Performed at [facility], [address] [place], [place] [address]  Protime-INR     Status: Abnormal   Collection Time: 12/27/22  5:43 AM  Result Value Ref Range   Prothrombin Time 16.2 (H) 11.4 - 15.2 seconds   INR 1.3 (H) 0.8 - 1.2    Comment: (NOTE) INR goal varies based  on device and disease  states. Performed at [facility], [address], [place], [place] [address]   Gamma GT     Status: Abnormal   Collection Time: 12/27/22  5:43 AM  Result Value Ref Range   GGT 96 (H) 7 - 50 U/L    Comment: Performed at [facility], [address], [place], [place] [address]  Magnesium     Status: Abnormal   Collection Time: 12/27/22  5:43 AM  Result Value Ref Range   Magnesium 1.2 (L) 1.7 - 2.4 mg/dL    Comment: Performed at [facility], [address] [place], [place] [address]  Glucose, capillary     Status: Abnormal   Collection Time: 12/27/22  5:55 AM  Result Value Ref Range   Glucose-Capillary 170 (H) 70 - 99 mg/dL    Comment: Glucose reference range applies only to samples taken after fasting for at least 8 hours.   Comment 1 Notify RN    Comment 2 Document in Chart   Glucose, capillary     Status: Abnormal   Collection Time: 12/27/22 11:36 AM  Result Value Ref Range   Glucose-Capillary 193 (H) 70 - 99 mg/dL    Comment: Glucose reference range applies only to samples taken after fasting for at least 8 hours.  Glucose, capillary     Status: Abnormal   Collection Time: 12/27/22  4:24 PM  Result Value Ref Range   Glucose-Capillary 151 (H) 70 - 99 mg/dL    Comment: Glucose reference range applies only to samples taken after fasting for at least 8 hours.  Glucose, capillary     Status: Abnormal   Collection Time: 12/27/22  9:03 PM  Result Value Ref Range   Glucose-Capillary 142 (H) 70 - 99 mg/dL    Comment: Glucose reference range applies only to samples taken after fasting for at least 8 hours.  Basic metabolic panel     Status: Abnormal   Collection Time: 12/28/22 12:04 AM  Result Value Ref Range   Sodium 129 (L) 135 - 145 mmol/L   Potassium 3.7 3.5 - 5.1 mmol/L   Chloride 92 (L) 98 - 111 mmol/L   CO2 28 22 - 32 mmol/L   Glucose, Bld 176 (H) 70 - 99 mg/dL    Comment: Glucose reference range applies only to samples taken after  fasting for at least 8 hours.   BUN 15 6 - 20 mg/dL   Creatinine, Ser 2.84 0.61 - 1.24 mg/dL   Calcium 7.9 (L) 8.9 - 10.3 mg/dL   GFR, Estimated >13 >24 mL/min    Comment: (NOTE) Calculated using the CKD-EPI Creatinine Equation (2021)    Anion gap 9 5 - 15    Comment: Performed at [facility], [address], [place], [place] [address]  Magnesium     Status: None   Collection Time: 12/28/22 12:04 AM  Result Value Ref Range   Magnesium 1.8 1.7 - 2.4 mg/dL    Comment: Performed at [facility], [address], [place], [place] [address]  C-reactive protein     Status: Abnormal   Collection Time: 12/28/22 12:04 AM  Result Value Ref Range   CRP 5.6 (H) <1.0 mg/dL    Comment: Performed at [facility], [address], [place], [place] [address]  Glucose, capillary     Status: Abnormal   Collection Time: 12/28/22  6:14 AM  Result Value Ref Range   Glucose-Capillary 130 (H) 70 -  99 mg/dL    Comment: Glucose reference range applies only to samples taken after fasting for at least 8 hours.    ECHOCARDIOGRAM COMPLETE  Result Date: 12/27/2022    ECHOCARDIOGRAM REPORT   Patient Name:   Dakota Ramirez Date of Exam: 12/27/2022 Medical Rec #:  [id number]    Height:       71.0 in Accession #:    [id number]   Weight:       278.4 lb Date of Birth:  Jan 13, 1971    BSA:          2.427 m Patient Age:    51 years     BP:           107/89 mmHg Patient Gender: M            HR:           111 bpm. Exam Location:  Inpatient Procedure: 2D Echo, Cardiac Doppler, Color Doppler and Intracardiac            Opacification Agent Indications:    CHF - Acute Systolic  History:        Patient has prior history of Echocardiogram examinations, most                 recent 11/27/2021. CHF, PAD, Arrythmias:Tachycardia,                 Signs/Symptoms:Shortness of Breath and Dyspnea; Risk                 Factors:Diabetes and Hypertension.  Sonographer:    Wallie Char Referring Phys: [id number] Roxy Horseman SIMMONS   Sonographer Comments: Image acquisition challenging due to patient body habitus. IMPRESSIONS  1. Left ventricular ejection fraction, by estimation, is <20%. The left ventricle has severely decreased function. The left ventricle demonstrates global hypokinesis. The left ventricular internal cavity size was severely dilated. Left ventricular diastolic function could not be evaluated. Elevated left ventricular end-diastolic pressure.  2. Right ventricular systolic function is mildly reduced. The right ventricular size is severely enlarged.  3. Left atrial size was severely dilated.  4. Right atrial size was severely dilated.  5. The mitral valve is normal in structure. Mild to moderate mitral valve regurgitation. No evidence of mitral stenosis.  6. The aortic valve is tricuspid. Aortic valve regurgitation is not visualized. Aortic valve sclerosis/calcification is present, without any evidence of aortic stenosis. Aortic valve area, by VTI measures 2.64 cm. Aortic valve mean gradient measures 2.0 mmHg. Aortic valve Vmax measures 0.92 m/s.  7. The inferior vena cava is normal in size with greater than 50% respiratory variability, suggesting right atrial pressure of 3 mmHg. FINDINGS  Left Ventricle: Left ventricular ejection fraction, by estimation, is <20%. The left ventricle has severely decreased function. The left ventricle demonstrates global hypokinesis. Definity contrast agent was given IV to delineate the left ventricular endocardial borders. The left ventricular internal cavity size was severely dilated. There is no left ventricular hypertrophy. Left ventricular diastolic function could not be evaluated. Elevated left ventricular end-diastolic pressure. Right Ventricle: The right ventricular size is severely enlarged. No increase in right ventricular wall thickness. Right ventricular systolic function is mildly reduced. Left Atrium: Left atrial size was severely dilated. Right Atrium: Right atrial size was  severely dilated. Pericardium: Trivial pericardial effusion is present. The pericardial effusion is circumferential. Mitral Valve: The mitral valve is normal in structure. Mild mitral annular calcification. Mild to moderate mitral valve regurgitation, with eccentric posteriorly directed jet. No evidence  of mitral valve stenosis. MV peak gradient, 6.5 mmHg. The mean mitral valve gradient is 3.0 mmHg. Tricuspid Valve: The tricuspid valve is normal in structure. Tricuspid valve regurgitation is mild . No evidence of tricuspid stenosis. Aortic Valve: The aortic valve is tricuspid. Aortic valve regurgitation is not visualized. Aortic valve sclerosis/calcification is present, without any evidence of aortic stenosis. Aortic valve mean gradient measures 2.0 mmHg. Aortic valve peak gradient measures 3.4 mmHg. Aortic valve area, by VTI measures 2.64 cm. Pulmonic Valve: The pulmonic valve was normal in structure. Pulmonic valve regurgitation is trivial. No evidence of pulmonic stenosis. Aorta: The aortic root is normal in size and structure. Venous: The inferior vena cava is normal in size with greater than 50% respiratory variability, suggesting right atrial pressure of 3 mmHg. IAS/Shunts: No atrial level shunt detected by color flow Doppler.  LEFT VENTRICLE PLAX 2D LVIDd:         6.60 cm      Diastology LVIDs:         6.00 cm      LV e' medial:    6.68 cm/s LV PW:         1.20 cm      LV E/e' medial:  18.3 LV IVS:        0.60 cm      LV e' lateral:   9.47 cm/s LVOT diam:     1.70 cm      LV E/e' lateral: 12.9 LV SV:         33 LV SV Index:   14 LVOT Area:     2.27 cm  LV Volumes (MOD) LV vol d, MOD A2C: 296.0 ml LV vol d, MOD A4C: 273.0 ml LV vol s, MOD A2C: 238.0 ml LV vol s, MOD A4C: 221.0 ml LV SV MOD A2C:     58.0 ml LV SV MOD A4C:     273.0 ml LV SV MOD BP:      54.7 ml RIGHT VENTRICLE             IVC RV Basal diam:  5.90 cm     IVC diam: 2.50 cm RV S prime:     14.20 cm/s TAPSE (M-mode): 2.1 cm LEFT ATRIUM               Index        RIGHT ATRIUM           Index LA diam:        4.40 cm  1.81 cm/m   RA Area:     28.70 cm LA Vol (A2C):   107.0 ml 44.09 ml/m  RA Volume:   107.00 ml 44.09 ml/m LA Vol (A4C):   110.0 ml 45.33 ml/m LA Biplane Vol: 113.0 ml 46.56 ml/m  AORTIC VALVE AV Area (Vmax):    2.36 cm AV Area (Vmean):   2.30 cm AV Area (VTI):     2.64 cm AV Vmax:           92.00 cm/s AV Vmean:          67.700 cm/s AV VTI:            0.125 m AV Peak Grad:      3.4 mmHg AV Mean Grad:      2.0 mmHg LVOT Vmax:         95.80 cm/s LVOT Vmean:        68.567 cm/s LVOT VTI:  0.146 m LVOT/AV VTI ratio: 1.16  AORTA Ao Root diam: 2.80 cm Ao Asc diam:  3.60 cm MITRAL VALVE                  TRICUSPID VALVE MV Area (PHT): 6.90 cm       TR Peak grad:   37.0 mmHg MV Area VTI:   1.39 cm       TR Vmax:        304.00 cm/s MV Peak grad:  6.5 mmHg MV Mean grad:  3.0 mmHg       SHUNTS MV Vmax:       1.27 m/s       Systemic VTI:  0.15 m MV Vmean:      80.2 cm/s      Systemic Diam: 1.70 cm MV Decel Time: 110 msec MR Peak grad:    89.5 mmHg MR Mean grad:    58.0 mmHg MR Vmax:         473.00 cm/s MR Vmean:        359.0 cm/s MR PISA:         2.26 cm MR PISA Eff ROA: 15 mm MR PISA Radius:  0.60 cm MV E velocity: 122.00 cm/s Armanda Magic MD Electronically signed by Armanda Magic MD Signature Date/Time: 12/27/2022/3:21:18 PM    Final    US Abdomen Limited RUQ (LIVER/GB)  Result Date: 12/27/2022 CLINICAL DATA:  [id number] Alkaline phosphatase elevation [id number] EXAM: ULTRASOUND ABDOMEN LIMITED RIGHT UPPER QUADRANT COMPARISON:  CT 05/05/2018. FINDINGS: Gallbladder: Surgically absent. Common bile duct: Diameter: 4.1 mm, normal.  No intrahepatic ductal dilation. Liver: Mildly heterogeneous and coarsened echotexture with preserved portal triads and mild surface nodularity. Portal vein is patent on color Doppler imaging with normal direction of blood flow towards the liver. Other: Small right pleural effusion. IMPRESSION: No evidence of biliary  obstruction.  Prior cholecystectomy. Mildly heterogeneous liver parenchyma with coarse echotexture and mild surface nodularity, possibly due to hepatic steatosis and/or fibrosis, similar to prior exam. Small right pleural effusion. Electronically Signed   By: Caprice Renshaw M.D.   On: 12/27/2022 14:57   DG Foot 2 Views Left  Result Date: 12/27/2022 CLINICAL DATA:  [id number] Foot ulcer (HCC) [id number] EXAM: LEFT FOOT - 2 VIEW COMPARISON:  August 29, 2008. FINDINGS: No acute fracture or dislocation. Scattered degenerative changes of the midfoot. No unexpected radiopaque foreign body. Soft tissue defect of the heel and great toe. There is erosive change of the distal first phalanx IMPRESSION: 1. Soft tissue defect of the great toe with underlying erosive change likely reflecting osteomyelitis. 2. Soft tissue defect of the heel without radiographic evidence of underlying osteomyelitis. Electronically Signed   By: Meda Klinefelter M.D.   On: 12/27/2022 14:13   DG Chest 2 View  Result Date: 12/26/2022 CLINICAL DATA:  Shortness of breath. Do not take insulin this morning. Weakness and lethargy. EXAM: CHEST - 2 VIEW COMPARISON:  11/26/2021 and CT chest 11/26/2021. FINDINGS: Trachea is midline. Heart is enlarged. Lungs are clear. No pleural fluid. Old left rib fractures. IMPRESSION: No acute findings. Electronically Signed   By: Leanna Battles M.D.   On: 12/26/2022 13:14    Review of Systems  HENT:  Negative for ear discharge, ear pain, hearing loss and tinnitus.   Eyes:  Negative for photophobia and pain.  Respiratory:  Negative for cough and shortness of breath.   Cardiovascular:  Negative for chest pain.  Gastrointestinal:  Negative for abdominal pain, nausea and vomiting.  Genitourinary:  Negative for dysuria, flank pain, frequency and urgency.  Musculoskeletal:  Negative for arthralgias, back pain, myalgias and neck pain.  Neurological:  Negative for dizziness and headaches.  Hematological:  Does not  bruise/bleed easily.  Psychiatric/Behavioral:  The patient is not nervous/anxious.    Blood pressure 123/88, pulse (!) 113, temperature 97.7 F (36.5 C), temperature source Oral, resp. rate 17, height 5\' 11"  (1.803 m), weight 121.2 kg, SpO2 97 %. Physical Exam Constitutional:      General: He is not in acute distress.    Appearance: He is well-developed. He is not diaphoretic.  HENT:     Head: Normocephalic and atraumatic.  Eyes:     General: No scleral icterus.       Right eye: No discharge.        Left eye: No discharge.     Conjunctiva/sclera: Conjunctivae normal.  Cardiovascular:     Rate and Rhythm: Normal rate and regular rhythm.  Pulmonary:     Effort: Pulmonary effort is normal. No respiratory distress.  Musculoskeletal:     Cervical back: Normal range of motion.  Feet:     Comments: Right foot -- s/p TMT amp, DP/PT 0, stump tissue ragged, SPN/DPN/TN paresthetic  Left foot -- Ulcerations great toe and plantar heel, DP/PT 0, SPN/DPN/TN paresthetic, EHL 5/5 Skin:    General: Skin is warm and dry.  Neurological:     Mental Status: He is alert.  Psychiatric:        Mood and Affect: Mood normal.        Behavior: Behavior normal.     Assessment/Plan: Left foot osteo -- Agree with ABI's. If abnormal, as I suspect they will be, will need vascular surgery consult. Dr. Lajoyce Corners to see later today.    Freeman Caldron, PA-C Orthopedic Surgery [phone] 12/28/2022, 10:30 AM

## 2022-12-28 NOTE — Progress Notes (Addendum)
Advanced Heart Failure Rounding Note  PCP-Cardiologist: Dr. Gala Romney    Subjective:    5/15: admitted for a/c CHF w/ volume overload, initial BNP 2,409.   Good response to IV Lasix, -5L UOP. Wt down 11lbs overnight, overall down 26lbs. SCr stable .98, K 3.7. Mag 1.2>1.8, repleted yesterday  Echo yesterday 5/24: EF <20%, LV with GHK, RV mildly reduced, LA/RA sev dilated, mild-mod MR, mild TR, trivial pericardial effusion  Feels fine just ready to go back home. Denies CP/SOB. Tired of being in his room. Encouraged to ambulate.   Objective:   Weight Range: 121.2 kg Body mass index is 37.27 kg/m.   Vital Signs:   Temp:  [97.7 F (36.5 C)-98.6 F (37 C)] 98.3 F (36.8 C) (05/17 0445) Pulse Rate:  [98-116] 116 (05/17 0445) Resp:  [18-19] 18 (05/17 0445) BP: (107-125)/(85-99) 123/85 (05/17 0445) SpO2:  [97 %-98 %] 98 % (05/16 1952) Weight:  [121.2 kg] 121.2 kg (05/17 0445) Last BM Date : 12/27/22  Weight change: Filed Weights   12/26/22 1831 12/27/22 0600 12/28/22 0445  Weight: 128.8 kg 126.3 kg 121.2 kg    Intake/Output:   Intake/Output Summary (Last 24 hours) at 12/28/2022 0741 Last data filed at 12/28/2022 0624 Gross per 24 hour  Intake 1230 ml  Output 5075 ml  Net -3845 ml      Physical Exam  General:  well appearing.  No respiratory difficulty HEENT: normal Neck: supple. JVD to jaw. Carotids 2+ bilat; no bruits. No lymphadenopathy or thyromegaly appreciated. Cor: PMI nondisplaced. Tachy rate & rhythm. No rubs, gallops. + murmur. Lungs: clear Abdomen: soft, nontender, nondistended. No hepatosplenomegaly. No bruits or masses. Good bowel sounds. Extremities: no cyanosis, clubbing, rash, trace BLE edema. chronic venous stasis dermatitis and LE wounds (wrapped) Neuro: alert & oriented x 3, cranial nerves grossly intact. moves all 4 extremities w/o difficulty. Affect pleasant.   Telemetry   ST 100s. Brief 5 beat NSVT (Personally reviewed)    EKG    No new  EKG to review   Labs    CBC Recent Labs    12/26/22 1132 12/26/22 1419  WBC 5.8 5.8  HGB 13.4 12.9*  HCT 44.2 41.7  MCV 88.6 89.9  PLT 238 247   Basic Metabolic Panel Recent Labs    16/10/96 0543 12/28/22 0004  NA 132* 129*  K 4.0 3.7  CL 94* 92*  CO2 31 28  GLUCOSE 188* 176*  BUN 12 15  CREATININE 1.01 0.98  CALCIUM 7.7* 7.9*  MG 1.2* 1.8   Liver Function Tests Recent Labs    12/26/22 1132  AST 31  ALT 20  ALKPHOS 208*  BILITOT 1.0  PROT 8.8*  ALBUMIN 1.8*   No results for input(s): "LIPASE", "AMYLASE" in the last 72 hours. Cardiac Enzymes No results for input(s): "CKTOTAL", "CKMB", "CKMBINDEX", "TROPONINI" in the last 72 hours.  BNP: BNP (last 3 results) Recent Labs    09/04/22 1350 12/26/22 1132 12/26/22 1419  BNP 622.6* 2,409.9* 1,929.8*    ProBNP (last 3 results) No results for input(s): "PROBNP" in the last 8760 hours.   D-Dimer No results for input(s): "DDIMER" in the last 72 hours. Hemoglobin A1C Recent Labs    12/26/22 1132  HGBA1C 11.2*   Fasting Lipid Panel No results for input(s): "CHOL", "HDL", "LDLCALC", "TRIG", "CHOLHDL", "LDLDIRECT" in the last 72 hours. Thyroid Function Tests No results for input(s): "TSH", "T4TOTAL", "T3FREE", "THYROIDAB" in the last 72 hours.  Invalid input(s): "FREET3"  Other results:  Imaging    ECHOCARDIOGRAM COMPLETE  Result Date: 12/27/2022    ECHOCARDIOGRAM REPORT   Patient Name:   Dakota Ramirez Date of Exam: 12/27/2022 Medical Rec #:  [id number]    Height:       71.0 in Accession #:    [id number]   Weight:       278.4 lb Date of Birth:  1971/07/07    BSA:          2.427 m Patient Age:    52 years     BP:           107/89 mmHg Patient Gender: M            HR:           111 bpm. Exam Location:  Inpatient Procedure: 2D Echo, Cardiac Doppler, Color Doppler and Intracardiac            Opacification Agent Indications:    CHF - Acute Systolic  History:        Patient has prior history of Echocardiogram  examinations, most                 recent 11/27/2021. CHF, PAD, Arrythmias:Tachycardia,                 Signs/Symptoms:Shortness of Breath and Dyspnea; Risk                 Factors:Diabetes and Hypertension.  Sonographer:    Wallie Char Referring Phys: [id number] Roxy Horseman SIMMONS  Sonographer Comments: Image acquisition challenging due to patient body habitus. IMPRESSIONS  1. Left ventricular ejection fraction, by estimation, is <20%. The left ventricle has severely decreased function. The left ventricle demonstrates global hypokinesis. The left ventricular internal cavity size was severely dilated. Left ventricular diastolic function could not be evaluated. Elevated left ventricular end-diastolic pressure.  2. Right ventricular systolic function is mildly reduced. The right ventricular size is severely enlarged.  3. Left atrial size was severely dilated.  4. Right atrial size was severely dilated.  5. The mitral valve is normal in structure. Mild to moderate mitral valve regurgitation. No evidence of mitral stenosis.  6. The aortic valve is tricuspid. Aortic valve regurgitation is not visualized. Aortic valve sclerosis/calcification is present, without any evidence of aortic stenosis. Aortic valve area, by VTI measures 2.64 cm. Aortic valve mean gradient measures 2.0 mmHg. Aortic valve Vmax measures 0.92 m/s.  7. The inferior vena cava is normal in size with greater than 50% respiratory variability, suggesting right atrial pressure of 3 mmHg. FINDINGS  Left Ventricle: Left ventricular ejection fraction, by estimation, is <20%. The left ventricle has severely decreased function. The left ventricle demonstrates global hypokinesis. Definity contrast agent was given IV to delineate the left ventricular endocardial borders. The left ventricular internal cavity size was severely dilated. There is no left ventricular hypertrophy. Left ventricular diastolic function could not be evaluated. Elevated left ventricular  end-diastolic pressure. Right Ventricle: The right ventricular size is severely enlarged. No increase in right ventricular wall thickness. Right ventricular systolic function is mildly reduced. Left Atrium: Left atrial size was severely dilated. Right Atrium: Right atrial size was severely dilated. Pericardium: Trivial pericardial effusion is present. The pericardial effusion is circumferential. Mitral Valve: The mitral valve is normal in structure. Mild mitral annular calcification. Mild to moderate mitral valve regurgitation, with eccentric posteriorly directed jet. No evidence of mitral valve stenosis. MV peak gradient, 6.5 mmHg. The mean mitral valve gradient is 3.0 mmHg. Tricuspid Valve: The  tricuspid valve is normal in structure. Tricuspid valve regurgitation is mild . No evidence of tricuspid stenosis. Aortic Valve: The aortic valve is tricuspid. Aortic valve regurgitation is not visualized. Aortic valve sclerosis/calcification is present, without any evidence of aortic stenosis. Aortic valve mean gradient measures 2.0 mmHg. Aortic valve peak gradient measures 3.4 mmHg. Aortic valve area, by VTI measures 2.64 cm. Pulmonic Valve: The pulmonic valve was normal in structure. Pulmonic valve regurgitation is trivial. No evidence of pulmonic stenosis. Aorta: The aortic root is normal in size and structure. Venous: The inferior vena cava is normal in size with greater than 50% respiratory variability, suggesting right atrial pressure of 3 mmHg. IAS/Shunts: No atrial level shunt detected by color flow Doppler.  LEFT VENTRICLE PLAX 2D LVIDd:         6.60 cm      Diastology LVIDs:         6.00 cm      LV e' medial:    6.68 cm/s LV PW:         1.20 cm      LV E/e' medial:  18.3 LV IVS:        0.60 cm      LV e' lateral:   9.47 cm/s LVOT diam:     1.70 cm      LV E/e' lateral: 12.9 LV SV:         33 LV SV Index:   14 LVOT Area:     2.27 cm  LV Volumes (MOD) LV vol d, MOD A2C: 296.0 ml LV vol d, MOD A4C: 273.0 ml LV vol  s, MOD A2C: 238.0 ml LV vol s, MOD A4C: 221.0 ml LV SV MOD A2C:     58.0 ml LV SV MOD A4C:     273.0 ml LV SV MOD BP:      54.7 ml RIGHT VENTRICLE             IVC RV Basal diam:  5.90 cm     IVC diam: 2.50 cm RV S prime:     14.20 cm/s TAPSE (M-mode): 2.1 cm LEFT ATRIUM              Index        RIGHT ATRIUM           Index LA diam:        4.40 cm  1.81 cm/m   RA Area:     28.70 cm LA Vol (A2C):   107.0 ml 44.09 ml/m  RA Volume:   107.00 ml 44.09 ml/m LA Vol (A4C):   110.0 ml 45.33 ml/m LA Biplane Vol: 113.0 ml 46.56 ml/m  AORTIC VALVE AV Area (Vmax):    2.36 cm AV Area (Vmean):   2.30 cm AV Area (VTI):     2.64 cm AV Vmax:           92.00 cm/s AV Vmean:          67.700 cm/s AV VTI:            0.125 m AV Peak Grad:      3.4 mmHg AV Mean Grad:      2.0 mmHg LVOT Vmax:         95.80 cm/s LVOT Vmean:        68.567 cm/s LVOT VTI:          0.146 m LVOT/AV VTI ratio: 1.16  AORTA Ao Root diam: 2.80 cm Ao Asc diam:  3.60 cm MITRAL  VALVE                  TRICUSPID VALVE MV Area (PHT): 6.90 cm       TR Peak grad:   37.0 mmHg MV Area VTI:   1.39 cm       TR Vmax:        304.00 cm/s MV Peak grad:  6.5 mmHg MV Mean grad:  3.0 mmHg       SHUNTS MV Vmax:       1.27 m/s       Systemic VTI:  0.15 m MV Vmean:      80.2 cm/s      Systemic Diam: 1.70 cm MV Decel Time: 110 msec MR Peak grad:    89.5 mmHg MR Mean grad:    58.0 mmHg MR Vmax:         473.00 cm/s MR Vmean:        359.0 cm/s MR PISA:         2.26 cm MR PISA Eff ROA: 15 mm MR PISA Radius:  0.60 cm MV E velocity: 122.00 cm/s Armanda Magic MD Electronically signed by Armanda Magic MD Signature Date/Time: 12/27/2022/3:21:18 PM    Final    US Abdomen Limited RUQ (LIVER/GB)  Result Date: 12/27/2022 CLINICAL DATA:  [id number] Alkaline phosphatase elevation [id number] EXAM: ULTRASOUND ABDOMEN LIMITED RIGHT UPPER QUADRANT COMPARISON:  CT 05/05/2018. FINDINGS: Gallbladder: Surgically absent. Common bile duct: Diameter: 4.1 mm, normal.  No intrahepatic ductal dilation. Liver:  Mildly heterogeneous and coarsened echotexture with preserved portal triads and mild surface nodularity. Portal vein is patent on color Doppler imaging with normal direction of blood flow towards the liver. Other: Small right pleural effusion. IMPRESSION: No evidence of biliary obstruction.  Prior cholecystectomy. Mildly heterogeneous liver parenchyma with coarse echotexture and mild surface nodularity, possibly due to hepatic steatosis and/or fibrosis, similar to prior exam. Small right pleural effusion. Electronically Signed   By: Caprice Renshaw M.D.   On: 12/27/2022 14:57   DG Foot 2 Views Left  Result Date: 12/27/2022 CLINICAL DATA:  [id number] Foot ulcer (HCC) [id number] EXAM: LEFT FOOT - 2 VIEW COMPARISON:  August 29, 2008. FINDINGS: No acute fracture or dislocation. Scattered degenerative changes of the midfoot. No unexpected radiopaque foreign body. Soft tissue defect of the heel and great toe. There is erosive change of the distal first phalanx IMPRESSION: 1. Soft tissue defect of the great toe with underlying erosive change likely reflecting osteomyelitis. 2. Soft tissue defect of the heel without radiographic evidence of underlying osteomyelitis. Electronically Signed   By: Meda Klinefelter M.D.   On: 12/27/2022 14:13     Medications:     Scheduled Medications:  acetaZOLAMIDE  500 mg Intravenous Once   Chlorhexidine Gluconate Cloth  6 each Topical Q0600   digoxin  0.125 mg Oral Daily   enoxaparin (LOVENOX) injection  60 mg Subcutaneous Q24H   furosemide  80 mg Intravenous BID   insulin aspart  0-20 Units Subcutaneous TID WC   insulin glargine-yfgn  20 Units Subcutaneous QHS   melatonin  3 mg Oral QHS   mupirocin ointment  1 Application Nasal BID   potassium chloride  40 mEq Oral Once   sacubitril-valsartan  1 tablet Oral BID   sodium chloride flush  3 mL Intravenous Q12H   spironolactone  25 mg Oral Daily    Infusions:  sodium chloride     magnesium sulfate bolus IVPB      PRN  Medications: sodium  chloride, acetaminophen, camphor-menthol, sodium chloride flush    Patient Profile   52 y/o male w/ h/o 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   Assessment/Plan  1. Acute on Chronic Systolic Heart Failure - NICM. Reports hx CHF in his mother. No hx alcohol or drug abuse. 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 RVEF 22% LGE imaging very poor quality, which limits interpretation. Appears to have RV insertion site LGE (associated with worse prognosis) - NYHA IIIb. Admitted w/ volume overload, in setting of poor med compliance/reported inability to afford meds. Minimal improvement in up titration of home Lasix regimen. Up ~40 lb from previous discharge wt. Initial ReDs 42%  - responding well to IV Lasix. Continue Lasix 80 mg bid. SCr stable - Continue Entresto 49/51 mg bid. - Continue digoxin 0.125 mg daily.   - Continue spiro 25 mg daily. - Hold off on ? blocker until fully diuresed  - No SGLT2i with uncontrolled DM and concern for hygiene. - Daily wts, strict I/Os, low sodium/carb modified diet w/ fluid restriction < 1800 ml  - Echo yesterday 5/24: EF <20%, LV with GHK, RV mildly reduced, LA/RA sev dilated, mild-mod MR, mild TR, trivial pericardial effusion   2. Uncontrolled DM2: - poorly controlled, Hgb A1c 11.2  - reports he has been off insulin for several days, unable to afford refills  - Insulin per primary team    3. Hx osteomyelitis s/p right TMA: - 2/2 poorly controlled DM  - currently w/ several  LE wounds, primary team to manage    4. HTN: - elevated, in setting of missed meds + volume overload - Restart GDMT + diuresis per above  - improving   5. Hypoalbuminemia: - Admit Albumin 1.8  - Prior Liver US with echogenic parenchyma, ? Hepatic steatosis or fibrosis. Cannot exclude cirrhosis. - hepC antibody NR  -? Secondary to RV failure - Management per primary team   6. Hypomagnesemia - Mg 1.2>1.8. repleted yesterday   7. SDOH:  - Now insured. - Lives in a camper behind father's house. Unemployed and has limited social support. - Needs Paramedicine but he has refused, HFSW helping.   Needs meds sent to [facility] pharmacy today if theres a chance he may d/c over the weekend.   Length of Stay: 2  Alen Bleacher, NP  12/28/2022, 7:41 AM  Advanced Heart Failure Team Pager [phone] (M-F; 7a - 5p)  Please contact [facility] for night-coverage after hours (5p -7a ) and weekends on [email or web address]  Patient seen with NP, agree with the above note.   Excellent diuresis yesterday, weight coming down. Creatinine stable. Breathing better.   General: NAD Neck: JVP 12-14 cm, no thyromegaly or thyroid nodule.  Lungs: Clear to auscultation bilaterally with normal respiratory effort. CV: Nondisplaced PMI.  Heart mildly tachy, regular S1/S2, no S3/S4, no murmur.  1+ ankle edema.  Unable to palpate pedal pulses.  Abdomen: Soft, nontender, no hepatosplenomegaly, no distention.  Skin: Intact without lesions or rashes.  Neurologic: Alert and oriented x 3.  Psych: Normal affect. Extremities: No clubbing or  cyanosis. Bilateral pedal ulcerations with right TMA HEENT: Normal.   Still volume overloaded on exam.  - Continue Lasix 80 mg IV bid, has responded well.  - Increase Entresto to 97/103 bid.  - Add beta blocker when diuresed.   Bilateral pedal ulcerations, s/p right TMA.   - Getting ABIs today.  - Dr. Lajoyce Corners to see, may need vascular consult.   Marca Ancona 12/28/2022 11:28 AM

## 2022-12-28 NOTE — Research (Signed)
FASTR Informed Consent   Subject Name: Dakota Ramirez  Subject met inclusion and exclusion criteria.  The informed consent form, study requirements and expectations were reviewed with the subject and questions and concerns were addressed prior to the signing of the consent form.  The subject verbalized understanding of the trail requirements.  The subject agreed to participate in the FASTR trial and signed the informed consent.  The informed consent was obtained prior to performance of any protocol-specific procedures for the subject.  A copy of the signed informed consent was given to the subject and a copy was placed in the subject's medical record.  PATIENT IS PART OF REGISTRY FOR FASTR TRIAL   Mercer Pod D 12/27/2022 1155 AM

## 2022-12-28 NOTE — Inpatient Diabetes Management (Signed)
Inpatient Diabetes Program Recommendations  AACE/ADA: New Consensus Statement on Inpatient Glycemic Control (2015)  Target Ranges:  Prepandial:   less than 140 mg/dL      Peak postprandial:   less than 180 mg/dL (1-2 hours)      Critically ill patients:  140 - 180 mg/dL   Lab Results  Component Value Date   GLUCAP 146 (H) 12/28/2022   HGBA1C 11.2 (H) 12/26/2022   Note: Spoke with patient via bedside and reviewed A1c of 11.2 (average blood glucose 275 over the past 2-3 months). Patient states he has insulin @ home in the refrigerated and can afford his insulin, but has not been taking regularly. Patient shared that his A1c has improved from >17 in the past. Reviewed basic nutrition plate method along with reduction of high sugary drinks and carbohydrate foods.  Thank you, Dakota Ramirez. Blakelyn Dinges, RN, MSN, CDE  Diabetes Coordinator Inpatient Glycemic Control Team Team Pager [phone] (8am-5pm) 12/28/2022 2:37 PM

## 2022-12-28 NOTE — Evaluation (Signed)
Physical Therapy Evaluation Patient Details Name: Dakota Ramirez MRN: [id number] DOB: Aug 22, 1970 Today's Date: 12/28/2022  History of Present Illness  52 yo male admitted 5/15 with decompensated CHF. PMhx: CHF, DM, PVD, NICM, Rt transmetatarsal amp  Clinical Impression  Pt with limited interest in participation in therapy Evaluation. Tearful about situation and news of possible L great toe amputation. Pt only willing for mobility when he reports he needs to urinate prior to Orthopedic PA examination. Pt is mod I for all mobility and has poor safety awareness due to his frustration. Pt likely close to his baseline level of function and unwilling to participate in therapy. Pt has no equipment needs at this time. PT will discharge from caseload.        Recommendations for follow up therapy are one component of a multi-disciplinary discharge planning process, led by the attending physician.  Recommendations may be updated based on patient status, additional functional criteria and insurance authorization.     Assistance Recommended at Discharge PRN  Patient can return home with the following  Assist for transportation    Equipment Recommendations None recommended by PT     Functional Status Assessment Patient has had a recent decline in their functional status and/or demonstrates limited ability to make significant improvements in function in a reasonable and predictable amount of time     Precautions / Restrictions Precautions Precautions: Fall Restrictions Weight Bearing Restrictions: No      Mobility  Bed Mobility Overal bed mobility: Modified Independent             General bed mobility comments: HoB elevated,    Transfers Overall transfer level: Modified independent Equipment used: None               General transfer comment: only gets up because he needs to urinate, only gets up because he needs to urinate prior to Orthopedic PA performing examination of B feet     Ambulation/Gait Ambulation/Gait assistance: Modified independent (Device/Increase time) Gait Distance (Feet): 8 Feet Assistive device: None Gait Pattern/deviations: Step-through pattern, Antalgic, Wide base of support Gait velocity: too fast for conditions Gait velocity interpretation: <1.8 ft/sec, indicate of risk for recurrent falls   General Gait Details: use of foot of bed to steady with ambulation to bathroom, refuses grip socks, and gait belt or close proximity of PT        Balance Overall balance assessment: Mild deficits observed, not formally tested                                           Pertinent Vitals/Pain Pain Assessment Pain Assessment: Faces Faces Pain Scale: Hurts little more Pain Location: with ambulation Pain Descriptors / Indicators: Grimacing, Moaning Pain Intervention(s): Limited activity within patient's tolerance, Monitored during session, Repositioned    Home Living Family/patient expects to be discharged to:: Private residence Living Arrangements: Parent Available Help at Discharge: Family;Available PRN/intermittently Type of Home: Other(Comment) (camper behind his father's property)         Home Layout: One level Home Equipment: Cane - single point Additional Comments: Pt lives in camper without stairs, no water, sponge bathes, pees outside and BM in a bucket.    Prior Function Prior Level of Function : Independent/Modified Independent             Mobility Comments: independent no AD, May use SPC when exacerbation of SOB and  fluid ADLs Comments: pt reports independence in bADLs/iADLs. sponge bathes and typically has slip on shoes        Extremity/Trunk Assessment        Lower Extremity Assessment Lower Extremity Assessment: RLE deficits/detail;LLE deficits/detail RLE Deficits / Details: Transmetatarsal amputation, drainage noted on bottom of white tube sock over gauze dressing LLE Deficits / Details: uncared  for wound on great toe, dressed and covered with tube sock, no bleed through drainage noted    Cervical / Trunk Assessment Cervical / Trunk Assessment: Normal  Communication   Communication: No difficulties  Cognition Arousal/Alertness: Awake/alert Behavior During Therapy: WFL for tasks assessed/performed Overall Cognitive Status: Within Functional Limits for tasks assessed                                          General Comments General comments (skin integrity, edema, etc.): HR 112 at rest, tearful about possible loss of L great toe, very frustrated with situation, able to repeat back actions needed to control wound progression and CHF but reports unwilling to perform, but then also bemoans how unfair all of this is        Assessment/Plan    PT Assessment Patient does not need any further PT services         PT Goals (Current goals can be found in the Care Plan section)  Acute Rehab PT Goals Patient Stated Goal: go fishing in his boat PT Goal Formulation: All assessment and education complete, DC therapy     AM-PAC PT "6 Clicks" Mobility  Outcome Measure Help needed turning from your back to your side while in a flat bed without using bedrails?: None Help needed moving from lying on your back to sitting on the side of a flat bed without using bedrails?: None Help needed moving to and from a bed to a chair (including a wheelchair)?: None Help needed standing up from a chair using your arms (e.g., wheelchair or bedside chair)?: None Help needed to walk in hospital room?: None Help needed climbing 3-5 steps with a railing? : A Little 6 Click Score: 23    End of Session   Activity Tolerance: Patient limited by pain;Other (comment) (frustration with situation) Patient left: in bed;Other (comment) (Orthopedic PA in room) Nurse Communication: Mobility status PT Visit Diagnosis: Difficulty in walking, not elsewhere classified (R26.2);Pain;Unsteadiness on feet  (R26.81);Other abnormalities of gait and mobility (R26.89)    Time: 1610-9604 PT Time Calculation (min) (ACUTE ONLY): 25 min   Charges:   PT Evaluation $PT Eval Low Complexity: 1 Low PT Treatments $Therapeutic Activity: 8-22 mins        Terris Germano B. Beverely Risen PT, DPT Acute Rehabilitation Services Please use secure chat or  Call Office [phone]   Elon Alas [facility] 12/28/2022, 10:41 AM

## 2022-12-28 NOTE — Progress Notes (Signed)
ABI's have been completed. Preliminary results can be found in CV Proc through chart review.   12/28/22 11:56 AM Olen Cordial RVT

## 2022-12-28 NOTE — Plan of Care (Signed)
  Problem: Fluid Volume: Goal: Ability to maintain a balanced intake and output will improve Outcome: Progressing   Problem: Activity: Goal: Capacity to carry out activities will improve Outcome: Progressing   Problem: Clinical Measurements: Goal: Diagnostic test results will improve Outcome: Progressing Goal: Respiratory complications will improve Outcome: Progressing Goal: Cardiovascular complication will be avoided Outcome: Progressing   Problem: Coping: Goal: Level of anxiety will decrease Outcome: Progressing   Problem: Elimination: Goal: Will not experience complications related to urinary retention Outcome: Progressing   Problem: Pain Managment: Goal: General experience of comfort will improve Outcome: Progressing   Problem: Safety: Goal: Ability to remain free from injury will improve Outcome: Progressing

## 2022-12-28 NOTE — Progress Notes (Addendum)
                 Interval history Breathing continues to feel better.  Legs feel less tight.  Abdominal swelling is decreasing.  Disappointed to hear about the findings concerning for osteomyelitis of the left toe.  Physical exam Blood pressure 123/88, pulse (!) 113, temperature 97.7 F (36.5 C), temperature source Oral, resp. rate 17, height 5\' 11"  (1.803 m), weight 121.2 kg, SpO2 97 %.  No apparent distress Heart rate is tachycardic, rhythm is regular, right radial pulses strong, lower extremity edema, jugular venous pulsations at angle of mandible, extremities appear well-perfused Breathing is regular nonlabored, decreased crackles left lung, basilar crackles still on the right Skin is warm and dry Alert and oriented Affect is dejected  Weight change: -12 kg   Intake/Output Summary (Last 24 hours) at 12/28/2022 1139 Last data filed at 12/28/2022 1000 Gross per 24 hour  Intake 1526.74 ml  Output 5475 ml  Net -3948.26 ml   Net IO Since Admission: -8,315.26 mL [12/28/22 1139]  Labs, images, and other studies Sodium 129 Potassium 3.7 Bicarb 28 Creatinine 0.98 Magnesium 1.8  CRP 5.6  Left foot x-ray with great toe ulceration and erosive changes concerning for osteomyelitis  Assessment and plan Hospital day 2  Dakota Ramirez is a 52 y.o. admitted for acute decompensated biventricular heart failure also with imaging findings concerning for osteomyelitis of the left great toe.  Principal Problem:   Acute on chronic heart failure (HCC) Active Problems:   Type 2 diabetes mellitus (HCC)   Elevated alkaline phosphatase level   Diabetic osteomyelitis (HCC)  Acute on Chronic Decompensated biventricular heart failure Continues to diurese well.  -7.5 L since admission.  Tachycardic but normotensive and well-perfused.  Advanced heart failure team following.  Continue IV diuresis.  Increase Entresto.  Add beta-blocker when volume status optimized. - Entresto 200 twice daily -  Furosemide 80 mg IV twice daily - Acetazolamide 500 mg IV - Spironolactone 25 mg daily - Digoxin 0.125 mg daily  Concern for osteomyelitis Left foot x-ray with erosive changes consistent with osteomyelitis of the left great toe.  No signs of calcaneal osteomyelitis on x-ray.  Will get MRI of the left foot.  ABIs bilateral pending.  Appreciate Ortho evaluation.  May need vascular pending results of ABIs. - Follow-up MRI and ABIs  Poorly controlled type 2 diabetes with hyperglycemia Blood sugar well-controlled on current regimen. - Glargine 20 units daily - SSI  Hypomagnesemia Hypokalemia Aggressive electrolyte monitoring and replacement while diuresing.  Diet: Heart healthy with 1500 mL fluid restriction IVF: None VTE: Enoxaparin 60 mg Code: DNR PT/OT recommendations: No follow-up necessary  Marrianne Mood MD 12/28/2022, 11:39 AM  Pager: [phone] After 5pm or weekend: [phone]

## 2022-12-28 NOTE — TOC Transition Note (Signed)
Discharge medications (1) are being stored in the Transitions of Care (TOC) Pharmacy on the second floor until patient is ready for discharge.    

## 2022-12-29 DIAGNOSIS — M8618 Other acute osteomyelitis, other site: Secondary | ICD-10-CM

## 2022-12-29 DIAGNOSIS — E11621 Type 2 diabetes mellitus with foot ulcer: Secondary | ICD-10-CM

## 2022-12-29 DIAGNOSIS — E1169 Type 2 diabetes mellitus with other specified complication: Secondary | ICD-10-CM

## 2022-12-29 DIAGNOSIS — I5023 Acute on chronic systolic (congestive) heart failure: Secondary | ICD-10-CM | POA: Diagnosis not present

## 2022-12-29 LAB — BASIC METABOLIC PANEL
Anion gap: 9 (ref 5–15)
BUN: 19 mg/dL (ref 6–20)
CO2: 27 mmol/L (ref 22–32)
Calcium: 8 mg/dL — ABNORMAL LOW (ref 8.9–10.3)
Chloride: 96 mmol/L — ABNORMAL LOW (ref 98–111)
Creatinine, Ser: 1.08 mg/dL (ref 0.61–1.24)
GFR, Estimated: >60 mL/min (ref >60)
Glucose, Bld: 204 mg/dL — ABNORMAL HIGH (ref 70–99)
Potassium: 3.5 mmol/L (ref 3.5–5.1)
Sodium: 132 mmol/L — ABNORMAL LOW (ref 135–145)

## 2022-12-29 LAB — GLUCOSE, CAPILLARY: Glucose-Capillary: 194 mg/dL — ABNORMAL HIGH (ref 70–99)

## 2022-12-29 LAB — MAGNESIUM: Magnesium: 1.4 mg/dL — ABNORMAL LOW (ref 1.7–2.4)

## 2022-12-29 MED ORDER — POTASSIUM CHLORIDE 20 MEQ PO PACK
40.0000 meq | PACK | Freq: Two times a day (BID) | ORAL | Status: DC
  Filled 2022-12-29: qty 2

## 2022-12-29 MED ORDER — INSULIN GLARGINE-YFGN 100 UNIT/ML ~~LOC~~ SOLN
22.0000 [IU] | Freq: Every day | SUBCUTANEOUS | Status: DC
  Filled 2022-12-29: qty 0.22

## 2022-12-29 MED ORDER — AMOXICILLIN-POT CLAVULANATE 875-125 MG PO TABS: 1.0000 | ORAL_TABLET | Freq: Two times a day (BID) | ORAL | 0 refills | Status: AC

## 2022-12-29 MED ORDER — MAGNESIUM SULFATE 2 GM/50ML IV SOLN
2.0000 g | Freq: Once | INTRAVENOUS | Status: AC
  Administered 2022-12-29: 2 g via INTRAVENOUS
  Filled 2022-12-29: qty 50

## 2022-12-29 MED ORDER — CARVEDILOL 3.125 MG PO TABS: 3.1250 mg | ORAL_TABLET | Freq: Two times a day (BID) | ORAL | Status: DC

## 2022-12-29 MED ORDER — INSULIN ASPART PROT & ASPART (70-30 MIX) 100 UNIT/ML PEN: 25.0000 [IU] | PEN_INJECTOR | Freq: Two times a day (BID) | SUBCUTANEOUS | 0 refills | Status: DC

## 2022-12-29 MED ORDER — POTASSIUM CHLORIDE CRYS ER 20 MEQ PO TBCR: 20.0000 meq | EXTENDED_RELEASE_TABLET | Freq: Every day | ORAL | Status: DC

## 2022-12-29 MED ORDER — DOXYCYCLINE MONOHYDRATE 100 MG PO TABS: 100.0000 mg | ORAL_TABLET | Freq: Two times a day (BID) | ORAL | 0 refills | Status: AC

## 2022-12-29 MED ORDER — CARVEDILOL 3.125 MG PO TABS: 3.1250 mg | ORAL_TABLET | Freq: Two times a day (BID) | ORAL | 0 refills | Status: DC

## 2022-12-29 MED ORDER — FUROSEMIDE 40 MG PO TABS: 40.0000 mg | ORAL_TABLET | Freq: Every day | ORAL | Status: DC

## 2022-12-29 NOTE — Progress Notes (Signed)
Orthopedic Tech Progress Note Patient Details:  Dakota Ramirez 07-29-1971 [id number]  Ortho Devices Type of Ortho Device: Prafo boot/shoe Ortho Device/Splint Location: RLE Ortho Device/Splint Interventions: Ordered   Post Interventions Patient Tolerated: Well Instructions Provided: Adjustment of device  [name] [name] 12/29/2022, 10:37 AM

## 2022-12-29 NOTE — Discharge Summary (Addendum)
Name: Dakota Ramirez MRN: [id number] DOB: [birth year] 52 y.o. PCP: Patient, No Pcp Per  Date of Admission: 12/26/2022 11:46 AM Date of Discharge: 12/29/22 Attending Physician: Dickie La, MD  Discharge Diagnosis: 1. 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   amoxicillin-clavulanate 875-125 MG tablet Commonly known as: AUGMENTIN Take 1 tablet by mouth 2 (two) times daily for 21 days.   carvedilol 3.125 MG tablet Commonly known as: COREG Take 1 tablet (3.125 mg total) by mouth 2 (two) times daily with a meal. What changed:  medication strength how much to take when to take this   digoxin 0.125 MG tablet Commonly known as: LANOXIN Take 1 tablet (0.125 mg total) by mouth daily.   doxycycline 100 MG tablet Commonly known as: ADOXA Take 1 tablet (100 mg total) by mouth 2 (two) times daily for 21 days.   furosemide 40 MG tablet Commonly known as: LASIX Take 1 tablet (40 mg total) by mouth daily.   insulin aspart protamine - aspart (70-30) 100 UNIT/ML FlexPen Commonly known as: NOVOLOG 70/30 MIX Inject 25 Units into the skin 2 (two) times daily with a meal.   potassium chloride SA 20 MEQ tablet Commonly known as: KLOR-CON M Take 1 tablet (20 mEq total) by mouth 2 (two) times daily.    sacubitril-valsartan 97-103 MG Commonly known as: ENTRESTO Take 1 tablet by mouth 2 (two) times daily. Replaces: Entresto 49-51 MG   spironolactone 25 MG tablet Commonly known as: ALDACTONE Take 1 tablet (25 mg total) by mouth daily.               Discharge Care Instructions  (From admission, onward)           Start     Ordered   12/29/22 0000  Leave dressing on - Keep it clean, dry, and intact until clinic visit        12/29/22 1034           Disposition and follow-up:   [name] was discharged from [facility] in Stable condition.  At the hospital follow up visit please address:  1.  Heart failure: Dry weight on day of discharge 114kg. Patient discharged on new dose of Entresto and carvedilol. Please sure patient is taking these and is following up with HF clinic on 5/30.   2.  Chronic osteomyelitis: On L toe. Consulted ortho inpatient, non-surgical management. Per ID, three weeks of oral antibiotics (last day 01/18/23) and follow-up with ortho (or podiatry with [facility]). Please make sure he is taking antibiotics and has follow-up appointments planned.  3.   Chronic venous insufficiency ulcers: Has Unna boot due to mild wounds on RLE due to venous insufficiency. Patient had this placed 5/18 and should have this changed weekly by  [facility] or orthopedics.   4. Type II diabetes: On Insulin 70/30 25u twice daily. Would try basal insulin +/- mealtime, would benefit from GLP-1.   5.  Labs / imaging needed at time of follow-up: BMP  6.  Pending labs/ test needing follow-up: n/a  Follow-up Appointments:  Follow-up Information     [facility] Follow up on 01/10/2023.   Specialty: Cardiology Why: Follow up in the Advanced Heart Failure Clinic 01/10/23 at 930am Entrance C, free valet Contact information: [address] [id number] mc [address] [place] [address] [phone]        Nadara Mustard, MD Follow up in 1 week(s).   Specialty: Orthopedic Surgery Contact information: [address] [place] [address] [phone]         [facility]. Schedule an appointment as soon as possible for a visit in 1 week(s).   Why: This appointment will be to remove your Unna wrap on your right leg. Please make an appointment one week after discharge. Contact information: [address] [place] [address] [phone]               Hospital Course by problem list 1. Acute on chronic biventricular heart failure:  Patient came to the Emergency Department after being sent from his cardiologist's office for gross volume overload with significant JVD and weight gain. He arrived afebrile, tachycardic to 120's, normotensive, and not hypoxic. On admission he did appear to be perfusing well and there was not a concern for low-output decompensated state. Advanced heart failure was consulted on admission given history of EF <20%. IV lasix was initiated at 80mg  twice daily. Over the following few days patient had excellent diuresis, totaling >13L net negative fluids. From admission had roughly 19kg weight loss along with this. He did remain mildly tachycardic throughout admission and his beta blocker was held until IV diuresis was complete. On day of discharge patient felt symptomatically improved. Plan at discharge to increase his Entresto, decrease his carvedilol from admission and have him closely follow-up with the heart failure clinic.   2. Chronic osteomyelitis of L great toe: During admission patient was found to have ulcerations on his heel and toe (both were chronic and had been followed by podiatry). He was not showing signs of systemic infection therefore antibiotics were initially held. Foot XR did show possible osteomyelitis of great L toe. MRI confirmed osteomyelitis without evidence of osteo in calcaneous. ABI's were obtained which did not  show peripheral vascular disease. Orthopedics was then consulted, which suggested non-operative management. At that time, primary team discussed with infectious disease, who recommended three weeks of oral antibiotics with close follow-up with orthopedics (or podiatry at [facility], where he was seen previously). Patient was started on Augmentin and doxycycline on day of discharge with end date 01/18/23. If no improvement patient might need surgical intervention at that time. Patient was given information to schedule an appointment with orthopedics.  3.  Chronic venous insufficiency ulcers: Noted on RLE without signs of infection. Plan to place Unna boot and have patient follow-up with orthopedics or the [facility] Center in one week (whichever he can get into) for these to be monitored.   Discharge Exam:   BP 119/72 (BP Location: Right Arm)   Pulse (!) 110   Temp 98.8 F (37.1 C) (Oral)   Resp 20   Ht 5\' 11"  (1.803 m)   Wt 114.2 kg   SpO2 97%  BMI 35.11 kg/m  Discharge exam:  General: Resting comfortably in no acute distress CV: Regular rate, rhythm. 3/6 systolic murmur best heard at LLSB. Warm extremities. Pulm: Normal work of breathing on room air. Clear to auscultation bilaterally. GI: Abdomen soft, non-tender, non-distended. Normoactive bowel sounds. Neuro: Awake, alert, conversing appropriately. Grossly non-focal. Psych: Normal mood, affect, speech .  Pertinent Labs, Studies, and Procedures:     Latest Ref Rng & Units 12/26/2022    2:19 PM 12/26/2022   11:32 AM 12/13/2021    3:13 PM  CBC  WBC 4.0 - 10.5 K/uL 5.8  5.8  7.2   Hemoglobin 13.0 - 17.0 g/dL 16.1  09.6  04.5   Hematocrit 39.0 - 52.0 % 41.7  44.2  43.3   Platelets 150 - 400 K/uL 247  238  231       Latest Ref Rng & Units 12/29/2022   12:07 AM 12/28/2022   12:04 AM 12/27/2022    5:43 AM  BMP  Glucose 70 - 99 mg/dL 409  811  914   BUN 6 - 20 mg/dL 19  15  12    Creatinine 0.61 - 1.24 mg/dL 7.82  9.56  2.13   Sodium  135 - 145 mmol/L 132  129  132   Potassium 3.5 - 5.1 mmol/L 3.5  3.7  4.0   Chloride 98 - 111 mmol/L 96  92  94   CO2 22 - 32 mmol/L 27  28  31    Calcium 8.9 - 10.3 mg/dL 8.0  7.9  7.7    ECHOCARDIOGRAM 12/27/22 1. Left ventricular ejection fraction, by estimation, is <20%. The left ventricle has severely decreased function. The left ventricle demonstrates global hypokinesis. The left ventricular internal cavity size was severely  dilated. Left ventricular diastolic function could not be evaluated. Elevated left ventricular end-diastolic pressure.   2. Right ventricular systolic function is mildly reduced. The right ventricular size is severely enlarged.   3. Left atrial size was severely dilated.   4. Right atrial size was severely dilated.   5. The mitral valve is normal in structure. Mild to moderate mitral valve regurgitation. No evidence of mitral stenosis.   6. The aortic valve is tricuspid. Aortic valve regurgitation is not visualized. Aortic valve sclerosis/calcification is present, without any evidence of aortic stenosis. Aortic valve area, by VTI measures 2.64 cm. Aortic valve mean gradient measures 2.0 mmHg. Aortic valve Vmax measures 0.92 m/s.   7. The inferior vena cava is normal in size with greater than 50% respiratory variability, suggesting right atrial pressure of 3 mmHg.   MRI L FOOT/ANKLE  12/28/22 Osteomyelitis of the great toe distal phalanx. Plantar medial heel soft tissue ulcer with adjacent cellulitis and inflammation/infectious involvement of the adjacent central band of the proximal plantar fascia. No evidence of soft tissue abscess. Very mild edema and enhancement within the posterior plantar calcaneus at the plantar fascia insertion, favored to reflect reactive marrow change, though early osteomyelitis could have a similar appearance. Mild marrow signal abnormality and the lateral plantar calcaneus, favored to represent reactive marrow change. Mild tenosynovitis of the  posterior tibial tendon at the level of the talar head/neck. No evidence of acute tendon tear. 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  Discharge Instructions: Discharge Instructions     (HEART FAILURE PATIENTS) Call MD:  Anytime you have any of the following symptoms: 1) 3 pound weight gain in 24 hours or 5  pounds in 1 week 2) shortness of breath, with or without a dry hacking cough 3) swelling in the hands, feet or stomach 4) if you have to sleep on extra pillows at night in order to breathe.   Complete by: As directed    Call MD for:  extreme fatigue   Complete by: As directed    Call MD for:  persistant dizziness or light-headedness   Complete by: As directed    Diet - low sodium heart healthy   Complete by: As directed    Discharge instructions   Complete by: As directed    Mr. Havok, Gellerman were admitted because of your heart failure. While here, we found a bone infection in your toe. It will be very important to keep up with your medications and follow-up regularly with your physicians. Please see the following notes:  -MEDICATIONS: -You will take a new dose of Entresto (sacubitril-valsartan). You will now take 97-103mg  twice daily. This will be given to you before you leave the hospital.  -You have a new dosage of Coreg (carvedilol). You will now take 3.125mg  twice daily. You do not need to take your old dosage. I have sent in a new prescription for this to [facility]. -Continue with your spironolactone 25mg  daily, digoxin 0.125mg  daily, furosemide 40mg  daily, potassium daily.   -I have prescribed two antibiotics for your foot infection. You will take these for three weeks. These are listed below: -Augmentin (amoxicillin-clavulanate) 875-125mg  twice daily -Doxycycline 100mg  twice daily  - Continue taking your insulin 25 units twice daily. Your diabetes is uncontrolled. Please make sure to follow-up with your primary care  physician.   -FOLLOW-UPS: -You will need to make an appointment come to the [facility] Center in one week to have the wrapping on your right leg changed. The number to schedule the appointment is [phone]. - You will need to follow-up with the cardiology office on 5/30 at 9:30AM.  - You need to follow-up with Dr. Lajoyce Corners, the orthopedic surgeon in the next week.   It was a pleasure meeting you, Mr. Marciante.   Thank you, Evlyn Kanner, MD   Increase activity slowly   Complete by: As directed    Leave dressing on - Keep it clean, dry, and intact until clinic visit   Complete by: As directed       Signed: Evlyn Kanner, MD 12/29/2022, 10:35 AM   Pager: [phone]

## 2022-12-29 NOTE — Progress Notes (Signed)
Cardiology Progress Note  Patient ID: Dakota Ramirez MRN: [id number] DOB: 26-Mar-1971 Date of Encounter: 12/29/2022  Primary Cardiologist: None  Subjective   Chief Complaint: None.   HPI: Good diuresis.  Euvolemic on exam.  Wanting to go home.  ROS:  All other ROS reviewed and negative. Pertinent positives noted in the HPI.     Inpatient Medications  Scheduled Meds:  Chlorhexidine Gluconate Cloth  6 each Topical Q0600   digoxin  0.125 mg Oral Daily   enoxaparin (LOVENOX) injection  60 mg Subcutaneous Q24H   furosemide  40 mg Oral Daily   insulin aspart  0-20 Units Subcutaneous TID WC   insulin glargine-yfgn  22 Units Subcutaneous QHS   leptospermum manuka honey  1 Application Topical Daily   melatonin  3 mg Oral QHS   mupirocin ointment  1 Application Nasal BID   potassium chloride  20 mEq Oral Daily   sacubitril-valsartan  1 tablet Oral BID   sodium chloride flush  3 mL Intravenous Q12H   spironolactone  25 mg Oral Daily   Continuous Infusions:  sodium chloride     PRN Meds: sodium chloride, acetaminophen, camphor-menthol, sodium chloride flush   Vital Signs   Vitals:   12/28/22 1952 12/28/22 2325 12/29/22 0404 12/29/22 0411  BP: 111/83 111/78 119/81   Pulse: (!) 114 (!) 116 (!) 116   Resp: 20 18 20    Temp: 97.8 F (36.6 C) 98.3 F (36.8 C) 98.8 F (37.1 C)   TempSrc: Oral Oral Oral   SpO2: 98% 96% 97%   Weight:    114.2 kg  Height:        Intake/Output Summary (Last 24 hours) at 12/29/2022 0839 Last data filed at 12/29/2022 0340 Gross per 24 hour  Intake 1196.74 ml  Output 6900 ml  Net -5703.26 ml      12/29/2022    4:11 AM 12/28/2022    4:45 AM 12/27/2022    6:00 AM  Last 3 Weights  Weight (lbs) 251 lb 12.3 oz 267 lb 3.2 oz 278 lb 6.4 oz  Weight (kg) 114.2 kg 121.2 kg 126.281 kg      Physical Exam   Vitals:   12/28/22 1952 12/28/22 2325 12/29/22 0404 12/29/22 0411  BP: 111/83 111/78 119/81   Pulse: (!) 114 (!) 116 (!) 116   Resp: 20 18 20     Temp: 97.8 F (36.6 C) 98.3 F (36.8 C) 98.8 F (37.1 C)   TempSrc: Oral Oral Oral   SpO2: 98% 96% 97%   Weight:    114.2 kg  Height:        Intake/Output Summary (Last 24 hours) at 12/29/2022 0839 Last data filed at 12/29/2022 0340 Gross per 24 hour  Intake 1196.74 ml  Output 6900 ml  Net -5703.26 ml       12/29/2022    4:11 AM 12/28/2022    4:45 AM 12/27/2022    6:00 AM  Last 3 Weights  Weight (lbs) 251 lb 12.3 oz 267 lb 3.2 oz 278 lb 6.4 oz  Weight (kg) 114.2 kg 121.2 kg 126.281 kg    Body mass index is 35.11 kg/m.  General: Well nourished, well developed, in no acute distress Head: Atraumatic, normal size  Eyes: PEERLA, EOMI  Neck: Supple, no JVD Endocrine: No thryomegaly Cardiac: Tachycardia noted, no murmurs Lungs: Clear to auscultation bilaterally, no wheezing, rhonchi or rales  Abd: Soft, nontender, no hepatomegaly  Ext: Trace edema Musculoskeletal: No deformities, BUE and BLE strength normal and  equal Skin: Warm and dry, no rashes   Neuro: Alert and oriented to person, place, time, and situation, CNII-XII grossly intact, no focal deficits  Psych: Normal mood and affect   Labs  High Sensitivity Troponin:  No results for input(s): "TROPONINIHS" in the last 720 hours.   Cardiac EnzymesNo results for input(s): "TROPONINI" in the last 168 hours. No results for input(s): "TROPIPOC" in the last 168 hours.  Chemistry Recent Labs  Lab 12/26/22 1132 12/26/22 1419 12/27/22 0543 12/28/22 0004 12/29/22 0007  NA 134*   < > 132* 129* 132*  K 4.1   < > 4.0 3.7 3.5  CL 94*   < > 94* 92* 96*  CO2 29   < > 31 28 27   GLUCOSE 257*   < > 188* 176* 204*  BUN 10   < > 12 15 19   CREATININE 0.92   < > 1.01 0.98 1.08  CALCIUM 8.1*   < > 7.7* 7.9* 8.0*  PROT 8.8*  --   --   --   --   ALBUMIN 1.8*  --   --   --   --   AST 31  --   --   --   --   ALT 20  --   --   --   --   ALKPHOS 208*  --   --   --   --   BILITOT 1.0  --   --   --   --   GFRNONAA >60   < > >60 >60 >60   ANIONGAP 11   < > 7 9 9    < > = values in this interval not displayed.    Hematology Recent Labs  Lab 12/26/22 1132 12/26/22 1419  WBC 5.8 5.8  RBC 4.99 4.64  HGB 13.4 12.9*  HCT 44.2 41.7  MCV 88.6 89.9  MCH 26.9 27.8  MCHC 30.3 30.9  RDW 15.9* 15.9*  PLT 238 247   BNP Recent Labs  Lab 12/26/22 1132 12/26/22 1419  BNP 2,409.9* 1,929.8*    DDimer No results for input(s): "DDIMER" in the last 168 hours.   Radiology  MR FOOT LEFT W WO CONTRAST  Result Date: 12/28/2022 CLINICAL DATA:  Osteomyelitis suspected, foot, xray done osteomyelitis; Osteomyelitis suspected, ankle, xray done osteomyelitis EXAM: MRI OF THE LEFT FOREFOOT WITHOUT AND WITH CONTRAST; MRI OF THE LEFT ANKLE WITHOUT AND WITH CONTRAST TECHNIQUE: Multiplanar, multisequence MR imaging of the left foot and ankle was performed both before and after administration of intravenous contrast. CONTRAST:  10mL GADAVIST GADOBUTROL 1 MMOL/ML IV SOLN COMPARISON:  Radiograph 12/27/2022 FINDINGS: MRI left foot: Bones/Joint/Cartilage There is marrow edema and confluent low T1 signal in the great toe distal phalanx with associated enhancement. There is mild osteoarthritis in the midfoot and of the first MTP joint. Ligaments Intact Lisfranc ligament.  Intact MTP collateral ligaments. Muscles and Tendons No acute tendon tear. Diffuse muscle edema and atrophy in the foot as is commonly seen in diabetics compatible with chronic denervation. Soft tissues There is soft tissue swelling of the foot most prominent dorsally. No focal fluid collection. MRI left ankle: TENDONS Peroneal: Accessory peroneal quartus. Peroneal longus and brevis tendons are intact. Posteromedial: Mild tenosynovitis of the posterior tibial tendon at the level of the talar head/neck. No PT tendon tear. Intact flexor hallucis longus and flexor digitorum longus tendons. Anterior: Intact tibialis anterior, extensor hallucis longus and extensor digitorum longus tendons. Achilles:  Intact. Plantar Fascia: There is thickening and  increased signal within the central band plantar fascia proximally, with adjacent plantar soft tissue ulcer. Minimal edema within the adjacent plantar posterior calcaneus with plantar calcaneal spur. LIGAMENTS Lateral: Anterior talofibular ligament intact. Calcaneofibular ligament intact. Posterior talofibular ligament intact. Anterior and posterior tibiofibular ligaments intact. Medial: Deltoid ligament intact. Spring ligament intact. CARTILAGE Ankle Joint: No significant joint effusion. Mild tibiotalar osteoarthritis. No osteochondral defect. Subtalar Joints/Sinus Tarsi: Mild posterior and middle subtalar osteoarthritis. Mild edema and loss of fat signal in the sinus tarsi. Bones: There is mild edema and enhancement within the lateral plantar calcaneus. Minimal edema within the posterior plantar calcaneus near the plantar fascia insertion. Preserved T1 marrow signal. Soft Tissue: There is a soft tissue ulcer of the plantar medial heel, which extends in close proximity to the proximal central band of the plantar fascia. There is adjacent edema signal and soft tissue enhancement without well organized/drainable fluid collection. Generalized mild ankle soft tissue swelling. IMPRESSION: Osteomyelitis of the great toe distal phalanx. Plantar medial heel soft tissue ulcer with adjacent cellulitis and inflammation/infectious involvement of the adjacent central band of the proximal plantar fascia. No evidence of soft tissue abscess. Very mild edema and enhancement within the posterior plantar calcaneus at the plantar fascia insertion, favored to reflect reactive marrow change, though early osteomyelitis could have a similar appearance. Mild marrow signal abnormality and the lateral plantar calcaneus, favored to represent reactive marrow change. Mild tenosynovitis of the posterior tibial tendon at the level of the talar head/neck. No evidence of acute tendon tear. 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 Electronically Signed   By: Caprice Renshaw M.D.   On: 12/28/2022 15:56   MR ANKLE LEFT W WO CONTRAST  Result Date: 12/28/2022 CLINICAL DATA:  Osteomyelitis suspected, foot, xray done osteomyelitis; Osteomyelitis suspected, ankle, xray done osteomyelitis EXAM: MRI OF THE LEFT FOREFOOT WITHOUT AND WITH CONTRAST; MRI OF THE LEFT ANKLE WITHOUT AND WITH CONTRAST TECHNIQUE: Multiplanar, multisequence MR imaging of the left foot and ankle was performed both before and after administration of intravenous contrast. CONTRAST:  10mL GADAVIST GADOBUTROL 1 MMOL/ML IV SOLN COMPARISON:  Radiograph 12/27/2022 FINDINGS: MRI left foot: Bones/Joint/Cartilage There is marrow edema and confluent low T1 signal in the great toe distal phalanx with associated enhancement. There is mild osteoarthritis in the midfoot and of the first MTP joint. Ligaments Intact Lisfranc ligament.  Intact MTP collateral ligaments. Muscles and Tendons No acute tendon tear. Diffuse muscle edema and atrophy in the foot as is commonly seen in diabetics compatible with chronic denervation. Soft tissues There is soft tissue swelling of the foot most prominent dorsally. No focal fluid collection. MRI left ankle: TENDONS Peroneal: Accessory peroneal quartus. Peroneal longus and brevis tendons are intact. Posteromedial: Mild tenosynovitis of the posterior tibial tendon at the level of the talar head/neck. No PT tendon tear. Intact flexor hallucis longus and flexor digitorum longus tendons. Anterior: Intact tibialis anterior, extensor hallucis longus and extensor digitorum longus tendons. Achilles: Intact. Plantar Fascia: There is thickening and increased signal within the central band plantar fascia proximally, with adjacent plantar soft tissue ulcer. Minimal edema within the adjacent plantar posterior calcaneus with plantar calcaneal spur. LIGAMENTS Lateral: Anterior talofibular ligament  intact. Calcaneofibular ligament intact. Posterior talofibular ligament intact. Anterior and posterior tibiofibular ligaments intact. Medial: Deltoid ligament intact. Spring ligament intact. CARTILAGE Ankle Joint: No significant joint effusion. Mild tibiotalar osteoarthritis. No osteochondral defect. Subtalar Joints/Sinus Tarsi: Mild posterior and middle subtalar osteoarthritis.  Mild edema and loss of fat signal in the sinus tarsi. Bones: There is mild edema and enhancement within the lateral plantar calcaneus. Minimal edema within the posterior plantar calcaneus near the plantar fascia insertion. Preserved T1 marrow signal. Soft Tissue: There is a soft tissue ulcer of the plantar medial heel, which extends in close proximity to the proximal central band of the plantar fascia. There is adjacent edema signal and soft tissue enhancement without well organized/drainable fluid collection. Generalized mild ankle soft tissue swelling. IMPRESSION: Osteomyelitis of the great toe distal phalanx. Plantar medial heel soft tissue ulcer with adjacent cellulitis and inflammation/infectious involvement of the adjacent central band of the proximal plantar fascia. No evidence of soft tissue abscess. Very mild edema and enhancement within the posterior plantar calcaneus at the plantar fascia insertion, favored to reflect reactive marrow change, though early osteomyelitis could have a similar appearance. Mild marrow signal abnormality and the lateral plantar calcaneus, favored to represent reactive marrow change. Mild tenosynovitis of the posterior tibial tendon at the level of the talar head/neck. No evidence of acute tendon tear. 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 Electronically Signed   By: Caprice Renshaw M.D.   On: 12/28/2022 15:56   VAS [place] ABI WITH/WO TBI  Result Date: 12/28/2022  LOWER EXTREMITY DOPPLER STUDY Patient Name:  Dakota Ramirez  Date of Exam:   12/28/2022 Medical  Rec #: [id number]     Accession #:    [id number] Date of Birth: 06-05-1971     Patient Gender: M Patient Age:   40 years Exam Location:  [facility] Procedure:      VAS [place] ABI WITH/WO TBI Referring Phys: ERIK HOFFMAN --------------------------------------------------------------------------------  Indications: Ulceration. right transmetatarsal amputation High Risk Factors: Hypertension, Diabetes.  Limitations: Today's exam was limited due to an open wound and bandages. Comparison Study: No prior studies. Performing Technologist: Olen Cordial RVT  Examination Guidelines: A complete evaluation includes at minimum, Doppler waveform signals and systolic blood pressure reading at the level of bilateral brachial, anterior tibial, and posterior tibial arteries, when vessel segments are accessible. Bilateral testing is considered an integral part of a complete examination. Photoelectric Plethysmograph (PPG) waveforms and toe systolic pressure readings are included as required and additional duplex testing as needed. Limited examinations for reoccurring indications may be performed as noted.  ABI Findings: +---------+------------------+-----+-----------+----------+ Right    Rt Pressure (mmHg)IndexWaveform   Comment    +---------+------------------+-----+-----------+----------+ Brachial 115                    triphasic             +---------+------------------+-----+-----------+----------+ PTA      130               1.10 multiphasic           +---------+------------------+-----+-----------+----------+ DP       121               1.03 monophasic            +---------+------------------+-----+-----------+----------+ Great Toe                                  Amputation +---------+------------------+-----+-----------+----------+ +---------+------------------+-----+---------+---------------+ Left     Lt Pressure (mmHg)IndexWaveform Comment          +---------+------------------+-----+---------+---------------+ Brachial 118  triphasic                +---------+------------------+-----+---------+---------------+ PTA      122               1.03 triphasic                +---------+------------------+-----+---------+---------------+ DP       124               1.05 triphasic                +---------+------------------+-----+---------+---------------+ Great Toe                                Great toe ulcer +---------+------------------+-----+---------+---------------+ +-------+-----------+-----------+------------+------------+ ABI/TBIToday's ABIToday's TBIPrevious ABIPrevious TBI +-------+-----------+-----------+------------+------------+ Right  1.1                                            +-------+-----------+-----------+------------+------------+ Left   1.05                                           +-------+-----------+-----------+------------+------------+  Summary: Right: Resting right ankle-brachial index is within normal range. Unable to obtain TBI due to great toe amputation. Left: Resting left ankle-brachial index is within normal range. Unable to obtain TBI due to great toe ulcers. *See table(s) above for measurements and observations.  Electronically signed by Gerarda Fraction on 12/28/2022 at 2:57:12 PM.    Final    ECHOCARDIOGRAM COMPLETE  Result Date: 12/27/2022    ECHOCARDIOGRAM REPORT   Patient Name:   Dakota Ramirez Date of Exam: 12/27/2022 Medical Rec #:  [id number]    Height:       71.0 in Accession #:    [id number]   Weight:       278.4 lb Date of Birth:  12-Apr-1971    BSA:          2.427 m Patient Age:    51 years     BP:           107/89 mmHg Patient Gender: M            HR:           111 bpm. Exam Location:  Inpatient Procedure: 2D Echo, Cardiac Doppler, Color Doppler and Intracardiac            Opacification Agent Indications:    CHF - Acute Systolic  History:        Patient has prior  history of Echocardiogram examinations, most                 recent 11/27/2021. CHF, PAD, Arrythmias:Tachycardia,                 Signs/Symptoms:Shortness of Breath and Dyspnea; Risk                 Factors:Diabetes and Hypertension.  Sonographer:    Wallie Char Referring Phys: [id number] Roxy Horseman SIMMONS  Sonographer Comments: Image acquisition challenging due to patient body habitus. IMPRESSIONS  1. Left ventricular ejection fraction, by estimation, is <20%. The left ventricle has severely decreased function. The left ventricle demonstrates global hypokinesis. The left ventricular internal cavity size was severely dilated. Left ventricular diastolic  function could not be evaluated. Elevated left ventricular end-diastolic pressure.  2. Right ventricular systolic function is mildly reduced. The right ventricular size is severely enlarged.  3. Left atrial size was severely dilated.  4. Right atrial size was severely dilated.  5. The mitral valve is normal in structure. Mild to moderate mitral valve regurgitation. No evidence of mitral stenosis.  6. The aortic valve is tricuspid. Aortic valve regurgitation is not visualized. Aortic valve sclerosis/calcification is present, without any evidence of aortic stenosis. Aortic valve area, by VTI measures 2.64 cm. Aortic valve mean gradient measures 2.0 mmHg. Aortic valve Vmax measures 0.92 m/s.  7. The inferior vena cava is normal in size with greater than 50% respiratory variability, suggesting right atrial pressure of 3 mmHg. FINDINGS  Left Ventricle: Left ventricular ejection fraction, by estimation, is <20%. The left ventricle has severely decreased function. The left ventricle demonstrates global hypokinesis. Definity contrast agent was given IV to delineate the left ventricular endocardial borders. The left ventricular internal cavity size was severely dilated. There is no left ventricular hypertrophy. Left ventricular diastolic function could not be evaluated.  Elevated left ventricular end-diastolic pressure. Right Ventricle: The right ventricular size is severely enlarged. No increase in right ventricular wall thickness. Right ventricular systolic function is mildly reduced. Left Atrium: Left atrial size was severely dilated. Right Atrium: Right atrial size was severely dilated. Pericardium: Trivial pericardial effusion is present. The pericardial effusion is circumferential. Mitral Valve: The mitral valve is normal in structure. Mild mitral annular calcification. Mild to moderate mitral valve regurgitation, with eccentric posteriorly directed jet. No evidence of mitral valve stenosis. MV peak gradient, 6.5 mmHg. The mean mitral valve gradient is 3.0 mmHg. Tricuspid Valve: The tricuspid valve is normal in structure. Tricuspid valve regurgitation is mild . No evidence of tricuspid stenosis. Aortic Valve: The aortic valve is tricuspid. Aortic valve regurgitation is not visualized. Aortic valve sclerosis/calcification is present, without any evidence of aortic stenosis. Aortic valve mean gradient measures 2.0 mmHg. Aortic valve peak gradient measures 3.4 mmHg. Aortic valve area, by VTI measures 2.64 cm. Pulmonic Valve: The pulmonic valve was normal in structure. Pulmonic valve regurgitation is trivial. No evidence of pulmonic stenosis. Aorta: The aortic root is normal in size and structure. Venous: The inferior vena cava is normal in size with greater than 50% respiratory variability, suggesting right atrial pressure of 3 mmHg. IAS/Shunts: No atrial level shunt detected by color flow Doppler.  LEFT VENTRICLE PLAX 2D LVIDd:         6.60 cm      Diastology LVIDs:         6.00 cm      LV e' medial:    6.68 cm/s LV PW:         1.20 cm      LV E/e' medial:  18.3 LV IVS:        0.60 cm      LV e' lateral:   9.47 cm/s LVOT diam:     1.70 cm      LV E/e' lateral: 12.9 LV SV:         33 LV SV Index:   14 LVOT Area:     2.27 cm  LV Volumes (MOD) LV vol d, MOD A2C: 296.0 ml LV vol  d, MOD A4C: 273.0 ml LV vol s, MOD A2C: 238.0 ml LV vol s, MOD A4C: 221.0 ml LV SV MOD A2C:     58.0 ml LV SV MOD A4C:  273.0 ml LV SV MOD BP:      54.7 ml RIGHT VENTRICLE             IVC RV Basal diam:  5.90 cm     IVC diam: 2.50 cm RV S prime:     14.20 cm/s TAPSE (M-mode): 2.1 cm LEFT ATRIUM              Index        RIGHT ATRIUM           Index LA diam:        4.40 cm  1.81 cm/m   RA Area:     28.70 cm LA Vol (A2C):   107.0 ml 44.09 ml/m  RA Volume:   107.00 ml 44.09 ml/m LA Vol (A4C):   110.0 ml 45.33 ml/m LA Biplane Vol: 113.0 ml 46.56 ml/m  AORTIC VALVE AV Area (Vmax):    2.36 cm AV Area (Vmean):   2.30 cm AV Area (VTI):     2.64 cm AV Vmax:           92.00 cm/s AV Vmean:          67.700 cm/s AV VTI:            0.125 m AV Peak Grad:      3.4 mmHg AV Mean Grad:      2.0 mmHg LVOT Vmax:         95.80 cm/s LVOT Vmean:        68.567 cm/s LVOT VTI:          0.146 m LVOT/AV VTI ratio: 1.16  AORTA Ao Root diam: 2.80 cm Ao Asc diam:  3.60 cm MITRAL VALVE                  TRICUSPID VALVE MV Area (PHT): 6.90 cm       TR Peak grad:   37.0 mmHg MV Area VTI:   1.39 cm       TR Vmax:        304.00 cm/s MV Peak grad:  6.5 mmHg MV Mean grad:  3.0 mmHg       SHUNTS MV Vmax:       1.27 m/s       Systemic VTI:  0.15 m MV Vmean:      80.2 cm/s      Systemic Diam: 1.70 cm MV Decel Time: 110 msec MR Peak grad:    89.5 mmHg MR Mean grad:    58.0 mmHg MR Vmax:         473.00 cm/s MR Vmean:        359.0 cm/s MR PISA:         2.26 cm MR PISA Eff ROA: 15 mm MR PISA Radius:  0.60 cm MV E velocity: 122.00 cm/s Armanda Magic MD Electronically signed by Armanda Magic MD Signature Date/Time: 12/27/2022/3:21:18 PM    Final    US Abdomen Limited RUQ (LIVER/GB)  Result Date: 12/27/2022 CLINICAL DATA:  [id number] Alkaline phosphatase elevation [id number] EXAM: ULTRASOUND ABDOMEN LIMITED RIGHT UPPER QUADRANT COMPARISON:  CT 05/05/2018. FINDINGS: Gallbladder: Surgically absent. Common bile duct: Diameter: 4.1 mm, normal.  No intrahepatic  ductal dilation. Liver: Mildly heterogeneous and coarsened echotexture with preserved portal triads and mild surface nodularity. Portal vein is patent on color Doppler imaging with normal direction of blood flow towards the liver. Other: Small right pleural effusion. IMPRESSION: No evidence of biliary obstruction.  Prior cholecystectomy. Mildly heterogeneous liver parenchyma with  coarse echotexture and mild surface nodularity, possibly due to hepatic steatosis and/or fibrosis, similar to prior exam. Small right pleural effusion. Electronically Signed   By: Caprice Renshaw M.D.   On: 12/27/2022 14:57   DG Foot 2 Views Left  Result Date: 12/27/2022 CLINICAL DATA:  [id number] Foot ulcer (HCC) [id number] EXAM: LEFT FOOT - 2 VIEW COMPARISON:  August 29, 2008. FINDINGS: No acute fracture or dislocation. Scattered degenerative changes of the midfoot. No unexpected radiopaque foreign body. Soft tissue defect of the heel and great toe. There is erosive change of the distal first phalanx IMPRESSION: 1. Soft tissue defect of the great toe with underlying erosive change likely reflecting osteomyelitis. 2. Soft tissue defect of the heel without radiographic evidence of underlying osteomyelitis. Electronically Signed   By: Meda Klinefelter M.D.   On: 12/27/2022 14:13    Cardiac Studies  TTE 12/27/2022  1. Left ventricular ejection fraction, by estimation, is <20%. The left  ventricle has severely decreased function. The left ventricle demonstrates  global hypokinesis. The left ventricular internal cavity size was severely  dilated. Left ventricular  diastolic function could not be evaluated. Elevated left ventricular  end-diastolic pressure.   2. Right ventricular systolic function is mildly reduced. The right  ventricular size is severely enlarged.   3. Left atrial size was severely dilated.   4. Right atrial size was severely dilated.   5. The mitral valve is normal in structure. Mild to moderate mitral valve   regurgitation. No evidence of mitral stenosis.   6. The aortic valve is tricuspid. Aortic valve regurgitation is not  visualized. Aortic valve sclerosis/calcification is present, without any  evidence of aortic stenosis. Aortic valve area, by VTI measures 2.64 cm.  Aortic valve mean gradient measures  2.0 mmHg. Aortic valve Vmax measures 0.92 m/s.   7. The inferior vena cava is normal in size with greater than 50%  respiratory variability, suggesting right atrial pressure of 3 mmHg.   Patient Profile  Dakota Ramirez is a 52 y.o. 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  Assessment & Plan   # Acute on chronic systolic heart failure, EF less than 20% -Nonischemic.  Severe biventricular failure.  Euvolemic on examination today. -Stop IV diuresis.  7.1 L of urine output overnight.  Weight is down 14.6 kg.  Trace lower extremity edema.  We will start him on Lasix 40 mg daily. -Replace magnesium. -Add carvedilol 3.125 mg twice daily.  This was recommended by the advanced heart failure service yesterday.  Continue digoxin 0.125 mg daily.  Continue Entresto 97-103 mg twice daily continue Aldactone 25 mg daily. -He has a follow-up appointment on 01/10/2023.  This is in the heart failure follow-up clinic.  # LE Ulcers # Status post amputation # Lower extremity osteomyelitis -ABIs appear normal.  Will continue with treatment per hospital team.  [facility] will sign off.   Medication Recommendations: As above Other recommendations (labs, testing, etc): None Follow up as an outpatient: He has follow-up on 01/10/2023  For questions or updates, please contact [facility] Please consult [email or web address] for contact info under        Signed, Gerri Spore T.  Flora Lipps, MD, [facility]  [facility]  12/29/2022 8:39 AM

## 2022-12-29 NOTE — Progress Notes (Signed)
Patient provided discharge instructions and medication from TO. PIV removed prior to discharge. Patient confirmed he had all belongings prior to discharge.

## 2022-12-29 NOTE — Progress Notes (Signed)
Orthopedic Tech Progress Note Patient Details:  Dakota Ramirez 1971/03/02 [id number]  Ortho Devices Type of Ortho Device: Roland Rack boot Ortho Device/Splint Location: RLE Ortho Device/Splint Interventions: Application   Post Interventions Patient Tolerated: Well  Makaiya Geerdes E Franke Menter 12/29/2022, 8:08 AM

## 2022-12-29 NOTE — Plan of Care (Signed)
  Problem: Education: Goal: Ability to describe self-care measures that may prevent or decrease complications (Diabetes Survival Skills Education) will improve Outcome: Adequate for Discharge Goal: Individualized Educational Video(s) Outcome: Adequate for Discharge   Problem: Coping: Goal: Ability to adjust to condition or change in health will improve Outcome: Adequate for Discharge   Problem: Fluid Volume: Goal: Ability to maintain a balanced intake and output will improve Outcome: Adequate for Discharge   Problem: Health Behavior/Discharge Planning: Goal: Ability to identify and utilize available resources and services will improve Outcome: Adequate for Discharge Goal: Ability to manage health-related needs will improve Outcome: Adequate for Discharge   Problem: Metabolic: Goal: Ability to maintain appropriate glucose levels will improve Outcome: Adequate for Discharge   Problem: Nutritional: Goal: Maintenance of adequate nutrition will improve Outcome: Adequate for Discharge Goal: Progress toward achieving an optimal weight will improve Outcome: Adequate for Discharge   Problem: Skin Integrity: Goal: Risk for impaired skin integrity will decrease Outcome: Adequate for Discharge   Problem: Tissue Perfusion: Goal: Adequacy of tissue perfusion will improve Outcome: Adequate for Discharge   Problem: Education: Goal: Ability to demonstrate management of disease process will improve Outcome: Adequate for Discharge Goal: Ability to verbalize understanding of medication therapies will improve Outcome: Adequate for Discharge Goal: Individualized Educational Video(s) Outcome: Adequate for Discharge   Problem: Activity: Goal: Capacity to carry out activities will improve Outcome: Adequate for Discharge   Problem: Cardiac: Goal: Ability to achieve and maintain adequate cardiopulmonary perfusion will improve Outcome: Adequate for Discharge   Problem: Education: Goal:  Knowledge of General Education information will improve Description: Including pain rating scale, medication(s)/side effects and non-pharmacologic comfort measures Outcome: Adequate for Discharge   Problem: Health Behavior/Discharge Planning: Goal: Ability to manage health-related needs will improve Outcome: Adequate for Discharge   Problem: Clinical Measurements: Goal: Ability to maintain clinical measurements within normal limits will improve Outcome: Adequate for Discharge Goal: Will remain free from infection Outcome: Adequate for Discharge Goal: Diagnostic test results will improve Outcome: Adequate for Discharge Goal: Respiratory complications will improve Outcome: Adequate for Discharge Goal: Cardiovascular complication will be avoided Outcome: Adequate for Discharge   Problem: Activity: Goal: Risk for activity intolerance will decrease Outcome: Adequate for Discharge   Problem: Nutrition: Goal: Adequate nutrition will be maintained Outcome: Adequate for Discharge   Problem: Coping: Goal: Level of anxiety will decrease Outcome: Adequate for Discharge   Problem: Elimination: Goal: Will not experience complications related to bowel motility Outcome: Adequate for Discharge Goal: Will not experience complications related to urinary retention Outcome: Adequate for Discharge   Problem: Pain Managment: Goal: General experience of comfort will improve Outcome: Adequate for Discharge   Problem: Safety: Goal: Ability to remain free from injury will improve Outcome: Adequate for Discharge   Problem: Skin Integrity: Goal: Risk for impaired skin integrity will decrease Outcome: Adequate for Discharge   

## 2022-12-30 NOTE — TOC CM/SW Note (Signed)
12/31/2022 900 am Contacted [facility], appt scheduled for 5/30 at 1015 am. Pt has a HF appt on that day at 930 am, encouraged pt to arrive early for HF appt. HF CSW, Annice Pih will follow up with food stamps and SS disability.   12/30/2022 730 am Pt has an appt with [facility]. Will assist with scheduling appt on for a visit in 1 week to evaluate his dressing wraps on his legs. Will check with MD for referral for Wound Care Clinic.[name] RN3 CCM, Heart Failure TOC CM [phone] [name] RN3 CCM, Heart Failure TOC CM [phone]

## 2022-12-30 NOTE — TOC Initial Note (Signed)
Transition of Care [facility]) - Initial/Assessment Note    Patient Details  Name: Dakota Ramirez MRN: [id number] Date of Birth: 02-Dec-1970  [facility]) CM/SW Contact:    Elliot Cousin, RN Phone Number: [phone] 12/30/2022, 7:14 AM  Clinical Narrative:  HF TOC CM spoke to pt and states his PCP, [facility]. Will arrange appt on 5/20 as office is closed. Pt states he has been to wound care in the past. His friend is doing the dressing changes to his foot. Pt states he has a RW and cane at home. Lives in a camper behind his father's home. Pt states he is eligible for food stamps but he didn't know he had them. They took the benefits away because he was not using. Will have HF CSW follow up Monday, or at his next appt. Pt states he wants to be more compliant with his medications and eating. States he does not want to have anymore surgeries on his feet. Discussed diabetic diet and foods to eat and avoid. States if he had food stamps he would eat better. Pt states he has his own transportation. Pt stated last time he applied for disability he was denied. Discussed CM completing the [employer] paperwork to assist him with completing disability application. Pt states he will have his friend help him work on application. Meds are in [facility] pharmacy.               Expected Discharge Plan: Home/Self Care Barriers to Discharge: No Barriers Identified   Patient Goals and CMS Choice Patient states their goals for this hospitalization and ongoing recovery are:: wants to manage his diabetes CMS Medicare.gov Compare Post Acute Care list provided to:: Patient        Expected Discharge Plan and Services   Discharge Planning Services: CM Consult   Living arrangements for the past 2 months: Mobile Home Expected Discharge Date: 12/29/22                                    Prior Living Arrangements/Services Living arrangements for the past 2 months: Mobile  Home Lives with:: Self Patient language and need for interpreter reviewed:: Yes Do you feel safe going back to the place where you live?: Yes      Need for Family Participation in Patient Care: No (Comment) Care giver support system in place?: No (comment) Current home services: DME (Rolling Walker, cane, walking stick, scale) Criminal Activity/Legal Involvement Pertinent to Current Situation/Hospitalization: No - Comment as needed  Activities of Daily Living Home Assistive Devices/Equipment: None ADL Screening (condition at time of admission) Patient's cognitive ability adequate to safely complete daily activities?: Yes Is the patient deaf or have difficulty hearing?: No Does the patient have difficulty seeing, even when wearing glasses/contacts?: No Does the patient have difficulty concentrating, remembering, or making decisions?: No Patient able to express need for assistance with ADLs?: Yes Does the patient have difficulty dressing or bathing?: No Independently performs ADLs?: Yes (appropriate for developmental age) Does the patient have difficulty walking or climbing stairs?: Yes (Becomes short of breath) Weakness of Legs: None Weakness of Arms/Hands: None  Permission Sought/Granted Permission sought to share information with : Case Manager, PCP, Family Supports Permission granted to share information with : Yes, Verbal Permission Granted  Share Information with NAME: Gloyd Dardar  Permission granted to share info w AGENCY: PCP  Permission granted  to share info w Relationship: brother  Permission granted to share info w Contact Information: [phone]  Emotional Assessment Appearance:: Appears stated age Attitude/Demeanor/Rapport: Engaged Affect (typically observed): Appropriate, Accepting Orientation: : Oriented to Self, Oriented to Place, Oriented to  Time, Oriented to Situation   Psych Involvement: No (comment)  Admission diagnosis:  Acute on chronic heart failure  (HCC) [I50.9] Acute on chronic congestive heart failure, unspecified heart failure type [facility]) [I50.9] Patient Active Problem List   Diagnosis Date Noted   Diabetic osteomyelitis (HCC) 12/28/2022   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 to breakdown of skin (HCC) 12/28/2022   Severe protein-calorie malnutrition (HCC) 12/28/2022   Acute on chronic HFrEF (heart failure with reduced ejection fraction) (HCC) 12/28/2022   Elevated alkaline phosphatase level 12/27/2022   Acute on chronic heart failure (HCC) 12/26/2022   Hyponatremia 11/29/2021   PVD (peripheral vascular disease) (HCC) 11/29/2021   Class 2 obesity 11/29/2021   CHF (congestive heart failure) (HCC) 11/26/2021   Type 2 diabetes mellitus (HCC) 11/26/2021   Essential hypertension 11/26/2021   PCP:  Patient, No Pcp Per Pharmacy:   [facility] [address], [place] - [address] [place] [address] Phone: [phone] Fax: [phone]  [facility] [place], [place] - [address] [place] [address] Phone: [phone] Fax: [phone]  [place] [facility] [address] [place] [address] Phone: [phone] Fax: [phone]  Gerri Spore [facility] - [name] [address] [place] [address] Phone: [phone] Fax: [phone]     Social Determinants of Health (SDOH) Social History: SDOH Screenings   Food Insecurity: Food Insecurity Present (12/28/2022)  Housing: Patient Declined (12/28/2022)  Transportation Needs: No Transportation Needs (11/29/2021)  Utilities: Not At Risk (12/28/2022)  Financial Resource Strain: High Risk (11/29/2021)  Tobacco Use: Low Risk  (12/28/2022)   SDOH Interventions: Food Insecurity Interventions: Intervention Not Indicated Housing Interventions: Intervention Not Indicated Utilities Interventions: Intervention Not Indicated   Readmission  Risk Interventions     No data to display

## 2023-01-02 ENCOUNTER — Other Ambulatory Visit (HOSPITAL_COMMUNITY): Payer: Self-pay

## 2023-01-05 ENCOUNTER — Other Ambulatory Visit (HOSPITAL_COMMUNITY): Payer: Self-pay

## 2023-01-08 ENCOUNTER — Other Ambulatory Visit (HOSPITAL_COMMUNITY): Payer: Self-pay

## 2023-01-08 NOTE — Progress Notes (Incomplete)
ADVANCED HF CLINIC NOTE   Primary Care: [facility] Dept. HF Cardiologist: Dr. Gala Romney  HPI: Dakota Ramirez is a 52 y.o. 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    Admitted 4/23 with new-onset acute HF.  Given IV lasix. Echo EF 20-25%, regional WMA, moderately dilated LV, RV moderately reduced, RVSP 40 mmHg, mild to moderate MR, dilated IVC with estimated RAP 15 mmHg. Cards consulted and GDMT started. 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  Overall diuresed 50 lbs. No SGLT2i with uncontrolled DM. Discharged home, weight 259 lbs.   Post hospital follow up, NYHA II, volume OK. Entresto increased and paramedicine arranged to help with med compliance.  Clinic visit 9/23 was NYHA II and volume OK. Missed follow up echo. Bedside echo 9/23 showed EF 20-25%.  cMRI 4/24 showed severe biventricular failure, LVEF 17%. RVEF 22% LGE imaging very poor quality, which limits interpretation. 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   Follow up 12/26/22, NYHA IIIb, markedly volume overload weight up 40 lbs, ReDs 42%. Advised to go to ED for evaluation, where he was admitted. Diuresed with IV lasix. Echo showed EF <20%, LV with GHK, RV mildly reduced, LA/RA sev dilated, mild-mod MR, mild TR, trivial pericardial effusion. GDMT titrated. Dr Lajoyce Corners consulted for chronic osteomyelitis and Unna boot placed on RLE, no indication for surgical intervention. He was discharged home, weight 251 lbs.  Today he returns for post hospital HF follow up. Overall feeling fine. He is not SOB walking on flat ground with his cane. He uses a walking stick for balance. Denies increasing SOB, CP, dizziness, edema, or PND/Orthopnea. Appetite ok. No fever or chills. Weight at home 255 pounds. Taking all medications. Enjoys  fishing, has a Chief Executive Officer.   Cardiac Studies: - Echo (5/24): EF < 20%, LV with GHK, RV mildly reduced, LA/RA sev dilated, mild-mod MR, mild TR, trivial pericardial effusion.   - Bedside echo (9/23): EF 20-25%  - Echo (4/23): EF 20-25%, regional WMA, moderately dilated LV, RV moderately reduced, RVSP 40 mmHg, mild to moderate MR, dilated IVC with estimated RAP 15 mmHg.  - R/LHC (4/23):    The left ventricular ejection fraction is less than 25% by visual estimate.  Ao = 97/67 (85) LV = 105/25 RA = 8 RV = 37/14 PA = 35/22 (25) PCW = 20 Fick cardiac output/index = 5.9/2.4 PVR = 0.8 WU SVR = 1040 Ao sat = 97% PA sat = 66%, 67%   1. Normal coronary arteries 2. 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 Mildly elevated filling pressures with normal cardiac output  cMRI (4/24): poor quality, LVEF 17%, RVEF 22%,  appears to have RV insertion site LGE (which is a nonspecific scar finding often seen in setting of elevated pulmonary pressures) and basal septal midwall LGE (which is a scar pattern seen in NICM and associated with worse prognosis)   Past Medical History:  Diagnosis Date   Diabetes mellitus without complication (HCC)    Heart failure with reduced ejection fraction (HCC) 11/26/2021   Hypertension    Current Outpatient Medications  Medication Sig Dispense Refill   amoxicillin-clavulanate (AUGMENTIN) 875-125 MG tablet Take 1 tablet by mouth 2 (two) times daily for 21 days. 42 tablet 0   Blood Glucose Monitoring Suppl (BLOOD GLUCOSE MONITOR SYSTEM) w/Device KIT Use to check blood  sugars 4 times daily. 1 kit 0   carvedilol (COREG) 3.125 MG tablet Take 1 tablet (3.125 mg total) by mouth 2 (two) times daily with a meal. 60 tablet 0   digoxin (LANOXIN) 0.125 MG tablet Take 1 tablet (0.125 mg total) by mouth daily. 30 tablet 3   doxycycline (ADOXA) 100 MG tablet Take 1 tablet (100 mg total) by mouth 2 (two) times daily for 21 days. 42 tablet 0   furosemide (LASIX) 40 MG tablet Take 1  tablet (40 mg total) by mouth daily. 30 tablet 8   insulin aspart protamine - aspart (NOVOLOG 70/30 MIX) (70-30) 100 UNIT/ML FlexPen Inject 25 Units into the skin 2 (two) times daily with a meal. 15 mL 0   potassium chloride SA (KLOR-CON M) 20 MEQ tablet Take 1 tablet (20 mEq total) by mouth 2 (two) times daily. 120 tablet 8   sacubitril-valsartan (ENTRESTO) 97-103 MG Take 1 tablet by mouth 2 (two) times daily. 60 tablet 3   spironolactone (ALDACTONE) 25 MG tablet Take 1 tablet (25 mg total) by mouth daily. 30 tablet 3   No current facility-administered medications for this encounter.   No Known Allergies  Social History   Socioeconomic History   Marital status: Single    Spouse name: Not on file   Number of children: Not on file   Years of education: Not on file   Highest education level: Not on file  Occupational History   Not on file  Tobacco Use   Smoking status: Never   Smokeless tobacco: Never  Vaping Use   Vaping Use: Never used  Substance and Sexual Activity   Alcohol use: Never   Drug use: Never   Sexual activity: Not on file  Other Topics Concern   Not on file  Social History Narrative   Not on file   Social Determinants of Health   Financial Resource Strain: High Risk (11/29/2021)   Overall Financial Resource Strain (CARDIA)    Difficulty of Paying Living Expenses: Hard  Food Insecurity: Food Insecurity Present (12/28/2022)   Hunger Vital Sign    Worried About Running Out of Food in the Last Year: Often true    Ran Out of Food in the Last Year: Often true  Transportation Needs: No Transportation Needs (11/29/2021)   PRAPARE - Administrator, Civil Service (Medical): No    Lack of Transportation (Non-Medical): No  Physical Activity: Not on file  Stress: Not on file  Social Connections: Not on file  Intimate Partner Violence: Not on file   Family History  Problem Relation Age of Onset   Diabetes Mother    Hypertension Mother    BP 112/86    Pulse (!) 104   Wt 116.3 kg (256 lb 6.4 oz)   SpO2 98%   BMI 35.76 kg/m   Wt Readings from Last 3 Encounters:  01/10/23 116.3 kg (256 lb 6.4 oz)  12/29/22 114.2 kg (251 lb 12.3 oz)  12/26/22 133.2 kg (293 lb 9.6 oz)   PHYSICAL EXAM: General:  NAD. No resp difficulty, walked into clinic with walking stick HEENT: Normal Neck: Supple. No JVD, thick neck. Carotids 2+ bilat; no bruits. No lymphadenopathy or thryomegaly appreciated. Cor: PMI nondisplaced. Tachy rate & rhythm. No rubs, gallops or murmurs. Lungs: Clear Abdomen: Soft, nontender, nondistended. No hepatosplenomegaly. No bruits or masses. Good bowel sounds. Extremities: No cyanosis, clubbing, rash, edema; s/p R TMA Neuro: Alert & oriented x 3, cranial nerves grossly intact. Moves all  4 extremities w/o difficulty. Affect pleasant.  ReDs: 31%  ECG (personally reviewed): ST 107 bpm  ASSESSMENT & PLAN:  Chronic Systolic Heart Failure - NICM. Reports hx CHF in his mother. No hx alcohol or drug abuse. 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 RVEF 22% LGE imaging very poor quality, which limits interpretation. Appears to have RV insertion site LGE (associated with worse prognosis) - Echo (5/24): EF <20%, LV with GHK, RV mildly reduced, LA/RA sev dilated, mild-mod MR, mild TR, trivial pericardial effusion.  - Improved NYHA II-early III, volume OK today. ReDs 31% - Increase Coreg to 6.25 mg bid - Continue digoxin 0.125 mg daily. - Continue Lasix 40 mg daily. Discussed taking extra PRN - Continue Entresto 97/103 mg bid. - Continue spiro 25 mg daily. - No SGLT2i with uncontrolled DM and concern for hygiene. - Labs today. Check dig trough next visit  (took digoxin already today) - Not candidate for ICD with ongoing LE wounds.   2. Uncontrolled DM2 - Most recent A1C 11.2% - He is on insulin - Per PCP   3. Hx osteomyelitis s/p right TMA - 2/2 poorly controlled DM   4. HTN - BP controlled - GDMT as above   5. Hypoalbuminemia - Liver US with echogenic parenchyma, ? Hepatic steatosis or fibrosis. Cannot exclude cirrhosis. - hepC antibody NR  -? Secondary to RV failure   6. SDOH  - Now insured. - Lives in a camper behind father's house. Unemployed and has limited social support. - Needs Paramedicine but he has refulsed, HFSW helping with disability/food stamps  Follow up in 4-6 weeks with APP and 3 months with Dr. Gala Romney. He is high-risk for re-admit.  Anderson Malta [place], FNP-BC  01/10/23 9:12 AM

## 2023-01-09 ENCOUNTER — Other Ambulatory Visit (HOSPITAL_COMMUNITY): Payer: Self-pay

## 2023-01-10 ENCOUNTER — Ambulatory Visit (INDEPENDENT_AMBULATORY_CARE_PROVIDER_SITE_OTHER): Payer: 59 | Admitting: Student

## 2023-01-10 ENCOUNTER — Ambulatory Visit (HOSPITAL_COMMUNITY)
Admit: 2023-01-10 | Discharge: 2023-01-10 | Disposition: A | Discharge status: Home / Self Care | Payer: 59 | Attending: Family Medicine | Admitting: Family Medicine

## 2023-01-10 ENCOUNTER — Encounter: Payer: Self-pay | Admitting: Student

## 2023-01-10 ENCOUNTER — Encounter (HOSPITAL_COMMUNITY): Payer: Self-pay

## 2023-01-10 VITALS — BP 112/86 | HR 104 | Wt 256.4 lb

## 2023-01-10 VITALS — BP 130/89 | HR 109 | Wt 257.1 lb

## 2023-01-10 DIAGNOSIS — I5082 Biventricular heart failure: Secondary | ICD-10-CM | POA: Insufficient documentation

## 2023-01-10 DIAGNOSIS — Z608 Other problems related to social environment: Secondary | ICD-10-CM | POA: Insufficient documentation

## 2023-01-10 DIAGNOSIS — I5020 Unspecified systolic (congestive) heart failure: Secondary | ICD-10-CM

## 2023-01-10 DIAGNOSIS — I872 Venous insufficiency (chronic) (peripheral): Secondary | ICD-10-CM

## 2023-01-10 DIAGNOSIS — L97421 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 to breakdown of skin: Secondary | ICD-10-CM | POA: Diagnosis not present

## 2023-01-10 DIAGNOSIS — Z79899 Other long term (current) drug therapy: Secondary | ICD-10-CM | POA: Diagnosis not present

## 2023-01-10 DIAGNOSIS — I429 Cardiomyopathy, unspecified: Secondary | ICD-10-CM

## 2023-01-10 DIAGNOSIS — E8809 Other disorders of plasma-protein metabolism, not elsewhere classified: Secondary | ICD-10-CM | POA: Insufficient documentation

## 2023-01-10 DIAGNOSIS — E1169 Type 2 diabetes mellitus with other specified complication: Secondary | ICD-10-CM | POA: Diagnosis not present

## 2023-01-10 DIAGNOSIS — Z794 Long term (current) use of insulin: Secondary | ICD-10-CM

## 2023-01-10 DIAGNOSIS — Z5986 Financial insecurity: Secondary | ICD-10-CM | POA: Insufficient documentation

## 2023-01-10 DIAGNOSIS — R600 Localized edema: Secondary | ICD-10-CM | POA: Diagnosis not present

## 2023-01-10 DIAGNOSIS — I428 Other cardiomyopathies: Secondary | ICD-10-CM | POA: Diagnosis not present

## 2023-01-10 DIAGNOSIS — E1151 Type 2 diabetes mellitus with diabetic peripheral angiopathy without gangrene: Secondary | ICD-10-CM | POA: Diagnosis not present

## 2023-01-10 DIAGNOSIS — E1159 Type 2 diabetes mellitus with other circulatory complications: Secondary | ICD-10-CM

## 2023-01-10 DIAGNOSIS — I739 Peripheral vascular disease, unspecified: Secondary | ICD-10-CM

## 2023-01-10 DIAGNOSIS — Z139 Encounter for screening, unspecified: Secondary | ICD-10-CM | POA: Diagnosis not present

## 2023-01-10 DIAGNOSIS — I83018 Varicose veins of right lower extremity with ulcer other part of lower leg: Secondary | ICD-10-CM

## 2023-01-10 DIAGNOSIS — Z560 Unemployment, unspecified: Secondary | ICD-10-CM | POA: Diagnosis not present

## 2023-01-10 DIAGNOSIS — I5022 Chronic systolic (congestive) heart failure: Secondary | ICD-10-CM | POA: Diagnosis not present

## 2023-01-10 DIAGNOSIS — I10 Essential (primary) hypertension: Secondary | ICD-10-CM

## 2023-01-10 DIAGNOSIS — M86172 Other acute osteomyelitis, left ankle and foot: Secondary | ICD-10-CM | POA: Diagnosis not present

## 2023-01-10 DIAGNOSIS — Z8249 Family history of ischemic heart disease and other diseases of the circulatory system: Secondary | ICD-10-CM | POA: Insufficient documentation

## 2023-01-10 DIAGNOSIS — Z8739 Personal history of other diseases of the musculoskeletal system and connective tissue: Secondary | ICD-10-CM | POA: Diagnosis not present

## 2023-01-10 DIAGNOSIS — Z89421 Acquired absence of other right toe(s): Secondary | ICD-10-CM | POA: Insufficient documentation

## 2023-01-10 DIAGNOSIS — E11621 Type 2 diabetes mellitus with foot ulcer: Secondary | ICD-10-CM | POA: Diagnosis not present

## 2023-01-10 DIAGNOSIS — E1165 Type 2 diabetes mellitus with hyperglycemia: Secondary | ICD-10-CM | POA: Diagnosis not present

## 2023-01-10 DIAGNOSIS — Z91148 Patient's other noncompliance with medication regimen for other reason: Secondary | ICD-10-CM | POA: Diagnosis not present

## 2023-01-10 DIAGNOSIS — I110 Hypertensive heart disease with heart failure: Secondary | ICD-10-CM | POA: Diagnosis not present

## 2023-01-10 LAB — BASIC METABOLIC PANEL
Anion gap: 5 (ref 5–15)
BUN: 22 mg/dL — ABNORMAL HIGH (ref 6–20)
CO2: 30 mmol/L (ref 22–32)
Calcium: 8.4 mg/dL — ABNORMAL LOW (ref 8.9–10.3)
Chloride: 99 mmol/L (ref 98–111)
Creatinine, Ser: 0.94 mg/dL (ref 0.61–1.24)
GFR, Estimated: >60 mL/min (ref >60)
Glucose, Bld: 229 mg/dL — ABNORMAL HIGH (ref 70–99)
Potassium: 4.4 mmol/L (ref 3.5–5.1)
Sodium: 134 mmol/L — ABNORMAL LOW (ref 135–145)

## 2023-01-10 LAB — BRAIN NATRIURETIC PEPTIDE: B Natriuretic Peptide: 855.2 pg/mL — ABNORMAL HIGH (ref 0.0–100.0)

## 2023-01-10 MED ORDER — CARVEDILOL 6.25 MG PO TABS: 6.2500 mg | ORAL_TABLET | Freq: Two times a day (BID) | ORAL | 3 refills | Status: DC

## 2023-01-10 NOTE — Patient Instructions (Addendum)
Thank you for coming in today  If you had labs drawn today, any labs that are abnormal the clinic will call you No news is good news  Medications: Increase Coreg to 6.25 mg twice daily   HOLD DIGOXIN MEDICATION ON YOUR NEXT APPOINTMENT   Follow up appointments:  Your physician recommends that you schedule a follow-up appointment in:  4-6 weeks in clinic 3 months With Dr. Gala Romney    Do the following things EVERYDAY: Weigh yourself in the morning before breakfast. Write it down and keep it in a log. Take your medicines as prescribed Eat low salt foods--Limit salt (sodium) to 2000 mg per day.  Stay as active as you can everyday Limit all fluids for the day to less than 2 liters   At the Advanced Heart Failure Clinic, you and your health needs are our priority. As part of our continuing mission to provide you with exceptional heart care, we have created designated Provider Care Teams. These Care Teams include your primary Cardiologist (physician) and Advanced Practice Providers (APPs- Physician Assistants and Nurse Practitioners) who all work together to provide you with the care you need, when you need it.   You may see any of the following providers on your designated Care Team at your next follow up: Dr Arvilla Meres Dr Marca Ancona Dr. Marcos Eke, NP Robbie Lis, [place] [facility] [place], [place] Brynda Peon, NP Karle Plumber, PharmD   Please be sure to bring in all your medications bottles to every appointment.    Thank you for choosing [facility]  If you have any questions or concerns before your next appointment please send [place] a message through [place] or call our office at [phone].    TO LEAVE A MESSAGE FOR THE NURSE SELECT OPTION 2, PLEASE LEAVE A MESSAGE INCLUDING: YOUR NAME DATE OF BIRTH CALL BACK NUMBER REASON FOR CALL**this is important as we prioritize the call backs  YOU WILL  RECEIVE A CALL BACK THE SAME DAY AS LONG AS YOU CALL BEFORE 4:00 PM

## 2023-01-10 NOTE — Assessment & Plan Note (Signed)
Patient recently discharged from IM inpatient teaching service for decompensated heart failure in setting of medication nonadherence secondary to financial hardship.   During admission, TTE with left ventricular ejection fraction, by estimation, is <20%. With severe LV dysfunction and hypokinesis. Patient received high doses of diuresis with over 13L of net negative fluid removal. Dry weight prior to discharge was 251 lb. Patient discharged on increased dose of Entresto and decreased Carvedilol dose.  Patient reports improvement of symptoms and adherence totherapy since discharge. Has been monitoring his ankles and abdomen for edema as he does not have a weight scale at home.  He was seen by Cardiology this AM and understands medication changes.  Appears euvolemic today. VS stable.   Of note, on 4/23 [facility] with normal coronaries. 4/24 cMRI with  poor quality, LVEF 17%, RVEF 22%,  appears to have RV insertion site LGE (which is a nonspecific scar finding often seen in setting of elevated pulmonary pressures) and basal septal midwall LGE (which is a scar pattern seen in NICM and associated with worse prognosis per Cardiology notes. Patient is not currently a candidate for ICD because lower extremity wounds.  Plan: Reviewed the recent changes to medications after Cardio appointemnt - Increase Coreg to 6.25 mg bid - Continue digoxin 0.125 mg daily. - Continue Lasix 40 mg daily and when to take PRN medication - Continue Entresto 97/103 mg bid. - Continue spiro 25 mg daily.

## 2023-01-10 NOTE — Assessment & Plan Note (Addendum)
Patient with persistent hyperglycemia and A1c of 12.2 on 12/2022. Previously on insulin Aspart 15 units BID with meals, transitioned to 70/30 25 units BID with meals after hospital discharge.  Patient owns but does not like using glucometer. Denies experiencing hypoglycemic episodes. However, patient misunderstood dosing -- he thought he should be taking 35 units BID with meals and thought it was too much, so he decreased to 10 and 5.   Discussed need for better diabetes control, especially with L lower extremity wounds, currrent treatment for osteomyelitis, and overall cardiovascular risk.  Patient has not been on statin. Last Lipid panel was a year ago was with LDL of 53 and TCHOL 93. He has no history of DKA, and has not tried SGLT2i. Attempted to transition patient from Insulin 70/30 to Lantus 25; however, patient reported that he has a fuill supply of 70/30 at home she would like to finish first.  Diabetic ulcer of L heel improved compared to inpatient service photos. MRI showed ulcers extending to fascia without evidence of abscess or osteomylitis of the L calcaneous.    Instructed patient to measure fasting and meal time glucose for the next week and bring readings to guide insulin adjustment. Will refer to diabetes educator for further diabetes management education and for trial of cGM now that patient has insurance coverage.  Patient denied getting more laboratory work up done today after seeing cardiology - Follow up in one week with glucose readings - Continue 70/30 25units BID with meals - Referral to diabetes education at [facility] - Referral to wound care center for deep L foot heel ulcer - Patient did not wait for wrapping of L foot heel dressing during this visit.

## 2023-01-10 NOTE — Progress Notes (Signed)
Heart and Vascular Care Navigation  01/10/2023  Dakota Ramirez 09-Aug-1971 [id number]  Reason for Referral: Food stamp and disability assistance.   Engaged with patient face to face for follow up visit for Heart and Vascular Care Coordination.                                                                                                   Assessment:   CSW met with pt to assist with above.  Pt confirms he does not have food stamps and would benefit from it. Thinks he was granted it at some point and it was taken away before he could even use it.  Pt also reports he doesn't have disability case pending- was denied in the past.    CSW placed referral through [id number] to [facility] for food stamps and Medicaid.  Last time applied for Medicaid was over reserve limit.  Pt has no income but owns several cars that he works on himself.  CSW then placed referral to [facility] for disability application assistance.  Pt denies further needs at this time.                                   HRT/VAS Care Coordination     Outpatient Care Team Community Paramedicine   Community Paramedic Name: referred to [facility] paramedicine   Living arrangements for the past 2 months Mobile Home   Lives with: Self   Patient Current Insurance Coverage Self-Pay   Patient Has Concern With Paying Medical Bills Yes   Medical Bill Referrals: [employer] helping with Medicaid   Does Patient Have Prescription Coverage? No   Home Assistive Devices/Equipment None   Current home services DME  Rolling Walker, cane, walking stick, scale       Social History:                                                                             SDOH Screenings   Food Insecurity: Food Insecurity Present (12/28/2022)  Housing: Patient Declined (12/28/2022)  Transportation Needs: No Transportation Needs (11/29/2021)  Utilities: Not At Risk (12/28/2022)  Financial Resource Strain: High Risk (01/10/2023)  Tobacco  Use: Low Risk  (01/10/2023)    SDOH Interventions: Financial Resources:  Financial Strain Interventions: Other (Comment) [facility] referral for disability) No source of income at this time  Food Insecurity:  Food Insecurity Interventions: Assist with [employer]  Housing Insecurity:  None reported  Transportation:   Has vehicles- had friend drive to appts today.    Follow-up plan:    Pt to work with [facility] and [facility] to complete medicaid/foodstamp application and disability application.  Burna Sis, LCSW Clinical Social Worker [facility] Desk#: [phone]  Cell#: [phone]

## 2023-01-10 NOTE — Assessment & Plan Note (Addendum)
Currently improved with Unna boot placed during hospitalization. Improvement in appearance of skin with resolution of wound previously described on discharge instructions. No signs of cellulitis or serous drainage today  - Unna boot replaced in clinic today

## 2023-01-10 NOTE — Assessment & Plan Note (Addendum)
Chronic osteomyelitis of L toe seen on MRI. ABI without signs of PVD. on surgical treatment with 3 weeks of oral antibiotics, which would end on 6/7. After orthopedic surgery evaluation, patient was started on Augmentin and Doxycycline on the day of discharge. Patient reports adherence to medication without side effects.   Wound examined today and it is without purulence or drainage. Overall, there is decreased edema of L great toe. No fluctuance or tenderness to palpation.  - Continue course of antibiotics - Provided patient with information for Dr. Audrie Lia practice for follow up - Patient education on Medihoney; patient unsure whether he can take care of this.  - Referral to wound clinic placed as there is high risk for poor wound healing. Will continue monitoring at the 1 week follow up

## 2023-01-10 NOTE — Progress Notes (Signed)
ReDS Vest / Clip - 01/10/23 0900       ReDS Vest / Clip   Station Marker D    Ruler Value 38    ReDS Value Range Low volume    ReDS Actual Value 31

## 2023-01-10 NOTE — Assessment & Plan Note (Addendum)
Diabetic ulcer of L heel improved compared to inpatient service photos. MRI showed ulcers extending to fascia without evidence of abscess or osteomylitis of the L calcaneous.   - wound care referral placed; scheduled for 01/28/2023

## 2023-01-10 NOTE — Progress Notes (Signed)
Subjective:  CC: HFU  HPI:  [name] is a 52 y.o. male with a past medical history stated below and presents today for hospital follow up for acute heart failure, L great toe osteomyelitis, and hyperglycemia. Please see problem based assessment and plan for additional details.  Past Medical History:  Diagnosis Date   Diabetes mellitus without complication (HCC)    Heart failure with reduced ejection fraction (HCC) 11/26/2021   Hypertension     Current Outpatient Medications on File Prior to Visit  Medication Sig Dispense Refill   amoxicillin-clavulanate (AUGMENTIN) 875-125 MG tablet Take 1 tablet by mouth 2 (two) times daily for 21 days. 42 tablet 0   Blood Glucose Monitoring Suppl (BLOOD GLUCOSE MONITOR SYSTEM) w/Device KIT Use to check blood sugars 4 times daily. 1 kit 0   carvedilol (COREG) 6.25 MG tablet Take 1 tablet (6.25 mg total) by mouth 2 (two) times daily. 180 tablet 3   digoxin (LANOXIN) 0.125 MG tablet Take 1 tablet (0.125 mg total) by mouth daily. 30 tablet 3   doxycycline (ADOXA) 100 MG tablet Take 1 tablet (100 mg total) by mouth 2 (two) times daily for 21 days. 42 tablet 0   furosemide (LASIX) 40 MG tablet Take 1 tablet (40 mg total) by mouth daily. 30 tablet 8   insulin aspart protamine - aspart (NOVOLOG 70/30 MIX) (70-30) 100 UNIT/ML FlexPen Inject 25 Units into the skin 2 (two) times daily with a meal. 15 mL 0   potassium chloride SA (KLOR-CON M) 20 MEQ tablet Take 1 tablet (20 mEq total) by mouth 2 (two) times daily. 120 tablet 8   sacubitril-valsartan (ENTRESTO) 97-103 MG Take 1 tablet by mouth 2 (two) times daily. 60 tablet 3   spironolactone (ALDACTONE) 25 MG tablet Take 1 tablet (25 mg total) by mouth daily. 30 tablet 3   No current facility-administered medications on file prior to visit.    Family History  Problem Relation Age of Onset   Diabetes Mother    Hypertension Mother     Social History   Socioeconomic History   Marital status:  Single    Spouse name: Not on file   Number of children: Not on file   Years of education: Not on file   Highest education level: Not on file  Occupational History   Not on file  Tobacco Use   Smoking status: Never   Smokeless tobacco: Never  Vaping Use   Vaping Use: Never used  Substance and Sexual Activity   Alcohol use: Never   Drug use: Never   Sexual activity: Not on file  Other Topics Concern   Not on file  Social History Narrative   Not on file   Social Determinants of Health   Financial Resource Strain: High Risk (01/10/2023)   Overall Financial Resource Strain (CARDIA)    Difficulty of Paying Living Expenses: Hard  Food Insecurity: Food Insecurity Present (12/28/2022)   Hunger Vital Sign    Worried About Running Out of Food in the Last Year: Often true    Ran Out of Food in the Last Year: Often true  Transportation Needs: No Transportation Needs (11/29/2021)   PRAPARE - Administrator, Civil Service (Medical): No    Lack of Transportation (Non-Medical): No  Physical Activity: Not on file  Stress: Not on file  Social Connections: Not on file  Intimate Partner Violence: Not on file    Review of Systems: ROS negative except for what  is noted on the assessment and plan.  Objective:   Vitals:   01/10/23 1041  BP: 130/89  Pulse: (!) 109  SpO2: 97%  Weight: 257 lb 1.6 oz (116.6 kg)    Physical Exam: Constitutional: well-appearing gentleman sitting in exam chair, in no acute distress HENT: normocephalic atraumatic, mucous membranes moist Eyes: conjunctiva non-erythematous Neck: supple Cardiovascular: regular rate and rhythm, no m/r/g Pulmonary/Chest: normal work of breathing on room air, lungs clear to auscultation bilaterally Abdominal: soft, non-tender, non-distended, no dependent edema on bilateral flanks MSK: normal bulk and tone. 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   During admission, TTE with left ventricular ejection fraction, by estimation, is <20%. With severe LV dysfunction and hypokinesis. Patient received high doses of diuresis with over 13L of net negative fluid removal. Dry weight prior to discharge was 251 lb. Patient discharged on increased dose of Entresto and decreased Carvedilol dose.  Patient reports improvement of symptoms and adherence totherapy since discharge. Has been monitoring his ankles and abdomen for edema as he does not have a weight scale at home.  He was seen by Cardiology this AM and understands medication changes.  Appears euvolemic today. VS stable.   Of note, on 4/23 [facility] with normal coronaries. 4/24 cMRI with  poor quality, LVEF 17%, RVEF 22%,  appears to have RV insertion site LGE (which is a nonspecific scar finding often seen in setting of elevated pulmonary pressures) and basal septal midwall LGE (which is a scar pattern seen in NICM and associated with worse prognosis per Cardiology notes. Patient is not currently a candidate for ICD because lower extremity wounds.  Plan: Reviewed the recent changes to medications after Cardio appointemnt - Increase Coreg to 6.25 mg bid - Continue digoxin  0.125 mg daily. - Continue Lasix 40 mg daily and when to take PRN medication - Continue Entresto 97/103 mg bid. - Continue spiro 25 mg daily.  Type 2 diabetes mellitus with foot ulcer, with long-term current use of insulin (HCC) Patient with persistent hyperglycemia and A1c of 12.2 on 12/2022. Previously on insulin Aspart 15 units BID with meals, transitioned to 70/30 25 units BID with meals after hospital discharge.  Patient owns but does not like using glucometer. Denies experiencing hypoglycemic episodes. However, patient misunderstood dosing -- he thought he should be taking 35 units BID with meals and thought it was too much, so he decreased to 10 and 5.   Discussed need for better diabetes control, especially with L lower extremity wounds, currrent treatment for osteomyelitis, and overall cardiovascular risk.  Patient has not been on statin. Last Lipid panel was a year ago was with LDL of 53 and TCHOL 93. He has no history of DKA, and has not tried SGLT2i. Attempted to transition patient from Insulin 70/30  to Lantus 25; however, patient reported that he has a fuill supply of 70/30 at home she would like to finish first.  Diabetic ulcer of L heel improved compared to inpatient service photos. MRI showed ulcers extending to fascia without evidence of abscess or osteomylitis of the L calcaneous.    Instructed patient to measure fasting and meal time glucose for the next week and bring readings to guide insulin adjustment. Will refer to diabetes educator for further diabetes management education and for trial of cGM now that patient has insurance coverage.  Patient denied getting more laboratory work up done today after seeing cardiology - Follow up in one week with glucose readings - Continue 70/30 25units BID with meals - Referral to diabetes education at [facility] - Referral to wound care center for deep L foot heel ulcer - Patient did not wait for wrapping of L foot heel dressing during this visit.    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 ABI without signs of PVD. on surgical treatment with 3 weeks of oral antibiotics, which would end on 6/7. After orthopedic surgery evaluation, patient was started on Augmentin and Doxycycline on the day of discharge. Patient reports adherence to medication without side effects.   Wound examined today and it is without purulence or drainage. Overall, there is decreased edema of L great toe. No fluctuance or tenderness to palpation.  - Continue course of antibiotics - Provided patient with information for Dr. Audrie Lia practice for follow up - Patient education on Medihoney; patient unsure whether he can take care of this.  - Referral to wound clinic placed as there is high risk for poor wound healing. 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 MRI showed ulcers extending to fascia without evidence of abscess or osteomylitis of the L calcaneous.   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 Improvement in appearance of skin with resolution of wound previously described on discharge instructions. No signs of cellulitis or serous drainage today  - Unna boot replaced in clinic today    Return in about 3 months (around 04/12/2023) for Henriette Combs and DM - please make appointment with Lupita Leash.   Patient discussed with Dr. Rosalia Hammers, MD [facility] Internal Medicine Program - PGY-1 01/10/2023, 3:24 PM

## 2023-01-10 NOTE — Assessment & Plan Note (Signed)
Recent ABIs without signs of PAD

## 2023-01-10 NOTE — Patient Instructions (Addendum)
Thank you, Mr.Darus Linwood Dibbles for allowing [place] to provide your care today. Today we discussed .    For your heart: Please get a balance t monitor your weights  Monitor your fluid status in the abdomen  For your feet -Appointment with Orthopedic surgeon - you tell him that you need to make an appointment after being in seen in the hospital His name is Aldean Baker [facility] [phone]  Keep taking the antibiotics until 01/18/2023  Diabetes - Insulin 70/30 - please do 25 units in the morning and 25 units at night with your meals - Measure your finger blood sugar every morning before you eat. That way we know what it is when you are fasting - Referral to the diabetes education in our clinic so that she can see if you can try the device that checks the blood sugar in your arm  My Chart Access: [email or web address]  Please follow-up in: 1 week to change your unna boot    We look forward to seeing you next time. Please call our clinic at [phone] if you have any questions or concerns. The best time to call is Monday-Friday from 9am-4pm, but there is someone available 24/7. If after hours or the weekend, call the main hospital number and ask for the Internal Medicine Resident On-Call. If you need medication refills, please notify your pharmacy one week in advance and they will send [place] a request.   Thank you for letting us take part in your care. Wishing you the best!  Morene Crocker, MD 01/10/2023, 10:53 AM [facility] Internal Medicine Resident, PGY-1

## 2023-01-10 NOTE — Addendum Note (Signed)
Encounter addended by: Burna Sis, LCSW on: 01/10/2023 10:29 AM  Actions taken: Flowsheet accepted, Clinical Note Signed

## 2023-01-11 ENCOUNTER — Telehealth (HOSPITAL_COMMUNITY): Payer: Self-pay

## 2023-01-11 DIAGNOSIS — I5022 Chronic systolic (congestive) heart failure: Secondary | ICD-10-CM

## 2023-01-11 MED ORDER — FUROSEMIDE 40 MG PO TABS: 60.0000 mg | ORAL_TABLET | Freq: Every day | ORAL | 5 refills | Status: DC

## 2023-01-11 NOTE — Telephone Encounter (Addendum)
Patient advised and verbalized understanding,lab appointment scheduled,lab orders entered,med list update to reflect changes.   Meds ordered this encounter  Medications   DISCONTD: furosemide (LASIX) 40 MG tablet    Sig: Take 1.5 tablets (60 mg total) by mouth daily.    Dispense:  90 tablet    Refill:  5    Please cancel all previous orders for current medication. Change in dosage or pill size.   furosemide (LASIX) 40 MG tablet    Sig: Take 1.5 tablets (60 mg total) by mouth daily.    Dispense:  45 tablet    Refill:  5    Please cancel all previous orders for current medication. Change in dosage or pill size.   Orders Placed This Encounter  Procedures   Basic metabolic panel    Standing Status:   Future    Standing Expiration Date:   01/11/2024    Order Specific Question:   Release to patient    Answer:   Immediate    Order Specific Question:   Release to patient    Answer:   Immediate [1]

## 2023-01-11 NOTE — Telephone Encounter (Signed)
-----   Message from Jacklynn Ganong, [place] sent at 01/11/2023  4:40 PM EDT ----- Labs ok but BNP creeping up. Worrisome for fluid overload.  Increase Lasix to 60 mg daily. Repeat BMET in 10 days

## 2023-01-11 NOTE — Progress Notes (Signed)
Internal Medicine Clinic Attending  Case discussed with Dr. Gomez-Caraballo  At the time of the visit.  We reviewed the resident's history and exam and pertinent patient test results.  I agree with the assessment, diagnosis, and plan of care documented in the resident's note.  

## 2023-01-17 ENCOUNTER — Encounter (HOSPITAL_BASED_OUTPATIENT_CLINIC_OR_DEPARTMENT_OTHER): Payer: 59 | Attending: Internal Medicine | Admitting: Internal Medicine

## 2023-01-17 ENCOUNTER — Encounter: Payer: Self-pay | Admitting: Student

## 2023-01-17 ENCOUNTER — Ambulatory Visit (INDEPENDENT_AMBULATORY_CARE_PROVIDER_SITE_OTHER): Payer: 59 | Admitting: Student

## 2023-01-17 ENCOUNTER — Telehealth: Payer: Self-pay | Admitting: Dietician

## 2023-01-17 VITALS — BP 149/99 | HR 109 | Wt 254.0 lb

## 2023-01-17 DIAGNOSIS — E1169 Type 2 diabetes mellitus with other specified complication: Secondary | ICD-10-CM

## 2023-01-17 DIAGNOSIS — E1142 Type 2 diabetes mellitus with diabetic polyneuropathy: Secondary | ICD-10-CM | POA: Diagnosis not present

## 2023-01-17 DIAGNOSIS — I429 Cardiomyopathy, unspecified: Secondary | ICD-10-CM

## 2023-01-17 DIAGNOSIS — L97423 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: Secondary | ICD-10-CM | POA: Diagnosis not present

## 2023-01-17 DIAGNOSIS — L97522 Non-pressure chronic ulcer of other part of left foot with fat layer exposed: Secondary | ICD-10-CM | POA: Diagnosis not present

## 2023-01-17 DIAGNOSIS — E11621 Type 2 diabetes mellitus with foot ulcer: Secondary | ICD-10-CM

## 2023-01-17 DIAGNOSIS — Z794 Long term (current) use of insulin: Secondary | ICD-10-CM

## 2023-01-17 DIAGNOSIS — I5022 Chronic systolic (congestive) heart failure: Secondary | ICD-10-CM | POA: Insufficient documentation

## 2023-01-17 DIAGNOSIS — L97421 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 to breakdown of skin: Secondary | ICD-10-CM | POA: Diagnosis not present

## 2023-01-17 DIAGNOSIS — L97512 Non-pressure chronic ulcer of other part of right foot with fat layer exposed: Secondary | ICD-10-CM | POA: Diagnosis not present

## 2023-01-17 DIAGNOSIS — L97524 Non-pressure chronic ulcer of other part of left foot with necrosis of bone: Secondary | ICD-10-CM | POA: Insufficient documentation

## 2023-01-17 DIAGNOSIS — L97422 Non-pressure chronic ulcer of left heel and midfoot with fat layer exposed: Secondary | ICD-10-CM | POA: Diagnosis not present

## 2023-01-17 DIAGNOSIS — M86672 Other chronic osteomyelitis, left ankle and foot: Secondary | ICD-10-CM | POA: Insufficient documentation

## 2023-01-17 DIAGNOSIS — I5020 Unspecified systolic (congestive) heart failure: Secondary | ICD-10-CM | POA: Diagnosis not present

## 2023-01-17 DIAGNOSIS — M86172 Other acute osteomyelitis, left ankle and foot: Secondary | ICD-10-CM | POA: Diagnosis not present

## 2023-01-17 DIAGNOSIS — L97518 Non-pressure chronic ulcer of other part of right foot with other specified severity: Secondary | ICD-10-CM | POA: Insufficient documentation

## 2023-01-17 NOTE — Assessment & Plan Note (Signed)
Improvement today with Radio broadcast assistant. Patient has been referred and will be following with wound care today in the PM.

## 2023-01-17 NOTE — Assessment & Plan Note (Addendum)
Patient is compliant to antibiotics, which should be finished on Saturday. Decreased pain, edema, and erythema around L great toe. Patient was reminded of need for follow up at orthopedic clinic. Phone number was provided again in writing on AVS.

## 2023-01-17 NOTE — Telephone Encounter (Signed)
Tried calling this patient per Dr. Theophilus Bones who wants him to meet with me and to begin using CGM. It appears Dexcom is in network so need to find out if his phone is compatible to use as a reader.. Their voicemail box is full. I was unable to leave a message

## 2023-01-17 NOTE — Patient Instructions (Addendum)
Thank you, Mr.Humphrey Linwood Dibbles for allowing [place] to provide your care today. Today we discussed .    You need to make an appointment with the doctor who is treating your L great toe bone infection: Orthopedic doctor [facility] [phone]  - please finish the course of antibiotics this upcoming Saturday  Diabetes  Keep taking insulin 70/30 twice daily with meals A member of our staff will call to schedule education with out diabetes coordinator for the continue blood sugar meter  Referrals: - make sure you follow up with the wound clinic this afternoon  New medications: -Pick up and start taking Lasix 60 mg as instructed by your cardiologist - You will have a blood test done next week and need to follow with cardiology  If chest pain, swelling, or worsening breathing, please call us or be seen by a doctor  My Chart Access: [email or web address]  Please follow-up in: 2 months    We look forward to seeing you next time. Please call our clinic at [phone] if you have any questions or concerns. The best time to call is Monday-Friday from 9am-4pm, but there is someone available 24/7. If after hours or the weekend, call the main hospital number and ask for the Internal Medicine Resident On-Call. If you need medication refills, please notify your pharmacy one week in advance and they will send [place] a request.   Thank you for letting us take part in your care. Wishing you the best!  Morene Crocker, MD 01/17/2023, 10:43 AM [facility] Internal Medicine Resident, PGY-1

## 2023-01-17 NOTE — Assessment & Plan Note (Signed)
Read recent notes from HF clinic about uptrending BNP. Patient was aware of this but has not started Lasix 60 mg. Discuss need to monitor weights, adherence to medication changes to avoid volume overload and risk of hospitalization.  254 weight today from 257 lb on 5/30. Mildly hypertensive. Does not appear grossly volume overload.   Patient will pick up Lasix today and start with 60 mg daily. Aware of 6/11 lab only appointment and future cardiology follow up.

## 2023-01-17 NOTE — Progress Notes (Signed)
Subjective:  CC: Diabetes follow up  HPI:  [name] is a 52 y.o. male with a past medical history stated below and presents today for diabetes follow up. Please see problem based assessment and plan for additional details.  Past Medical History:  Diagnosis Date   Diabetes mellitus without complication (HCC)    Heart failure with reduced ejection fraction (HCC) 11/26/2021   Hypertension     Current Outpatient Medications on File Prior to Visit  Medication Sig Dispense Refill   amoxicillin-clavulanate (AUGMENTIN) 875-125 MG tablet Take 1 tablet by mouth 2 (two) times daily for 21 days. 42 tablet 0   Blood Glucose Monitoring Suppl (BLOOD GLUCOSE MONITOR SYSTEM) w/Device KIT Use to check blood sugars 4 times daily. 1 kit 0   carvedilol (COREG) 6.25 MG tablet Take 1 tablet (6.25 mg total) by mouth 2 (two) times daily. 180 tablet 3   digoxin (LANOXIN) 0.125 MG tablet Take 1 tablet (0.125 mg total) by mouth daily. 30 tablet 3   doxycycline (ADOXA) 100 MG tablet Take 1 tablet (100 mg total) by mouth 2 (two) times daily for 21 days. 42 tablet 0   furosemide (LASIX) 40 MG tablet Take 1.5 tablets (60 mg total) by mouth daily. 45 tablet 5   insulin aspart protamine - aspart (NOVOLOG 70/30 MIX) (70-30) 100 UNIT/ML FlexPen Inject 25 Units into the skin 2 (two) times daily with a meal. 15 mL 0   potassium chloride SA (KLOR-CON M) 20 MEQ tablet Take 1 tablet (20 mEq total) by mouth 2 (two) times daily. 120 tablet 8   sacubitril-valsartan (ENTRESTO) 97-103 MG Take 1 tablet by mouth 2 (two) times daily. 60 tablet 3   spironolactone (ALDACTONE) 25 MG tablet Take 1 tablet (25 mg total) by mouth daily. 30 tablet 3   No current facility-administered medications on file prior to visit.    Family History  Problem Relation Age of Onset   Diabetes Mother    Hypertension Mother     Social History   Socioeconomic History   Marital status: Single    Spouse name: Not on file   Number of  children: Not on file   Years of education: Not on file   Highest education level: Not on file  Occupational History   Not on file  Tobacco Use   Smoking status: Never   Smokeless tobacco: Never  Vaping Use   Vaping Use: Never used  Substance and Sexual Activity   Alcohol use: Never   Drug use: Never   Sexual activity: Not on file  Other Topics Concern   Not on file  Social History Narrative   Not on file   Social Determinants of Health   Financial Resource Strain: High Risk (01/10/2023)   Overall Financial Resource Strain (CARDIA)    Difficulty of Paying Living Expenses: Hard  Food Insecurity: Food Insecurity Present (12/28/2022)   Hunger Vital Sign    Worried About Running Out of Food in the Last Year: Often true    Ran Out of Food in the Last Year: Often true  Transportation Needs: No Transportation Needs (11/29/2021)   PRAPARE - Administrator, Civil Service (Medical): No    Lack of Transportation (Non-Medical): No  Physical Activity: Not on file  Stress: Not on file  Social Connections: Not on file  Intimate Partner Violence: Not on file    Review of Systems: ROS negative except for what is noted on the assessment and plan.  Objective:   Vitals:   01/17/23 1031 01/17/23 1357  BP: (!) 149/99   Pulse: (!) 109   SpO2: 100%   Weight:  254 lb (115.2 kg)    Physical Exam: Constitutional: well-appearing male sitting in chair, in no acute distress HENT: normocephalic atraumatic, mucous membranes moist Eyes: conjunctiva non-erythematous Neck: supple Cardiovascular: regular rate and rhythm, no m/r/g Pulmonary/Chest: normal work of breathing on room air, lungs clear to auscultation bilaterally Abdominal: soft, non-tender, non-distended MSK: normal bulk and tone. R foot with transmetatarsal amputation Neurological: alert & oriented x 3 Skin: warm and dry R LE without edema and healing medial lower extremity wound. Clean. No drainage, erythema, changes in  temperature or fluctuance. Bilateral LE with hemosiderin staning. L great toe with open ulcer, decreased in size from prior, without purulence, erythema, drainage. L heel ulcer no visualized today as patient wanted to get this done at wound clinic Psych: irritable mood today     01/17/2023   10:42 AM  Depression screen PHQ 2/9  Decreased Interest 0  Down, Depressed, Hopeless 0  PHQ - 2 Score 0  Altered sleeping 0  Tired, decreased energy 0  Change in appetite 0  Feeling bad or failure about yourself  0  Trouble concentrating 0  Moving slowly or fidgety/restless 0  Suicidal thoughts 0  PHQ-9 Score 0  Difficult doing work/chores Not difficult at all        No data to display           Assessment & Plan:   Diabetic osteomyelitis (HCC) Patient is compliant to antibiotics, which should be finished on Saturday. Decreased pain, edema, and erythema around L great toe. Patient was reminded of need for follow up at orthopedic clinic. Phone number was provided again in writing on AVS.  Type 2 diabetes mellitus with foot ulcer, with long-term current use of insulin (HCC) Reported he does not check fasting or meal time BG with home glucometer. Discussed cGM and patient would like instruction on how to use at home. He reports has been using 25 units BID of 70/30 since we last saw each other. Denies episodes of sweating, nausea, palpitation, headaches, lightheadedness , confusion, or blurry vision suggest hypoglycemia. He is not aware of polydipsia or polyuria.   He does not demonstrate understanding of diabetes disease process and its implications on wound healing. Will continue on 70/30 insulin for now and have discussed his case with our diabetes educator in hopes of better understanding, cGM, and diabetes control. - Follow up in 2 months with [facility]  - Close follow up with Ms. 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 Patient has been referred and will be following with wound care today in the PM.   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 Patient was aware of this but has not started Lasix 60 mg. Discuss need to monitor weights, adherence to medication changes to avoid volume overload and risk of hospitalization.  254 weight today from 257 lb on 5/30. Mildly hypertensive. Does not appear grossly volume overload.   Patient will pick up Lasix today and start with 60 mg daily. Aware of 6/11 lab only appointment and future cardiology follow up.   Return in about 8 weeks (around 03/14/2023) for Diabetes, HTN, and other medical conditions management.  Patient discussed with  Dr. Rosalia Hammers, MD [facility] Internal Medicine Program - PGY-1 01/17/2023, 1:58 PM

## 2023-01-17 NOTE — Assessment & Plan Note (Signed)
Reported he does not check fasting or meal time BG with home glucometer. Discussed cGM and patient would like instruction on how to use at home. He reports has been using 25 units BID of 70/30 since we last saw each other. Denies episodes of sweating, nausea, palpitation, headaches, lightheadedness , confusion, or blurry vision suggest hypoglycemia. He is not aware of polydipsia or polyuria.   He does not demonstrate understanding of diabetes disease process and its implications on wound healing. Will continue on 70/30 insulin for now and have discussed his case with our diabetes educator in hopes of better understanding, cGM, and diabetes control. - Follow up in 2 months with [facility]  - Close follow up with Ms. Norm Parcel, RD for diabetes education

## 2023-01-18 ENCOUNTER — Encounter: Payer: Self-pay | Admitting: Family

## 2023-01-18 ENCOUNTER — Ambulatory Visit: Payer: 59 | Admitting: Family

## 2023-01-18 DIAGNOSIS — L89623 Pressure ulcer of left heel, stage 3: Secondary | ICD-10-CM | POA: Diagnosis not present

## 2023-01-18 DIAGNOSIS — E1169 Type 2 diabetes mellitus with other specified complication: Secondary | ICD-10-CM | POA: Diagnosis not present

## 2023-01-18 DIAGNOSIS — M869 Osteomyelitis, unspecified: Secondary | ICD-10-CM

## 2023-01-18 NOTE — Progress Notes (Signed)
Office Visit Note   Patient: Dakota Ramirez           Date of Birth: 03-19-1971           MRN: [id number] Visit Date: 01/18/2023              Requested by: Annett Fabian, MD [address] [place],  [place] [address] PCP: Annett Fabian, MD  No chief complaint on file.     HPI: The patient is a 52 year old gentleman who presents today in hospitalization follow-up.  Hospitalized on May 17 for acute on chronic CHF also history of diabetic osteomyelitis.  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  While hospitalized radiographs and MRI performed of the left foot osteomyelitis, chronic in the great toe left foot.  Imaging was reassuring of the left heel.  He did also have ABIs which was normal on the left.  Has been set up with a PRAFO however this is quite difficult to walk and he has not been using it he has been using a postop shoe on the left.  Assessment & Plan: Visit Diagnoses:  1. Diabetic osteomyelitis (HCC)   2. Osteomyelitis of great toe of left foot (HCC)   3. Decubitus ulcer of left heel, stage 3 (HCC)     Plan: Plan to proceed with great toe amputation on the left.  Patient is in agreement with the plan.  Discussed pressure offloading continued wound care of the left heel he will continue on his current antibiotics which are amoxicillin as well as doxycycline.  Discussed return precautions.  He will packed open with silver cell dry gauze dry dressing to the great toe ulcer.  Follow-Up Instructions: No follow-ups on file.   Ortho Exam  Patient is alert, oriented, no adenopathy, well-dressed, normal affect, normal respiratory effort. On examination of the left lower extremity he does have pitting edema with erythema which is mild no cellulitis of the left lower extremity he does have great toe ulceration with which probes to bone.  This is 5 mm in diameter to the distal tip of the left great toe.  Plantar heel ulcer 2 cm in diameter with 1 cm of depth does not probe to bone today  there is flat pink tissue in the wound bed  Imaging: No results found. No images are attached to the encounter.  Labs: Lab Results  Component Value Date   HGBA1C 11.2 (H) 12/26/2022   HGBA1C 12.4 (H) 11/26/2021   ESRSEDRATE 68 (H) 12/28/2022   CRP 5.6 (H) 12/28/2022     Lab Results  Component Value Date   ALBUMIN 1.8 (L) 12/26/2022   ALBUMIN 2.5 (L) 11/29/2021   ALBUMIN 2.8 (L) 11/26/2021    Lab Results  Component Value Date   MG 1.4 (L) 12/29/2022   MG 1.8 12/28/2022   MG 1.2 (L) 12/27/2022   No results found for: "VD25OH"  No results found for: "PREALBUMIN"    Latest Ref Rng & Units 12/26/2022    2:19 PM 12/26/2022   11:32 AM 12/13/2021    3:13 PM  CBC EXTENDED  WBC 4.0 - 10.5 K/uL 5.8  5.8  7.2   RBC 4.22 - 5.81 MIL/uL 4.64  4.99  5.29   Hemoglobin 13.0 - 17.0 g/dL 69.6  29.5  28.4   HCT 39.0 - 52.0 % 41.7  44.2  43.3   Platelets 150 - 400 K/uL 247  238  231      There is no height or  weight on file to calculate BMI.  Orders:  No orders of the defined types were placed in this encounter.  No orders of the defined types were placed in this encounter.    Procedures: No procedures performed  Clinical Data: No additional findings.  ROS:  All other systems negative, except as noted in the HPI. Review of Systems  Objective: Vital Signs: There were no vitals taken for this visit.  Specialty Comments:  No specialty comments available.  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) 11/26/2021   Type 2 diabetes mellitus with foot ulcer, with long-term current use of insulin (HCC) 11/26/2021   Essential hypertension 11/26/2021   Past Medical History:  Diagnosis Date   Diabetes mellitus without complication (HCC)    Heart failure with reduced ejection fraction (HCC) 11/26/2021   Hypertension     Family History  Problem Relation Age of Onset   Diabetes Mother    Hypertension Mother     Past Surgical History:  Procedure Laterality Date   BACK SURGERY     CHOLECYSTECTOMY     RIGHT/LEFT HEART CATH AND CORONARY ANGIOGRAPHY N/A 11/30/2021   Procedure: RIGHT/LEFT HEART CATH AND CORONARY ANGIOGRAPHY;  Surgeon: Dolores Patty, MD;  Location: [facility] INVASIVE [facility];  Service: Cardiovascular;  Laterality: N/A;   TOE AMPUTATION Right    all toes on RT foot   Social History   Occupational History   Not on file  Tobacco Use   Smoking status: Never   Smokeless tobacco: Never  Vaping Use   Vaping Use: Never used  Substance and Sexual Activity   Alcohol use: Never   Drug use: Never   Sexual activity: Not on file

## 2023-01-18 NOTE — Progress Notes (Signed)
Internal Medicine Clinic Attending  Case discussed with Dr. Gomez-Caraballo  At the time of the visit.  We reviewed the resident's history and exam and pertinent patient test results.  I agree with the assessment, diagnosis, and plan of care documented in the resident's note.  

## 2023-01-21 ENCOUNTER — Other Ambulatory Visit (HOSPITAL_COMMUNITY): Payer: Self-pay

## 2023-01-21 DIAGNOSIS — S91309D Unspecified open wound, unspecified foot, subsequent encounter: Secondary | ICD-10-CM | POA: Diagnosis not present

## 2023-01-21 NOTE — Progress Notes (Signed)
Dakota Ramirez, Dakota Ramirez ([id number]) [phone] [id number] Page 1 of 4 Visit Report for 01/17/2023 Abuse Risk Screen Details Patient Name: Date of Service: Dakota Ramirez, Dakota Ramirez 01/17/2023 2:30 PM Medical Record Number: [id number] Patient Account Number: [id number] Date of Birth/Sex: Treating RN: 02-14-1971 (51 y.o. M) Primary Care Cai Anfinson: Ishmael Holter EL Other Clinician: Referring Refujio Haymer: Treating Katelind Pytel/Extender: Susette Racer, Ripley Fraise in Treatment: 0 Abuse Risk Screen Items Answer ABUSE RISK SCREEN: Has anyone close to you tried to hurt or harm you recentlyo No Do you feel uncomfortable with anyone in your familyo No Has anyone forced you do things that you didnt want to doo No Electronic Signature(s) Signed: 01/21/2023 3:42:51 PM By: Thayer Dallas Entered By: Thayer Dallas on 01/17/2023 14:47:22 -------------------------------------------------------------------------------- Activities of Daily Living Details Patient Name: Date of Service: Dakota Ramirez, Dakota Ramirez 01/17/2023 2:30 PM Medical Record Number: [id number] Patient Account Number: [id number] Date of Birth/Sex: Treating RN: 10-06-1970 (51 y.o. M) Primary Care Riese Hellard: Ishmael Holter EL Other Clinician: Referring Raeden Belzer: Treating Solash Tullo/Extender: Susette Racer, Ripley Fraise in Treatment: 0 Activities of Daily Living Items Answer Activities of Daily Living (Please select one for each item) Drive Automobile Completely Able T Medications ake Completely Able Use T elephone Completely Able Care for Appearance Completely Able Use T oilet Completely Able Bath / Shower Completely Able Dress Self Completely Able Feed Self Completely Able Walk Completely Able Get In / Out Bed Completely Able Housework Completely Able Prepare Meals Completely Able Handle Money Completely Able Shop for Self Completely Able Electronic Signature(s) Signed: 01/21/2023 3:42:51 PM By: Thayer Dallas Entered  By: Thayer Dallas on 01/17/2023 14:48:28 -------------------------------------------------------------------------------- Education Screening Details Patient Name: Date of Service: Dakota Ramirez 01/17/2023 2:30 PM Medical Record Number: [id number] Patient Account Number: [id number] Date of Birth/Sex: Treating RN: 03-Nov-1970 (51 y.o. M) Primary Care Kellan Raffield: Ishmael Holter EL Other Clinician: Referring Brayla Pat: Treating Lincoln Ginley/Extender: Susette Racer, Ripley Fraise in Treatment: [address] Dakota Ramirez, Dakota Ramirez Val Eagle ([id number]) [phone].pdf Page 2 of 4 Primary Learner Assessed: Patient Learning Preferences/Education Level/Primary Language Learning Preference: Explanation, Demonstration, Printed Material Highest Education Level: College or Above Preferred Language: Economist Language Barrier: No Translator Needed: No Memory Deficit: No Emotional Barrier: No Cultural/Religious Beliefs Affecting Medical Care: No Physical Barrier Impaired Vision: No Impaired Hearing: No Decreased Hand dexterity: No Knowledge/Comprehension Knowledge Level: High Comprehension Level: High Ability to understand written instructions: High Ability to understand verbal instructions: High Motivation Anxiety Level: Calm Cooperation: Cooperative Education Importance: Acknowledges Need Interest in Health Problems: Asks Questions Perception: Coherent Willingness to Engage in Self-Management High Activities: Readiness to Engage in Self-Management High Activities: Electronic Signature(s) Signed: 01/21/2023 3:42:51 PM By: Thayer Dallas Entered By: Thayer Dallas on 01/17/2023 14:49:10 -------------------------------------------------------------------------------- Fall Risk Assessment Details Patient Name: Date of Service: Dakota Ramirez 01/17/2023 2:30 PM Medical Record Number: [id number] Patient Account Number: [id number] Date of Birth/Sex: Treating  RN: September 19, 1970 (51 y.o. M) Primary Care Aairah Negrette: Ishmael Holter EL Other Clinician: Referring Fayelynn Distel: Treating Dakota Ramirez/Extender: Susette Racer, Ripley Fraise in Treatment: 0 Fall Risk Assessment Items Have you had 2 or more falls in the last 12 monthso 0 Yes Have you had any fall that resulted in injury in the last 12 monthso 0 No FALLS RISK SCREEN History of falling - immediate or within 3 months 25 Yes Secondary diagnosis (Do you have 2 or more medical diagnoseso) 0 No Ambulatory aid None/bed rest/wheelchair/nurse 0 Yes Crutches/cane/walker 0 No Furniture 0 No Intravenous therapy Access/Saline/Heparin Lock 0 No  Gait/Transferring Normal/ bed rest/ wheelchair 0 Yes Weak (short steps with or without shuffle, stooped but able to lift head while walking, may seek 0 No support from furniture) Impaired (short steps with shuffle, may have difficulty arising from chair, head down, impaired 0 No balance) Mental Status Oriented to own ability 0 Yes Overestimates or forgets limitations 0 No Risk Level: Medium Risk Score: [address] ([id number]) [id number] Page 3 of 4 Electronic Signature(s) -------------------------------------------------------------------------------- Foot Assessment Details Patient Name: Date of Service: Dakota Ramirez, Dakota Ramirez 01/17/2023 2:30 PM Medical Record Number: [id number] Patient Account Number: [id number] Date of Birth/Sex: Treating RN: 1971-06-24 (51 y.o. M) Primary Care Jamillah Camilo: Ishmael Holter EL Other Clinician: Referring Anselm Aumiller: Treating Harvey Matlack/Extender: Susette Racer, Ripley Fraise in Treatment: 0 Foot Assessment Items Site Locations + = Sensation present, - = Sensation absent, C = Callus, U = Ulcer R = Redness, W = Warmth, M = Maceration, PU = Pre-ulcerative lesion F = Fissure, S = Swelling, D = Dryness Assessment Right: Left: Other Deformity: No No Prior Foot Ulcer: No No Prior  Amputation: Yes No Charcot Joint: No No Ambulatory Status: Ambulatory Without Help Gait: Steady Notes right toes amputated Electronic Signature(s) Signed: 01/21/2023 3:42:51 PM By: Thayer Dallas Entered By: Thayer Dallas on 01/17/2023 15:22:41 -------------------------------------------------------------------------------- Nutrition Risk Screening Details Patient Name: Date of Service: Dakota Ramirez, Dakota Ramirez 01/17/2023 2:30 PM Medical Record Number: [id number] Patient Account Number: [id number] Date of Birth/Sex: Treating RN: 1971/04/30 (51 y.o. M) Primary Care Gailene Youkhana: Ishmael Holter EL Other Clinician: Referring Keaisha Sublette: Treating Renna Kilmer/Extender: Susette Racer, Ripley Fraise in Treatment: 0 Height (in): 71 Weight (lbs): 254 Body Mass Index (BMI): 35.4 KIERON, PARTIN ([id number]) [id number] Page 4 of 4 Nutrition Risk Screening Items Score Screening NUTRITION RISK SCREEN: I have an illness or condition that made me change the kind and/or amount of food I eat 0 No I eat fewer than two meals per day 0 No I eat few fruits and vegetables, or milk products 0 No I have three or more drinks of beer, liquor or wine almost every day 0 No I have tooth or mouth problems that make it hard for me to eat 0 No I don't always have enough money to buy the food I need 0 No I eat alone most of the time 1 Yes I take three or more different prescribed or over-the-counter drugs a day 1 Yes Without wanting to, I have lost or gained 10 pounds in the last six months 2 Yes I am not always physically able to shop, cook and/or feed myself 0 No Nutrition Protocols Good Risk Protocol Moderate Risk Protocol 0 Provide education on nutrition High Risk Proctocol Risk Level: Moderate Risk Score: 4 Electronic Signature(s) Signed: 01/21/2023 3:42:51 PM By: Thayer Dallas Entered By: Thayer Dallas on 01/17/2023 14:51:55

## 2023-01-21 NOTE — Addendum Note (Signed)
Addended by: Baird Cancer on: 01/21/2023 02:32 PM   Modules accepted: Orders

## 2023-01-21 NOTE — Telephone Encounter (Addendum)
Copay for both is 0$. Prescriptions requested.

## 2023-01-21 NOTE — Telephone Encounter (Addendum)
Spoke with patient, he is using his brother's android phone so for CGM to work will need a reader. He states he cannot afford 75$ a month. Will ask pharmacy tech to run a test Rx for Dexcom G7 sensors and reader. His appointment was moved to next Monday.

## 2023-01-22 ENCOUNTER — Inpatient Hospital Stay (HOSPITAL_COMMUNITY): Admission: RE | Admit: 2023-01-22 | Payer: 59 | Source: Ambulatory Visit

## 2023-01-22 ENCOUNTER — Encounter: Payer: 59 | Admitting: Dietician

## 2023-01-24 ENCOUNTER — Other Ambulatory Visit: Payer: Self-pay

## 2023-01-24 ENCOUNTER — Encounter (HOSPITAL_COMMUNITY): Payer: Self-pay | Admitting: Orthopedic Surgery

## 2023-01-24 NOTE — Anesthesia Preprocedure Evaluation (Addendum)
Anesthesia Evaluation  Patient identified by MRN, date of birth, ID band Patient awake    Reviewed: Allergy & Precautions, H&P , NPO status , Patient's Chart, lab work & pertinent test results, reviewed documented beta blocker date and time   Airway Mallampati: II  TM Distance: >3 FB Neck ROM: Full    Dental no notable dental hx. (+) Poor Dentition, Dental Advisory Given   Pulmonary neg pulmonary ROS   Pulmonary exam normal breath sounds clear to auscultation       Cardiovascular hypertension, Pt. on medications and Pt. on home beta blockers + Peripheral Vascular Disease and +CHF   Rhythm:Regular Rate:Normal     Neuro/Psych negative neurological ROS  negative psych ROS   GI/Hepatic negative GI ROS, Neg liver ROS,,,  Endo/Other  diabetes, Insulin Dependent  Morbid obesity  Renal/GU negative Renal ROS  negative genitourinary   Musculoskeletal   Abdominal   Peds  Hematology negative hematology ROS (+)   Anesthesia Other Findings   Reproductive/Obstetrics negative OB ROS                             Anesthesia Physical Anesthesia Plan  ASA: 4  Anesthesia Plan: MAC and Regional   Post-op Pain Management: Tylenol PO (pre-op)*   Induction: Intravenous  PONV Risk Score and Plan: 1 and Propofol infusion, Ondansetron and Midazolam  Airway Management Planned: Natural Airway and Simple Face Mask  Additional Equipment:   Intra-op Plan:   Post-operative Plan:   Informed Consent: I have reviewed the patients History and Physical, chart, labs and discussed the procedure including the risks, benefits and alternatives for the proposed anesthesia with the patient or authorized representative who has indicated his/her understanding and acceptance.     Dental advisory given  Plan Discussed with: CRNA  Anesthesia Plan Comments: (PAT note by Antionette Poles, PA-C:  Follows with cardiology.   Treated nonischemic cardiomyopathy and severe biventricular heart failure. dmitted 4/23 with new-onset acute HF.  Given IV lasix. EchoEF 20-25%,regional WMA,moderately dilated LV, RV moderately reduced, RVSP 40 mmHg, mild to moderate MR, dilated IVC with estimated RAP 15 mmHg. Cards consulted and GDMT started. 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 cMRI 4/24 showed severe biventricular failure, LVEF 17%. RVEF 22% LGE imaging very poor quality, which limits interpretation. 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  He was recently mated 5/15 through 12/29/2022 for marked volume overload (weight up 40 pounds).  He was diuresed with IV Lasix. Echo showed EF <20%, LV with GHK, RV mildly reduced, LA/RA sev dilated, mild-mod MR, mild TR, trivial pericardial effusion. GDMT titrated.   He had outpatient follow-up in the advanced heart failure clinic with Prince Rome, NP on 01/10/2023.  At that time his volume status was noted to be okay, no shortness of breath walking on flat ground.  He is continued on GDMT with Coreg, digoxin, Lasix, Entresto, spironolactone.  At this time he is not a candidate for ICD with ongoing lower extremity wounds.  Uncontrolled IDDM 2, A1c 11.2 on 12/26/2022.  Reviewed history with anesthesiologist Dr. Desmond Lope.  No contraindication to proceeding.  Will need day of surgery labs and evaluation by assigned anesthesiologist.  EKG 01/10/2023: Sinus tachycardia.  Rate 107.  Nonspecific T wave abnormality.  Cardiac Studies: - Echo (5/24): EF < 20%, LV with GHK, RV mildly reduced, LA/RA sev dilated, mild-mod MR, mild TR,  trivial pericardial effusion.   - Bedside echo (9/23): EF 20-25%  - Echo (4/23): EF 20-25%,regional WMA,moderately dilated LV, RV moderately reduced, RVSP 40 mmHg, mild to moderate MR, dilated IVC with estimated RAP 15 mmHg.  - R/LHC (4/23):   The left  ventricular ejection fraction is less than 25% by visual estimate.  Ao = 97/67 (85) LV = 105/25 RA = 8 RV = 37/14 PA = 35/22 (25) PCW = 20 Fick cardiac output/index = 5.9/2.4 PVR = 0.8 WU SVR = 1040 Ao sat = 97% PA sat = 66%, 67%  1. Normal coronary arteries 2. 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 Mildly elevated filling pressures with normal cardiac output  cMRI (4/24): poor quality, LVEF 17%, RVEF 22%,  appears to have RV insertion site LGE (which is a nonspecific scar finding often seen in setting of elevated pulmonary pressures) and basal septal midwall LGE (which is a scar pattern seen in NICM and associated with worse prognosis)  )        Anesthesia Quick Evaluation

## 2023-01-24 NOTE — Progress Notes (Addendum)
Mr. Dakota Ramirez denies chest pain or shortness of breath. Patient denies having any s/s of Covid in his household, also denies any known exposure to Covid.  .Mr. Dakota Ramirez denies  any s/s of upper or lower respiratory in the past 8 weeks.  Mr. Dakota Ramirez 's PCP is Dr Annett Fabian,  cardiologist is Dr. Gala Romney.  I asked patient if Dr. Gala Romney is aware that he is having surgery tomorrow, Dakota Ramirez does not know. I asked patient why he missed lab appoint on 01/22/23, patient said , "I have so many appointments that I can't keep them straight." Mr. Dakota Ramirez has type II diabetes, patient doe snot know where his glucose machine is.  I instructed patient to take 12 units Of 70/30 Insulin at dinner and 12 units of 70/30 in am. I asked Dakota Poles, PA-C to review chart.

## 2023-01-24 NOTE — Progress Notes (Signed)
Anesthesia Chart Review: Same day workup  Follows with cardiology.  Treated nonischemic cardiomyopathy and severe biventricular heart failure. dmitted 4/23 with new-onset acute HF.  Given IV lasix. Echo EF 20-25%, regional WMA, moderately dilated LV, RV moderately reduced, RVSP 40 mmHg, mild to moderate MR, dilated IVC with estimated RAP 15 mmHg. Cards consulted and GDMT started. 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 cMRI 4/24 showed severe biventricular failure, LVEF 17%. RVEF 22% LGE imaging very poor quality, which limits interpretation. 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  He was recently mated 5/15 through 12/29/2022 for marked volume overload (weight up 40 pounds).  He was diuresed with IV Lasix. Echo showed EF <20%, LV with GHK, RV mildly reduced, LA/RA sev dilated, mild-mod MR, mild TR, trivial pericardial effusion. GDMT titrated.   He had outpatient follow-up in the advanced heart failure clinic with Prince Rome, NP on 01/10/2023.  At that time his volume status was noted to be okay, no shortness of breath walking on flat ground.  He is continued on GDMT with Coreg, digoxin, Lasix, Entresto, spironolactone.  At this time he is not a candidate for ICD with ongoing lower extremity wounds.  Uncontrolled IDDM 2, A1c 11.2 on 12/26/2022.  Reviewed history with anesthesiologist Dr. Desmond Lope.  No contraindication to proceeding.  Will need day of surgery labs and evaluation by assigned anesthesiologist.  EKG 01/10/2023: Sinus tachycardia.  Rate 107.  Nonspecific T wave abnormality.  Cardiac Studies: - Echo (5/24): EF < 20%, LV with GHK, RV mildly reduced, LA/RA sev dilated, mild-mod MR, mild TR, trivial pericardial effusion.    - Bedside echo (9/23): EF 20-25%   - Echo (4/23): EF 20-25%, regional WMA, moderately dilated LV, RV moderately reduced, RVSP 40 mmHg, mild to moderate MR, dilated IVC  with estimated RAP 15 mmHg.   - R/LHC (4/23):    The left ventricular ejection fraction is less than 25% by visual estimate.   Ao = 97/67 (85) LV = 105/25 RA = 8 RV = 37/14 PA = 35/22 (25) PCW = 20 Fick cardiac output/index = 5.9/2.4 PVR = 0.8 WU SVR = 1040 Ao sat = 97% PA sat = 66%, 67%   1. Normal coronary arteries 2. 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 Mildly elevated filling pressures with normal cardiac output   cMRI (4/24): poor quality, LVEF 17%, RVEF 22%,  appears to have RV insertion site LGE (which is a nonspecific scar finding often seen in setting of elevated pulmonary pressures) and basal septal midwall LGE (which is a scar pattern seen in NICM and associated with worse prognosis)      Dakota Ramirez [facility] Short Stay Center/Anesthesiology Phone [phone] 01/24/2023 3:43 PM

## 2023-01-25 ENCOUNTER — Ambulatory Visit (HOSPITAL_COMMUNITY)
Admission: RE | Admit: 2023-01-25 | Discharge: 2023-01-25 | Disposition: A | Discharge status: Home / Self Care | Payer: 59 | Attending: Orthopedic Surgery | Admitting: Orthopedic Surgery

## 2023-01-25 ENCOUNTER — Encounter (HOSPITAL_COMMUNITY): Payer: Self-pay | Admitting: Orthopedic Surgery

## 2023-01-25 ENCOUNTER — Ambulatory Visit (HOSPITAL_BASED_OUTPATIENT_CLINIC_OR_DEPARTMENT_OTHER): Payer: 59 | Admitting: Physician Assistant

## 2023-01-25 ENCOUNTER — Ambulatory Visit (HOSPITAL_COMMUNITY): Payer: 59 | Admitting: Physician Assistant

## 2023-01-25 ENCOUNTER — Other Ambulatory Visit: Payer: Self-pay

## 2023-01-25 ENCOUNTER — Encounter (HOSPITAL_COMMUNITY)
Admission: RE | Disposition: A | Discharge status: Home / Self Care | Payer: Self-pay | Source: Home / Self Care | Attending: Orthopedic Surgery

## 2023-01-25 DIAGNOSIS — M86672 Other chronic osteomyelitis, left ankle and foot: Secondary | ICD-10-CM | POA: Insufficient documentation

## 2023-01-25 DIAGNOSIS — E1169 Type 2 diabetes mellitus with other specified complication: Secondary | ICD-10-CM | POA: Insufficient documentation

## 2023-01-25 DIAGNOSIS — E1151 Type 2 diabetes mellitus with diabetic peripheral angiopathy without gangrene: Secondary | ICD-10-CM

## 2023-01-25 DIAGNOSIS — Z8249 Family history of ischemic heart disease and other diseases of the circulatory system: Secondary | ICD-10-CM | POA: Insufficient documentation

## 2023-01-25 DIAGNOSIS — L89623 Pressure ulcer of left heel, stage 3: Secondary | ICD-10-CM | POA: Insufficient documentation

## 2023-01-25 DIAGNOSIS — I110 Hypertensive heart disease with heart failure: Secondary | ICD-10-CM

## 2023-01-25 DIAGNOSIS — I5020 Unspecified systolic (congestive) heart failure: Secondary | ICD-10-CM | POA: Insufficient documentation

## 2023-01-25 DIAGNOSIS — I509 Heart failure, unspecified: Secondary | ICD-10-CM | POA: Diagnosis not present

## 2023-01-25 DIAGNOSIS — Z89421 Acquired absence of other right toe(s): Secondary | ICD-10-CM | POA: Diagnosis not present

## 2023-01-25 DIAGNOSIS — Z833 Family history of diabetes mellitus: Secondary | ICD-10-CM | POA: Diagnosis not present

## 2023-01-25 DIAGNOSIS — M869 Osteomyelitis, unspecified: Secondary | ICD-10-CM | POA: Diagnosis not present

## 2023-01-25 DIAGNOSIS — L97421 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 to breakdown of skin: Secondary | ICD-10-CM

## 2023-01-25 DIAGNOSIS — E11621 Type 2 diabetes mellitus with foot ulcer: Secondary | ICD-10-CM | POA: Insufficient documentation

## 2023-01-25 DIAGNOSIS — Z794 Long term (current) use of insulin: Secondary | ICD-10-CM | POA: Diagnosis not present

## 2023-01-25 DIAGNOSIS — Z6835 Body mass index (BMI) 35.0-35.9, adult: Secondary | ICD-10-CM | POA: Diagnosis not present

## 2023-01-25 HISTORY — DX: Heart failure, unspecified: I50.9

## 2023-01-25 HISTORY — PX: AMPUTATION: SHX166

## 2023-01-25 LAB — CBC
HCT: 46.7 % (ref 39.0–52.0)
Hemoglobin: 15.3 g/dL (ref 13.0–17.0)
MCH: 27.8 pg (ref 26.0–34.0)
MCHC: 32.8 g/dL (ref 30.0–36.0)
MCV: 84.9 fL (ref 80.0–100.0)
Platelets: 212 10*3/uL (ref 150–400)
RBC: 5.5 MIL/uL (ref 4.22–5.81)
RDW: 15.3 % (ref 11.5–15.5)
WBC: 8 10*3/uL (ref 4.0–10.5)
nRBC: 0 % (ref 0.0–0.2)

## 2023-01-25 LAB — GLUCOSE, CAPILLARY
Glucose-Capillary: 227 mg/dL — ABNORMAL HIGH (ref 70–99)
Glucose-Capillary: 233 mg/dL — ABNORMAL HIGH (ref 70–99)

## 2023-01-25 SURGERY — AMPUTATION DIGIT
Anesthesia: Monitor Anesthesia Care | Site: Foot | Laterality: Left

## 2023-01-25 MED ORDER — FENTANYL CITRATE (PF) 100 MCG/2ML IJ SOLN
INTRAMUSCULAR | Status: AC
  Administered 2023-01-25: 50 ug
  Filled 2023-01-25: qty 2

## 2023-01-25 MED ORDER — BUPIVACAINE-EPINEPHRINE (PF) 0.5% -1:200000 IJ SOLN
INTRAMUSCULAR | Status: DC | PRN
  Administered 2023-01-25: 30 mL via PERINEURAL

## 2023-01-25 MED ORDER — CHLORHEXIDINE GLUCONATE 0.12 % MT SOLN
OROMUCOSAL | Status: AC
  Administered 2023-01-25: 15 mL via OROMUCOSAL
  Filled 2023-01-25: qty 15

## 2023-01-25 MED ORDER — CEFAZOLIN SODIUM-DEXTROSE 2-4 GM/100ML-% IV SOLN
INTRAVENOUS | Status: AC
  Filled 2023-01-25: qty 100

## 2023-01-25 MED ORDER — CHLORHEXIDINE GLUCONATE 0.12 % MT SOLN: 15.0000 mL | Freq: Once | OROMUCOSAL | Status: AC

## 2023-01-25 MED ORDER — INSULIN ASPART 100 UNIT/ML IJ SOLN
INTRAMUSCULAR | Status: AC
  Filled 2023-01-25: qty 1

## 2023-01-25 MED ORDER — OXYCODONE-ACETAMINOPHEN 5-325 MG PO TABS: 1.0000 | ORAL_TABLET | ORAL | 0 refills | Status: DC | PRN

## 2023-01-25 MED ORDER — ORAL CARE MOUTH RINSE: 15.0000 mL | Freq: Once | OROMUCOSAL | Status: AC

## 2023-01-25 MED ORDER — 0.9 % SODIUM CHLORIDE (POUR BTL) OPTIME
TOPICAL | Status: DC | PRN
  Administered 2023-01-25: 1000 mL

## 2023-01-25 MED ORDER — VANCOMYCIN HCL IN DEXTROSE 1-5 GM/200ML-% IV SOLN
INTRAVENOUS | Status: AC
  Filled 2023-01-25: qty 200

## 2023-01-25 MED ORDER — INSULIN ASPART 100 UNIT/ML IJ SOLN
0.0000 [IU] | INTRAMUSCULAR | Status: DC | PRN
  Administered 2023-01-25: 6 [IU] via SUBCUTANEOUS

## 2023-01-25 MED ORDER — ACETAMINOPHEN 500 MG PO TABS
1000.0000 mg | ORAL_TABLET | Freq: Once | ORAL | Status: AC
  Administered 2023-01-25: 1000 mg via ORAL
  Filled 2023-01-25: qty 2

## 2023-01-25 MED ORDER — CEFAZOLIN SODIUM-DEXTROSE 2-4 GM/100ML-% IV SOLN
2.0000 g | INTRAVENOUS | Status: AC
  Administered 2023-01-25: 2 g via INTRAVENOUS

## 2023-01-25 MED ORDER — MIDAZOLAM HCL 2 MG/2ML IJ SOLN
2.0000 mg | Freq: Once | INTRAMUSCULAR | Status: AC
  Filled 2023-01-25: qty 2

## 2023-01-25 MED ORDER — HYDROMORPHONE HCL 1 MG/ML IJ SOLN: 0.2500 mg | INTRAMUSCULAR | Status: DC | PRN

## 2023-01-25 MED ORDER — MIDAZOLAM HCL 2 MG/2ML IJ SOLN
INTRAMUSCULAR | Status: AC
  Administered 2023-01-25: 2 mg via INTRAVENOUS
  Filled 2023-01-25: qty 2

## 2023-01-25 MED ORDER — LACTATED RINGERS IV SOLN: INTRAVENOUS | Status: DC

## 2023-01-25 MED ORDER — VANCOMYCIN HCL 1500 MG/300ML IV SOLN
1500.0000 mg | INTRAVENOUS | Status: AC
  Administered 2023-01-25: 1500 mg via INTRAVENOUS
  Filled 2023-01-25 (×2): qty 300

## 2023-01-25 MED ORDER — FENTANYL CITRATE PF 50 MCG/ML IJ SOSY
50.0000 ug | PREFILLED_SYRINGE | Freq: Once | INTRAMUSCULAR | Status: DC
  Filled 2023-01-25: qty 1

## 2023-01-25 SURGICAL SUPPLY — 33 items
BAG COUNTER SPONGE SURGICOUNT (BAG) ×1 IMPLANT
BAG SPNG CNTER NS LX DISP (BAG) ×1
BLADE SURG 21 STRL SS (BLADE) ×1 IMPLANT
BNDG CMPR 9X4 STRL LF SNTH (GAUZE/BANDAGES/DRESSINGS)
BNDG COHESIVE 4X5 TAN STRL (GAUZE/BANDAGES/DRESSINGS) ×1 IMPLANT
BNDG COHESIVE 6X5 TAN NS LF (GAUZE/BANDAGES/DRESSINGS) IMPLANT
BNDG ESMARK 4X9 LF (GAUZE/BANDAGES/DRESSINGS) IMPLANT
BNDG GAUZE DERMACEA FLUFF 4 (GAUZE/BANDAGES/DRESSINGS) ×1 IMPLANT
BNDG GZE DERMACEA 4 6PLY (GAUZE/BANDAGES/DRESSINGS) ×1
COVER SURGICAL LIGHT HANDLE (MISCELLANEOUS) ×2 IMPLANT
DRAPE U-SHAPE 47X51 STRL (DRAPES) ×1 IMPLANT
DRSG ADAPTIC 3X8 NADH LF (GAUZE/BANDAGES/DRESSINGS) IMPLANT
DURAPREP 26ML APPLICATOR (WOUND CARE) ×1 IMPLANT
ELECT REM PT RETURN 9FT ADLT (ELECTROSURGICAL) ×1
ELECTRODE REM PT RTRN 9FT ADLT (ELECTROSURGICAL) ×1 IMPLANT
GAUZE PAD ABD 8X10 STRL (GAUZE/BANDAGES/DRESSINGS) ×1 IMPLANT
GAUZE SPONGE 4X4 12PLY STRL (GAUZE/BANDAGES/DRESSINGS) IMPLANT
GLOVE BIOGEL PI IND STRL 9 (GLOVE) ×1 IMPLANT
GLOVE SURG ORTHO 9.0 STRL STRW (GLOVE) ×1 IMPLANT
GOWN STRL REUS W/ TWL XL LVL3 (GOWN DISPOSABLE) ×2 IMPLANT
GOWN STRL REUS W/TWL XL LVL3 (GOWN DISPOSABLE) ×2
GRAFT SKIN WND MICRO 38 (Tissue) IMPLANT
KIT BASIN OR (CUSTOM PROCEDURE TRAY) ×1 IMPLANT
KIT TURNOVER KIT B (KITS) ×1 IMPLANT
MANIFOLD NEPTUNE II (INSTRUMENTS) ×1 IMPLANT
NDL 22X1.5 STRL (OR ONLY) (MISCELLANEOUS) IMPLANT
NEEDLE 22X1.5 STRL (OR ONLY) (MISCELLANEOUS) IMPLANT
NS IRRIG 1000ML POUR BTL (IV SOLUTION) ×1 IMPLANT
PACK ORTHO EXTREMITY (CUSTOM PROCEDURE TRAY) ×1 IMPLANT
PAD ARMBOARD 7.5X6 YLW CONV (MISCELLANEOUS) ×2 IMPLANT
SUT ETHILON 2 0 PSLX (SUTURE) ×1 IMPLANT
SYR CONTROL 10ML LL (SYRINGE) IMPLANT
TOWEL GREEN STERILE (TOWEL DISPOSABLE) ×1 IMPLANT

## 2023-01-25 NOTE — Anesthesia Postprocedure Evaluation (Signed)
Anesthesia Post Note  Patient: Dakota Ramirez  Procedure(s) Performed: LEFT GREAT TOE AMPUTATION AND DEBRIDEMENT OF HEEL (Left: Foot)     Patient location during evaluation: PACU Anesthesia Type: Regional and MAC Level of consciousness: awake and alert Pain management: pain level controlled Vital Signs Assessment: post-procedure vital signs reviewed and stable Respiratory status: spontaneous breathing, nonlabored ventilation and respiratory function stable Cardiovascular status: stable and blood pressure returned to baseline Postop Assessment: no apparent nausea or vomiting Anesthetic complications: no  No notable events documented.  Last Vitals:  Vitals:   01/25/23 1100 01/25/23 1115  BP: 117/85 119/83  Pulse: (!) 101 (!) 101  Resp: 15 17  Temp: 36.5 C 36.5 C  SpO2: 97% 93%    Last Pain:  Vitals:   01/25/23 1115  TempSrc:   PainSc: 0-No pain                 Breanda Greenlaw,W. EDMOND

## 2023-01-25 NOTE — H&P (Signed)
Dakota Ramirez is an 52 y.o. male.   Chief Complaint: Ulceration osteomyelitis left foot great toe. HPI: The patient is a 52 year old gentleman who presents today in hospitalization follow-up. Hospitalized on May 17 for acute on chronic CHF also history of diabetic osteomyelitis. 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 While hospitalized radiographs and MRI performed of the left foot osteomyelitis, chronic in the great toe left foot. Imaging was reassuring of the left heel. He did also have ABIs which was normal on the left.   Past Medical History:  Diagnosis Date   CHF (congestive heart failure) (HCC)    Diabetes mellitus without complication (HCC)    Type II   Heart failure with reduced ejection fraction (HCC) 11/26/2021   Hypertension     Past Surgical History:  Procedure Laterality Date   BACK SURGERY     CHOLECYSTECTOMY     RIGHT/LEFT HEART CATH AND CORONARY ANGIOGRAPHY N/A 11/30/2021   Procedure: RIGHT/LEFT HEART CATH AND CORONARY ANGIOGRAPHY;  Surgeon: Dolores Patty, MD;  Location: [facility] INVASIVE CV LAB;  Service: Cardiovascular;  Laterality: N/A;   TOE AMPUTATION Right    all toes on RT foot    Family History  Problem Relation Age of Onset   Diabetes Mother    Hypertension Mother    Social History:  reports that he has never smoked. He has never used smokeless tobacco. He reports that he does not currently use alcohol. He reports that he does not use drugs.  Allergies: No Known Allergies  No medications prior to admission.    No results found for this or any previous visit (from the past 48 hour(s)). No results found.  Review of Systems  All other systems reviewed and are negative.   There were no vitals taken for this visit. Physical Exam  Patient is alert, oriented, no adenopathy, well-dressed, normal affect, normal respiratory effort. On examination of the left lower extremity he does have pitting edema with erythema which is mild no cellulitis of the left lower  extremity he does have great toe ulceration with which probes to bone.  This is 5 mm in diameter to the distal tip of the left great toe.  Plantar heel ulcer 2 cm in diameter with 1 cm of depth does not probe to bone today there is flat pink tissue in the wound bed Assessment/Plan 1. Diabetic osteomyelitis (HCC)   2. Osteomyelitis of great toe of left foot (HCC)   3. Decubitus ulcer of left heel, stage 3 (HCC)       Plan: Plan to proceed with great toe amputation on the left.  Patient is in agreement with the plan.  Discussed pressure offloading continued wound care of the left heel he will continue on his current antibiotics which are amoxicillin as well as doxycycline.  Discussed return precautions.  Nadara Mustard, MD 01/25/2023, 6:46 AM

## 2023-01-25 NOTE — Interval H&P Note (Signed)
History and Physical Interval Note:  01/25/2023 9:21 AM  Dakota Ramirez  has presented today for surgery, with the diagnosis of Left Great Toe Osteomyelitis.  The various methods of treatment have been discussed with the patient and family. After consideration of risks, benefits and other options for treatment, the patient has consented to  Procedure(s): LEFT GREAT TOE AMPUTATION (Left) as a surgical intervention.  The patient's history has been reviewed, patient examined, no change in status, stable for surgery.  I have reviewed the patient's chart and labs.  Questions were answered to the patient's satisfaction.     Nadara Mustard

## 2023-01-25 NOTE — Op Note (Addendum)
01/25/2023  11:00 AM  PATIENT:  Dakota Ramirez    PRE-OPERATIVE DIAGNOSIS:  Left Great Toe Osteomyelitis  POST-OPERATIVE DIAGNOSIS:  Same  PROCEDURE:  LEFT GREAT TOE AMPUTATION AND DEBRIDEMENT OF HEEL Application Kerecis micro graft 38 cm.  SURGEON:  Nadara Mustard, MD  PHYSICIAN ASSISTANT:None ANESTHESIA:   General  PREOPERATIVE INDICATIONS:  [name] is a  52 y.o. male with a diagnosis of Left Great Toe Osteomyelitis who failed conservative measures and elected for surgical management.    The risks benefits and alternatives were discussed with the patient preoperatively including but not limited to the risks of infection, bleeding, nerve injury, cardiopulmonary complications, the need for revision surgery, among others, and the patient was willing to proceed.  OPERATIVE IMPLANTS:   Implant Name Type Inv. Item Serial No. Manufacturer Lot No. LRB No. Used Action  GRAFT SKIN WND MICRO 38 - [id number] Tissue GRAFT SKIN WND MICRO 38  [employer] [id number] Left 1 Implanted    @ENCIMAGES @  OPERATIVE FINDINGS: Tissue margins were clear no signs of residual infection.  OPERATIVE PROCEDURE: Patient was brought the operating room underwent a regional MAC anesthetic.  After adequate levels anesthesia were obtained patient's left lower extremity was prepped using DuraPrep draped into a sterile field a timeout was called.  A fishmouth incision was made just distal to the MTP joint of the left great toe.  The toe was amputated through the MTP joint.  There is no sign of residual infection.  There was good petechial bleeding, electrocautery was used for hemostasis.  The wound was irrigated with normal saline and incision closed using 2-0 nylon.  There was a decubitus heel ulcer as well this was debrided back to healthy viable tissue and Kerecis micro graft was applied to both the toe and the heel ulcer.  Dry dressings were applied with Adaptic 4 x 4's ABD Curlex and Coban patient was taken the  PACU in stable condition.   DISCHARGE PLANNING:  Antibiotic duration: Preoperative antibiotics  Weightbearing: Touchdown weightbearing on the left with crutches and postoperative shoe  Pain medication: Prescription for Percocet  Dressing care/ Wound VAC: Dry dressing  Ambulatory devices: Crutches  Discharge to: Home.  Follow-up: In the office 1 week post operative.

## 2023-01-25 NOTE — Transfer of Care (Signed)
Immediate Anesthesia Transfer of Care Note  Patient: Dakota Ramirez  Procedure(s) Performed: LEFT GREAT TOE AMPUTATION AND DEBRIDEMENT OF HEEL (Left: Foot)  Patient Location: PACU  Anesthesia Type:MAC combined with regional for post-op pain  Level of Consciousness: awake, alert , and oriented  Airway & Oxygen Therapy: Patient Spontanous Breathing  Post-op Assessment: Report given to RN and Post -op Vital signs reviewed and stable  Post vital signs: Reviewed and stable  Last Vitals:  Vitals Value Taken Time  BP 117/85 01/25/23 1101  Temp 36.5 C 01/25/23 1100  Pulse 101 01/25/23 1106  Resp 16 01/25/23 1106  SpO2 95 % 01/25/23 1106  Vitals shown include unvalidated device data.  Last Pain:  Vitals:   01/25/23 0832  TempSrc:   PainSc: 0-No pain         Complications: No notable events documented.

## 2023-01-25 NOTE — Anesthesia Procedure Notes (Signed)
Anesthesia Regional Block: Popliteal block   Pre-Anesthetic Checklist: , timeout performed,  Correct Patient, Correct Site, Correct Laterality,  Correct Procedure, Correct Position, site marked,  Risks and benefits discussed,  Pre-op evaluation,  At surgeon's request and post-op pain management  Laterality: Left  Prep: Maximum Sterile Barrier Precautions used, chloraprep       Needles:  Injection technique: Single-shot  Needle Type: Echogenic Stimulator Needle     Needle Length: 9cm  Needle Gauge: 21     Additional Needles:   Procedures:, nerve stimulator,,, ultrasound used (permanent image in chart),,     Nerve Stimulator or Paresthesia:  Response: Peroneal Response: Tibial  Additional Responses:   Narrative:  Start time: 01/25/2023 9:42 AM End time: 01/25/2023 9:52 AM Injection made incrementally with aspirations every 5 mL.  Performed by: Personally  Anesthesiologist: Gaynelle Adu, MD

## 2023-01-25 NOTE — Progress Notes (Signed)
Orthopedic Tech Progress Note Patient Details:  [name] [birth year] [id number]  Ortho Devices Type of Ortho Device: Postop shoe/boot, Crutches Ortho Device/Splint Location: LLE Ortho Device/Splint Interventions: Ordered, Application   Post Interventions Patient Tolerated: Well Instructions Provided: Adjustment of device  Terika Pillard A Lanecia Sliva 01/25/2023, 11:24 AM

## 2023-01-26 ENCOUNTER — Encounter (HOSPITAL_COMMUNITY): Payer: Self-pay | Admitting: Orthopedic Surgery

## 2023-01-26 NOTE — Progress Notes (Addendum)
Dakota Ramirez, Dakota Ramirez ([id number]) [id number] Page 1 of 12 Visit Report for 01/17/2023 Allergy List Details Patient Name: Date of Service: Dakota Ramirez, Dakota Ramirez 01/17/2023 2:30 PM Medical Record Number: [id number] Patient Account Number: [id number] Date of Birth/Sex: Treating RN: 06/02/1971 (51 y.Ramirez. Dakota Ramirez Primary Care Dakota Ramirez: Dakota Ramirez EL Other Clinician: Referring Dakota Ramirez: Treating Dakota Ramirez/Extender: Dakota Ramirez, Dakota Ramirez in Treatment: 0 Allergies Active Allergies No Known Drug Allergies Allergy Notes Electronic Signature(s) Signed: 01/21/2023 3:42:51 PM By: Dakota Ramirez Entered By: Dakota Ramirez on 01/17/2023 14:35:40 -------------------------------------------------------------------------------- Arrival Information Details Patient Name: Date of Service: Dakota Ramirez 01/17/2023 2:30 PM Medical Record Number: [id number] Patient Account Number: [id number] Date of Birth/Sex: Treating RN: 22-Jul-1971 (51 y.Ramirez. M) Primary Care Dakota Ramirez: Dakota Ramirez EL Other Clinician: Referring Dakota Ramirez: Treating Dakota Ramirez/Extender: Dakota Ramirez, Dakota Ramirez in Treatment: 0 Visit Information Patient Arrived: Ambulatory Arrival Time: 14:32 Accompanied By: self Transfer Assistance: None Patient Identification Verified: Yes Secondary Verification Process Completed: Yes Patient Requires Transmission-Based Precautions: No Patient Has Alerts: No Electronic Signature(s) Signed: 01/21/2023 3:42:51 PM By: Dakota Ramirez Entered By: Dakota Ramirez on 01/17/2023 14:33:07 -------------------------------------------------------------------------------- Clinic Level of Care Assessment Details Patient Name: Date of Service: Dakota Ramirez, Dakota Ramirez 01/17/2023 2:30 PM Medical Record Number: [id number] Patient Account Number: [id number] Dakota Ramirez, Dakota Ramirez ([id number]) [id number] Page 2 of 12 Date of Birth/Sex: Treating RN: [birth year] (51  y.Ramirez. Dakota Ramirez Primary Care Dakota Ramirez: Other Clinician: Ishmael Ramirez EL Referring Dakota Ramirez: Treating Dakota Ramirez/Extender: Dakota Ramirez, Dakota Ramirez in Treatment: 0 Clinic Level of Care Assessment Items TOOL 1 Quantity Score X- 1 0 Use when Dakota Ramirez and Procedure is performed on INITIAL visit ASSESSMENTS - Nursing Assessment / Reassessment X- 1 20 General Physical Exam (combine w/ comprehensive assessment (listed just below) when performed on new pt. evals) X- 1 25 Comprehensive Assessment (HX, ROS, Risk Assessments, Wounds Hx, etc.) ASSESSMENTS - Wound and Skin Assessment / Reassessment X- 1 10 Dermatologic / Skin Assessment (not related to wound area) ASSESSMENTS - Ostomy and/or Continence Assessment and Care []  - 0 Incontinence Assessment and Management []  - 0 Ostomy Care Assessment and Management (repouching, etc.) PROCESS - Coordination of Care []  - 0 Simple Patient / Family Education for ongoing care X- 1 20 Complex (extensive) Patient / Family Education for ongoing care X- 1 10 Staff obtains Chiropractor, Records, T Results / Process Orders est []  - 0 Staff telephones HHA, Nursing Homes / Clarify orders / etc []  - 0 Routine Transfer to another Facility (non-emergent condition) []  - 0 Routine Hospital Admission (non-emergent condition) X- 1 15 New Admissions / Manufacturing engineer / Ordering NPWT Apligraf, etc. , []  - 0 Emergency Hospital Admission (emergent condition) PROCESS - Special Needs []  - 0 Pediatric / Minor Patient Management []  - 0 Isolation Patient Management []  - 0 Hearing / Language / Visual special needs []  - 0 Assessment of Community assistance (transportation, D/C planning, etc.) []  - 0 Additional assistance / Altered mentation []  - 0 Support Surface(s) Assessment (bed, cushion, seat, etc.) INTERVENTIONS - Miscellaneous []  - 0 External ear exam []  - 0 Patient Transfer (multiple staff / Nurse, adult / Similar devices) []  -  0 Simple Staple / Suture removal (25 or less) []  - 0 Complex Staple / Suture removal (26 or more) []  - 0 Hypo/Hyperglycemic Management (do not check if billed separately) X- 1 15 Ankle / Brachial Index (ABI) - do not check if billed separately Has the patient been seen at the hospital within  the last three years: Yes Total Score: 115 Level Of Care: New/Established - Level 3 Electronic Signature(s) Signed: 01/24/2023 5:34:50 PM By: Dakota Stall RN, BSN Entered By: Dakota Ramirez on 01/24/2023 16:41:51 Dakota Ramirez ([id number]) [id number] Page 3 of 12 -------------------------------------------------------------------------------- Encounter Discharge Information Details Patient Name: Date of Service: [name], [name] 01/17/2023 2:30 PM Medical Record Number: [id number] Patient Account Number: [id number] Date of Birth/Sex: Treating RN: [birth year] (51 y.Ramirez. Dakota Ramirez Primary Care Lewayne Pauley: Dakota Ramirez EL Other Clinician: Referring Girl Schissler: Treating Ivory Maduro/Extender: Dakota Ramirez, Dakota Ramirez in Treatment: 0 Encounter Discharge Information Items Post Procedure Vitals Discharge Condition: Stable Temperature (F): 97.8 Ambulatory Status: Ambulatory Pulse (bpm): 111 Discharge Destination: Home Respiratory Rate (breaths/min): 18 Transportation: Private Auto Blood Pressure (mmHg): 119/84 Schedule Follow-up Appointment: No Clinical Summary of Care: Electronic Signature(s) Signed: 01/17/2023 6:21:23 PM By: Dakota Stall RN, BSN Entered By: Dakota Ramirez on 01/17/2023 17:13:35 -------------------------------------------------------------------------------- Lower Extremity Assessment Details Patient Name: Date of Service: Dakota Ramirez, Dakota Ramirez 01/17/2023 2:30 PM Medical Record Number: [id number] Patient Account Number: [id number] Date of Birth/Sex: Treating RN: 10/11/1970 (51 y.Ramirez. M) Primary Care Jeanet Lupe: Dakota Ramirez EL Other Clinician: Referring  Tamey Wanek: Treating Tamotsu Wiederholt/Extender: Dakota Ramirez, Dakota Ramirez in Treatment: 0 Edema Assessment Assessed: Kyra Searles: No] [Right: No] [Left: Edema] [Right: :] Calf Left: Right: Point of Measurement: 34 cm From Medial Instep 41 cm 42 cm Ankle Left: Right: Point of Measurement: 14 cm From Medial Instep 27 cm 26 cm Knee To Floor Left: Right: From Medial Instep 43 cm Electronic Signature(s) Signed: 01/21/2023 3:42:51 PM By: Dakota Ramirez Entered By: Dakota Ramirez on 01/17/2023 15:24:20 -------------------------------------------------------------------------------- Multi Wound Chart Details Patient Name: Date of Service: Dakota Ramirez 01/17/2023 2:30 PM Medical Record Number: [id number] Patient Account Number: [id number] Date of Birth/Sex: Treating RN: [birth year] (51 y.Ramirez. Wylan Shelton, Lattie Haw ([id number]) [id number] Page 4 of 12 Primary Care Rich Paprocki: Dakota Ramirez EL Other Clinician: Referring Mats Jeanlouis: Treating Octavie Westerhold/Extender: Dakota Ramirez, Dakota Ramirez in Treatment: 0 Vital Signs Height(in): 71 Pulse(bpm): 111 Weight(lbs): 254 Blood Pressure(mmHg): 119/84 Body Mass Index(BMI): 35.4 Temperature(F): 97.8 Respiratory Rate(breaths/min): 18 [1:Photos:] Right, Plantar Amputation Site - Left Calcaneus Left, Plantar T Great oe Wound Location: Transmetatarsal Gradually Appeared Gradually Appeared Gradually Appeared Wounding Event: Diabetic Wound/Ulcer of the Lower Diabetic Wound/Ulcer of the Lower Diabetic Wound/Ulcer of the Lower Primary Etiology: Extremity Extremity Extremity Sleep Apnea, Congestive Heart Sleep Apnea, Congestive Heart Sleep Apnea, Congestive Heart Comorbid History: Failure, Hypertension, Type II Failure, Hypertension, Type II Failure, Hypertension, Type II Diabetes, Osteomyelitis, Neuropathy Diabetes, Osteomyelitis, Neuropathy Diabetes, Osteomyelitis, Neuropathy 01/11/2021 01/11/2021 11/12/2022 Date Acquired: 0 0  0 Weeks of Treatment: Open Open Open Wound Status: No No No Wound Recurrence: Yes No No Clustered Wound: 2 N/A N/A Clustered Quantity: 4x0.5x0.1 1.6x2.5x1 0.7x0.7x0.6 Measurements L x W x D (cm) 1.571 3.142 0.385 A (cm) : rea 0.157 3.142 0.231 Volume (cm) : 10 3 Starting Position 1 (Ramirez'clock): 1 9 Ending Position 1 (Ramirez'clock): 0.4 0.4 Maximum Distance 1 (cm): 5 Starting Position 2 (Ramirez'clock): 11 Ending Position 2 (Ramirez'clock): 0.5 Maximum Distance 2 (cm): No Yes Yes Undermining: Grade 1 Grade 3 Grade 3 Classification: N/A MRI MRI Wagner Verification: Medium Medium Medium Exudate A mount: Serosanguineous Serosanguineous Serosanguineous Exudate Type: red, brown red, brown red, brown Exudate Color: No Yes No Foul Odor A Cleansing: fter N/A No N/A Odor A nticipated Due to Product Use: Distinct, outline attached Well defined, not attached Distinct, outline attached Wound Margin: Large (67-100%) Large (  67-100%) Medium (34-66%) Granulation A mount: Red Pink, Pale Red Granulation Quality: Small (1-33%) Small (1-33%) Medium (34-66%) Necrotic A mount: Fat Layer (Subcutaneous Tissue): Yes Fat Layer (Subcutaneous Tissue): Yes Fat Layer (Subcutaneous Tissue): Yes Exposed Structures: Fascia: No Fascia: No Bone: Yes Tendon: No Tendon: No Fascia: No Muscle: No Muscle: No Tendon: No Joint: No Joint: No Muscle: No Bone: No Bone: No Joint: No None Small (1-33%) Small (1-33%) Epithelialization: Debridement - Excisional Debridement - Excisional Debridement - Excisional Debridement: Pre-procedure Verification/Time Out 16:05 16:05 16:05 Taken: Lidocaine 4% Topical Solution Lidocaine 4% Topical Solution Lidocaine 4% Topical Solution Pain Control: Callus, Subcutaneous, Slough Callus, Subcutaneous, Slough Callus, Subcutaneous, Slough Tissue Debrided: Skin/Subcutaneous Tissue Skin/Subcutaneous Tissue Skin/Subcutaneous Tissue Level: 0.78 3.14 0.38 Debridement A (sq  cm): rea Curette Curette Curette Instrument: Minimum Minimum Minimum Bleeding: Pressure Pressure Pressure Hemostasis A chieved: 0 0 0 Procedural Pain: 0 0 0 Post Procedural Pain: Procedure was tolerated well Procedure was tolerated well Procedure was tolerated well Debridement Treatment Response: 4x0.5x0.1 1.6x2.5x1 0.7x0.7x0.1 Post Debridement Measurements L x W x D (cm) 0.157 3.142 0.038 Post Debridement Volume: (cm) Callus: Yes Callus: Yes Callus: Yes Periwound Skin TextureKYSHON, Dakota Ramirez ([id number]) [id number] Page 5 of 12 Excoriation: No Excoriation: No Excoriation: No Induration: No Induration: No Induration: No Crepitus: No Crepitus: No Crepitus: No Rash: No Rash: No Rash: No Scarring: No Scarring: No Scarring: No Maceration: No Maceration: No Maceration: No Periwound Skin Moisture: Dry/Scaly: No Dry/Scaly: No Dry/Scaly: No Atrophie Blanche: No Atrophie Blanche: No Atrophie Blanche: No Periwound Skin Color: Cyanosis: No Cyanosis: No Cyanosis: No Ecchymosis: No Ecchymosis: No Ecchymosis: No Erythema: No Erythema: No Erythema: No Hemosiderin Staining: No Hemosiderin Staining: No Hemosiderin Staining: No Mottled: No Mottled: No Mottled: No Pallor: No Pallor: No Pallor: No Rubor: No Rubor: No Rubor: No N/A No Abnormality N/A Temperature: Debridement Debridement Debridement Procedures Performed: Treatment Notes Electronic Signature(s) Signed: 01/18/2023 7:57:57 AM By: Baltazar Najjar MD Entered By: Baltazar Najjar on 01/17/2023 16:41:42 -------------------------------------------------------------------------------- Multi-Disciplinary Care Plan Details Patient Name: Date of Service: Dakota Ramirez. 01/17/2023 2:30 PM Medical Record Number: [id number] Patient Account Number: [id number] Date of Birth/Sex: Treating RN: 01-13-1971 (51 y.Ramirez. Dakota Ramirez Primary Care Minh Jasper: Dakota Ramirez EL Other  Clinician: Referring Jennavecia Schwier: Treating Emie Sommerfeld/Extender: Dakota Ramirez, Dakota Ramirez in Treatment: 0 Active Inactive Electronic Signature(s) Signed: 01/24/2023 4:41:15 PM By: Dakota Stall RN, BSN Previous Signature: 01/17/2023 6:21:23 PM Version By: Dakota Stall RN, BSN Entered By: Dakota Ramirez on 01/24/2023 16:41:14 -------------------------------------------------------------------------------- Pain Assessment Details Patient Name: Date of Service: Dakota Ramirez 01/17/2023 2:30 PM Medical Record Number: [id number] Patient Account Number: [id number] Date of Birth/Sex: Treating RN: [birth year] (51 y.Ramirez. Dakota Ramirez Primary Care Phiona Ramnauth: Dakota Ramirez EL Other Clinician: Referring Tenoch Mcclure: Treating Jennifermarie Franzen/Extender: Dakota Ramirez, Dakota Ramirez in Treatment: 0 Active Problems Location of Pain Severity and Description of Pain Patient Has Paino No Site Locations [place] ([id number]) [phone].pdf Page 6 of 12 Pain Management and Medication Current Pain Management: Electronic Signature(s) Signed: 01/17/2023 6:21:23 PM By: Dakota Stall RN, BSN Entered By: Dakota Ramirez on 01/17/2023 16:08:28 -------------------------------------------------------------------------------- Patient/Caregiver Education Details Patient Name: Date of Service: Dakota Ramirez 6/6/2024andnbsp2:30 PM Medical Record Number: [id number] Patient Account Number: [id number] Date of Birth/Gender: Treating RN: 08-01-1971 (51 y.Ramirez. Dakota Ramirez Primary Care Physician: Dakota Ramirez EL Other Clinician: Referring Physician: Treating Physician/Extender: Roderic Ovens in Treatment: 0 Education Assessment Education Provided To: Patient Education Topics Provided Welcome  T [facility]New [facility] Packet: Ramirez Handouts: [facility] P[facility] form, Welcome T [facility] Ramirez Methods: Explain/Verbal,  Printed Responses: Reinforcements needed Wound/Skin Impairment: Handouts: Caring for Your Ulcer Methods: Explain/Verbal Responses: Reinforcements needed Electronic Signature(s) Signed: 01/17/2023 6:21:23 PM By: Dakota Stall RN, BSN Entered By: Dakota Ramirez on 01/17/2023 16:05:02 Dakota Ramirez ([id number]) [id number] Page 7 of 12 -------------------------------------------------------------------------------- Wound Assessment Details Patient Name: Date of Service: Dakota Ramirez, Dakota Ramirez 01/17/2023 2:30 PM Medical Record Number: [id number] Patient Account Number: [id number] Date of Birth/Sex: Treating RN: 10/07/1970 (51 y.Ramirez. 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 Location: Right, Plantar Amputation Site - Transmetatarsal Wound Open Wounding Event: Gradually Appeared Status: Date Acquired: 01/11/2021 Comorbid Sleep Apnea, Congestive Heart Failure, Hypertension, Type II Weeks Of Treatment: 0 History: Diabetes, Osteomyelitis, Neuropathy Clustered Wound: Yes Photos Wound Measurements Length: (cm) Width: (cm) Depth: (cm) Clustered Quantity: Area: (cm) Volume: (cm) 4 % Reduction in Area: 0.5 % Reduction in Volume: 0.1 Epithelialization: None 2 Tunneling: No 1.571 Undermining: No 0.157 Wound Description Classification: Grade 1 Wound Margin: Distinct, outline attached Exudate Amount: Medium Exudate Type: Serosanguineous Exudate Color: red, brown Foul Odor After Cleansing: No Slough/Fibrino Yes Wound Bed Granulation Amount: Large (67-100%) Exposed Structure Granulation Quality: Red Fascia Exposed: No Necrotic Amount: Small (1-33%) Fat Layer (Subcutaneous Tissue) Exposed: Yes Necrotic Quality: Adherent Slough Tendon Exposed: No Muscle  Exposed: No Joint Exposed: No Bone Exposed: No Periwound Skin Texture Texture Color No Abnormalities Noted: No No Abnormalities Noted: No Callus: Yes Atrophie Blanche: No Crepitus: No Cyanosis: No Excoriation: No Ecchymosis: No Induration: No Erythema: No Rash: No Hemosiderin Staining: No Scarring: No Mottled: No Pallor: No Moisture Rubor: No No Abnormalities Noted: No Dry / Scaly: No Maceration: No Treatment Notes Wound #1 (Amputation Site - Transmetatarsal) Wound Laterality: Plantar, Right Dakota Ramirez, Dakota Ramirez ([id number]) [id number] Page 8 of 12 Cleanser Soap and Water Discharge Instruction: May shower and wash wound with dial antibacterial soap and water prior to dressing change. Peri-Wound Care Topical Primary Dressing Maxorb Extra Ag+ Alginate Dressing, 4x4.75 (in/in) Discharge Instruction: Apply to wound bed as instructed Secondary Dressing ABD Pad, 8x10 Discharge Instruction: Apply over primary dressing as directed. Secured With [employer], 4.5x3.1 (in/yd) Discharge Instruction: Secure with Kerlix as directed. 3M Medipore H Soft Cloth Surgical T ape, 4 x 10 (in/yd) Discharge Instruction: Secure with tape as directed. Compression Wrap Compression Stockings Add-Ons Electronic Signature(s) Signed: 01/17/2023 6:21:23 PM By: Dakota Stall RN, BSN Entered By: Dakota Ramirez on 01/17/2023 16:08:56 -------------------------------------------------------------------------------- Wound Assessment Details Patient Name: Date of Service: Dakota Ramirez 01/17/2023 2:30 PM Medical Record Number: [id number] Patient Account Number: [id number] Date of Birth/Sex: Treating RN: 01/06/1971 (51 y.Ramirez. M) Primary Care Nakai Yard: Dakota Ramirez EL Other Clinician: Referring Bryndan Bilyk: Treating Jenavive Lamboy/Extender: Dakota Ramirez, Dakota Ramirez in Treatment: 0 Wound Status Wound Number: 2 Primary Diabetic Wound/Ulcer of the Lower  Extremity Etiology: Wound Location: Left Calcaneus Wound Open Wounding Event: Gradually Appeared Status: Date Acquired: 01/11/2021 Comorbid Sleep Apnea, Congestive Heart Failure, Hypertension, Type II Weeks Of Treatment: 0 History: Diabetes, Osteomyelitis, Neuropathy Clustered Wound: No Photos Wound Measurements Length: (cm) 1.6 Width: (cm) 2.5 Depth: (cm) 1 Dakota Ramirez, Dakota Ramirez ([id number]) Area: (cm) 3.142 Volume: (cm) 3.142 % Reduction in Area: % Reduction in Volume: Epithelialization: Small (1-33%) [id number] Page 9 of 12  Tunneling: No Undermining: Yes Location 1 Starting Position (Ramirez'clock): 10 Ending Position (Ramirez'clock): 1 Maximum Distance: (cm) 0.4 Location 2 Starting Position (Ramirez'clock): 5 Ending Position (Ramirez'clock): 11 Maximum Distance: (cm) 0.5 Wound Description Classification: Grade 3 Wagner Verification: MRI Wound Margin: Well defined, not attached Exudate Amount: Medium Exudate Type: Serosanguineous Exudate Color: red, brown Foul Odor After Cleansing: Yes Due to Product Use: No Slough/Fibrino Yes Wound Bed Granulation Amount: Large (67-100%) Exposed Structure Granulation Quality: Pink, Pale Fascia Exposed: No Necrotic Amount: Small (1-33%) Fat Layer (Subcutaneous Tissue) Exposed: Yes Necrotic Quality: Adherent Slough Tendon Exposed: No Muscle Exposed: No Joint Exposed: No Bone Exposed: No Periwound Skin Texture Texture Color No Abnormalities Noted: No No Abnormalities Noted: No Callus: Yes Atrophie Blanche: No Crepitus: No Cyanosis: No Excoriation: No Ecchymosis: No Induration: No Erythema: No Rash: No Hemosiderin Staining: No Scarring: No Mottled: No Pallor: No Moisture Rubor: No No Abnormalities Noted: No Dry / Scaly: No Temperature / Pain Maceration: No Temperature: No Abnormality Treatment Notes Wound #2 (Calcaneus) Wound Laterality: Left Cleanser Soap and Water Discharge Instruction: May shower and wash  wound with dial antibacterial soap and water prior to dressing change. Peri-Wound Care Topical Primary Dressing Maxorb Extra Ag+ Alginate Dressing, 4x4.75 (in/in) Discharge Instruction: Apply to wound bed as instructed Secondary Dressing ABD Pad, 8x10 Discharge Instruction: Apply over primary dressing as directed. Secured With [employer], 4.5x3.1 (in/yd) Discharge Instruction: Secure with Kerlix as directed. 81M Medipore H Soft Cloth Surgical T ape, 4 x 10 (in/yd) Discharge Instruction: Secure with tape as directed. Compression Wrap Compression Stockings Add-Ons Dakota Ramirez, RUNSER ([id number]) [id number] Page 10 of 12 Electronic Signature(s) Signed: 01/17/2023 6:21:23 PM By: Dakota Stall RN, BSN Entered By: Dakota Ramirez on 01/17/2023 16:09:29 -------------------------------------------------------------------------------- Wound Assessment Details Patient Name: Date of Service: Dakota Ramirez, Dakota Ramirez 01/17/2023 2:30 PM Medical Record Number: [id number] Patient Account Number: [id number] Date of Birth/Sex: Treating RN: 1971/05/19 (51 y.Ramirez. M) Primary Care Sadey Yandell: Dakota Ramirez EL Other Clinician: Referring Reshard Guillet: Treating Jerardo Costabile/Extender: Dakota Ramirez, Dakota Ramirez in Treatment: 0 Wound Status Wound Number: 3 Primary Diabetic Wound/Ulcer of the Lower Extremity Etiology: Wound Location: Left, Plantar T Great oe Wound Open Wounding Event: Gradually Appeared Status: Date Acquired: 11/12/2022 Comorbid Sleep Apnea, Congestive Heart Failure, Hypertension, Type II Weeks Of Treatment: 0 History: Diabetes, Osteomyelitis, Neuropathy Clustered Wound: No Photos Wound Measurements Length: (cm) 0.7 Width: (cm) 0.7 Depth: (cm) 0.6 Area: (cm) 0.385 Volume: (cm) 0.231 % Reduction in Area: % Reduction in Volume: Epithelialization: Small (1-33%) Tunneling: No Undermining: Yes Starting Position (Ramirez'clock): 3 Ending Position (Ramirez'clock): 9 Maximum  Distance: (cm) 0.4 Wound Description Classification: Grade 3 Wagner Verification: MRI Wound Margin: Distinct, outline attached Exudate Amount: Medium Exudate Type: Serosanguineous Exudate Color: red, brown Foul Odor After Cleansing: No Slough/Fibrino Yes Wound Bed Granulation Amount: Medium (34-66%) Exposed Structure Granulation Quality: Red Fascia Exposed: No Necrotic Amount: Medium (34-66%) Fat Layer (Subcutaneous Tissue) Exposed: Yes Necrotic Quality: Adherent Slough Tendon Exposed: No Muscle Exposed: No Joint Exposed: No Bone Exposed: Yes Periwound Skin Texture Texture Color No Abnormalities Noted: No No Abnormalities Noted: No VALMORE, RAMOUTAR ([id number]) [id number] Page 11 of 12 Callus: Yes Atrophie Blanche: No Crepitus: No Cyanosis: No Excoriation: No Ecchymosis: No Induration: No Erythema: No Rash: No Hemosiderin Staining: No Scarring: No Mottled: No Pallor: No Moisture Rubor: No No Abnormalities Noted: No Dry / Scaly: No Maceration: No Treatment Notes Wound #3 (Toe Great) Wound Laterality: Plantar, Left Cleanser Soap and Water Discharge Instruction: May shower and wash  wound with dial antibacterial soap and water prior to dressing change. Peri-Wound Care Topical Primary Dressing Maxorb Extra Ag+ Alginate Dressing, 4x4.75 (in/in) Discharge Instruction: Apply to wound bed as instructed Secondary Dressing Woven Gauze Sponges 2x2 in Discharge Instruction: Apply over primary dressing as directed. Secured With Conforming Stretch Gauze Bandage, Sterile 2x75 (in/in) Discharge Instruction: Secure with stretch gauze as directed. 80M Medipore H Soft Cloth Surgical T ape, 4 x 10 (in/yd) Discharge Instruction: Secure with tape as directed. Compression Wrap Compression Stockings Add-Ons Electronic Signature(s) Signed: 01/17/2023 6:21:23 PM By: Dakota Stall RN, BSN Entered By: Dakota Ramirez on 01/17/2023  16:09:53 -------------------------------------------------------------------------------- Vitals Details Patient Name: Date of Service: Dakota Ramirez 01/17/2023 2:30 PM Medical Record Number: [id number] Patient Account Number: [id number] Date of Birth/Sex: Treating RN: 05-19-1971 (51 y.Ramirez. M) Primary Care Sonam Huelsmann: Dakota Ramirez EL Other Clinician: Referring Caitlin Hillmer: Treating Kayson Tasker/Extender: Dakota Ramirez, Dakota Ramirez in Treatment: 0 Vital Signs Time Taken: 14:33 Temperature (F): 97.8 Height (in): 71 Pulse (bpm): 111 Source: Stated Respiratory Rate (breaths/min): 18 Weight (lbs): 254 Blood Pressure (mmHg): 119/84 Source: Stated Reference Range: 80 - 120 mg / dl Body Mass Index (BMI): 35.4 Electronic Signature(s) Signed: 01/21/2023 3:42:51 PM By: Verdene Rio, Ronaldo Miyamoto Ramirez 01/21/2023 3:42:51 PM By: Dakota Ramirez Signed: ([id number]) [id number] Page 12 of 12 Entered By: Dakota Ramirez on 01/17/2023 14:35:32

## 2023-01-28 ENCOUNTER — Encounter (HOSPITAL_BASED_OUTPATIENT_CLINIC_OR_DEPARTMENT_OTHER): Payer: 59 | Admitting: Internal Medicine

## 2023-01-28 ENCOUNTER — Telehealth: Payer: Self-pay | Admitting: Dietician

## 2023-01-28 ENCOUNTER — Other Ambulatory Visit: Payer: Self-pay | Admitting: Student

## 2023-01-28 ENCOUNTER — Ambulatory Visit (HOSPITAL_BASED_OUTPATIENT_CLINIC_OR_DEPARTMENT_OTHER): Payer: 59 | Admitting: Internal Medicine

## 2023-01-28 ENCOUNTER — Encounter: Payer: 59 | Admitting: Dietician

## 2023-01-28 MED ORDER — DEXCOM G7 SENSOR MISC
3 refills | Status: DC
Start: 2023-01-28 — End: 2023-06-26

## 2023-01-28 MED ORDER — DEXCOM G7 RECEIVER DEVI
1 refills | Status: DC
Start: 2023-01-28 — End: 2023-06-26

## 2023-01-28 NOTE — Addendum Note (Signed)
Addended by: Daiva Eves, Byrd Hesselbach on: 01/28/2023 02:07 PM   Modules accepted: Orders

## 2023-01-28 NOTE — Telephone Encounter (Signed)
Trying to call patient to let him know to cancel today's appointment until he has picked up [facility] receiver and sensors. Mailbox was full, unable to leave a message.

## 2023-01-31 ENCOUNTER — Ambulatory Visit (INDEPENDENT_AMBULATORY_CARE_PROVIDER_SITE_OTHER): Payer: 59 | Admitting: Orthopedic Surgery

## 2023-01-31 ENCOUNTER — Other Ambulatory Visit: Payer: Self-pay | Admitting: Student

## 2023-01-31 ENCOUNTER — Encounter: Payer: Self-pay | Admitting: Orthopedic Surgery

## 2023-01-31 DIAGNOSIS — S98112A Complete traumatic amputation of left great toe, initial encounter: Secondary | ICD-10-CM

## 2023-01-31 DIAGNOSIS — Z89412 Acquired absence of left great toe: Secondary | ICD-10-CM

## 2023-01-31 NOTE — Progress Notes (Signed)
Office Visit Note   Patient: Dakota Ramirez           Date of Birth: 16-Nov-1970           MRN: [id number] Visit Date: 01/31/2023              Requested by: Annett Fabian, MD [address] [place],  [place] [address] PCP: Annett Fabian, MD  Chief Complaint  Patient presents with   Left Foot - Routine Post Op    01/25/2023 left Great toe amputation and heel debridement       HPI: Patient is a 52 year old gentleman who is 1 week status post amputation left great toe and heel debridement.  He is nonweightbearing with crutches.  Assessment & Plan: Visit Diagnoses:  1. 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  Continue nonweightbearing.  Follow-Up Instructions: Return in about 1 week (around 02/07/2023).   Ortho Exam  Patient is alert, oriented, no adenopathy, well-dressed, normal affect, normal respiratory effort. Examination the incision for the left great toe amputation is healing well.  The heel ulcer has granulation tissue at the base with maceration around the wound edges.  Will start with dry dressing changes may need to reinforce with tissue graft as the wound resolves.  Imaging: No results found.    Labs: Lab Results  Component Value Date   HGBA1C 11.2 (H) 12/26/2022   HGBA1C 12.4 (H) 11/26/2021   ESRSEDRATE 68 (H) 12/28/2022   CRP 5.6 (H) 12/28/2022     Lab Results  Component Value Date   ALBUMIN 1.8 (L) 12/26/2022   ALBUMIN 2.5 (L) 11/29/2021   ALBUMIN 2.8 (L) 11/26/2021    Lab Results  Component Value Date   MG 1.4 (L) 12/29/2022   MG 1.8 12/28/2022   MG 1.2 (L) 12/27/2022   No results found for: "VD25OH"  No results found for: "PREALBUMIN"    Latest Ref Rng & Units 01/25/2023    8:34 AM 12/26/2022    2:19 PM 12/26/2022   11:32 AM  CBC EXTENDED  WBC 4.0 - 10.5 K/uL 8.0  5.8  5.8   RBC 4.22 - 5.81 MIL/uL 5.50  4.64  4.99    Hemoglobin 13.0 - 17.0 g/dL 25.9  56.3  87.5   HCT 39.0 - 52.0 % 46.7  41.7  44.2   Platelets 150 - 400 K/uL 212  247  238      There is no height or weight on file to calculate BMI.  Orders:  No orders of the defined types were placed in this encounter.  No orders of the defined types were placed in this encounter.    Procedures: No procedures performed  Clinical Data: No additional findings.  ROS:  All other systems negative, except as noted in the HPI. Review of Systems  Objective: Vital Signs: There were no vitals taken for this visit.  Specialty Comments:  No specialty comments available.  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) 12/28/2022   Severe protein-calorie malnutrition (HCC) 12/28/2022   Elevated alkaline phosphatase level 12/27/2022  Acute on chronic heart failure (HCC) 12/26/2022   Hyponatremia 11/29/2021   PVD (peripheral vascular disease) (HCC) 11/29/2021   Class 2 obesity 11/29/2021   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) 11/26/2021   Type 2 diabetes mellitus with foot ulcer, with long-term current use of insulin (HCC) 11/26/2021   Essential hypertension 11/26/2021   Past Medical History:  Diagnosis Date   CHF (congestive heart failure) (HCC)    Diabetes mellitus without complication (HCC)    Type II   Heart failure with reduced ejection fraction (HCC) 11/26/2021   Hypertension     Family History  Problem Relation Age of Onset   Diabetes Mother    Hypertension Mother     Past Surgical History:  Procedure Laterality Date   AMPUTATION Left 01/25/2023   Procedure: LEFT GREAT TOE AMPUTATION AND DEBRIDEMENT OF HEEL;   Surgeon: Nadara Mustard, MD;  Location: [facility] OR;  Service: Orthopedics;  Laterality: Left;   BACK SURGERY     CHOLECYSTECTOMY     RIGHT/LEFT HEART CATH AND CORONARY ANGIOGRAPHY N/A 11/30/2021   Procedure: RIGHT/LEFT HEART CATH AND CORONARY ANGIOGRAPHY;  Surgeon: Dolores Patty, MD;  Location: [facility] IN[facility]ASIVE [facility];  Service: Cardiovascular;  Laterality: N/A;   TOE AMPUTATION Right    all toes on RT foot   Social History   Occupational History   Not on file  Tobacco Use   Smoking status: Never   Smokeless tobacco: Never  Vaping Use   Vaping Use: Never used  Substance and Sexual Activity   Alcohol use: Not Currently    Comment: rare   Drug use: Never   Sexual activity: Not on file

## 2023-02-08 ENCOUNTER — Encounter (HOSPITAL_COMMUNITY): Payer: 59

## 2023-02-12 NOTE — Progress Notes (Signed)
Dakota Ramirez ([id number]) [id number] Page 1 of 11 Visit Report for 01/17/2023 Debridement Details Patient Name: Date of Service: Dakota Ramirez, Dakota Ramirez 01/17/2023 2:30 PM Medical Record Number: [id number] Patient Account Number: [id number] Date of Birth/Sex: Treating RN: 1971/07/04 ([name]) Primary Care Provider: Ishmael Ramirez EL Other Clinician: Referring Provider: Treating Provider/Extender: Dakota Ramirez, Dakota Ramirez in Treatment: 0 Debridement Performed for Assessment: Wound #1 Right,Plantar Amputation Site - Transmetatarsal Performed By: Physician Dakota Ramirez., MD Debridement Type: Debridement Severity of Tissue Pre Debridement: Fat layer exposed Level of Consciousness (Pre-procedure): Awake and Alert Pre-procedure Verification/Time Out Yes - 16:05 Taken: Start Time: 16:06 Pain Control: Lidocaine 4% T opical Solution Percent of Wound Bed Debrided: 50% T Area Debrided (cm): otal 0.78 Tissue and other material debrided: Viable, Non-Viable, Callus, Slough, Subcutaneous, Skin: Dermis , Skin: Epidermis, Biofilm, Slough Level: Skin/Subcutaneous Tissue Debridement Description: Excisional Instrument: Curette Bleeding: Minimum Hemostasis Achieved: Pressure End Time: 16:12 Procedural Pain: 0 Post Procedural Pain: 0 Response to Treatment: Procedure was tolerated well Level of Consciousness (Post- Awake and Alert procedure): Post Debridement Measurements of Total Wound Length: (cm) 4 Width: (cm) 0.5 Depth: (cm) 0.1 Volume: (cm) 0.157 Character of Wound/Ulcer Post Debridement: Requires Further Debridement Severity of Tissue Post Debridement: Fat layer exposed Post Procedure Diagnosis Same as Pre-procedure Electronic Signature(s) Signed: 01/18/2023 7:57:57 AM By: Dakota Najjar MD Entered By: Dakota Ramirez on 01/17/2023 16:41:54 -------------------------------------------------------------------------------- Debridement  Details Patient Name: Date of Service: Dakota Ramirez 01/17/2023 2:30 PM Medical Record Number: [id number] Patient Account Number: [id number] Date of Birth/Sex: Treating RN: May 14, 1971 ([name]) Primary Care Provider: Ishmael Ramirez EL Other Clinician: Referring Provider: Treating Provider/Extender: Dakota Ramirez, Dakota Ramirez in Treatment: 0 Debridement Performed for Assessment: Wound #2 Left Calcaneus Performed By: Physician Dakota Ramirez., MD Dakota Ramirez ([id number]) [phone].pdf Page 2 of 11 Debridement Type: Debridement Severity of Tissue Pre Debridement: Fat layer exposed Level of Consciousness (Pre-procedure): Awake and Alert Pre-procedure Verification/Time Out Yes - 16:05 Taken: Start Time: 16:06 Pain Control: Lidocaine 4% T opical Solution Percent of Wound Bed Debrided: 100% T Area Debrided (cm): otal 3.14 Tissue and other material debrided: Viable, Non-Viable, Callus, Slough, Subcutaneous, Skin: Dermis , Skin: Epidermis, Biofilm, Slough Level: Skin/Subcutaneous Tissue Debridement Description: Excisional Instrument: Curette Bleeding: Minimum Hemostasis Achieved: Pressure End Time: 16:12 Procedural Pain: 0 Post Procedural Pain: 0 Response to Treatment: Procedure was tolerated well Level of Consciousness (Post- Awake and Alert procedure): Post Debridement Measurements of Total Wound Length: (cm) 1.6 Width: (cm) 2.5 Depth: (cm) 1 Volume: (cm) 3.142 Character of Wound/Ulcer Post Debridement: Requires Further Debridement Severity of Tissue Post Debridement: Fat layer exposed Post Procedure Diagnosis Same as Pre-procedure Electronic Signature(s) Signed: 01/18/2023 7:57:57 AM By: Dakota Najjar MD Entered By: Dakota Ramirez on 01/17/2023 16:42:01 -------------------------------------------------------------------------------- Debridement Details Patient Name: Date of Service: Dakota Ramirez 01/17/2023 2:30 PM Medical  Record Number: [id number] Patient Account Number: [id number] Date of Birth/Sex: Treating RN: Sep 04, 1970 ([name]) Primary Care Provider: Ishmael Ramirez EL Other Clinician: Referring Provider: Treating Provider/Extender: Dakota Ramirez, Dakota Ramirez in Treatment: 0 Debridement Performed for Assessment: Wound #3 Left,Plantar T Great oe Performed By: Physician Dakota Ramirez., MD Debridement Type: Debridement Severity of Tissue Pre Debridement: Fat layer exposed Level of Consciousness (Pre-procedure): Awake and Alert Pre-procedure Verification/Time Out Yes - 16:05 Taken: Start Time: 16:06 Pain Control: Lidocaine 4% T opical Solution Percent of Wound Bed Debrided: 100% T Area Debrided (cm): otal 0.38 Tissue and other  material debrided: Viable, Non-Viable, Callus, Slough, Subcutaneous, Skin: Dermis , Skin: Epidermis, Biofilm, Slough Level: Skin/Subcutaneous Tissue Debridement Description: Excisional Instrument: Curette Bleeding: Minimum Hemostasis Achieved: Pressure End Time: 16:12 Procedural Pain: 0 Post Procedural Pain: 0 Response to Treatment: Procedure was tolerated well Level of Consciousness (Post- Awake and Alert procedure): Dakota Ramirez, Dakota Ramirez ([id number]) [id number] Page 3 of 11 Post Debridement Measurements of Total Wound Length: (cm) 0.7 Width: (cm) 0.7 Depth: (cm) 0.1 Volume: (cm) 0.038 Character of Wound/Ulcer Post Debridement: Requires Further Debridement Severity of Tissue Post Debridement: Fat layer exposed Post Procedure Diagnosis Same as Pre-procedure Electronic Signature(s) Signed: 01/18/2023 7:57:57 AM By: Dakota Najjar MD Entered By: Dakota Ramirez on 01/17/2023 16:42:08 -------------------------------------------------------------------------------- HPI Details Patient Name: Date of Service: Dakota Ramirez 01/17/2023 2:30 PM Medical Record Number: [id number] Patient Account Number: [id number] Date of Birth/Sex:  Treating RN: 09-18-1970 (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 He was hospitalized at [facility] from 5/15 through 12/29/2022 predominantly with acute on chronic heart failure. He was also noted to have osteomyelitis of the left great toe based on an MRI. He was treated with Augmentin and doxycycline which I think are actually being completed tomorrow. He saw Dr. Lajoyce Corners in hospital recommended nonsurgical management following up with him tomorrow. He also has a deep wound on the left plantar heel but there was no evidence of infection in this area Finally he has had a previous right TMA in 2022. He has a small area on the plantar aspect of this wound very thick redundant callus over this entire area. He lives in [place]. He is not offloading this at all. He has not specifically been dressing these wounds only covering them superficially. Past medical history type 2 diabetes with polyneuropathy severe systolic CHF with an EF of less than 20%, he was followed for a while by podiatry in 2023 at [facility] at which time he had an ulcer on the heel I do not think this was ever completely resolved. He had arterial studies done in the hospital showing an ABI of 1.10 on the right and 1.05 on the left with multiphasic waveforms on the right and triphasic on the left. He has a right great toe amputation and an ulcer on the left great toe TBI's were not done Electronic Signature(s) Signed: 01/18/2023 7:57:57 AM By: Dakota Najjar MD Entered By: Dakota Ramirez on 01/17/2023 16:48:47 -------------------------------------------------------------------------------- Physical Exam Details Patient Name: Date of Service: Dakota Ramirez 01/17/2023 2:30  PM Medical Record Number: [id number] Patient Account Number: [id number] Date of Birth/Sex: Treating RN: November 03, 1970 ([name]) Primary Care Provider: Ishmael Ramirez EL Other Clinician: Referring Provider: Treating Provider/Extender: Dakota Ramirez, Dakota Ramirez in Treatment: 0 Constitutional Sitting or standing Blood Pressure is within target range for patient.. Pulse regular and within target range for patient.Marland Kitchen Respirations regular, non-labored and within target range.. Temperature is normal and within the target range for the patient.Marland Kitchen Appears in no distress. Dakota Ramirez, Dakota Ramirez ([id number]) [id number] Page 4 of 11 Cardiovascular Pedal pulses are palpable. Notes Wound exam; Left first toe plantar aspect. This is a deep probing wound that eventually goes to bone. Completely necrotic surface I remove callus skin and subcutaneous tissue. Does clean up reasonably well. He also has a large punched-out wound on the left plantar  heel. I could not probe this to bone however it is precariously close this was also debrided of callus skin and necrotic subcutaneous tissue. On the right TMA site medially he has a small superficial wound thick redundant callus and subcutaneous tissue which I removed with a #5 curette over a small part of the area Electronic Signature(s) Signed: 01/18/2023 7:57:57 AM By: Dakota Najjar MD Entered By: Dakota Ramirez on 01/17/2023 16:50:28 -------------------------------------------------------------------------------- Physician Orders Details Patient Name: Date of Service: Dakota Ramirez 01/17/2023 2:30 PM Medical Record Number: [id number] Patient Account Number: [id number] Date of Birth/Sex: Treating RN: Nov 10, 1970 ([name]) Shawn Stall Primary Care Provider: Ishmael Ramirez EL Other Clinician: Referring Provider: Treating Provider/Extender: Dakota Ramirez, Dakota Ramirez in Treatment: 0 Verbal / Phone Orders:  No Diagnosis Coding Follow-up Appointments ppointment in 1 week. - Dr. Mikey Bussing 01/28/2023 1700 room 8 Monday Return A Other: - Patient seeing Dr. Lajoyce Corners tomorrow for a consultation as well. Patient to call wound center if decide to go with Dr. Lajoyce Corners. Anesthetic (In clinic) Topical Lidocaine 4% applied to wound bed Off-Loading Open toe surgical shoe to: - with Peg Assist to both surgical shoes. Wound Treatment Wound #1 - Amputation Site - Transmetatarsal Wound Laterality: Plantar, Right Cleanser: Soap and Water Every Other Day/15 Days Discharge Instructions: May shower and wash wound with dial antibacterial soap and water prior to dressing change. Prim Dressing: Maxorb Extra Ag+ Alginate Dressing, 4x4.75 (in/in) Every Other Day/15 Days ary Discharge Instructions: Apply to wound bed as instructed Secondary Dressing: ABD Pad, 8x10 Every Other Day/15 Days Discharge Instructions: Apply over primary dressing as directed. Secured With: [employer], 4.5x3.1 (in/yd) Every Other Day/15 Days Discharge Instructions: Secure with Kerlix as directed. Secured With: 84M Medipore H Soft Cloth Surgical T ape, 4 x 10 (in/yd) Every Other Day/15 Days Discharge Instructions: Secure with tape as directed. Wound #2 - Calcaneus Wound Laterality: Left Cleanser: Soap and Water Every Other Day/15 Days Discharge Instructions: May shower and wash wound with dial antibacterial soap and water prior to dressing change. Prim Dressing: Maxorb Extra Ag+ Alginate Dressing, 4x4.75 (in/in) (DME) (Generic) Every Other Day/15 Days ary Discharge Instructions: Apply to wound bed as instructed Secondary Dressing: ABD Pad, 8x10 (DME) (Generic) Every Other Day/15 Days Discharge Instructions: Apply over primary dressing as directed. Secured With: [employer], 4.5x3.1 (in/yd) (DME) (Generic) Every Other Day/15 Days Discharge Instructions: Secure with Kerlix as directed. GEE, KERBEL ([id number])  [id number] Page 5 of 11 Secured With: 84M Medipore H Soft Cloth Surgical T ape, 4 x 10 (in/yd) (DME) (Generic) Every Other Day/15 Days Discharge Instructions: Secure with tape as directed. Wound #3 - T Great oe Wound Laterality: Plantar, Left Cleanser: Soap and Water Every Other Day/15 Days Discharge Instructions: May shower and wash wound with dial antibacterial soap and water prior to dressing change. Prim Dressing: Maxorb Extra Ag+ Alginate Dressing, 4x4.75 (in/in) (DME) (Generic) Every Other Day/15 Days ary Discharge Instructions: Apply to wound bed as instructed Secondary Dressing: Woven Gauze Sponges 2x2 in (DME) (Generic) Every Other Day/15 Days Discharge Instructions: Apply over primary dressing as directed. Secured With: Insurance underwriter, Sterile 2x75 (in/in) (DME) (Generic) Every Other Day/15 Days Discharge Instructions: Secure with stretch gauze as directed. Secured With: 84M Medipore H Soft Cloth Surgical T ape, 4 x 10 (in/yd) (DME) (Generic) Every Other Day/15 Days Discharge Instructions: Secure with tape as directed. Electronic Signature(s) Signed: 01/17/2023 6:21:23 PM By: Shawn Stall RN, BSN Signed: 01/18/2023 7:57:57 AM By: Leanord Hawking  Casimiro Needle MD Entered By: Shawn Stall on 01/17/2023 16:19:33 -------------------------------------------------------------------------------- Problem List Details Patient Name: Date of Service: Dakota Ramirez, Dakota Ramirez 01/17/2023 2:30 PM Medical Record Number: [id number] Patient Account Number: [id number] Date of Birth/Sex: Treating RN: August 20, 1970 (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 01/17/2023 No Yes L97.518 Non-pressure chronic ulcer of other part of right foot with other specified 01/17/2023 No Yes severity Inactive Problems Resolved Problems Dakota Ramirez, Dakota Ramirez ([id number]) [id number] Page 6 of 11 Electronic Signature(s) Signed: 01/18/2023 7:57:57 AM By: Dakota Najjar MD Entered By: Dakota Ramirez on 01/17/2023 16:41:34 -------------------------------------------------------------------------------- Progress Note Details Patient Name: Date of Service: Dakota Ramirez 01/17/2023 2:30 PM Medical Record Number: [id number] Patient Account Number: [id number] Date of Birth/Sex: Treating RN: Dec 08, 1970 ([name]) Primary Care Provider: Ishmael Ramirez EL Other Clinician: Referring Provider: Treating Provider/Extender: Dakota Ramirez, Dakota Ramirez in Treatment: 0 Subjective History of Present Illness (HPI) Mr. 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 He was hospitalized at [facility] from 5/15 through 12/29/2022 predominantly with acute on chronic heart failure. He was also noted to have osteomyelitis of the left great toe based on an MRI. He was treated with Augmentin and doxycycline which I think are actually being completed tomorrow. He saw Dr. Lajoyce Corners in hospital recommended nonsurgical management following up with him tomorrow. He also has a deep wound on the left plantar heel but there was no evidence of infection in this area Finally he has had a previous right TMA in 2022. He has a small area on the plantar aspect of this wound very thick redundant callus over this entire area. He lives in [place]. He is not offloading this at all. He has not specifically been dressing these wounds  only covering them superficially. Past medical history type 2 diabetes with polyneuropathy severe systolic CHF with an EF of less than 20%, he was followed for a while by podiatry in 2023 at [facility] at which time he had an ulcer on the heel I do not think this was ever completely resolved. He had arterial studies done in the hospital showing an ABI of 1.10 on the right and 1.05 on the left with multiphasic waveforms on the right and triphasic on the left. He has a right great toe amputation and an ulcer on the left great toe TBI's were not done Patient History Information obtained from Patient. Allergies No Known Drug Allergies Family History Cancer - Father, Diabetes - Mother, Heart Disease - Mother, Hypertension - Mother, Thyroid Problems - Mother, No family history of Hereditary Spherocytosis, Kidney Disease, Lung Disease, Seizures, Stroke, Tuberculosis. Social History Never smoker, Marital Status - Widowed, Alcohol Use - Never, Drug Use - No History, Caffeine Use - Daily. Medical History Eyes Denies history of Cataracts, Glaucoma, Optic Neuritis Ear/Nose/Mouth/Throat Denies history of Chronic sinus problems/congestion, Middle ear problems Hematologic/Lymphatic Denies history of Anemia, Hemophilia, Human Immunodeficiency Virus, Lymphedema, Sickle Cell Disease Respiratory Patient has history of Sleep Apnea Denies history of Aspiration, Asthma, Chronic Obstructive Pulmonary  Disease (COPD), Pneumothorax, Tuberculosis Cardiovascular Patient has history of Congestive Heart Failure - EF <20%, Hypertension Denies history of Angina, Arrhythmia, Coronary Artery Disease, Deep Vein Thrombosis, Hypotension, Myocardial Infarction, Peripheral Arterial Disease, Peripheral Venous Disease, Phlebitis, Vasculitis Gastrointestinal Denies history of Cirrhosis , Colitis, Crohns, Hepatitis A, Hepatitis B, Hepatitis C Endocrine Patient has history of Type II Diabetes - hgbA1c 11.2 Denies history of Type I  Diabetes Genitourinary Denies history of End Stage Renal Disease Immunological Denies history of Lupus Erythematosus, Raynauds, Scleroderma Musculoskeletal Patient has history of Osteomyelitis - chronic osteomyelitis 12/2022 Neurologic Patient has history of Neuropathy Denies history of Dementia, Quadriplegia, Paraplegia, Seizure Disorder Oncologic Denies history of Received Chemotherapy, Received Radiation Patient is treated with Insulin. Blood sugar is not tested. Dakota Ramirez, Dakota Ramirez ([id number]) [id number] Page 7 of 11 Hospitalization/Surgery History - back surgery. - 11/2021 heart cath. - 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. Review of Systems (ROS) Eyes Denies complaints or symptoms of Dry Eyes, Vision Changes, Glasses / Contacts. Ear/Nose/Mouth/Throat Denies complaints or symptoms of Chronic sinus problems or rhinitis. Respiratory Denies complaints or symptoms of Chronic or frequent coughs, Shortness of Breath. Gastrointestinal Denies complaints or symptoms of Frequent diarrhea, Nausea, Vomiting. Genitourinary Denies complaints or symptoms of Frequent urination. Integumentary (Skin) BL feet Neurologic Denies complaints or symptoms of Numbness/parasthesias. Psychiatric Denies complaints or symptoms of Claustrophobia. Objective Constitutional Sitting or standing Blood Pressure is within target range for patient.. Pulse regular and within target range for patient.Marland Kitchen Respirations regular, non-labored and within target range.. Temperature is normal and within the target range for the patient.Marland Kitchen Appears in no distress. Vitals Time Taken: 2:33 PM, Height: 71 in, Source: Stated, Weight: 254 lbs, Source: Stated, BMI: 35.4, Temperature: 97.8 F, Pulse: 111 bpm, Respiratory Rate: 18 breaths/min, Blood Pressure: 119/84 mmHg. Cardiovascular Pedal pulses are palpable. General Notes: Wound exam;  Left first toe plantar aspect. This is a deep probing wound that  eventually goes to bone. Completely necrotic surface I remove callus skin and subcutaneous tissue. Does clean up reasonably well.  He also has a large punched-out wound on the left plantar heel. I could not probe this to bone however it is precariously close this was also debrided of callus skin and necrotic subcutaneous tissue.  On the right TMA site medially he has a small superficial wound thick redundant callus and subcutaneous tissue which I removed with a #5 curette over a small part of the area Integumentary (Hair, Skin) Wound #1 status is Open. Original cause of wound was Gradually Appeared. The date acquired was: 01/11/2021. The wound is located on the Right,Plantar Amputation Site - Transmetatarsal. The wound measures 4cm length x 0.5cm width x 0.1cm depth; 1.571cm^2 area and 0.157cm^3 volume. There is Fat Layer (Subcutaneous Tissue) exposed. There is no tunneling or undermining noted. There is a medium amount of serosanguineous drainage noted. The wound margin is distinct with the outline attached to the wound base. There is large (67-100%) red granulation within the wound bed. There is a small (1-33%) amount of necrotic tissue within the wound bed including Adherent Slough. The periwound skin appearance exhibited: Callus. The periwound skin appearance did not exhibit: Crepitus, Excoriation, Induration, Rash, Scarring, Dry/Scaly, Maceration, Atrophie Blanche, Cyanosis, Ecchymosis, Hemosiderin Staining, Mottled, Pallor, Rubor, Erythema. Wound #2 status is Open. Original cause of wound was Gradually Appeared. The date acquired was: 01/11/2021. The wound is located on the Left Calcaneus. The wound measures 1.6cm length x 2.5cm width x 1cm depth; 3.142cm^2 area and 3.142cm^3 volume. There is Fat Layer (Subcutaneous Tissue) exposed.  There is no tunneling noted, however, there is undermining starting at 10:00 and ending at 1:00 with a maximum distance of 0.4cm. There is additional undermining and at  5:00 and ending at 11:00 with a maximum distance of 0.5cm. There is a medium amount of serosanguineous drainage noted. Foul odor after cleansing was noted. The wound margin is well defined and not attached to the wound base. There is large (67-100%) pink, pale granulation within the wound bed. There is a small (1-33%) amount of necrotic tissue within the wound bed including Adherent Slough. The periwound skin appearance exhibited: Callus. The periwound skin appearance did not exhibit: Crepitus, Excoriation, Induration, Rash, Scarring, Dry/Scaly, Maceration, Atrophie Blanche, Cyanosis, Ecchymosis, Hemosiderin Staining, Mottled, Pallor, Rubor, Erythema. Periwound temperature was noted as No Abnormality. Wound #3 status is Open. Original cause of wound was Gradually Appeared. The date acquired was: 11/12/2022. The wound is located on the Left,Plantar T oe Great. The wound measures 0.7cm length x 0.7cm width x 0.6cm depth; 0.385cm^2 area and 0.231cm^3 volume. There is bone and Fat Layer (Subcutaneous Tissue) exposed. There is no tunneling noted, however, there is undermining starting at 3:00 and ending at 9:00 with a maximum distance of 0.4cm. There is a medium amount of serosanguineous drainage noted. The wound margin is distinct with the outline attached to the wound base. There is medium (34-66%) red granulation within the wound bed. There is a medium (34-66%) amount of necrotic tissue within the wound bed including Adherent Slough. The periwound skin appearance exhibited: Callus. The periwound skin appearance did not exhibit: Crepitus, Excoriation, Induration, Rash, Scarring, Dry/Scaly, Maceration, Atrophie Blanche, Cyanosis, Ecchymosis, Hemosiderin Staining, Mottled, Pallor, Rubor, Erythema. 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 There was a Excisional Skin/Subcutaneous Tissue Debridement with a total area of 0.78 sq cm performed by Dakota Ramirez., MD. With the following instrument(s): Curette to remove Viable and Non-Viable tissue/material. Material removed includes Callus, Subcutaneous Tissue, Slough, Skin: Dermis, Skin: Epidermis, and Biofilm after achieving pain control using Lidocaine 4% Topical Solution. A time out was conducted at 16:05, prior to the start of the procedure. A Minimum amount of bleeding was controlled with Pressure. The procedure was tolerated well with a pain level of 0 throughout and a pain level of 0 following the procedure. Post Debridement Measurements: 4cm length x 0.5cm width x 0.1cm depth; 0.157cm^3 volume. Character of Wound/Ulcer Post Debridement requires further debridement. Severity of Tissue Post Debridement is: Fat layer exposed. 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 There was a Excisional Skin/Subcutaneous Tissue Debridement with a total area of 3.14 sq cm performed by Dakota Ramirez., MD. With the following instrument(s): Curette to remove Viable and Non-Viable tissue/material. Material removed includes Callus, Subcutaneous Tissue, Slough, Skin: Dermis, Skin: Epidermis, and Biofilm after achieving pain control using  Lidocaine  4% T opical Solution. A time out was conducted at 16:05, prior to the start of the procedure. A Minimum amount of bleeding was controlled with Pressure. The procedure was tolerated well with a pain level of 0 throughout and a pain level of 0 following the procedure. Post Debridement Measurements: 1.6cm length x 2.5cm width x 1cm depth; 3.142cm^3 volume. Character of Wound/Ulcer Post Debridement requires further debridement. Severity of Tissue Post Debridement is: Fat layer exposed. Post procedure Diagnosis Wound #2: Same as Pre-Procedure Wound #3 Pre-procedure diagnosis of Wound #3 is a Diabetic Wound/Ulcer of the Lower Extremity located on the Left,Plantar T Great .Severity of Tissue Pre oe Debridement is: Fat layer exposed. There was a Excisional Skin/Subcutaneous Tissue Debridement with a total area of 0.38 sq cm performed by Dakota Ramirez., MD. With the following instrument(s): Curette to remove Viable and Non-Viable tissue/material. Material removed includes Callus, Subcutaneous Tissue, Slough, Skin: Dermis, Skin: Epidermis, and Biofilm after achieving pain control using Lidocaine 4% Topical Solution. A time out was conducted at 16:05, prior to the start of the procedure. A Minimum amount of bleeding was controlled with Pressure. The procedure was tolerated well with a pain level of 0 throughout and a pain level of 0 following the procedure. Post Debridement Measurements: 0.7cm length x 0.7cm width x 0.1cm depth; 0.038cm^3 volume. Character of Wound/Ulcer Post Debridement requires further debridement. Severity of Tissue Post Debridement is: Fat layer exposed. Post procedure Diagnosis Wound #3: Same as Pre-Procedure Plan Follow-up Appointments: Return Appointment in 1 week. - Dr. Mikey Bussing 01/28/2023 1700 room 8 Monday Other: - Patient seeing Dr. Lajoyce Corners tomorrow for a consultation as well. Patient to call wound center if decide to go with Dr. Lajoyce Corners. Anesthetic: (In clinic) Topical  Lidocaine 4% applied to wound bed Off-Loading: Open toe surgical shoe to: - with Peg Assist to both surgical shoes. WOUND #1: - Amputation Site - Transmetatarsal Wound Laterality: Plantar, Right Cleanser: Soap and Water Every Other Day/15 Days Discharge Instructions: May shower and wash wound with dial antibacterial soap and water prior to dressing change. Prim Dressing: Maxorb Extra Ag+ Alginate Dressing, 4x4.75 (in/in) Every Other Day/15 Days ary Discharge Instructions: Apply to wound bed as instructed Secondary Dressing: ABD Pad, 8x10 Every Other Day/15 Days Discharge Instructions: Apply over primary dressing as directed. Secured With: [employer], 4.5x3.1 (in/yd) Every Other Day/15 Days Discharge Instructions: Secure with Kerlix as directed. Secured With: 16M Medipore H Soft Cloth Surgical T ape, 4 x 10 (in/yd) Every Other Day/15 Days Discharge Instructions: Secure with tape as directed. WOUND #2: - Calcaneus Wound Laterality: Left Cleanser: Soap and Water Every Other Day/15 Days Discharge Instructions: May shower and wash wound with dial antibacterial soap and water prior to dressing change. Prim Dressing: Maxorb Extra Ag+ Alginate Dressing, 4x4.75 (in/in) (DME) (Generic) Every Other Day/15 Days ary Discharge Instructions: Apply to wound bed as instructed Secondary Dressing: ABD Pad, 8x10 (DME) (Generic) Every Other Day/15 Days Discharge Instructions: Apply over primary dressing as directed. Secured With: [employer], 4.5x3.1 (in/yd) (DME) (Generic) Every Other Day/15 Days Discharge Instructions: Secure with Kerlix as directed. Secured With: 16M Medipore H Soft Cloth Surgical T ape, 4 x 10 (in/yd) (DME) (Generic) Every Other Day/15 Days Discharge Instructions: Secure with tape as directed. WOUND #3: - T Great Wound Laterality: Plantar, Left oe Cleanser: Soap and Water Every Other Day/15 Days Discharge Instructions: May shower and wash wound with dial antibacterial  soap and water prior to dressing change. Prim Dressing: Maxorb Extra Ag+ Alginate  Dressing, 4x4.75 (in/in) (DME) (Generic) Every Other Day/15 Days ary Discharge Instructions: Apply to wound bed as instructed Secondary Dressing: Woven Gauze Sponges 2x2 in (DME) (Generic) Every Other Day/15 Days Discharge Instructions: Apply over primary dressing as directed. Secured With: Insurance underwriter, Sterile 2x75 (in/in) (DME) (Generic) Every Other Day/15 Days Discharge Instructions: Secure with stretch gauze as directed. Secured With: 34M Medipore H Soft Cloth Surgical T ape, 4 x 10 (in/yd) (DME) (Generic) Every Other Day/15 Days Discharge Instructions: Secure with tape as directed. 1. 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 He is completing Augmentin and doxycycline. It would have been possible to get a Dakota Ramirez, Dakota Ramirez ([id number]) [id number] Page 9 of 11 bone biopsy or scraping of this but there is no point in doing it while on Augmentin and doxycycline. 2. Sees Dr. Lajoyce Corners tomorrow I am uncertain whether he is planning surgery question left first toe amputation 3. The area on the left heel is a deep wound precariously close to bone but did not have evidence of osteomyelitis on his recent MRI. 4. Thick callus indicative of an adequate pressure relief over the right TMA I removed some of this around the wound 5. He is not offloading his feet at all. We gave him bilateral Pegasys shoes 6. The patient is not a candidate for HBO based on his severe presumably ischemic cardiomyopathy and logistic issues related to living in [place] would likely be difficult. 7. How closely we follow this man really depends on what Dr. Lajoyce Corners is planning for him tomorrow. I did give him a follow-up appointment for 2 weeks at which time we can review all of this. 8. Silver alginate to all wounds Electronic Signature(s) Signed: 01/18/2023 7:57:57 AM By:  Dakota Najjar MD Entered By: Dakota Ramirez on 01/17/2023 16:53:12 -------------------------------------------------------------------------------- HxROS Details Patient Name: Date of Service: Dakota Ramirez 01/17/2023 2:30 PM Medical Record Number: [id number] Patient Account Number: [id number] Date of Birth/Sex: Treating RN: 05-09-1971 ([name]) Shawn Stall Primary Care Provider: Ishmael Ramirez EL Other Clinician: Referring Provider: Treating Provider/Extender: Dakota Ramirez, Dakota Ramirez in Treatment: 0 Information Obtained From Patient Eyes Complaints and Symptoms: Negative for: Dry Eyes; Vision Changes; Glasses / Contacts Medical History: Negative for: Cataracts; Glaucoma; Optic Neuritis Ear/Nose/Mouth/Throat Complaints and Symptoms: Negative for: Chronic sinus problems or rhinitis Medical History: Negative for: Chronic sinus problems/congestion; Middle ear problems Respiratory Complaints and Symptoms: Negative for: Chronic or frequent coughs; Shortness of Breath Medical History: Positive for: Sleep Apnea Negative for: Aspiration; Asthma; Chronic Obstructive Pulmonary Disease (COPD); Pneumothorax; Tuberculosis Gastrointestinal Complaints and Symptoms: Negative for: Frequent diarrhea; Nausea; Vomiting Medical History: Negative for: Cirrhosis ; Colitis; Crohns; Hepatitis A; Hepatitis B; Hepatitis C Genitourinary Complaints and Symptoms: Negative for: Frequent urination Medical History: Negative for: End Stage Renal Disease Neurologic Complaints and Symptoms: Negative for: Numbness/parasthesias TARAJ, YOFFE ([id number]) [id number] Page 10 of 11 Medical History: Positive for: Neuropathy Negative for: Dementia; Quadriplegia; Paraplegia; Seizure Disorder Psychiatric Complaints and Symptoms: Negative for: Claustrophobia Hematologic/Lymphatic Medical History: Negative for: Anemia; Hemophilia; Human Immunodeficiency Virus;  Lymphedema; Sickle Cell Disease Cardiovascular Medical History: Positive for: Congestive Heart Failure - EF <20%; Hypertension Negative for: Angina; Arrhythmia; Coronary Artery Disease; Deep Vein Thrombosis; Hypotension; Myocardial Infarction; Peripheral Arterial Disease; Peripheral Venous Disease; Phlebitis; Vasculitis Endocrine Medical History: Positive for: Type II Diabetes - hgbA1c 11.2 Negative for: Type I Diabetes Treated with: Insulin Blood sugar tested every day: No Immunological Medical History: Negative for: Lupus Erythematosus;  Raynauds; Scleroderma Integumentary (Skin) Complaints and Symptoms: Review of System Notes: BL feet Musculoskeletal Medical History: Positive for: Osteomyelitis - chronic osteomyelitis 12/2022 Oncologic Medical History: Negative for: Received Chemotherapy; Received Radiation Immunizations Pneumococcal Vaccine: Received Pneumococcal Vaccination: No Implantable Devices No devices added Hospitalization / Surgery History Type of Hospitalization/Surgery back surgery 11/2021 heart cath 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 Family and Social History Cancer: Yes - Father; Diabetes: Yes - Mother; Heart Disease: Yes - Mother; Hereditary Spherocytosis: No; Hypertension: Yes - Mother; Kidney Disease: No; Lung Disease: No; Seizures: No; Stroke: No; Thyroid Problems: Yes - Mother; Tuberculosis: No; Never smoker; Marital Status - Widowed; Alcohol Use: Never; Drug Use: No History; Caffeine Use: Daily; Financial Concerns: No; Food, Clothing or Shelter Needs: No; Support System Lacking: No; Transportation Concerns: No Psychologist, prison and probation services) Signed: 01/17/2023 6:21:23 PM By: Shawn Stall RN, BSN Signed: 01/18/2023 7:57:57 AM By: Dakota Najjar MD Signed: 01/21/2023 3:42:51 PM By: Thayer Dallas Entered By: Thayer Dallas on 01/17/2023 14:46:53 Dakota Ramirez ([id number]) [id number] Page 11 of  11 -------------------------------------------------------------------------------- SuperBill Details Patient Name: Date of Service: Dakota Ramirez, Dakota Ramirez 01/17/2023 Medical Record Number: [id number] Patient Account Number: [id number] Date of Birth/Sex: Treating RN: 12/03/1970 (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 L97.518 Non-pressure chronic ulcer of other part of right foot with other specified severity Facility Procedures : CPT4 Code: 78295621 Description: 99213 - WOUND CARE VISIT-LEV 3 EST PT Modifier: Quantity: 1 : CPT4 Code: 30865784 Description: 11042 - DEB SUBQ TISSUE 20 SQ CM/< ICD-10 Diagnosis Description L97.524 Non-pressure chronic ulcer of other part of left foot with necrosis of bone L97.423 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 Modifier: Quantity: 1 Physician Procedures : CPT4 Code Description Modifier [id number] 99204 - WC PHYS LEVEL 4 - NEW PT 25 ICD-10 Diagnosis Description E11.621 Type 2 diabetes mellitus with foot ulcer M86.672 Other chronic osteomyelitis, left ankle and foot L97.524 Non-pressure chronic ulcer of  other part of left foot with necrosis of bone L97.518 Non-pressure chronic ulcer of other part of right foot with other specified severity Quantity: 1 : [id number] 11042 - WC PHYS SUBQ TISS 20 SQ CM ICD-10 Diagnosis Description L97.524 Non-pressure chronic ulcer of other part of left foot with necrosis of bone L97.423 Non-pressure chronic ulcer of left heel and midfoot with  necrosis of muscle Quantity: 1 Electronic Signature(s) Signed: 01/24/2023 4:42:02 PM By: Shawn Stall RN, BSN Signed: 02/12/2023 3:35:02 PM By: Dakota Najjar MD Previous Signature: 01/18/2023 7:57:57 AM Version By: Dakota Najjar MD Entered By: Shawn Stall on 01/24/2023 16:42:01

## 2023-02-19 ENCOUNTER — Ambulatory Visit (INDEPENDENT_AMBULATORY_CARE_PROVIDER_SITE_OTHER): Payer: 59 | Admitting: Orthopedic Surgery

## 2023-02-19 DIAGNOSIS — Z89412 Acquired absence of left great toe: Secondary | ICD-10-CM

## 2023-02-19 DIAGNOSIS — S98112A Complete traumatic amputation of left great toe, initial encounter: Secondary | ICD-10-CM

## 2023-02-25 ENCOUNTER — Ambulatory Visit (HOSPITAL_COMMUNITY)
Admission: RE | Admit: 2023-02-25 | Discharge: 2023-02-25 | Disposition: A | Discharge status: Home / Self Care | Payer: 59 | Source: Ambulatory Visit | Attending: Family Medicine | Admitting: Family Medicine

## 2023-02-25 ENCOUNTER — Encounter (HOSPITAL_COMMUNITY): Payer: Self-pay

## 2023-02-25 ENCOUNTER — Other Ambulatory Visit (HOSPITAL_COMMUNITY): Payer: Self-pay

## 2023-02-25 ENCOUNTER — Encounter: Payer: 59 | Admitting: *Deleted

## 2023-02-25 VITALS — BP 102/68 | HR 103 | Wt 255.4 lb

## 2023-02-25 VITALS — Wt 255.6 lb

## 2023-02-25 DIAGNOSIS — I5022 Chronic systolic (congestive) heart failure: Secondary | ICD-10-CM | POA: Diagnosis not present

## 2023-02-25 DIAGNOSIS — Z139 Encounter for screening, unspecified: Secondary | ICD-10-CM | POA: Diagnosis not present

## 2023-02-25 DIAGNOSIS — Z8739 Personal history of other diseases of the musculoskeletal system and connective tissue: Secondary | ICD-10-CM | POA: Diagnosis not present

## 2023-02-25 DIAGNOSIS — Z794 Long term (current) use of insulin: Secondary | ICD-10-CM | POA: Diagnosis not present

## 2023-02-25 DIAGNOSIS — I10 Essential (primary) hypertension: Secondary | ICD-10-CM | POA: Diagnosis not present

## 2023-02-25 DIAGNOSIS — Z006 Encounter for examination for normal comparison and control in clinical research program: Secondary | ICD-10-CM

## 2023-02-25 DIAGNOSIS — Z8249 Family history of ischemic heart disease and other diseases of the circulatory system: Secondary | ICD-10-CM | POA: Insufficient documentation

## 2023-02-25 DIAGNOSIS — I5082 Biventricular heart failure: Secondary | ICD-10-CM | POA: Insufficient documentation

## 2023-02-25 DIAGNOSIS — E1169 Type 2 diabetes mellitus with other specified complication: Secondary | ICD-10-CM | POA: Diagnosis not present

## 2023-02-25 DIAGNOSIS — E1165 Type 2 diabetes mellitus with hyperglycemia: Secondary | ICD-10-CM | POA: Insufficient documentation

## 2023-02-25 DIAGNOSIS — E1159 Type 2 diabetes mellitus with other circulatory complications: Secondary | ICD-10-CM

## 2023-02-25 DIAGNOSIS — I428 Other cardiomyopathies: Secondary | ICD-10-CM | POA: Insufficient documentation

## 2023-02-25 DIAGNOSIS — I110 Hypertensive heart disease with heart failure: Secondary | ICD-10-CM | POA: Insufficient documentation

## 2023-02-25 DIAGNOSIS — Z79899 Other long term (current) drug therapy: Secondary | ICD-10-CM | POA: Insufficient documentation

## 2023-02-25 DIAGNOSIS — E8809 Other disorders of plasma-protein metabolism, not elsewhere classified: Secondary | ICD-10-CM | POA: Diagnosis not present

## 2023-02-25 LAB — BASIC METABOLIC PANEL
Anion gap: 10 (ref 5–15)
BUN: 10 mg/dL (ref 6–20)
CO2: 30 mmol/L (ref 22–32)
Calcium: 8 mg/dL — ABNORMAL LOW (ref 8.9–10.3)
Chloride: 96 mmol/L — ABNORMAL LOW (ref 98–111)
Creatinine, Ser: 0.9 mg/dL (ref 0.61–1.24)
GFR, Estimated: >60 mL/min (ref >60)
Glucose, Bld: 287 mg/dL — ABNORMAL HIGH (ref 70–99)
Potassium: 3.6 mmol/L (ref 3.5–5.1)
Sodium: 136 mmol/L (ref 135–145)

## 2023-02-25 LAB — DIGOXIN LEVEL: Digoxin Level: <0.2 ng/mL — ABNORMAL LOW (ref 0.8–2.0)

## 2023-02-25 LAB — BRAIN NATRIURETIC PEPTIDE: B Natriuretic Peptide: 1019.9 pg/mL — ABNORMAL HIGH (ref 0.0–100.0)

## 2023-02-25 MED ORDER — POTASSIUM CHLORIDE CRYS ER 20 MEQ PO TBCR: 40.0000 meq | EXTENDED_RELEASE_TABLET | Freq: Every day | ORAL | 5 refills | Status: DC

## 2023-02-25 NOTE — Research (Signed)
Patient saw NP in HF clinic for follow up. Thank patient for participating in the registry.   Mercer Pod :) RN BSN  Clinical Research Nurse  Be strong and take heart, all you who hope in the [place]. ~ Psalm 31:24

## 2023-02-25 NOTE — Patient Instructions (Addendum)
Labs done today. We will contact you only if your labs are abnormal.  START Potassium 47meq(2 tablets) by mouth daily.  No other medication changes were made. Please continue all current medications as prescribed.  Your physician recommends that you keep your scheduled follow-up appointment in September.  If you have any questions or concerns before your next appointment please send [place] a message through [place] or call our office at [phone].    TO LEAVE A MESSAGE FOR THE NURSE SELECT OPTION 2, PLEASE LEAVE A MESSAGE INCLUDING: YOUR NAME DATE OF BIRTH CALL BACK NUMBER REASON FOR CALL**this is important as we prioritize the call backs  YOU WILL RECEIVE A CALL BACK THE SAME DAY AS LONG AS YOU CALL BEFORE 4:00 PM   Do the following things EVERYDAY: Weigh yourself in the morning before breakfast. Write it down and keep it in a log. Take your medicines as prescribed Eat low salt foods--Limit salt (sodium) to 2000 mg per day.  Stay as active as you can everyday Limit all fluids for the day to less than 2 liters   At [facility], you and your health needs are our priority. As part of our continuing mission to provide you with exceptional heart care, we have created designated Provider Care Teams. These Care Teams include your primary Cardiologist (physician) and Advanced Practice Providers (APPs- Physician Assistants and Nurse Practitioners) who all work together to provide you with the care you need, when you need it.   You may see any of the following providers on your designated Care Team at your next follow up: Dr Arvilla Meres Dr Marca Ancona Dr. Marcos Eke, NP Robbie Lis, [place] [facility] [place], [place] Brynda Peon, NP Karle Plumber, PharmD   Please be sure to bring in all your medications bottles to every appointment.    Thank you for choosing [facility]

## 2023-02-25 NOTE — Progress Notes (Signed)
ADVANCED HF CLINIC NOTE   Primary Care: [facility] Dept. HF Cardiologist: Dr. Gala Romney  HPI: Dakota Ramirez is a 52 y.o. 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    Admitted 4/23 with new-onset acute HF.  Given IV lasix. Echo EF 20-25%, regional WMA, moderately dilated LV, RV moderately reduced, RVSP 40 mmHg, mild to moderate MR, dilated IVC with estimated RAP 15 mmHg. Cards consulted and GDMT started. 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  Overall diuresed 50 lbs. No SGLT2i with uncontrolled DM. Discharged home, weight 259 lbs.   Post hospital follow up, NYHA II, volume OK. Entresto increased and paramedicine arranged to help with med compliance.  Clinic visit 9/23 was NYHA II and volume OK. Missed follow up echo. Bedside echo 9/23 showed EF 20-25%.  cMRI 4/24 showed severe biventricular failure, LVEF 17%. RVEF 22% LGE imaging very poor quality, which limits interpretation. 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   Follow up 12/26/22, NYHA IIIb, markedly volume overload weight up 40 lbs, ReDs 42%. Advised to go to ED for evaluation, where he was admitted. Diuresed with IV lasix. Echo showed EF <20%, LV with GHK, RV mildly reduced, LA/RA sev dilated, mild-mod MR, mild TR, trivial pericardial effusion. GDMT titrated. Dr Lajoyce Corners consulted for chronic osteomyelitis and Unna boot placed on RLE, no indication for surgical intervention. He was discharged home, weight 251 lbs.  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  Today he returns for HF follow up. Overall feeling fine. He has SOB walking further distances on flat ground, more so in the heat. Struggles with balance issues since his toe and TMA amputations, walks with a walking stick. Denies palpitations, CP, dizziness, edema, or  PND/Orthopnea. Appetite ok. No fever or chills. Weight at home 250 pounds. Taking Entresto once daily, off KCL x 1 week.  Enjoys fishing & has a Writer, [address].   Cardiac Studies: - Echo (5/24): EF < 20%, LV with GHK, RV mildly reduced, LA/RA sev dilated, mild-mod MR, mild TR, trivial pericardial effusion.   - Bedside echo (9/23): EF 20-25%  - Echo (4/23): EF 20-25%, regional WMA, moderately dilated LV, RV moderately reduced, RVSP 40 mmHg, mild to moderate MR, dilated IVC with estimated RAP 15 mmHg.  - R/LHC (4/23):    The left ventricular ejection fraction is less than 25% by visual estimate.  Ao = 97/67 (85) LV = 105/25 RA = 8 RV = 37/14 PA = 35/22 (25) PCW = 20 Fick cardiac output/index = 5.9/2.4 PVR = 0.8 WU SVR = 1040 Ao sat = 97% PA sat = 66%, 67%   1. Normal coronary arteries 2. 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 Mildly elevated filling pressures with normal cardiac output  cMRI (4/24): poor quality, LVEF 17%, RVEF 22%,  appears to have RV insertion site LGE (which is a nonspecific scar finding often seen in setting of elevated pulmonary pressures) and basal septal midwall LGE (which is a scar pattern seen in NICM and associated with worse prognosis)   Past Medical History:  Diagnosis Date   CHF (congestive heart failure) (HCC)    Diabetes mellitus without complication (HCC)    Type II   Heart failure with reduced ejection fraction (HCC) 11/26/2021   Hypertension    Current Outpatient Medications  Medication Sig Dispense Refill   Blood  Glucose Monitoring Suppl (BLOOD GLUCOSE MONITOR SYSTEM) w/Device KIT Use to check blood sugars 4 times daily. 1 kit 0   carvedilol (COREG) 6.25 MG tablet Take 1 tablet (6.25 mg total) by mouth 2 (two) times daily. 180 tablet 3   digoxin (LANOXIN) 0.125 MG tablet Take 1 tablet (0.125 mg total) by mouth daily. 30 tablet 3   furosemide (LASIX) 40 MG tablet Take 1.5 tablets (60 mg total) by mouth daily. 45 tablet 5   NOVOLOG MIX 70/30  FLEXPEN (70-30) 100 UNIT/ML FlexPen INJECT 25 UNITS SUBCUTANEOUSLY TWICE DAILY WITH A MEAL 15 mL 0   oxyCODONE-acetaminophen (PERCOCET/ROXICET) 5-325 MG tablet Take 1 tablet by mouth every 4 (four) hours as needed. 30 tablet 0   sacubitril-valsartan (ENTRESTO) 97-103 MG Take 1 tablet by mouth 2 (two) times daily. (Patient taking differently: Take 1 tablet by mouth daily.) 60 tablet 3   spironolactone (ALDACTONE) 25 MG tablet Take 1 tablet (25 mg total) by mouth daily. 30 tablet 3   Continuous Glucose Receiver (DEXCOM G7 RECEIVER) DEVI Place new sensor every 10 days. Use to monitor your blood sugar continuously. (Patient not taking: Reported on 02/25/2023) 1 each 1   Continuous Glucose Sensor (DEXCOM G7 SENSOR) MISC Place new sensor every 10 days. Use to monitor your blood sugar continuously. (Patient not taking: Reported on 02/25/2023) 9 each 3   potassium chloride SA (KLOR-CON M) 20 MEQ tablet Take 1 tablet (20 mEq total) by mouth 2 (two) times daily. (Patient not taking: Reported on 02/25/2023) 120 tablet 8   No current facility-administered medications for this encounter.   No Known Allergies  Social History   Socioeconomic History   Marital status: Single    Spouse name: Not on file   Number of children: Not on file   Years of education: Not on file   Highest education level: Not on file  Occupational History   Not on file  Tobacco Use   Smoking status: Never   Smokeless tobacco: Never  Vaping Use   Vaping status: Never Used  Substance and Sexual Activity   Alcohol use: Not Currently    Comment: rare   Drug use: Never   Sexual activity: Not on file  Other Topics Concern   Not on file  Social History Narrative   Not on file   Social Determinants of Health   Financial Resource Strain: High Risk (01/10/2023)   Overall Financial Resource Strain (CARDIA)    Difficulty of Paying Living Expenses: Hard  Food Insecurity: Food Insecurity Present (12/28/2022)   Hunger Vital Sign     Worried About Running Out of Food in the Last Year: Often true    Ran Out of Food in the Last Year: Often true  Transportation Needs: No Transportation Needs (11/29/2021)   PRAPARE - Administrator, Civil Service (Medical): No    Lack of Transportation (Non-Medical): No  Physical Activity: Not on file  Stress: No Stress Concern Present (02/28/2021)   Received from [facility] of Occupational [facility] - Occupational Stress Questionnaire    Feeling of Stress : Not at all  Social Connections: Unknown (12/15/2021)   Received from [facility]   Social Network    Social Network: Not on file  Intimate Partner Violence: Unknown (11/14/2021)   Received from [facility]   HITS    Physically Hurt: Not on file    Insult or Talk Down To: Not on file    Threaten Physical Harm: Not  on file    Scream or Curse: Not on file   Family History  Problem Relation Age of Onset   Diabetes Mother    Hypertension Mother    BP 102/68   Pulse (!) 103   Wt 115.8 kg (255 lb 6.4 oz)   SpO2 99%   BMI 35.62 kg/m   Wt Readings from Last 3 Encounters:  02/25/23 115.8 kg (255 lb 6.4 oz)  01/25/23 113.9 kg (251 lb)  01/17/23 115.2 kg (254 lb)   PHYSICAL EXAM: General:  NAD. No resp difficulty, walked into clinic with cane HEENT: Normal Neck: Supple. No JVD. Carotids 2+ bilat; no bruits. No lymphadenopathy or thryomegaly appreciated. Cor: PMI nondisplaced. Regular rate & rhythm. No rubs, gallops or murmurs. Lungs: Clear Abdomen: Soft, nontender, nondistended. No hepatosplenomegaly. No bruits or masses. Good bowel sounds. Extremities: No cyanosis, clubbing, rash, 1+ BLE pre-tibial edema; s/p R TMA Neuro: Alert & oriented x 3, cranial nerves grossly intact. Moves all 4 extremities w/o difficulty. Affect pleasant.  ASSESSMENT & PLAN:  Chronic Systolic Heart Failure - NICM. Reports hx CHF in his mother. No hx alcohol or drug abuse. 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 RVEF 22% LGE imaging very poor quality, which limits interpretation. Appears to have RV insertion site LGE (associated with worse prognosis) - Echo (5/24): EF < 20%, LV with GHK, RV mildly reduced, LA/RA sev dilated, mild-mod MR, mild TR, trivial pericardial effusion.  - NYHA II-early III, volume OK today.  - Continue Coreg 6.25 mg bid - Continue digoxin 0.125 mg daily. - Continue Lasix 60 mg daily. Discussed taking extra PRN, restart 40 KCL daily. - Continue Entresto 97/103 mg bid. Discussed taking BID  - Continue spiro 25 mg daily. - No SGLT2i with uncontrolled DM and concern for hygiene. - Not candidate for ICD with ongoing LE wounds.  - Labs today.  2. Uncontrolled DM2 - Most recent A1C 11.2% - He is on insulin - Per PCP   3. Hx osteomyelitis s/p right TMA - 2/2 poorly controlled DM   4. HTN - BP controlled - GDMT as above   5. Hypoalbuminemia - Liver US with echogenic parenchyma, ? Hepatic steatosis or fibrosis. Cannot exclude cirrhosis. - hepC antibody NR  -? Secondary to RV failure   6. SDOH  - Now insured. - Lives in a camper behind father's house. Unemployed and has limited social support. - Needs Paramedicine but he has refulsed, HFSW helping with disability/food stamps.  Keep follow up in 2 months with Dr. Gala Romney. He is high-risk for re-admit.  Anderson [place], FNP-BC  02/25/23 2:00 PM

## 2023-02-26 ENCOUNTER — Telehealth (HOSPITAL_COMMUNITY): Payer: Self-pay

## 2023-02-26 DIAGNOSIS — I5022 Chronic systolic (congestive) heart failure: Secondary | ICD-10-CM

## 2023-02-26 MED ORDER — FUROSEMIDE 40 MG PO TABS
80.0000 mg | ORAL_TABLET | Freq: Every day | ORAL | 11 refills | Status: DC
Start: 2023-02-26 — End: 2023-07-01

## 2023-02-26 NOTE — Telephone Encounter (Signed)
Called patient and he stated he has not been taking his dig rx regularly and will start taking it as directed and will also start taking his kcl that was started at his visit. Patient lasix has been increased and his med list has been updated to reflect this change. In addition, patient's labs has been ordered and appointment scheduled. Pt aware, agreeable, and verbalized understanding.

## 2023-02-26 NOTE — Telephone Encounter (Signed)
-----   Message from Jacklynn Ganong sent at 02/25/2023  4:10 PM EDT ----- Digoxin level < 0.2, please call and ensure he is taking this regularly.  BNP elevated, suggesting volume overload.  Please increase Lasix to 80 mg daily. We restarted his KCL 40 daily at his visit today. Repeat BMET in 10-14 days

## 2023-03-01 ENCOUNTER — Encounter: Payer: Self-pay | Admitting: Orthopedic Surgery

## 2023-03-01 NOTE — Progress Notes (Signed)
Office Visit Note   Patient: Dakota Ramirez           Date of Birth: 1970/10/25           MRN: [id number] Visit Date: 02/19/2023              Requested by: Annett Fabian, MD [address] [place],  [place] [address] PCP: Annett Fabian, MD  Chief Complaint  Patient presents with   Left Foot - Routine Post Op    01/25/2023 left Great toe amputation and heel debridement          HPI: 3 weeks s/p left great toe amputation  Assessment & Plan: Visit Diagnoses:  1. Amputated great toe of left foot (HCC)     Plan: sutures removed, ulcer on heel debrided  Follow-Up Instructions: Return in about 4 weeks (around 03/19/2023).   Ortho Exam  Patient is alert, oriented, no adenopathy, well-dressed, normal affect, normal respiratory effort. Incision healed, heel ulcer 2 cm diameter after debridement, good DP pulse  Imaging: No results found. No images are attached to the encounter.  Labs: Lab Results  Component Value Date   HGBA1C 11.2 (H) 12/26/2022   HGBA1C 12.4 (H) 11/26/2021   ESRSEDRATE 68 (H) 12/28/2022   CRP 5.6 (H) 12/28/2022     Lab Results  Component Value Date   ALBUMIN 1.8 (L) 12/26/2022   ALBUMIN 2.5 (L) 11/29/2021   ALBUMIN 2.8 (L) 11/26/2021    Lab Results  Component Value Date   MG 1.4 (L) 12/29/2022   MG 1.8 12/28/2022   MG 1.2 (L) 12/27/2022   No results found for: "VD25OH"  No results found for: "PREALBUMIN"    Latest Ref Rng & Units 01/25/2023    8:34 AM 12/26/2022    2:19 PM 12/26/2022   11:32 AM  CBC EXTENDED  WBC 4.0 - 10.5 K/uL 8.0  5.8  5.8   RBC 4.22 - 5.81 MIL/uL 5.50  4.64  4.99   Hemoglobin 13.0 - 17.0 g/dL 08.6  57.8  46.9   HCT 39.0 - 52.0 % 46.7  41.7  44.2   Platelets 150 - 400 K/uL 212  247  238      There is no height or weight on file to calculate BMI.  Orders:  No orders of the defined types were placed in this encounter.  No orders of the defined types were placed in this encounter.    Procedures: No procedures  performed  Clinical Data: No additional findings.  ROS:  All other systems negative, except as noted in the HPI. Review of Systems  Objective: Vital Signs: There were no vitals taken for this visit.  Specialty Comments:  No specialty comments available.  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) 12/28/2022   Severe protein-calorie malnutrition (HCC) 12/28/2022   Elevated alkaline phosphatase level 12/27/2022   Acute on chronic heart failure (HCC) 12/26/2022   Hyponatremia 11/29/2021   PVD (peripheral vascular disease) (HCC) 11/29/2021   Class 2 obesity 11/29/2021   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) 11/26/2021   Type 2 diabetes mellitus with foot ulcer, with long-term current use of insulin (HCC) 11/26/2021   Essential  hypertension 11/26/2021   Past Medical History:  Diagnosis Date   CHF (congestive heart failure) (HCC)    Diabetes mellitus without complication (HCC)    Type II   Heart failure with reduced ejection fraction (HCC) 11/26/2021   Hypertension     Family History  Problem Relation Age of Onset   Diabetes Mother    Hypertension Mother     Past Surgical History:  Procedure Laterality Date   AMPUTATION Left 01/25/2023   Procedure: LEFT GREAT TOE AMPUTATION AND DEBRIDEMENT OF HEEL;  Surgeon: Nadara Mustard, MD;  Location: [facility] OR;  Service: Orthopedics;  Laterality: Left;   BACK SURGERY     CHOLECYSTECTOMY     RIGHT/LEFT HEART CATH AND CORONARY ANGIOGRAPHY N/A 11/30/2021   Procedure: RIGHT/LEFT HEART CATH AND CORONARY ANGIOGRAPHY;  Surgeon: Dolores Patty, MD;  Location: [facility] INVASIVE CV LAB;  Service: Cardiovascular;  Laterality: N/A;    TOE AMPUTATION Right    all toes on RT foot   Social History   Occupational History   Not on file  Tobacco Use   Smoking status: Never   Smokeless tobacco: Never  Vaping Use   Vaping status: Never Used  Substance and Sexual Activity   Alcohol use: Not Currently    Comment: rare   Drug use: Never   Sexual activity: Not on file

## 2023-03-05 ENCOUNTER — Other Ambulatory Visit: Payer: Self-pay | Admitting: Student

## 2023-03-08 ENCOUNTER — Ambulatory Visit (HOSPITAL_COMMUNITY)
Admission: RE | Admit: 2023-03-08 | Discharge: 2023-03-08 | Disposition: A | Discharge status: Home / Self Care | Payer: 59 | Source: Ambulatory Visit | Attending: Cardiology | Admitting: Cardiology

## 2023-03-08 DIAGNOSIS — I5022 Chronic systolic (congestive) heart failure: Secondary | ICD-10-CM | POA: Insufficient documentation

## 2023-03-08 LAB — BASIC METABOLIC PANEL
Anion gap: 9 (ref 5–15)
BUN: 12 mg/dL (ref 6–20)
CO2: 30 mmol/L (ref 22–32)
Calcium: 8.9 mg/dL (ref 8.9–10.3)
Chloride: 94 mmol/L — ABNORMAL LOW (ref 98–111)
Creatinine, Ser: 0.84 mg/dL (ref 0.61–1.24)
GFR, Estimated: >60 mL/min (ref >60)
Glucose, Bld: 341 mg/dL — ABNORMAL HIGH (ref 70–99)
Potassium: 4.2 mmol/L (ref 3.5–5.1)
Sodium: 133 mmol/L — ABNORMAL LOW (ref 135–145)

## 2023-03-19 ENCOUNTER — Ambulatory Visit (INDEPENDENT_AMBULATORY_CARE_PROVIDER_SITE_OTHER): Payer: 59 | Admitting: Orthopedic Surgery

## 2023-03-19 DIAGNOSIS — Z89412 Acquired absence of left great toe: Secondary | ICD-10-CM

## 2023-03-19 DIAGNOSIS — S98112A Complete traumatic amputation of left great toe, initial encounter: Secondary | ICD-10-CM | POA: Diagnosis not present

## 2023-03-21 ENCOUNTER — Encounter: Payer: Self-pay | Admitting: Orthopedic Surgery

## 2023-03-21 NOTE — Progress Notes (Signed)
Office Visit Note   Patient: PRANITH VEEDER           Date of Birth: 1971/07/23           MRN: [id number] Visit Date: 03/19/2023              Requested by: Annett Fabian, MD [address] [place],  [place] [address] PCP: Annett Fabian, MD  Chief Complaint  Patient presents with   Left Foot - Routine Post Op    01/25/2023 left GT amputation and left heel debridement       HPI: Patient is 2 months status post left great toe amputation and left heel debridement.  Assessment & Plan: Visit Diagnoses:  1. Amputated great toe of left foot (HCC)     Plan: Repeat debridement of the left heel.  Patient was placed in a postoperative shoe with felt relieving donut.  Follow-Up Instructions: Return in about 4 weeks (around 04/16/2023).   Ortho Exam  Patient is alert, oriented, no adenopathy, well-dressed, normal affect, normal respiratory effort. Examination the great toe amputation is healed well.  There is persistent ulceration of the left heel.  After informed consent a 10 blade knife was used to debride the skin and soft tissue back to healthy viable tissue.  After debridement the ulcer is 3 cm in diameter 5 mm deep with healthy granulation tissue without tunneling.  Imaging: No results found. No images are attached to the encounter.  Labs: Lab Results  Component Value Date   HGBA1C 11.2 (H) 12/26/2022   HGBA1C 12.4 (H) 11/26/2021   ESRSEDRATE 68 (H) 12/28/2022   CRP 5.6 (H) 12/28/2022     Lab Results  Component Value Date   ALBUMIN 1.8 (L) 12/26/2022   ALBUMIN 2.5 (L) 11/29/2021   ALBUMIN 2.8 (L) 11/26/2021    Lab Results  Component Value Date   MG 1.4 (L) 12/29/2022   MG 1.8 12/28/2022   MG 1.2 (L) 12/27/2022   No results found for: "VD25OH"  No results found for: "PREALBUMIN"    Latest Ref Rng & Units 01/25/2023    8:34 AM 12/26/2022    2:19 PM 12/26/2022   11:32 AM  CBC EXTENDED  WBC 4.0 - 10.5 K/uL 8.0  5.8  5.8   RBC 4.22 - 5.81 MIL/uL 5.50  4.64  4.99    Hemoglobin 13.0 - 17.0 g/dL 78.4  69.6  29.5   HCT 39.0 - 52.0 % 46.7  41.7  44.2   Platelets 150 - 400 K/uL 212  247  238      There is no height or weight on file to calculate BMI.  Orders:  No orders of the defined types were placed in this encounter.  No orders of the defined types were placed in this encounter.    Procedures: No procedures performed  Clinical Data: No additional findings.  ROS:  All other systems negative, except as noted in the HPI. Review of Systems  Objective: Vital Signs: There were no vitals taken for this visit.  Specialty Comments:  No specialty comments available.  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) 12/28/2022   Severe protein-calorie malnutrition (HCC) 12/28/2022   Elevated alkaline phosphatase  level 12/27/2022   Acute on chronic heart failure (HCC) 12/26/2022   Hyponatremia 11/29/2021   PVD (peripheral vascular disease) (HCC) 11/29/2021   Class 2 obesity 11/29/2021   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) 11/26/2021   Type 2 diabetes mellitus with foot ulcer, with long-term current use of insulin (HCC) 11/26/2021   Essential hypertension 11/26/2021   Past Medical History:  Diagnosis Date   CHF (congestive heart failure) (HCC)    Diabetes mellitus without complication (HCC)    Type II   Heart failure with reduced ejection fraction (HCC) 11/26/2021   Hypertension     Family History  Problem Relation Age of Onset   Diabetes Mother    Hypertension Mother     Past Surgical History:  Procedure Laterality Date   AMPUTATION Left 01/25/2023   Procedure: LEFT GREAT TOE AMPUTATION AND DEBRIDEMENT OF HEEL;   Surgeon: Nadara Mustard, MD;  Location: [facility] OR;  Service: Orthopedics;  Laterality: Left;   BACK SURGERY     CHOLECYSTECTOMY     RIGHT/LEFT HEART CATH AND CORONARY ANGIOGRAPHY N/A 11/30/2021   Procedure: RIGHT/LEFT HEART CATH AND CORONARY ANGIOGRAPHY;  Surgeon: Dolores Patty, MD;  Location: [facility] INVASIVE CV LAB;  Service: Cardiovascular;  Laterality: N/A;   TOE AMPUTATION Right    all toes on RT foot   Social History   Occupational History   Not on file  Tobacco Use   Smoking status: Never   Smokeless tobacco: Never  Vaping Use   Vaping status: Never Used  Substance and Sexual Activity   Alcohol use: Not Currently    Comment: rare   Drug use: Never   Sexual activity: Not on file

## 2023-03-22 DIAGNOSIS — Z8249 Family history of ischemic heart disease and other diseases of the circulatory system: Secondary | ICD-10-CM | POA: Diagnosis not present

## 2023-03-22 DIAGNOSIS — Z89431 Acquired absence of right foot: Secondary | ICD-10-CM | POA: Diagnosis not present

## 2023-03-22 DIAGNOSIS — E1151 Type 2 diabetes mellitus with diabetic peripheral angiopathy without gangrene: Secondary | ICD-10-CM | POA: Diagnosis not present

## 2023-03-22 DIAGNOSIS — Z794 Long term (current) use of insulin: Secondary | ICD-10-CM | POA: Diagnosis not present

## 2023-03-22 DIAGNOSIS — I509 Heart failure, unspecified: Secondary | ICD-10-CM | POA: Diagnosis not present

## 2023-03-22 DIAGNOSIS — E1142 Type 2 diabetes mellitus with diabetic polyneuropathy: Secondary | ICD-10-CM | POA: Diagnosis not present

## 2023-03-22 DIAGNOSIS — Z833 Family history of diabetes mellitus: Secondary | ICD-10-CM | POA: Diagnosis not present

## 2023-03-22 DIAGNOSIS — Z89422 Acquired absence of other left toe(s): Secondary | ICD-10-CM | POA: Diagnosis not present

## 2023-03-22 DIAGNOSIS — I110 Hypertensive heart disease with heart failure: Secondary | ICD-10-CM | POA: Diagnosis not present

## 2023-03-22 DIAGNOSIS — L97901 Non-pressure chronic ulcer of unspecified part of unspecified lower leg limited to breakdown of skin: Secondary | ICD-10-CM | POA: Diagnosis not present

## 2023-03-22 DIAGNOSIS — L97501 Non-pressure chronic ulcer of other part of unspecified foot limited to breakdown of skin: Secondary | ICD-10-CM | POA: Diagnosis not present

## 2023-03-22 DIAGNOSIS — Z809 Family history of malignant neoplasm, unspecified: Secondary | ICD-10-CM | POA: Diagnosis not present

## 2023-04-02 ENCOUNTER — Ambulatory Visit (INDEPENDENT_AMBULATORY_CARE_PROVIDER_SITE_OTHER): Payer: 59 | Admitting: Orthopedic Surgery

## 2023-04-02 DIAGNOSIS — L89623 Pressure ulcer of left heel, stage 3: Secondary | ICD-10-CM | POA: Diagnosis not present

## 2023-04-02 DIAGNOSIS — S98112A Complete traumatic amputation of left great toe, initial encounter: Secondary | ICD-10-CM

## 2023-04-02 DIAGNOSIS — Z89411 Acquired absence of right great toe: Secondary | ICD-10-CM

## 2023-04-12 ENCOUNTER — Encounter: Payer: Self-pay | Admitting: Orthopedic Surgery

## 2023-04-12 NOTE — Progress Notes (Signed)
Office Visit Note   Patient: Dakota Ramirez           Date of Birth: 05-23-1971           MRN: [id number] Visit Date: 04/02/2023              Requested by: Annett Fabian, MD [address] [place],  [place] [address] PCP: Annett Fabian, MD  Chief Complaint  Patient presents with   Left Foot - Routine Post Op    01/25/2023 left GT amputation and left heel debridement       HPI: Patient is a 52 year old gentleman who presents with 2 separate issues.  #1 he is 2 months status post left great toe amputation and follow-up for decubitus ulcer left heel.  Assessment & Plan: Visit Diagnoses:  1. Amputated great toe of left foot (HCC)   2. Decubitus ulcer of left heel, stage 3 (HCC)     Plan: A new felt relieving pad was applied to unload pressure from the heel.  Patient will continue with routine wound care.  Follow-Up Instructions: Return in about 2 weeks (around 04/16/2023).   Ortho Exam  Patient is alert, oriented, no adenopathy, well-dressed, normal affect, normal respiratory effort. Examination patient has a persistent decubitus left heel ulcer.  After informed consent a 10 blade knife was used to debride the skin and soft tissue back to healthy viable granulation tissue.  After debridement the ulcer is 3 cm in diameter.  The felt relieving pad is completely compressed and patient was provided a new felt relieving donut.  The amputated left great toe is well-healed.  Imaging: No results found. No images are attached to the encounter.  Labs: Lab Results  Component Value Date   HGBA1C 11.2 (H) 12/26/2022   HGBA1C 12.4 (H) 11/26/2021   ESRSEDRATE 68 (H) 12/28/2022   CRP 5.6 (H) 12/28/2022     Lab Results  Component Value Date   ALBUMIN 1.8 (L) 12/26/2022   ALBUMIN 2.5 (L) 11/29/2021   ALBUMIN 2.8 (L) 11/26/2021    Lab Results  Component Value Date   MG 1.4 (L) 12/29/2022   MG 1.8 12/28/2022   MG 1.2 (L) 12/27/2022   No results found for: "VD25OH"  No results  found for: "PREALBUMIN"    Latest Ref Rng & Units 01/25/2023    8:34 AM 12/26/2022    2:19 PM 12/26/2022   11:32 AM  CBC EXTENDED  WBC 4.0 - 10.5 K/uL 8.0  5.8  5.8   RBC 4.22 - 5.81 MIL/uL 5.50  4.64  4.99   Hemoglobin 13.0 - 17.0 g/dL 84.1  32.4  40.1   HCT 39.0 - 52.0 % 46.7  41.7  44.2   Platelets 150 - 400 K/uL 212  247  238      There is no height or weight on file to calculate BMI.  Orders:  No orders of the defined types were placed in this encounter.  No orders of the defined types were placed in this encounter.    Procedures: No procedures performed  Clinical Data: No additional findings.  ROS:  All other systems negative, except as noted in the HPI. Review of Systems  Objective: Vital Signs: There were no vitals taken for this visit.  Specialty Comments:  No specialty comments available.  PMFS History: Patient Active Problem List   Diagnosis Date Noted   Osteomyelitis of great toe of right foot (HCC) 01/25/2023   Edema of right lower leg due to  peripheral venous insufficiency 01/10/2023   Diabetic osteomyelitis (HCC) 12/28/2022   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 to breakdown of skin (HCC) 12/28/2022   Severe protein-calorie malnutrition (HCC) 12/28/2022   Elevated alkaline phosphatase level 12/27/2022   Acute on chronic heart failure (HCC) 12/26/2022   Hyponatremia 11/29/2021   PVD (peripheral vascular disease) (HCC) 11/29/2021   Class 2 obesity 11/29/2021   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) 11/26/2021   Type 2 diabetes mellitus with foot ulcer, with long-term current use of insulin (HCC) 11/26/2021   Essential hypertension 11/26/2021   Past Medical History:  Diagnosis Date   CHF (congestive heart failure) (HCC)    Diabetes mellitus without complication (HCC)    Type II   Heart failure with reduced ejection fraction (HCC) 11/26/2021   Hypertension     Family History  Problem  Relation Age of Onset   Diabetes Mother    Hypertension Mother     Past Surgical History:  Procedure Laterality Date   AMPUTATION Left 01/25/2023   Procedure: LEFT GREAT TOE AMPUTATION AND DEBRIDEMENT OF HEEL;  Surgeon: Nadara Mustard, MD;  Location: [facility] OR;  Service: Orthopedics;  Laterality: Left;   BACK SURGERY     CHOLECYSTECTOMY     RIGHT/LEFT HEART CATH AND CORONARY ANGIOGRAPHY N/A 11/30/2021   Procedure: RIGHT/LEFT HEART CATH AND CORONARY ANGIOGRAPHY;  Surgeon: Dolores Patty, MD;  Location: [facility];  Service: Cardiovascular;  Laterality: N/A;   TOE AMPUTATION Right    all toes on RT foot   Social History   Occupational History   Not on file  Tobacco Use   Smoking status: Never   Smokeless tobacco: Never  Vaping Use   Vaping status: Never Used  Substance and Sexual Activity   Alcohol use: Not Currently    Comment: rare   Drug use: Never   Sexual activity: Not on file

## 2023-04-14 DIAGNOSIS — Z419 Encounter for procedure for purposes other than remedying health state, unspecified: Secondary | ICD-10-CM | POA: Diagnosis not present

## 2023-04-21 NOTE — Progress Notes (Signed)
ADVANCED HF CLINIC NOTE   Primary Care: [facility] Dept. HF Cardiologist: Dr. Gala Romney  HPI: Dakota Ramirez is a 52 y.o. 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    Admitted 4/23 with new-onset acute HF.  Given IV lasix. Echo EF 20-25%, regional WMA, moderately dilated LV, RV moderately reduced, RVSP 40 mmHg, mild to moderate MR, dilated IVC with estimated RAP 15 mmHg. Cards consulted and GDMT started. 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  Overall diuresed 50 lbs. No SGLT2i with uncontrolled DM. Discharged home, weight 259 lbs.   cMRI 4/24 showed severe biventricular failure, LVEF 17%. RVEF 22% LGE imaging very poor quality, which limits interpretation. 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   Follow up 12/26/22, NYHA IIIb, markedly volume overload weight up 40 lbs, ReDs 42%. Advised to go to ED for evaluation, where he was admitted. Diuresed with IV lasix. Echo showed EF <20%, LV with GHK, RV mildly reduced, LA/RA sev dilated, mild-mod MR, mild TR, trivial pericardial effusion. GDMT titrated. Dr Lajoyce Corners consulted for chronic osteomyelitis and Unna boot placed on RLE, no indication for surgical intervention.  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  Today he returns for HF follow up. Struggle with his balance due to amputations. SOB with mild activity. Lives in camper so doesn't get around much. No CP. Compliant with meds. Fishing a bunch.   Cardiac Studies: - Echo (5/24): EF < 20%, LV with GHK, RV mildly reduced, LA/RA sev dilated, mild-mod MR, mild TR, trivial pericardial effusion.   - Bedside echo (9/23): EF 20-25%  - Echo (4/23): EF 20-25%, regional WMA, moderately dilated LV, RV moderately reduced, RVSP 40 mmHg, mild to moderate MR, dilated IVC with estimated RAP 15  mmHg.  - R/LHC (4/23):    The left ventricular ejection fraction is less than 25% by visual estimate.  Ao = 97/67 (85) LV = 105/25 RA = 8 RV = 37/14 PA = 35/22 (25) PCW = 20 Fick cardiac output/index = 5.9/2.4 PVR = 0.8 WU SVR = 1040 Ao sat = 97% PA sat = 66%, 67%   1. Normal coronary arteries 2. 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 Mildly elevated filling pressures with normal cardiac output  cMRI (4/24): poor quality, LVEF 17%, RVEF 22%,  appears to have RV insertion site LGE (which is a nonspecific scar finding often seen in setting of elevated pulmonary pressures) and basal septal midwall LGE (which is a scar pattern seen in NICM and associated with worse prognosis)   Past Medical History:  Diagnosis Date   CHF (congestive heart failure) (HCC)    Diabetes mellitus without complication (HCC)    Type II   Heart failure with reduced ejection fraction (HCC) 11/26/2021   Hypertension    Current Outpatient Medications  Medication Sig Dispense Refill   Blood Glucose Monitoring Suppl (BLOOD GLUCOSE MONITOR SYSTEM) w/Device KIT Use to check blood sugars 4 times daily. 1 kit 0   carvedilol (COREG) 6.25 MG tablet Take 1 tablet (6.25 mg total) by mouth 2 (two) times daily. 180 tablet 3   Continuous Glucose Receiver (DEXCOM G7 RECEIVER) DEVI Place new sensor every 10 days. Use to monitor your blood sugar continuously. 1 each 1   Continuous Glucose Sensor (DEXCOM G7 SENSOR) MISC Place new sensor every 10 days. Use to monitor your blood  sugar continuously. 9 each 3   digoxin (LANOXIN) 0.125 MG tablet Take 1 tablet (0.125 mg total) by mouth daily. 30 tablet 3   furosemide (LASIX) 40 MG tablet Take 2 tablets (80 mg total) by mouth daily. 60 tablet 11   NOVOLOG MIX 70/30 FLEXPEN (70-30) 100 UNIT/ML FlexPen INJECT 25 UNITS SUBCUTANEOUSLY TWICE DAILY WITH A MEAL 15 mL 0   potassium chloride SA (KLOR-CON M) 20 MEQ tablet Take 2 tablets (40 mEq total) by mouth daily. 60 tablet 5   sacubitril-valsartan  (ENTRESTO) 97-103 MG Take 1 tablet by mouth 2 (two) times daily. (Patient taking differently: Take 1 tablet by mouth daily.) 60 tablet 3   spironolactone (ALDACTONE) 25 MG tablet Take 1 tablet (25 mg total) by mouth daily. 30 tablet 3   No current facility-administered medications for this encounter.   No Known Allergies  Social History   Socioeconomic History   Marital status: Single    Spouse name: Not on file   Number of children: Not on file   Years of education: Not on file   Highest education level: Not on file  Occupational History   Not on file  Tobacco Use   Smoking status: Never   Smokeless tobacco: Never  Vaping Use   Vaping status: Never Used  Substance and Sexual Activity   Alcohol use: Not Currently    Comment: rare   Drug use: Never   Sexual activity: Not on file  Other Topics Concern   Not on file  Social History Narrative   Not on file   Social Determinants of Health   Financial Resource Strain: High Risk (01/10/2023)   Overall Financial Resource Strain (CARDIA)    Difficulty of Paying Living Expenses: Hard  Food Insecurity: Food Insecurity Present (12/28/2022)   Hunger Vital Sign    Worried About Running Out of Food in the Last Year: Often true    Ran Out of Food in the Last Year: Often true  Transportation Needs: No Transportation Needs (11/29/2021)   PRAPARE - Administrator, Civil Service (Medical): No    Lack of Transportation (Non-Medical): No  Physical Activity: Not on file  Stress: No Stress Concern Present (02/28/2021)   Received from [facility], [facility] - Occupational Stress Questionnaire    Feeling of Stress : Not at all  Social Connections: Unknown (12/15/2021)   Received from [facility], [facility]   Social Network    Social Network: Not on file  Intimate Partner Violence: Unknown (11/14/2021)   Received from [facility], [facility]   HITS    Physically Hurt: Not on  file    Insult or Talk Down To: Not on file    Threaten Physical Harm: Not on file    Scream or Curse: Not on file   Family History  Problem Relation Age of Onset   Diabetes Mother    Hypertension Mother    BP (!) 144/94   Pulse (!) 105   Wt 118.8 kg (262 lb)   SpO2 98%   BMI 36.54 kg/m   Wt Readings from Last 3 Encounters:  04/22/23 118.8 kg (262 lb)  02/25/23 115.8 kg (255 lb 6.4 oz)  02/25/23 115.9 kg (255 lb 9.6 oz)   PHYSICAL EXAM: General:  Sitting on exam table. No resp difficulty HEENT: normal Neck: supple. JVP hard to see Carotids 2+ bilat; no bruits. No lymphadenopathy or thryomegaly appreciated. Cor: PMI nondisplaced. Regular tachy.  No rubs, gallops or murmurs. Lungs: clear Abdomen: obese soft, nontender, nondistended. No hepatosplenomegaly. No bruits or masses. Good bowel sounds. Extremities: no cyanosis, clubbing, rash, 1+ edema on right (wrapped) trace edema on left Neuro: alert & orientedx3, cranial nerves grossly intact. moves all 4 extremities w/o difficulty. Affect pleasant  ReDS 35%  ECG: Sinus tach 100 T wave flattening Personally reviewed   ASSESSMENT & PLAN:   Chronic Systolic Heart Failure - NICM. Reports hx CHF in his mother. No hx alcohol or drug abuse. 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 RVEF 22% LGE imaging very poor quality, which limits interpretation. Appears to have RV insertion site LGE (associated with worse prognosis) - Echo (5/24): EF < 20%, LV with GHK, RV mildly reduced, LA/RA sev dilated, mild-mod MR, mild TR, trivial pericardial effusion.  - Stable NYHA III, colume status ok ReDS 35% - Continue Coreg 6.25 mg bid - Continue digoxin 0.125 mg  daily. - Continue Lasix 60 mg daily. Can take extra as needed - Continue Entresto 97/103 mg bid. -> has not taken meds yet today. Stressed need for compliance - Continue spiro 25 mg daily. - No SGLT2i with uncontrolled DM and concern for hygiene. - Not candidate for ICD with ongoing LE wounds.  - Labs today  2. Uncontrolled DM2 - Most recent A1C 11.2% (5/24)  - He is on insulin - Not following sugars closely - Per PCP   3. Hx osteomyelitis s/p right TMA - 2/2 poorly controlled DM   4. HTN - BP high today in setting of not taking am meds - Stressed need for better compliance   5. Hypoalbuminemia - Liver US with echogenic parenchyma, ? Hepatic steatosis or fibrosis. Cannot exclude cirrhosis.  6. SDOH  - Now insured. - Lives in a camper behind father's house. Unemployed and has limited social support. - Needs Paramedicine but he has refulsed, HFSW helping with disability/food stamps.  Arvilla Meres, MD 04/22/23 2:04 PM

## 2023-04-22 ENCOUNTER — Encounter (HOSPITAL_COMMUNITY): Payer: Self-pay | Admitting: Internal Medicine

## 2023-04-22 ENCOUNTER — Ambulatory Visit (HOSPITAL_COMMUNITY)
Admission: RE | Admit: 2023-04-22 | Discharge: 2023-04-22 | Disposition: A | Discharge status: Home / Self Care | Payer: 59 | Source: Ambulatory Visit | Attending: Internal Medicine | Admitting: Internal Medicine

## 2023-04-22 VITALS — BP 144/94 | HR 105 | Wt 262.0 lb

## 2023-04-22 DIAGNOSIS — E8809 Other disorders of plasma-protein metabolism, not elsewhere classified: Secondary | ICD-10-CM | POA: Insufficient documentation

## 2023-04-22 DIAGNOSIS — Z794 Long term (current) use of insulin: Secondary | ICD-10-CM | POA: Diagnosis not present

## 2023-04-22 DIAGNOSIS — Z560 Unemployment, unspecified: Secondary | ICD-10-CM | POA: Diagnosis not present

## 2023-04-22 DIAGNOSIS — I5022 Chronic systolic (congestive) heart failure: Secondary | ICD-10-CM

## 2023-04-22 DIAGNOSIS — E1159 Type 2 diabetes mellitus with other circulatory complications: Secondary | ICD-10-CM | POA: Diagnosis not present

## 2023-04-22 DIAGNOSIS — I428 Other cardiomyopathies: Secondary | ICD-10-CM | POA: Insufficient documentation

## 2023-04-22 DIAGNOSIS — K7689 Other specified diseases of liver: Secondary | ICD-10-CM | POA: Insufficient documentation

## 2023-04-22 DIAGNOSIS — R9431 Abnormal electrocardiogram [ECG] [EKG]: Secondary | ICD-10-CM | POA: Insufficient documentation

## 2023-04-22 DIAGNOSIS — E1165 Type 2 diabetes mellitus with hyperglycemia: Secondary | ICD-10-CM | POA: Diagnosis not present

## 2023-04-22 DIAGNOSIS — I10 Essential (primary) hypertension: Secondary | ICD-10-CM

## 2023-04-22 DIAGNOSIS — Z597 Insufficient social insurance and welfare support: Secondary | ICD-10-CM | POA: Diagnosis not present

## 2023-04-22 DIAGNOSIS — I110 Hypertensive heart disease with heart failure: Secondary | ICD-10-CM | POA: Insufficient documentation

## 2023-04-22 LAB — COMPREHENSIVE METABOLIC PANEL
ALT: 11 U/L (ref 0–44)
AST: 13 U/L — ABNORMAL LOW (ref 15–41)
Albumin: 2.3 g/dL — ABNORMAL LOW (ref 3.5–5.0)
Alkaline Phosphatase: 137 U/L — ABNORMAL HIGH (ref 38–126)
Anion gap: 6 (ref 5–15)
BUN: 13 mg/dL (ref 6–20)
CO2: 29 mmol/L (ref 22–32)
Calcium: 8.4 mg/dL — ABNORMAL LOW (ref 8.9–10.3)
Chloride: 97 mmol/L — ABNORMAL LOW (ref 98–111)
Creatinine, Ser: 0.81 mg/dL (ref 0.61–1.24)
GFR, Estimated: >60 mL/min (ref >60)
Glucose, Bld: 211 mg/dL — ABNORMAL HIGH (ref 70–99)
Potassium: 4.1 mmol/L (ref 3.5–5.1)
Sodium: 132 mmol/L — ABNORMAL LOW (ref 135–145)
Total Bilirubin: 0.5 mg/dL (ref 0.3–1.2)
Total Protein: 7.7 g/dL (ref 6.5–8.1)

## 2023-04-22 LAB — BRAIN NATRIURETIC PEPTIDE: B Natriuretic Peptide: 839.3 pg/mL — ABNORMAL HIGH (ref 0.0–100.0)

## 2023-04-22 LAB — DIGOXIN LEVEL: Digoxin Level: <0.2 ng/mL — ABNORMAL LOW (ref 0.8–2.0)

## 2023-04-22 NOTE — Patient Instructions (Signed)
Medication Changes:  None    Lab Work:  Labs done today, your results will be available in MyChart, we will contact you for abnormal readings.   Follow-Up in: 2 months with our NP/PA Clinic here in our office   At the [facility], you and your health needs are our priority. We have a designated team specialized in the treatment of Heart Failure. This Care Team includes your primary Heart Failure Specialized Cardiologist (physician), Advanced Practice Providers (APPs- Physician Assistants and Nurse Practitioners), and Pharmacist who all work together to provide you with the care you need, when you need it.   You may see any of the following providers on your designated Care Team at your next follow up:  Dr. Arvilla Meres Dr. Marca Ancona Dr. Marcos Eke, NP Robbie Lis, [place] [facility] [place], [place] Brynda Peon, NP Karle Plumber, PharmD   Please be sure to bring in all your medications bottles to every appointment.   Need to Contact us:  If you have any questions or concerns before your next appointment please send [place] a message through [place] or call our office at [phone].    TO LEAVE A MESSAGE FOR THE NURSE SELECT OPTION 2, PLEASE LEAVE A MESSAGE INCLUDING: YOUR NAME DATE OF BIRTH CALL BACK NUMBER REASON FOR CALL**this is important as we prioritize the call backs  YOU WILL RECEIVE A CALL BACK THE SAME DAY AS LONG AS YOU CALL BEFORE 4:00 PM

## 2023-04-22 NOTE — Progress Notes (Signed)
ReDS Vest / Clip - 04/22/23 1400       ReDS Vest / Clip   Station Marker D    Ruler Value 42    ReDS Value Range Moderate volume overload    ReDS Actual Value 35

## 2023-04-23 ENCOUNTER — Ambulatory Visit (INDEPENDENT_AMBULATORY_CARE_PROVIDER_SITE_OTHER): Payer: 59 | Admitting: Orthopedic Surgery

## 2023-04-23 DIAGNOSIS — L89623 Pressure ulcer of left heel, stage 3: Secondary | ICD-10-CM

## 2023-04-23 DIAGNOSIS — S98112A Complete traumatic amputation of left great toe, initial encounter: Secondary | ICD-10-CM

## 2023-04-23 DIAGNOSIS — Z89412 Acquired absence of left great toe: Secondary | ICD-10-CM

## 2023-04-24 ENCOUNTER — Encounter: Payer: Self-pay | Admitting: Orthopedic Surgery

## 2023-04-24 NOTE — Progress Notes (Signed)
Office Visit Note   Patient: Dakota Ramirez           Date of Birth: 08-May-1971           MRN: [id number] Visit Date: 04/23/2023              Requested by: Annett Fabian, MD [address] [place],  [place] [address] PCP: Annett Fabian, MD  Chief Complaint  Patient presents with   Left Foot - Routine Post Op    01/25/2023 left GT amputation and left heel debridement       HPI: Patient is a 52 year old gentleman who is almost 3 months status post left foot great toe amputation as well as conservative treatment for a left heel decubitus ulcer.  Patient is in a postoperative shoe with a felt relieving donut.  Assessment & Plan: Visit Diagnoses:  1. Amputated great toe of left foot (HCC)   2. Decubitus ulcer of left heel, stage 3 (HCC)     Plan: Will apply a new felt relieving donut recommend minimizing weightbearing on the left heel.  Follow-Up Instructions: Return in about 2 weeks (around 05/07/2023).   Ortho Exam  Patient is alert, oriented, no adenopathy, well-dressed, normal affect, normal respiratory effort. Examination the amputation site is well-healed for the great toe.  Patient has a persistent Wagner grade 1 ulcer over the plantar heel.  This is 2 cm in diameter and 1 cm deep with healthy granulation tissue at the base no exposed bone.  We will apply a new felt relieving donut to unload pressure.  Imaging: No results found. No images are attached to the encounter.  Labs: Lab Results  Component Value Date   HGBA1C 11.2 (H) 12/26/2022   HGBA1C 12.4 (H) 11/26/2021   ESRSEDRATE 68 (H) 12/28/2022   CRP 5.6 (H) 12/28/2022     Lab Results  Component Value Date   ALBUMIN 2.3 (L) 04/22/2023   ALBUMIN 1.8 (L) 12/26/2022   ALBUMIN 2.5 (L) 11/29/2021    Lab Results  Component Value Date   MG 1.4 (L) 12/29/2022   MG 1.8 12/28/2022   MG 1.2 (L) 12/27/2022   No results found for: "VD25OH"  No results found for: "PREALBUMIN"    Latest Ref Rng & Units 01/25/2023     8:34 AM 12/26/2022    2:19 PM 12/26/2022   11:32 AM  CBC EXTENDED  WBC 4.0 - 10.5 K/uL 8.0  5.8  5.8   RBC 4.22 - 5.81 MIL/uL 5.50  4.64  4.99   Hemoglobin 13.0 - 17.0 g/dL 19.1  47.8  29.5   HCT 39.0 - 52.0 % 46.7  41.7  44.2   Platelets 150 - 400 K/uL 212  247  238      There is no height or weight on file to calculate BMI.  Orders:  No orders of the defined types were placed in this encounter.  No orders of the defined types were placed in this encounter.    Procedures: No procedures performed  Clinical Data: No additional findings.  ROS:  All other systems negative, except as noted in the HPI. Review of Systems  Objective: Vital Signs: There were no vitals taken for this visit.  Specialty Comments:  No specialty comments available.  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 01/10/2023   Diabetic osteomyelitis (  HCC) 12/28/2022   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 to breakdown of skin (HCC) 12/28/2022   Severe protein-calorie malnutrition (HCC) 12/28/2022   Elevated alkaline phosphatase level 12/27/2022   Acute on chronic heart failure (HCC) 12/26/2022   Hyponatremia 11/29/2021   PVD (peripheral vascular disease) (HCC) 11/29/2021   Class 2 obesity 11/29/2021   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) 11/26/2021   Type 2 diabetes mellitus with foot ulcer, with long-term current use of insulin (HCC) 11/26/2021   Essential hypertension 11/26/2021   Past Medical History:  Diagnosis Date   CHF (congestive heart failure) (HCC)    Diabetes mellitus without complication (HCC)    Type II   Heart failure with reduced ejection fraction (HCC) 11/26/2021   Hypertension     Family History  Problem Relation Age of Onset   Diabetes Mother    Hypertension Mother      Past Surgical History:  Procedure Laterality Date   AMPUTATION Left 01/25/2023   Procedure: LEFT GREAT TOE AMPUTATION AND DEBRIDEMENT OF HEEL;  Surgeon: Nadara Mustard, MD;  Location: [facility] OR;  Service: Orthopedics;  Laterality: Left;   BACK SURGERY     CHOLECYSTECTOMY     RIGHT/LEFT HEART CATH AND CORONARY ANGIOGRAPHY N/A 11/30/2021   Procedure: RIGHT/LEFT HEART CATH AND CORONARY ANGIOGRAPHY;  Surgeon: Dolores Patty, MD;  Location: [facility] INVASIVE CV LAB;  Service: Cardiovascular;  Laterality: N/A;   TOE AMPUTATION Right    all toes on RT foot   Social History   Occupational History   Not on file  Tobacco Use   Smoking status: Never   Smokeless tobacco: Never  Vaping Use   Vaping status: Never Used  Substance and Sexual Activity   Alcohol use: Not Currently    Comment: rare   Drug use: Never   Sexual activity: Not on file

## 2023-05-13 ENCOUNTER — Ambulatory Visit: Payer: 59 | Admitting: Orthopedic Surgery

## 2023-05-14 DIAGNOSIS — Z419 Encounter for procedure for purposes other than remedying health state, unspecified: Secondary | ICD-10-CM | POA: Diagnosis not present

## 2023-05-22 ENCOUNTER — Other Ambulatory Visit: Payer: Self-pay | Admitting: Internal Medicine

## 2023-05-22 NOTE — Telephone Encounter (Signed)
LOV  01/17/23. Called pt to  schedule an appt - no answer, mailbox full unable to leave a message.

## 2023-05-23 ENCOUNTER — Ambulatory Visit (INDEPENDENT_AMBULATORY_CARE_PROVIDER_SITE_OTHER): Payer: 59 | Admitting: Orthopedic Surgery

## 2023-05-23 DIAGNOSIS — L89623 Pressure ulcer of left heel, stage 3: Secondary | ICD-10-CM

## 2023-05-23 DIAGNOSIS — S98112A Complete traumatic amputation of left great toe, initial encounter: Secondary | ICD-10-CM

## 2023-05-23 DIAGNOSIS — Z89412 Acquired absence of left great toe: Secondary | ICD-10-CM

## 2023-05-27 ENCOUNTER — Encounter: Payer: Self-pay | Admitting: Orthopedic Surgery

## 2023-05-27 NOTE — Progress Notes (Signed)
Office Visit Note   Patient: Dakota Ramirez           Date of Birth: 23-Jul-1971           MRN: [id number] Visit Date: 05/23/2023              Requested by: Annett Fabian, MD [address] [place],  [place] [address] PCP: Annett Fabian, MD  Chief Complaint  Patient presents with   Left Foot - Follow-up    01/25/2023 left GT amputation and left heel debridement       HPI: Patient is a 52 year old gentleman who presents for 2 separate issues.  #1 he is 4 months status post left great toe amputation.  #2 Wagner grade 1 ulcer left heel.  Patient does have a new felt relieving donut to unload pressure from the heel in his postoperative shoe.  Assessment & Plan: Visit Diagnoses:  1. Amputated great toe of left foot (HCC)   2. Decubitus ulcer of left heel, stage 3 (HCC)     Plan: Patient will continue with pressure offloading dressing changes daily.  Follow-Up Instructions: Return in about 2 weeks (around 06/06/2023).   Ortho Exam  Patient is alert, oriented, no adenopathy, well-dressed, normal affect, normal respiratory effort. Examination of the great toe amputation is well-healed there is no redness cellulitis or drainage.  Examination of the heel he has a Wagner grade 1 ulcer.  There is no cellulitis no tunneling.  After informed consent a 10 blade knife was used to debride the skin and soft tissue back to healthy viable granulation tissue.  Silver nitrate was used hemostasis.  The ulcer is 4 cm in diameter and 2 mm deep after debridement.  There is no tunneling to bone.  Patient's most recent hemoglobin A1c is 11.2.  Imaging: No results found. No images are attached to the encounter.  Labs: Lab Results  Component Value Date   HGBA1C 11.2 (H) 12/26/2022   HGBA1C 12.4 (H) 11/26/2021   ESRSEDRATE 68 (H) 12/28/2022   CRP 5.6 (H) 12/28/2022     Lab Results  Component Value Date   ALBUMIN 2.3 (L) 04/22/2023   ALBUMIN 1.8 (L) 12/26/2022   ALBUMIN 2.5 (L) 11/29/2021     Lab Results  Component Value Date   MG 1.4 (L) 12/29/2022   MG 1.8 12/28/2022   MG 1.2 (L) 12/27/2022   No results found for: "VD25OH"  No results found for: "PREALBUMIN"    Latest Ref Rng & Units 01/25/2023    8:34 AM 12/26/2022    2:19 PM 12/26/2022   11:32 AM  CBC EXTENDED  WBC 4.0 - 10.5 K/uL 8.0  5.8  5.8   RBC 4.22 - 5.81 MIL/uL 5.50  4.64  4.99   Hemoglobin 13.0 - 17.0 g/dL 82.9  56.2  13.0   HCT 39.0 - 52.0 % 46.7  41.7  44.2   Platelets 150 - 400 K/uL 212  247  238      There is no height or weight on file to calculate BMI.  Orders:  No orders of the defined types were placed in this encounter.  No orders of the defined types were placed in this encounter.    Procedures: No procedures performed  Clinical Data: No additional findings.  ROS:  All other systems negative, except as noted in the HPI. Review of Systems  Objective: Vital Signs: There were no vitals taken for this visit.  Specialty Comments:  No specialty comments available.  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) 12/28/2022   Severe protein-calorie malnutrition (HCC) 12/28/2022   Elevated alkaline phosphatase level 12/27/2022   Acute on chronic heart failure (HCC) 12/26/2022   Hyponatremia 11/29/2021   PVD (peripheral vascular disease) (HCC) 11/29/2021   Class 2 obesity 11/29/2021   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) 11/26/2021   Type 2 diabetes mellitus with foot ulcer, with long-term current use of insulin (HCC) 11/26/2021   Essential hypertension 11/26/2021   Past Medical History:  Diagnosis Date   CHF (congestive heart failure) (HCC)    Diabetes  mellitus without complication (HCC)    Type II   Heart failure with reduced ejection fraction (HCC) 11/26/2021   Hypertension     Family History  Problem Relation Age of Onset   Diabetes Mother    Hypertension Mother     Past Surgical History:  Procedure Laterality Date   AMPUTATION Left 01/25/2023   Procedure: LEFT GREAT TOE AMPUTATION AND DEBRIDEMENT OF HEEL;  Surgeon: Nadara Mustard, MD;  Location: [facility] OR;  Service: Orthopedics;  Laterality: Left;   BACK SURGERY     CHOLECYSTECTOMY     RIGHT/LEFT HEART CATH AND CORONARY ANGIOGRAPHY N/A 11/30/2021   Procedure: RIGHT/LEFT HEART CATH AND CORONARY ANGIOGRAPHY;  Surgeon: Dolores Patty, MD;  Location: [facility] INVASIVE CV LAB;  Service: Cardiovascular;  Laterality: N/A;   TOE AMPUTATION Right    all toes on RT foot   Social History   Occupational History   Not on file  Tobacco Use   Smoking status: Never   Smokeless tobacco: Never  Vaping Use   Vaping status: Never Used  Substance and Sexual Activity   Alcohol use: Not Currently    Comment: rare   Drug use: Never   Sexual activity: Not on file

## 2023-06-06 ENCOUNTER — Ambulatory Visit (INDEPENDENT_AMBULATORY_CARE_PROVIDER_SITE_OTHER): Payer: 59 | Admitting: Orthopedic Surgery

## 2023-06-06 DIAGNOSIS — S98112A Complete traumatic amputation of left great toe, initial encounter: Secondary | ICD-10-CM

## 2023-06-06 DIAGNOSIS — Z89412 Acquired absence of left great toe: Secondary | ICD-10-CM

## 2023-06-10 ENCOUNTER — Encounter: Payer: Self-pay | Admitting: Orthopedic Surgery

## 2023-06-10 NOTE — Progress Notes (Signed)
Morning  Office Visit Note   Patient: Dakota Ramirez           Date of Birth: 03/23/1971           MRN: [id number] Visit Date: 06/06/2023              Requested by: Annett Fabian, MD [address] [place],  [place] [address] PCP: Annett Fabian, MD  Chief Complaint  Patient presents with   Left Foot - Follow-up    01/25/2023 left GT amputation and left heel debridement       HPI: Patient is a 52 year old gentleman who is 4 months status post left great toe amputation and debridement left heel ulcer.  Assessment & Plan: Visit Diagnoses:  1. 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  Recommended decreasing weightbearing.  Patient does have a felt relieving donut in a postoperative shoe.  Plan for 2 view radiographs of the left calcaneus at part follow-up  Follow-Up Instructions: Return in about 2 weeks (around 06/20/2023).   Ortho Exam  Patient is alert, oriented, no adenopathy, well-dressed, normal affect, normal respiratory effort. Examination patient has a palpable pulse ankle-brachial indices were within normal range.  The great toe amputation is healed well.  Over the heel he has a 2 x 3 cm wound that is 5 mm deep there is healthy granulation tissue without tunneling and without cellulitis.  The skin is macerated from drainage.  Imaging: No results found. No images are attached to the encounter.  Labs: Lab Results  Component Value Date   HGBA1C 11.2 (H) 12/26/2022   HGBA1C 12.4 (H) 11/26/2021   ESRSEDRATE 68 (H) 12/28/2022   CRP 5.6 (H) 12/28/2022     Lab Results  Component Value Date   ALBUMIN 2.3 (L) 04/22/2023   ALBUMIN 1.8 (L) 12/26/2022   ALBUMIN 2.5 (L) 11/29/2021    Lab Results  Component Value Date   MG 1.4 (L) 12/29/2022   MG 1.8 12/28/2022   MG 1.2 (L) 12/27/2022   No results found for: "VD25OH"  No results found for: "PREALBUMIN"    Latest Ref Rng & Units 01/25/2023    8:34 AM 12/26/2022    2:19 PM 12/26/2022    11:32 AM  CBC EXTENDED  WBC 4.0 - 10.5 K/uL 8.0  5.8  5.8   RBC 4.22 - 5.81 MIL/uL 5.50  4.64  4.99   Hemoglobin 13.0 - 17.0 g/dL 86.5  78.4  69.6   HCT 39.0 - 52.0 % 46.7  41.7  44.2   Platelets 150 - 400 K/uL 212  247  238      There is no height or weight on file to calculate BMI.  Orders:  No orders of the defined types were placed in this encounter.  No orders of the defined types were placed in this encounter.    Procedures: No procedures performed  Clinical Data: No additional findings.  ROS:  All other systems negative, except as noted in the HPI. Review of Systems  Objective: Vital Signs: There were no vitals taken for this visit.  Specialty Comments:  No specialty comments available.  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 of  skin (HCC) 12/28/2022   Severe protein-calorie malnutrition (HCC) 12/28/2022   Elevated alkaline phosphatase level 12/27/2022   Acute on chronic heart failure (HCC) 12/26/2022   Hyponatremia 11/29/2021   PVD (peripheral vascular disease) (HCC) 11/29/2021   Class 2 obesity 11/29/2021   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) 11/26/2021   Type 2 diabetes mellitus with foot ulcer, with long-term current use of insulin (HCC) 11/26/2021   Essential hypertension 11/26/2021   Past Medical History:  Diagnosis Date   CHF (congestive heart failure) (HCC)    Diabetes mellitus without complication (HCC)    Type II   Heart failure with reduced ejection fraction (HCC) 11/26/2021   Hypertension     Family History  Problem Relation Age of Onset   Diabetes Mother    Hypertension Mother     Past Surgical History:  Procedure  Laterality Date   AMPUTATION Left 01/25/2023   Procedure: LEFT GREAT TOE AMPUTATION AND DEBRIDEMENT OF HEEL;  Surgeon: Nadara Mustard, MD;  Location: [facility] OR;  Service: Orthopedics;  Laterality: Left;   BACK SURGERY     CHOLECYSTECTOMY     RIGHT/LEFT HEART CATH AND CORONARY ANGIOGRAPHY N/A 11/30/2021   Procedure: RIGHT/LEFT HEART CATH AND CORONARY ANGIOGRAPHY;  Surgeon: Dolores Patty, MD;  Location: [facility] INVASIVE CV LAB;  Service: Cardiovascular;  Laterality: N/A;   TOE AMPUTATION Right    all toes on RT foot   Social History   Occupational History   Not on file  Tobacco Use   Smoking status: Never   Smokeless tobacco: Never  Vaping Use   Vaping status: Never Used  Substance and Sexual Activity   Alcohol use: Not Currently    Comment: rare   Drug use: Never   Sexual activity: Not on file

## 2023-06-14 DIAGNOSIS — Z419 Encounter for procedure for purposes other than remedying health state, unspecified: Secondary | ICD-10-CM | POA: Diagnosis not present

## 2023-06-22 ENCOUNTER — Other Ambulatory Visit: Payer: Self-pay | Admitting: Student

## 2023-06-24 ENCOUNTER — Ambulatory Visit: Payer: 59 | Admitting: Orthopedic Surgery

## 2023-06-24 ENCOUNTER — Encounter (HOSPITAL_COMMUNITY): Payer: 59

## 2023-06-25 ENCOUNTER — Other Ambulatory Visit: Payer: Self-pay

## 2023-06-25 ENCOUNTER — Inpatient Hospital Stay (HOSPITAL_COMMUNITY)
Admission: EM | Admit: 2023-06-25 | Discharge: 2023-07-01 | DRG: 871 | Discharge status: Against Medical Advice | Payer: 59 | Attending: Internal Medicine | Admitting: Internal Medicine

## 2023-06-25 ENCOUNTER — Encounter (HOSPITAL_COMMUNITY): Payer: Self-pay | Admitting: *Deleted

## 2023-06-25 ENCOUNTER — Emergency Department (HOSPITAL_COMMUNITY): Payer: 59

## 2023-06-25 DIAGNOSIS — R6520 Severe sepsis without septic shock: Secondary | ICD-10-CM | POA: Diagnosis not present

## 2023-06-25 DIAGNOSIS — R6521 Severe sepsis with septic shock: Secondary | ICD-10-CM | POA: Diagnosis present

## 2023-06-25 DIAGNOSIS — R0602 Shortness of breath: Secondary | ICD-10-CM | POA: Diagnosis not present

## 2023-06-25 DIAGNOSIS — Z91199 Patient's noncompliance with other medical treatment and regimen due to unspecified reason: Secondary | ICD-10-CM

## 2023-06-25 DIAGNOSIS — Z833 Family history of diabetes mellitus: Secondary | ICD-10-CM

## 2023-06-25 DIAGNOSIS — J811 Chronic pulmonary edema: Secondary | ICD-10-CM | POA: Diagnosis not present

## 2023-06-25 DIAGNOSIS — I5023 Acute on chronic systolic (congestive) heart failure: Secondary | ICD-10-CM | POA: Diagnosis present

## 2023-06-25 DIAGNOSIS — I428 Other cardiomyopathies: Secondary | ICD-10-CM | POA: Diagnosis present

## 2023-06-25 DIAGNOSIS — E1169 Type 2 diabetes mellitus with other specified complication: Secondary | ICD-10-CM | POA: Diagnosis present

## 2023-06-25 DIAGNOSIS — A419 Sepsis, unspecified organism: Principal | ICD-10-CM | POA: Diagnosis present

## 2023-06-25 DIAGNOSIS — R0989 Other specified symptoms and signs involving the circulatory and respiratory systems: Secondary | ICD-10-CM | POA: Diagnosis not present

## 2023-06-25 DIAGNOSIS — K761 Chronic passive congestion of liver: Secondary | ICD-10-CM | POA: Diagnosis present

## 2023-06-25 DIAGNOSIS — I2489 Other forms of acute ischemic heart disease: Secondary | ICD-10-CM | POA: Diagnosis present

## 2023-06-25 DIAGNOSIS — E46 Unspecified protein-calorie malnutrition: Secondary | ICD-10-CM | POA: Diagnosis present

## 2023-06-25 DIAGNOSIS — M545 Low back pain, unspecified: Secondary | ICD-10-CM | POA: Diagnosis present

## 2023-06-25 DIAGNOSIS — E861 Hypovolemia: Secondary | ICD-10-CM | POA: Diagnosis not present

## 2023-06-25 DIAGNOSIS — E8809 Other disorders of plasma-protein metabolism, not elsewhere classified: Secondary | ICD-10-CM | POA: Diagnosis present

## 2023-06-25 DIAGNOSIS — E875 Hyperkalemia: Secondary | ICD-10-CM | POA: Diagnosis present

## 2023-06-25 DIAGNOSIS — E669 Obesity, unspecified: Secondary | ICD-10-CM | POA: Diagnosis present

## 2023-06-25 DIAGNOSIS — Z8249 Family history of ischemic heart disease and other diseases of the circulatory system: Secondary | ICD-10-CM

## 2023-06-25 DIAGNOSIS — Z5329 Procedure and treatment not carried out because of patient's decision for other reasons: Secondary | ICD-10-CM | POA: Diagnosis present

## 2023-06-25 DIAGNOSIS — R570 Cardiogenic shock: Secondary | ICD-10-CM | POA: Diagnosis present

## 2023-06-25 DIAGNOSIS — R579 Shock, unspecified: Secondary | ICD-10-CM | POA: Diagnosis present

## 2023-06-25 DIAGNOSIS — L03116 Cellulitis of left lower limb: Secondary | ICD-10-CM | POA: Diagnosis present

## 2023-06-25 DIAGNOSIS — T502X6A Underdosing of carbonic-anhydrase inhibitors, benzothiadiazides and other diuretics, initial encounter: Secondary | ICD-10-CM | POA: Diagnosis present

## 2023-06-25 DIAGNOSIS — M86671 Other chronic osteomyelitis, right ankle and foot: Secondary | ICD-10-CM | POA: Diagnosis present

## 2023-06-25 DIAGNOSIS — N179 Acute kidney failure, unspecified: Principal | ICD-10-CM

## 2023-06-25 DIAGNOSIS — E876 Hypokalemia: Secondary | ICD-10-CM | POA: Diagnosis present

## 2023-06-25 DIAGNOSIS — I330 Acute and subacute infective endocarditis: Secondary | ICD-10-CM | POA: Diagnosis present

## 2023-06-25 DIAGNOSIS — I5082 Biventricular heart failure: Secondary | ICD-10-CM | POA: Diagnosis present

## 2023-06-25 DIAGNOSIS — K7200 Acute and subacute hepatic failure without coma: Secondary | ICD-10-CM | POA: Diagnosis present

## 2023-06-25 DIAGNOSIS — E872 Acidosis, unspecified: Secondary | ICD-10-CM | POA: Diagnosis present

## 2023-06-25 DIAGNOSIS — K769 Liver disease, unspecified: Secondary | ICD-10-CM | POA: Insufficient documentation

## 2023-06-25 DIAGNOSIS — Z9049 Acquired absence of other specified parts of digestive tract: Secondary | ICD-10-CM

## 2023-06-25 DIAGNOSIS — M86672 Other chronic osteomyelitis, left ankle and foot: Secondary | ICD-10-CM | POA: Diagnosis present

## 2023-06-25 DIAGNOSIS — Z89412 Acquired absence of left great toe: Secondary | ICD-10-CM

## 2023-06-25 DIAGNOSIS — Z91148 Patient's other noncompliance with medication regimen for other reason: Secondary | ICD-10-CM

## 2023-06-25 DIAGNOSIS — I051 Rheumatic mitral insufficiency: Secondary | ICD-10-CM | POA: Diagnosis present

## 2023-06-25 DIAGNOSIS — S91302A Unspecified open wound, left foot, initial encounter: Secondary | ICD-10-CM | POA: Diagnosis not present

## 2023-06-25 DIAGNOSIS — Z6835 Body mass index (BMI) 35.0-35.9, adult: Secondary | ICD-10-CM

## 2023-06-25 DIAGNOSIS — L97421 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 to breakdown of skin: Secondary | ICD-10-CM | POA: Diagnosis present

## 2023-06-25 DIAGNOSIS — I739 Peripheral vascular disease, unspecified: Secondary | ICD-10-CM | POA: Diagnosis present

## 2023-06-25 DIAGNOSIS — Z5941 Food insecurity: Secondary | ICD-10-CM

## 2023-06-25 DIAGNOSIS — E1165 Type 2 diabetes mellitus with hyperglycemia: Secondary | ICD-10-CM | POA: Diagnosis present

## 2023-06-25 DIAGNOSIS — Z794 Long term (current) use of insulin: Secondary | ICD-10-CM

## 2023-06-25 DIAGNOSIS — E11621 Type 2 diabetes mellitus with foot ulcer: Secondary | ICD-10-CM | POA: Diagnosis present

## 2023-06-25 DIAGNOSIS — G9341 Metabolic encephalopathy: Secondary | ICD-10-CM | POA: Diagnosis present

## 2023-06-25 DIAGNOSIS — Z5986 Financial insecurity: Secondary | ICD-10-CM

## 2023-06-25 DIAGNOSIS — F1510 Other stimulant abuse, uncomplicated: Secondary | ICD-10-CM | POA: Diagnosis present

## 2023-06-25 DIAGNOSIS — L03115 Cellulitis of right lower limb: Secondary | ICD-10-CM | POA: Diagnosis present

## 2023-06-25 DIAGNOSIS — Z79899 Other long term (current) drug therapy: Secondary | ICD-10-CM

## 2023-06-25 DIAGNOSIS — E871 Hypo-osmolality and hyponatremia: Secondary | ICD-10-CM | POA: Diagnosis present

## 2023-06-25 DIAGNOSIS — N170 Acute kidney failure with tubular necrosis: Secondary | ICD-10-CM | POA: Diagnosis present

## 2023-06-25 DIAGNOSIS — R7881 Bacteremia: Secondary | ICD-10-CM | POA: Insufficient documentation

## 2023-06-25 DIAGNOSIS — E1152 Type 2 diabetes mellitus with diabetic peripheral angiopathy with gangrene: Secondary | ICD-10-CM | POA: Diagnosis present

## 2023-06-25 DIAGNOSIS — S2242XA Multiple fractures of ribs, left side, initial encounter for closed fracture: Secondary | ICD-10-CM | POA: Diagnosis not present

## 2023-06-25 DIAGNOSIS — J9811 Atelectasis: Secondary | ICD-10-CM | POA: Diagnosis not present

## 2023-06-25 DIAGNOSIS — M869 Osteomyelitis, unspecified: Secondary | ICD-10-CM | POA: Diagnosis present

## 2023-06-25 DIAGNOSIS — I96 Gangrene, not elsewhere classified: Secondary | ICD-10-CM

## 2023-06-25 DIAGNOSIS — I130 Hypertensive heart and chronic kidney disease with heart failure and stage 1 through stage 4 chronic kidney disease, or unspecified chronic kidney disease: Secondary | ICD-10-CM | POA: Diagnosis present

## 2023-06-25 LAB — CBC WITH DIFFERENTIAL/PLATELET
Abs Immature Granulocytes: 0 10*3/uL (ref 0.00–0.07)
Basophils Absolute: 0 10*3/uL (ref 0.0–0.1)
Basophils Relative: 0 %
Eosinophils Absolute: 0 10*3/uL (ref 0.0–0.5)
Eosinophils Relative: 0 %
HCT: 40.9 % (ref 39.0–52.0)
Hemoglobin: 13.4 g/dL (ref 13.0–17.0)
Lymphocytes Relative: 2 %
Lymphs Abs: 0.4 10*3/uL — ABNORMAL LOW (ref 0.7–4.0)
MCH: 28.5 pg (ref 26.0–34.0)
MCHC: 32.8 g/dL (ref 30.0–36.0)
MCV: 87 fL (ref 80.0–100.0)
Monocytes Absolute: 0.8 10*3/uL (ref 0.1–1.0)
Monocytes Relative: 4 %
Neutro Abs: 17.8 10*3/uL — ABNORMAL HIGH (ref 1.7–7.7)
Neutrophils Relative %: 94 %
Platelets: 351 10*3/uL (ref 150–400)
RBC: 4.7 MIL/uL (ref 4.22–5.81)
RDW: 15.1 % (ref 11.5–15.5)
WBC: 18.9 10*3/uL — ABNORMAL HIGH (ref 4.0–10.5)
nRBC: 0 /100{WBCs}
nRBC: 0.6 % — ABNORMAL HIGH (ref 0.0–0.2)

## 2023-06-25 LAB — CBG MONITORING, ED: Glucose-Capillary: 160 mg/dL — ABNORMAL HIGH (ref 70–99)

## 2023-06-25 LAB — I-STAT CG4 LACTIC ACID, ED: Lactic Acid, Venous: 2.6 mmol/L (ref 0.5–1.9)

## 2023-06-25 MED ORDER — PIPERACILLIN-TAZOBACTAM 3.375 G IVPB 30 MIN
3.3750 g | Freq: Once | INTRAVENOUS | Status: AC
  Administered 2023-06-25: 3.375 g via INTRAVENOUS
  Filled 2023-06-25: qty 50

## 2023-06-25 MED ORDER — LACTATED RINGERS IV BOLUS
1000.0000 mL | Freq: Once | INTRAVENOUS | Status: AC
  Administered 2023-06-25: 1000 mL via INTRAVENOUS
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
## 2023-06-25 NOTE — ED Notes (Signed)
ED Provider at bedside. 

## 2023-06-25 NOTE — ED Triage Notes (Addendum)
Pt arrives via [employer]. Per report, pt is Coming from home. C/o shortness of breath, feels like he is retaining fluid, takes lasix at home. En route 132/80, hr 80, 96% RA, cbg 265, 18g in the right forearm. Hx of toe amputations, says he has fluid retention issues and takes lasix at home.
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
## 2023-06-26 ENCOUNTER — Inpatient Hospital Stay (HOSPITAL_COMMUNITY): Payer: 59

## 2023-06-26 ENCOUNTER — Other Ambulatory Visit (HOSPITAL_COMMUNITY): Payer: 59

## 2023-06-26 ENCOUNTER — Emergency Department (HOSPITAL_COMMUNITY): Payer: 59

## 2023-06-26 DIAGNOSIS — R0602 Shortness of breath: Secondary | ICD-10-CM | POA: Diagnosis not present

## 2023-06-26 DIAGNOSIS — R6521 Severe sepsis with septic shock: Secondary | ICD-10-CM

## 2023-06-26 DIAGNOSIS — I3139 Other pericardial effusion (noninflammatory): Secondary | ICD-10-CM | POA: Diagnosis not present

## 2023-06-26 DIAGNOSIS — M86671 Other chronic osteomyelitis, right ankle and foot: Secondary | ICD-10-CM | POA: Diagnosis not present

## 2023-06-26 DIAGNOSIS — L97421 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 to breakdown of skin: Secondary | ICD-10-CM | POA: Diagnosis not present

## 2023-06-26 DIAGNOSIS — M86672 Other chronic osteomyelitis, left ankle and foot: Secondary | ICD-10-CM | POA: Diagnosis not present

## 2023-06-26 DIAGNOSIS — I5023 Acute on chronic systolic (congestive) heart failure: Secondary | ICD-10-CM

## 2023-06-26 DIAGNOSIS — E669 Obesity, unspecified: Secondary | ICD-10-CM | POA: Diagnosis not present

## 2023-06-26 DIAGNOSIS — N170 Acute kidney failure with tubular necrosis: Secondary | ICD-10-CM | POA: Diagnosis not present

## 2023-06-26 DIAGNOSIS — I96 Gangrene, not elsewhere classified: Secondary | ICD-10-CM | POA: Diagnosis not present

## 2023-06-26 DIAGNOSIS — E1122 Type 2 diabetes mellitus with diabetic chronic kidney disease: Secondary | ICD-10-CM | POA: Diagnosis not present

## 2023-06-26 DIAGNOSIS — L03116 Cellulitis of left lower limb: Secondary | ICD-10-CM | POA: Diagnosis not present

## 2023-06-26 DIAGNOSIS — G9341 Metabolic encephalopathy: Secondary | ICD-10-CM | POA: Diagnosis not present

## 2023-06-26 DIAGNOSIS — N189 Chronic kidney disease, unspecified: Secondary | ICD-10-CM | POA: Diagnosis not present

## 2023-06-26 DIAGNOSIS — K7200 Acute and subacute hepatic failure without coma: Secondary | ICD-10-CM | POA: Diagnosis not present

## 2023-06-26 DIAGNOSIS — R579 Shock, unspecified: Secondary | ICD-10-CM | POA: Diagnosis present

## 2023-06-26 DIAGNOSIS — I10 Essential (primary) hypertension: Secondary | ICD-10-CM | POA: Diagnosis not present

## 2023-06-26 DIAGNOSIS — E46 Unspecified protein-calorie malnutrition: Secondary | ICD-10-CM | POA: Diagnosis not present

## 2023-06-26 DIAGNOSIS — Z452 Encounter for adjustment and management of vascular access device: Secondary | ICD-10-CM | POA: Diagnosis not present

## 2023-06-26 DIAGNOSIS — I2489 Other forms of acute ischemic heart disease: Secondary | ICD-10-CM | POA: Diagnosis not present

## 2023-06-26 DIAGNOSIS — L03115 Cellulitis of right lower limb: Secondary | ICD-10-CM | POA: Diagnosis not present

## 2023-06-26 DIAGNOSIS — R220 Localized swelling, mass and lump, head: Secondary | ICD-10-CM | POA: Diagnosis not present

## 2023-06-26 DIAGNOSIS — K5730 Diverticulosis of large intestine without perforation or abscess without bleeding: Secondary | ICD-10-CM | POA: Diagnosis not present

## 2023-06-26 DIAGNOSIS — E11621 Type 2 diabetes mellitus with foot ulcer: Secondary | ICD-10-CM | POA: Diagnosis not present

## 2023-06-26 DIAGNOSIS — I38 Endocarditis, valve unspecified: Secondary | ICD-10-CM | POA: Diagnosis not present

## 2023-06-26 DIAGNOSIS — I330 Acute and subacute infective endocarditis: Secondary | ICD-10-CM | POA: Diagnosis not present

## 2023-06-26 DIAGNOSIS — A419 Sepsis, unspecified organism: Secondary | ICD-10-CM

## 2023-06-26 DIAGNOSIS — I130 Hypertensive heart and chronic kidney disease with heart failure and stage 1 through stage 4 chronic kidney disease, or unspecified chronic kidney disease: Secondary | ICD-10-CM | POA: Diagnosis not present

## 2023-06-26 DIAGNOSIS — I428 Other cardiomyopathies: Secondary | ICD-10-CM | POA: Diagnosis not present

## 2023-06-26 DIAGNOSIS — E875 Hyperkalemia: Secondary | ICD-10-CM | POA: Diagnosis not present

## 2023-06-26 DIAGNOSIS — R570 Cardiogenic shock: Secondary | ICD-10-CM | POA: Diagnosis not present

## 2023-06-26 DIAGNOSIS — K761 Chronic passive congestion of liver: Secondary | ICD-10-CM | POA: Diagnosis not present

## 2023-06-26 DIAGNOSIS — L97429 Non-pressure chronic ulcer of left heel and midfoot with unspecified severity: Secondary | ICD-10-CM | POA: Diagnosis not present

## 2023-06-26 DIAGNOSIS — I517 Cardiomegaly: Secondary | ICD-10-CM | POA: Diagnosis not present

## 2023-06-26 DIAGNOSIS — N179 Acute kidney failure, unspecified: Principal | ICD-10-CM

## 2023-06-26 DIAGNOSIS — E871 Hypo-osmolality and hyponatremia: Secondary | ICD-10-CM | POA: Diagnosis not present

## 2023-06-26 DIAGNOSIS — R0989 Other specified symptoms and signs involving the circulatory and respiratory systems: Secondary | ICD-10-CM | POA: Diagnosis not present

## 2023-06-26 DIAGNOSIS — E1152 Type 2 diabetes mellitus with diabetic peripheral angiopathy with gangrene: Secondary | ICD-10-CM | POA: Diagnosis not present

## 2023-06-26 DIAGNOSIS — I5032 Chronic diastolic (congestive) heart failure: Secondary | ICD-10-CM | POA: Diagnosis not present

## 2023-06-26 DIAGNOSIS — I340 Nonrheumatic mitral (valve) insufficiency: Secondary | ICD-10-CM | POA: Diagnosis not present

## 2023-06-26 DIAGNOSIS — R590 Localized enlarged lymph nodes: Secondary | ICD-10-CM | POA: Diagnosis not present

## 2023-06-26 DIAGNOSIS — E872 Acidosis, unspecified: Secondary | ICD-10-CM | POA: Diagnosis not present

## 2023-06-26 DIAGNOSIS — R0600 Dyspnea, unspecified: Secondary | ICD-10-CM | POA: Diagnosis not present

## 2023-06-26 DIAGNOSIS — K7689 Other specified diseases of liver: Secondary | ICD-10-CM | POA: Diagnosis not present

## 2023-06-26 DIAGNOSIS — I5020 Unspecified systolic (congestive) heart failure: Secondary | ICD-10-CM | POA: Diagnosis not present

## 2023-06-26 DIAGNOSIS — F1510 Other stimulant abuse, uncomplicated: Secondary | ICD-10-CM | POA: Diagnosis not present

## 2023-06-26 HISTORY — DX: Shock, unspecified: R57.9

## 2023-06-26 HISTORY — DX: Sepsis, unspecified organism: A41.9

## 2023-06-26 LAB — COOXEMETRY PANEL
Carboxyhemoglobin: 1.7 % — ABNORMAL HIGH (ref 0.5–1.5)
Carboxyhemoglobin: 1.8 % — ABNORMAL HIGH (ref 0.5–1.5)
Methemoglobin: <0.7 % (ref 0.0–1.5)
Methemoglobin: <0.7 % (ref 0.0–1.5)
O2 Saturation: 53.7 %
O2 Saturation: 72.7 %
Total hemoglobin: 13.5 g/dL (ref 12.0–16.0)
Total hemoglobin: 13.6 g/dL (ref 12.0–16.0)

## 2023-06-26 LAB — BASIC METABOLIC PANEL
Anion gap: 16 — ABNORMAL HIGH (ref 5–15)
BUN: 77 mg/dL — ABNORMAL HIGH (ref 6–20)
CO2: 15 mmol/L — ABNORMAL LOW (ref 22–32)
Calcium: 6.9 mg/dL — ABNORMAL LOW (ref 8.9–10.3)
Chloride: 95 mmol/L — ABNORMAL LOW (ref 98–111)
Creatinine, Ser: 4.88 mg/dL — ABNORMAL HIGH (ref 0.61–1.24)
GFR, Estimated: 14 mL/min — ABNORMAL LOW (ref >60)
Glucose, Bld: 112 mg/dL — ABNORMAL HIGH (ref 70–99)
Potassium: 4.7 mmol/L (ref 3.5–5.1)
Sodium: 126 mmol/L — ABNORMAL LOW (ref 135–145)

## 2023-06-26 LAB — CBC
HCT: 41.8 % (ref 39.0–52.0)
Hemoglobin: 13.3 g/dL (ref 13.0–17.0)
MCH: 28.5 pg (ref 26.0–34.0)
MCHC: 31.8 g/dL (ref 30.0–36.0)
MCV: 89.7 fL (ref 80.0–100.0)
Platelets: 262 10*3/uL (ref 150–400)
RBC: 4.66 MIL/uL (ref 4.22–5.81)
RDW: 15.2 % (ref 11.5–15.5)
WBC: 22 10*3/uL — ABNORMAL HIGH (ref 4.0–10.5)
nRBC: 0.9 % — ABNORMAL HIGH (ref 0.0–0.2)

## 2023-06-26 LAB — LACTIC ACID, PLASMA
Lactic Acid, Venous: 2 mmol/L (ref 0.5–1.9)
Lactic Acid, Venous: 1.7 mmol/L (ref 0.5–1.9)

## 2023-06-26 LAB — RAPID URINE DRUG SCREEN, HOSP PERFORMED
Amphetamines: POSITIVE — AB
Barbiturates: NOT DETECTED
Benzodiazepines: NOT DETECTED
Cocaine: NOT DETECTED
Opiates: NOT DETECTED
Tetrahydrocannabinol: NOT DETECTED

## 2023-06-26 LAB — URINALYSIS, ROUTINE W REFLEX MICROSCOPIC
Bilirubin Urine: NEGATIVE
Glucose, UA: NEGATIVE mg/dL
Ketones, ur: NEGATIVE mg/dL
Nitrite: NEGATIVE
Protein, ur: 30 mg/dL — AB
Specific Gravity, Urine: 1.012 (ref 1.005–1.030)
pH: 5 (ref 5.0–8.0)

## 2023-06-26 LAB — MAGNESIUM: Magnesium: 1.6 mg/dL — ABNORMAL LOW (ref 1.7–2.4)

## 2023-06-26 LAB — COMPREHENSIVE METABOLIC PANEL
ALT: 188 U/L — ABNORMAL HIGH (ref 0–44)
AST: 384 U/L — ABNORMAL HIGH (ref 15–41)
Albumin: 1.5 g/dL — ABNORMAL LOW (ref 3.5–5.0)
Alkaline Phosphatase: 172 U/L — ABNORMAL HIGH (ref 38–126)
Anion gap: 14 (ref 5–15)
BUN: 78 mg/dL — ABNORMAL HIGH (ref 6–20)
CO2: 16 mmol/L — ABNORMAL LOW (ref 22–32)
Calcium: 6.3 mg/dL — CL (ref 8.9–10.3)
Chloride: 92 mmol/L — ABNORMAL LOW (ref 98–111)
Creatinine, Ser: 4.6 mg/dL — ABNORMAL HIGH (ref 0.61–1.24)
GFR, Estimated: 15 mL/min — ABNORMAL LOW (ref >60)
Glucose, Bld: 173 mg/dL — ABNORMAL HIGH (ref 70–99)
Potassium: 5.9 mmol/L — ABNORMAL HIGH (ref 3.5–5.1)
Sodium: 122 mmol/L — ABNORMAL LOW (ref 135–145)
Total Bilirubin: 1.8 mg/dL — ABNORMAL HIGH (ref <1.2)
Total Protein: 7.1 g/dL (ref 6.5–8.1)

## 2023-06-26 LAB — TROPONIN I (HIGH SENSITIVITY)
Troponin I (High Sensitivity): 218 ng/L (ref <18)
Troponin I (High Sensitivity): 208 ng/L (ref <18)

## 2023-06-26 LAB — CREATININE, URINE, RANDOM: Creatinine, Urine: 96 mg/dL

## 2023-06-26 LAB — SODIUM, URINE, RANDOM: Sodium, Ur: 29 mmol/L

## 2023-06-26 LAB — GLUCOSE, CAPILLARY
Glucose-Capillary: 123 mg/dL — ABNORMAL HIGH (ref 70–99)
Glucose-Capillary: 128 mg/dL — ABNORMAL HIGH (ref 70–99)
Glucose-Capillary: 135 mg/dL — ABNORMAL HIGH (ref 70–99)
Glucose-Capillary: 167 mg/dL — ABNORMAL HIGH (ref 70–99)
Glucose-Capillary: 175 mg/dL — ABNORMAL HIGH (ref 70–99)
Glucose-Capillary: 97 mg/dL (ref 70–99)

## 2023-06-26 LAB — HEMOGLOBIN A1C
Hgb A1c MFr Bld: 10.3 % — ABNORMAL HIGH (ref 4.8–5.6)
Mean Plasma Glucose: 248.91 mg/dL

## 2023-06-26 LAB — CORTISOL: Cortisol, Plasma: 30 ug/dL

## 2023-06-26 LAB — BRAIN NATRIURETIC PEPTIDE: B Natriuretic Peptide: 1991.5 pg/mL — ABNORMAL HIGH (ref 0.0–100.0)

## 2023-06-26 LAB — ETHANOL: Alcohol, Ethyl (B): <10 mg/dL (ref <10)

## 2023-06-26 LAB — PROCALCITONIN: Procalcitonin: 7.15 ng/mL

## 2023-06-26 LAB — I-STAT CG4 LACTIC ACID, ED: Lactic Acid, Venous: 2.5 mmol/L (ref 0.5–1.9)

## 2023-06-26 LAB — MRSA NEXT GEN BY PCR, NASAL: MRSA by PCR Next Gen: DETECTED — AB

## 2023-06-26 LAB — DIGOXIN LEVEL: Digoxin Level: <0.2 ng/mL — ABNORMAL LOW (ref 0.8–2.0)

## 2023-06-26 LAB — PHOSPHORUS: Phosphorus: 7.2 mg/dL — ABNORMAL HIGH (ref 2.5–4.6)

## 2023-06-26 MED ORDER — SODIUM CHLORIDE 0.9 % IV SOLN
2.0000 g | INTRAVENOUS | Status: DC
  Administered 2023-06-26 – 2023-06-28 (×3): 2 g via INTRAVENOUS
  Filled 2023-06-26 (×3): qty 12.5

## 2023-06-26 MED ORDER — FUROSEMIDE 10 MG/ML IJ SOLN
120.0000 mg | Freq: Two times a day (BID) | INTRAVENOUS | Status: DC
  Administered 2023-06-26 – 2023-06-27 (×3): 120 mg via INTRAVENOUS
  Filled 2023-06-26: qty 10
  Filled 2023-06-26: qty 2
  Filled 2023-06-26: qty 12
  Filled 2023-06-26: qty 10
  Filled 2023-06-26: qty 12

## 2023-06-26 MED ORDER — NOREPINEPHRINE 4 MG/250ML-% IV SOLN
0.0000 ug/min | INTRAVENOUS | Status: DC
  Administered 2023-06-26 (×2): 2 ug/min via INTRAVENOUS
  Administered 2023-06-26: 5 ug/min via INTRAVENOUS
  Filled 2023-06-26 (×3): qty 250

## 2023-06-26 MED ORDER — CALCIUM GLUCONATE-NACL 1-0.675 GM/50ML-% IV SOLN
1.0000 g | Freq: Once | INTRAVENOUS | Status: AC
  Administered 2023-06-26: 1000 mg via INTRAVENOUS
  Filled 2023-06-26: qty 50

## 2023-06-26 MED ORDER — INSULIN ASPART 100 UNIT/ML IV SOLN
5.0000 [IU] | Freq: Once | INTRAVENOUS | Status: AC
  Administered 2023-06-26: 5 [IU] via INTRAVENOUS

## 2023-06-26 MED ORDER — HEPARIN SODIUM (PORCINE) 5000 UNIT/ML IJ SOLN
5000.0000 [IU] | Freq: Three times a day (TID) | INTRAMUSCULAR | Status: DC
  Administered 2023-06-26 – 2023-07-01 (×15): 5000 [IU] via SUBCUTANEOUS
  Filled 2023-06-26 (×14): qty 1

## 2023-06-26 MED ORDER — LIDOCAINE HCL URETHRAL/MUCOSAL 2 % EX GEL
1.0000 | Freq: Once | CUTANEOUS | Status: AC
  Administered 2023-06-26: 1 via URETHRAL
  Filled 2023-06-26: qty 6

## 2023-06-26 MED ORDER — METRONIDAZOLE 500 MG/100ML IV SOLN
500.0000 mg | Freq: Two times a day (BID) | INTRAVENOUS | Status: DC
  Administered 2023-06-26 – 2023-06-28 (×5): 500 mg via INTRAVENOUS
  Filled 2023-06-26 (×5): qty 100

## 2023-06-26 MED ORDER — CHLORHEXIDINE GLUCONATE CLOTH 2 % EX PADS
6.0000 | MEDICATED_PAD | Freq: Every day | CUTANEOUS | Status: DC
  Administered 2023-06-26 – 2023-07-01 (×6): 6 via TOPICAL

## 2023-06-26 MED ORDER — INSULIN ASPART 100 UNIT/ML IJ SOLN
0.0000 [IU] | INTRAMUSCULAR | Status: DC
  Administered 2023-06-26: 2 [IU] via SUBCUTANEOUS
  Administered 2023-06-26: 3 [IU] via SUBCUTANEOUS
  Administered 2023-06-26 – 2023-06-27 (×6): 2 [IU] via SUBCUTANEOUS
  Administered 2023-06-27: 3 [IU] via SUBCUTANEOUS
  Administered 2023-06-27: 2 [IU] via SUBCUTANEOUS
  Administered 2023-06-28 (×3): 3 [IU] via SUBCUTANEOUS
  Administered 2023-06-28: 5 [IU] via SUBCUTANEOUS
  Administered 2023-06-28: 3 [IU] via SUBCUTANEOUS
  Administered 2023-06-29: 5 [IU] via SUBCUTANEOUS

## 2023-06-26 MED ORDER — VANCOMYCIN HCL 2000 MG/400ML IV SOLN
2000.0000 mg | Freq: Once | INTRAVENOUS | Status: AC
  Administered 2023-06-26: 2000 mg via INTRAVENOUS
  Filled 2023-06-26: qty 400

## 2023-06-26 MED ORDER — MAGNESIUM SULFATE 2 GM/50ML IV SOLN
2.0000 g | Freq: Once | INTRAVENOUS | Status: AC
  Administered 2023-06-26: 2 g via INTRAVENOUS
  Filled 2023-06-26: qty 50

## 2023-06-26 MED ORDER — FUROSEMIDE 10 MG/ML IJ SOLN: 80.0000 mg | Freq: Two times a day (BID) | INTRAMUSCULAR | Status: DC

## 2023-06-26 MED ORDER — DEXTROSE 50 % IV SOLN
1.0000 | Freq: Once | INTRAVENOUS | Status: AC
  Administered 2023-06-26: 50 mL via INTRAVENOUS
  Filled 2023-06-26: qty 50

## 2023-06-26 MED ORDER — ALBUTEROL SULFATE (2.5 MG/3ML) 0.083% IN NEBU
2.5000 mg | INHALATION_SOLUTION | Freq: Once | RESPIRATORY_TRACT | Status: AC
  Administered 2023-06-26: 2.5 mg via RESPIRATORY_TRACT
  Filled 2023-06-26: qty 3

## 2023-06-26 MED ORDER — FUROSEMIDE 10 MG/ML IJ SOLN
80.0000 mg | Freq: Once | INTRAMUSCULAR | Status: AC
  Administered 2023-06-26: 80 mg via INTRAVENOUS
  Filled 2023-06-26: qty 8

## 2023-06-26 MED ORDER — TRAMADOL HCL 50 MG PO TABS
50.0000 mg | ORAL_TABLET | Freq: Two times a day (BID) | ORAL | Status: AC | PRN
  Administered 2023-06-27 (×2): 50 mg via ORAL
  Filled 2023-06-26 (×2): qty 1

## 2023-06-26 MED ORDER — VANCOMYCIN VARIABLE DOSE PER UNSTABLE RENAL FUNCTION (PHARMACIST DOSING): Status: DC
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
## 2023-06-26 NOTE — Consult Note (Signed)
WOC Nurse Consult Note: Reason for Consult:Patient has a chronic necrotic ulcer on the left heel and right transmetatarsal amputation with nonintact lesion to right plantar foot near great toe head. .   Patient does not have palpable pulses in either lower extremity.  Noted mottled ischemic changes to both feet.   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  Surgical intervention not recommended at this time.  Wound type:ischemia Pressure Injury POA: NA Measurement:Left plantar heel:  4 cm x 3.5 cm x 0.2 cm  Right transmetatarsal amputation site with mottled ischemic changes along distal end of foot and nonintact lesion on plantar foot measuring 1.5 cm x 1 cm x 0.1 cm   Wound bed: Ruddy red nongranulating Drainage (amount, consistency, odor) minimal serosanguinous  no odor Periwound:ischemic changes  no palpable pulses in bilateral feet.  Dressing procedure/placement/frequency: Clean wounds to Left heel and right plantar foot near transmetatarsal amputation site with VASHE cleanser.  Apply VASHE moist gauze to wound bed.  Top with dry gauze and secure with kerlix and tape. Change daily.  Will not follow at this time.  Please re-consult if needed.  Mike Gip MSN, RN, FNP-BC CWON Wound, Ostomy, Continence Nurse Outpatient [facility] [phone] Pager [phone]

## 2023-06-26 NOTE — Progress Notes (Signed)
Pharmacy Electrolyte Replacement  Recent Labs:  Recent Labs    06/26/23 0300  K 4.7  MG 1.6*  PHOS 7.2*  CREATININE 4.88*    Low Critical Values (K </= 2.5, Phos </= 1, Mg </= 1) Present: None  MD Contacted: Dr. Wynona Neat  Plan:  Give Mag sulfate 2g IV x1

## 2023-06-26 NOTE — Progress Notes (Signed)
eLink Physician-Brief Progress Note Patient Name: Dakota Ramirez DOB: [birth year] MRN: [id number]   Date of Service  06/26/2023  HPI/Events of Note  Patient with a history of cardiomyopathy with systolic dysfunction (EF 20 %) admitted with concern for a combination of septic / cardiogenic shock secondary to lower extremity wounds with possible osteomyelitis, and poor compliance with his cardiac medications. Work up is in progress.  eICU Interventions  New Patient Evaluation.        Thomasene Lot Lakshya Mcgillicuddy 06/26/2023, 4:08 AM

## 2023-06-26 NOTE — Consult Note (Signed)
[facility]  INPATIENT CONSULTATION  Reason for Consultation: AKI Requesting Provider: Dr. Gaynell Face  HPI: Dakota Ramirez is an 52 y.o. male with HFrEF (last known 20%), DM, HTN, foot wounds currently admitted for shock and nephrology is consulted for evaluation and management of AKI.   Came into ED last night with dyspnea and scrotal edema.  Hypotensive 70/50s, afebrile.  Found to have Cr 4.6 up from normal 04/22/23. Lactate 2.6. WBC 22k, Hb 13.3, plt 262. Na 122, K 5.9, bicarb 16. Co ox 73.  Being treated for septic shock source foot wounds (see H&P photos) - vanc and cefepime.  CT c/a/p no contrast - fluid overload, cirrhosis, RP LN (26mo f/u scan rec).    Rec'd 1L LR in ED.  Then IV lasix. UOP yest, 280 today.   Only c/o being hungry this afternoon.  Apparently concern he had some amphetamines from a friend this afternoon and is somewhat sleepy.   PMH: Past Medical History:  Diagnosis Date   CHF (congestive heart failure) (HCC)    Diabetes mellitus without complication (HCC)    Type II   Heart failure with reduced ejection fraction (HCC) 11/26/2021   Hypertension    PSH: Past Surgical History:  Procedure Laterality Date   AMPUTATION Left 01/25/2023   Procedure: LEFT GREAT TOE AMPUTATION AND DEBRIDEMENT OF HEEL;  Surgeon: Nadara Mustard, MD;  Location: [facility] OR;  Service: Orthopedics;  Laterality: Left;   BACK SURGERY     CHOLECYSTECTOMY     RIGHT/LEFT HEART CATH AND CORONARY ANGIOGRAPHY N/A 11/30/2021   Procedure: RIGHT/LEFT HEART CATH AND CORONARY ANGIOGRAPHY;  Surgeon: Dolores Patty, MD;  Location: [facility] INVASIVE CV LAB;  Service: Cardiovascular;  Laterality: N/A;   TOE AMPUTATION Right    all toes on RT foot    Past Medical History:  Diagnosis Date   CHF (congestive heart failure) (HCC)    Diabetes mellitus without complication (HCC)    Type II   Heart failure with reduced ejection fraction (HCC) 11/26/2021   Hypertension     Medications:  I have  reviewed the patient's current medications.  Medications Prior to Admission  Medication Sig Dispense Refill   carvedilol (COREG) 6.25 MG tablet Take 1 tablet (6.25 mg total) by mouth 2 (two) times daily. 180 tablet 3   furosemide (LASIX) 40 MG tablet Take 2 tablets (80 mg total) by mouth daily. 60 tablet 11   ibuprofen (ADVIL) 800 MG tablet Take 800 mg by mouth every 8 (eight) hours as needed for mild pain (pain score 1-3).     NOVOLOG MIX 70/30 FLEXPEN (70-30) 100 UNIT/ML FlexPen INJECT 25 UNITS SUBCUTANEOUSLY TWICE DAILY WITH A MEAL (Patient taking differently: 30 Units 2 (two) times daily with a meal.) 15 mL 0   potassium chloride SA (KLOR-CON M) 20 MEQ tablet Take 2 tablets (40 mEq total) by mouth daily. 60 tablet 5   digoxin (LANOXIN) 0.125 MG tablet Take 1 tablet (0.125 mg total) by mouth daily. (Patient not taking: Reported on 06/26/2023) 30 tablet 3   sacubitril-valsartan (ENTRESTO) 97-103 MG Take 1 tablet by mouth 2 (two) times daily. (Patient not taking: Reported on 06/26/2023) 60 tablet 3   spironolactone (ALDACTONE) 25 MG tablet Take 1 tablet (25 mg total) by mouth daily. (Patient not taking: Reported on 06/26/2023) 30 tablet 3    ALLERGIES:  No Known Allergies  FAM HX: Family History  Problem Relation Age of Onset   Diabetes Mother    Hypertension Mother  Social History:   reports that he has never smoked. He has never used smokeless tobacco. He reports that he does not currently use alcohol. He reports that he does not use drugs.  ROS: 12 system ROS neg except per HPI  Blood pressure 94/62, pulse 95, temperature (!) 97.5 F (36.4 C), temperature source Bladder, resp. rate 14, height 6' (1.829 m), weight 120.2 kg, SpO2 (!) 85%. PHYSICAL EXAM: Gen: obese chronically ill appearing man arousable to shoulder shake  Eyes: anicteric ENT: poor dentition, MMM Neck: supple, JVD to mandible at 30 deg CV: RRR, no rrub Abd: soft, obese Lungs:  O2 sat 95% on 2L on RA sleeping  was 87% Extr: 2+ LE edema, LE dressings at ankles - reviewed photos from H&P Neuro: conversant, grossly nonfocal Skin: no rashes, various scabs and excoriations over arms, forehead, legs   Results for orders placed or performed during the hospital encounter of 06/25/23 (from the past 48 hour(s))  CBG monitoring, ED     Status: Abnormal   Collection Time: 06/25/23 10:34 PM  Result Value Ref Range   Glucose-Capillary 160 (H) 70 - 99 mg/dL    Comment: Glucose reference range applies only to samples taken after fasting for at least 8 hours.  Culture, blood (Routine X 2) w Reflex to ID Panel     Status: None (Preliminary result)   Collection Time: 06/25/23 10:58 PM   Specimen: BLOOD RIGHT HAND  Result Value Ref Range   Specimen Description BLOOD RIGHT HAND    Special Requests      BOTTLES DRAWN AEROBIC AND ANAEROBIC Blood Culture adequate volume   Culture      NO GROWTH < 12 HOURS Performed at [facility] Lab, [address] [place], [place] [address]    Report Status PENDING   Brain natriuretic peptide     Status: Abnormal   Collection Time: 06/25/23 11:00 PM  Result Value Ref Range   B Natriuretic Peptide 1,991.5 (H) 0.0 - 100.0 pg/mL    Comment: Performed at [facility], [address], [place], [place] [address]  CBC with Differential/Platelet     Status: Abnormal   Collection Time: 06/25/23 11:05 PM  Result Value Ref Range   WBC 18.9 (H) 4.0 - 10.5 K/uL   RBC 4.70 4.22 - 5.81 MIL/uL   Hemoglobin 13.4 13.0 - 17.0 g/dL   HCT 82.9 56.2 - 13.0 %   MCV 87.0 80.0 - 100.0 fL   MCH 28.5 26.0 - 34.0 pg   MCHC 32.8 30.0 - 36.0 g/dL   RDW 86.5 78.4 - 69.6 %   Platelets 351 150 - 400 K/uL   nRBC 0.6 (H) 0.0 - 0.2 %   Neutrophils Relative % 94 %   Neutro Abs 17.8 (H) 1.7 - 7.7 K/uL   Lymphocytes Relative 2 %   Lymphs Abs 0.4 (L) 0.7 - 4.0 K/uL   Monocytes Relative 4 %   Monocytes Absolute 0.8 0.1 - 1.0 K/uL   Eosinophils Relative 0 %   Eosinophils Absolute 0.0 0.0 - 0.5  K/uL   Basophils Relative 0 %   Basophils Absolute 0.0 0.0 - 0.1 K/uL   WBC Morphology See Note     Comment: Increased Bands. >20% Bands   nRBC 0 0 /100 WBC   Abs Immature Granulocytes 0.00 0.00 - 0.07 K/uL   Polychromasia MARKED     Comment: Performed at [facility] Lab, [address] [place], [place] [address]  Comprehensive metabolic  panel     Status: Abnormal   Collection Time: 06/25/23 11:05 PM  Result Value Ref Range   Sodium 122 (L) 135 - 145 mmol/L   Potassium 5.9 (H) 3.5 - 5.1 mmol/L    Comment: HEMOLYSIS AT THIS LEVEL MAY AFFECT RESULT   Chloride 92 (L) 98 - 111 mmol/L   CO2 16 (L) 22 - 32 mmol/L   Glucose, Bld 173 (H) 70 - 99 mg/dL    Comment: Glucose reference range applies only to samples taken after fasting for at least 8 hours.   BUN 78 (H) 6 - 20 mg/dL   Creatinine, Ser 4.09 (H) 0.61 - 1.24 mg/dL   Calcium 6.3 (LL) 8.9 - 10.3 mg/dL    Comment: CRITICAL RESULT CALLED TO, READ BACK BY AND VERIFIED WITH GIBSON, K. RN @ 0020 06/26/23 JBUTLER   Total Protein 7.1 6.5 - 8.1 g/dL   Albumin 1.5 (L) 3.5 - 5.0 g/dL   AST 811 (H) 15 - 41 U/L    Comment: HEMOLYSIS AT THIS LEVEL MAY AFFECT RESULT   ALT 188 (H) 0 - 44 U/L    Comment: HEMOLYSIS AT THIS LEVEL MAY AFFECT RESULT   Alkaline Phosphatase 172 (H) 38 - 126 U/L   Total Bilirubin 1.8 (H) <1.2 mg/dL    Comment: HEMOLYSIS AT THIS LEVEL MAY AFFECT RESULT   GFR, Estimated 15 (L) >60 mL/min    Comment: (NOTE) Calculated using the CKD-EPI Creatinine Equation (2021)    Anion gap 14 5 - 15    Comment: Performed at [facility], [address], [place], [place] [address]  Digoxin level     Status: Abnormal   Collection Time: 06/25/23 11:05 PM  Result Value Ref Range   Digoxin Level <0.2 (L) 0.8 - 2.0 ng/mL    Comment: RESULT CONFIRMED BY MANUAL DILUTION Performed at [facility], [address] [place], [place] [address]   Culture, blood (Routine X 2) w Reflex to ID Panel     Status: None (Preliminary  result)   Collection Time: 06/25/23 11:05 PM   Specimen: BLOOD RIGHT FOREARM  Result Value Ref Range   Specimen Description BLOOD RIGHT FOREARM    Special Requests      BOTTLES DRAWN AEROBIC ONLY Blood Culture adequate volume   Culture      NO GROWTH < 12 HOURS Performed at [facility], [address], [place], [place] [address]    Report Status PENDING   I-Stat CG4 Lactic Acid     Status: Abnormal   Collection Time: 06/25/23 11:13 PM  Result Value Ref Range   Lactic Acid, Venous 2.6 (HH) 0.5 - 1.9 mmol/L   Comment NOTIFIED PHYSICIAN   Troponin I (High Sensitivity)     Status: Abnormal   Collection Time: 06/26/23  1:00 AM  Result Value Ref Range   Troponin I (High Sensitivity) 218 (HH) <18 ng/L    Comment: CRITICAL RESULT CALLED TO, READ BACK BY AND VERIFIED WITH GIBSON, K. RN @ 06/26/23 JBUTLER (NOTE) Elevated high sensitivity troponin I (hsTnI) values and significant  changes across serial measurements may suggest ACS but many other  chronic and acute conditions are known to elevate hsTnI results.  Refer to the "Links" section for chest pain algorithms and additional  guidance. Performed at [facility], [address], [place], [place] [address]   I-Stat CG4 Lactic Acid     Status: Abnormal   Collection Time: 06/26/23  1:05 AM  Result Value Ref Range   Lactic Acid, Venous 2.5 (HH) 0.5 - 1.9 mmol/L   Comment NOTIFIED PHYSICIAN   CBC     Status: Abnormal   Collection Time: 06/26/23  3:00 AM  Result Value Ref Range   WBC 22.0 (H) 4.0 - 10.5 K/uL   RBC 4.66 4.22 - 5.81 MIL/uL   Hemoglobin 13.3 13.0 - 17.0 g/dL   HCT 78.4 69.6 - 29.5 %   MCV 89.7 80.0 - 100.0 fL   MCH 28.5 26.0 - 34.0 pg   MCHC 31.8 30.0 - 36.0 g/dL   RDW 28.4 13.2 - 44.0 %   Platelets 262 150 - 400 K/uL   nRBC 0.9 (H) 0.0 - 0.2 %    Comment: Performed at [facility], [address], [place], [place] [address]  Basic metabolic panel     Status: Abnormal   Collection Time: 06/26/23   3:00 AM  Result Value Ref Range   Sodium 126 (L) 135 - 145 mmol/L   Potassium 4.7 3.5 - 5.1 mmol/L   Chloride 95 (L) 98 - 111 mmol/L   CO2 15 (L) 22 - 32 mmol/L   Glucose, Bld 112 (H) 70 - 99 mg/dL    Comment: Glucose reference range applies only to samples taken after fasting for at least 8 hours.   BUN 77 (H) 6 - 20 mg/dL   Creatinine, Ser 5.36 (H) 0.61 - 1.24 mg/dL   Calcium 6.9 (L) 8.9 - 10.3 mg/dL   GFR, Estimated 14 (L) >60 mL/min    Comment: (NOTE) Calculated using the CKD-EPI Creatinine Equation (2021)    Anion gap 16 (H) 5 - 15    Comment: Performed at [facility], [address] [place], [place] [address]  Magnesium     Status: Abnormal   Collection Time: 06/26/23  3:00 AM  Result Value Ref Range   Magnesium 1.6 (L) 1.7 - 2.4 mg/dL    Comment: Performed at [facility], [address] [place], [place] [address]  Phosphorus     Status: Abnormal   Collection Time: 06/26/23  3:00 AM  Result Value Ref Range   Phosphorus 7.2 (H) 2.5 - 4.6 mg/dL    Comment: Performed at [facility], [address], [place], [place] [address]  Hemoglobin A1c     Status: Abnormal   Collection Time: 06/26/23  3:00 AM  Result Value Ref Range   Hgb A1c MFr Bld 10.3 (H) 4.8 - 5.6 %    Comment: (NOTE) Pre diabetes:          5.7%-6.4%  Diabetes:              >6.4%  Glycemic control for   <7.0% adults with diabetes    Mean Plasma Glucose 248.91 mg/dL    Comment: Performed at [facility], [address], [place], [place] [address]  Cortisol     Status: None   Collection Time: 06/26/23  3:00 AM  Result Value Ref Range   Cortisol, Plasma 30.0 ug/dL    Comment: (NOTE) AM    6.7 - 22.6 ug/dL PM   <32.9       ug/dL Performed at [facility], [address], [place], [place] [address]   Procalcitonin     Status: None   Collection Time: 06/26/23  3:00 AM  Result Value Ref Range   Procalcitonin 7.15 ng/mL    Comment:  Interpretation: PCT > 2  ng/mL: Systemic infection (sepsis) is likely, unless other causes are known. (NOTE)       Sepsis PCT Algorithm           Lower Respiratory Tract                                      Infection PCT Algorithm    ----------------------------     ----------------------------         PCT < 0.25 ng/mL                PCT < 0.10 ng/mL          Strongly encourage             Strongly discourage   discontinuation of antibiotics    initiation of antibiotics    ----------------------------     -----------------------------       PCT 0.25 - 0.50 ng/mL            PCT 0.10 - 0.25 ng/mL               OR       >80% decrease in PCT            Discourage initiation of                                            antibiotics      Encourage discontinuation           of antibiotics    ----------------------------     -----------------------------         PCT >= 0.50 ng/mL              PCT 0.26 - 0.50 ng/mL               AND       <80% decrease in PCT              Encourage initiation of                                             antibiotics       Encourage continuation           of antibiotics    ----------------------------     -----------------------------        PCT >= 0.50 ng/mL                  PCT > 0.50 ng/mL               AND         increase in PCT                  Strongly encourage                                      initiation of antibiotics    Strongly encourage escalation           of antibiotics                                     -----------------------------  PCT <= 0.25 ng/mL                                                 OR                                        > 80% decrease in PCT                                      Discontinue / Do not initiate                                             antibiotics  Performed at [facility], [address], [place], [place] [address]   Troponin I (High Sensitivity)     Status: Abnormal    Collection Time: 06/26/23  3:00 AM  Result Value Ref Range   Troponin I (High Sensitivity) 208 (HH) <18 ng/L    Comment: CRITICAL VALUE NOTED. VALUE IS CONSISTENT WITH PREVIOUSLY REPORTED/CALLED VALUE (NOTE) Elevated high sensitivity troponin I (hsTnI) values and significant  changes across serial measurements may suggest ACS but many other  chronic and acute conditions are known to elevate hsTnI results.  Refer to the "Links" section for chest pain algorithms and additional  guidance. Performed at [facility], [address] [place], [place] [address]   MRSA Next Gen by PCR, Nasal     Status: Abnormal   Collection Time: 06/26/23  3:12 AM   Specimen: Nasal Mucosa; Nasal Swab  Result Value Ref Range   MRSA by PCR Next Gen DETECTED (A) NOT DETECTED    Comment: RESULT CALLED TO, READ BACK BY AND VERIFIED WITH: B WARNER,RN@[id number]  06/26/23 MK (NOTE) The GeneXpert MRSA Assay (FDA approved for NASAL specimens only), is one component of a comprehensive MRSA colonization surveillance program. It is not intended to diagnose MRSA infection nor to guide or monitor treatment for MRSA infections. Test performance is not FDA approved in patients less than 53 years old. Performed at [facility], [address], [place], [place] [address]   Glucose, capillary     Status: Abnormal   Collection Time: 06/26/23  3:18 AM  Result Value Ref Range   Glucose-Capillary 123 (H) 70 - 99 mg/dL    Comment: Glucose reference range applies only to samples taken after fasting for at least 8 hours.  Ethanol     Status: None   Collection Time: 06/26/23  4:10 AM  Result Value Ref Range   Alcohol, Ethyl (B) <10 <10 mg/dL    Comment: (NOTE) Lowest detectable limit for serum alcohol is 10 mg/dL.  For medical purposes only. Performed at [facility], [address], [place], [place] [address]   Cooxemetry Panel (carboxy, met, total hgb, O2 sat)     Status: Abnormal   Collection Time: 06/26/23   4:10 AM  Result Value Ref Range   Total hemoglobin 13.5 12.0 - 16.0 g/dL   O2 Saturation 29.5 %   Carboxyhemoglobin 1.7 (H) 0.5 - 1.5 %  Methemoglobin <0.7 0.0 - 1.5 %    Comment: Performed at [facility], [address] [place], [place] [address]  Lactic acid, plasma     Status: Abnormal   Collection Time: 06/26/23  4:21 AM  Result Value Ref Range   Lactic Acid, Venous 2.0 (HH) 0.5 - 1.9 mmol/L    Comment: CRITICAL RESULT CALLED TO, READ BACK BY AND VERIFIED WITH POPE, H. RN @ 0530 06/26/23 JBUTLER Performed at [facility], [address] [place], [place] [address]   Glucose, capillary     Status: None   Collection Time: 06/26/23  7:52 AM  Result Value Ref Range   Glucose-Capillary 97 70 - 99 mg/dL    Comment: Glucose reference range applies only to samples taken after fasting for at least 8 hours.  Lactic acid, plasma     Status: None   Collection Time: 06/26/23  8:25 AM  Result Value Ref Range   Lactic Acid, Venous 1.7 0.5 - 1.9 mmol/L    Comment: Performed at [facility], [address], [place], [place] [address]  Rapid urine drug screen (hospital performed)     Status: Abnormal   Collection Time: 06/26/23  9:57 AM  Result Value Ref Range   Opiates NONE DETECTED NONE DETECTED   Cocaine NONE DETECTED NONE DETECTED   Benzodiazepines NONE DETECTED NONE DETECTED   Amphetamines POSITIVE (A) NONE DETECTED   Tetrahydrocannabinol NONE DETECTED NONE DETECTED   Barbiturates NONE DETECTED NONE DETECTED    Comment: (NOTE) DRUG SCREEN FOR MEDICAL PURPOSES ONLY.  IF CONFIRMATION IS NEEDED FOR ANY PURPOSE, NOTIFY LAB WITHIN 5 DAYS.  LOWEST DETECTABLE LIMITS FOR URINE DRUG SCREEN Drug Class                     Cutoff (ng/mL) Amphetamine and metabolites    1000 Barbiturate and metabolites    200 Benzodiazepine                 200 Opiates and metabolites        300 Cocaine and metabolites        300 THC                            50 Performed at [facility], [address] [place], [place] [address]   Urinalysis, Routine w reflex microscopic -Urine, Clean Catch     Status: Abnormal   Collection Time: 06/26/23  9:57 AM  Result Value Ref Range   Color, Urine AMBER (A) YELLOW    Comment: BIOCHEMICALS MAY BE AFFECTED BY COLOR   APPearance CLOUDY (A) CLEAR   Specific Gravity, Urine 1.012 1.005 - 1.030   pH 5.0 5.0 - 8.0   Glucose, UA NEGATIVE NEGATIVE mg/dL   Hgb urine dipstick SMALL (A) NEGATIVE   Bilirubin Urine NEGATIVE NEGATIVE   Ketones, ur NEGATIVE NEGATIVE mg/dL   Protein, ur 30 (A) NEGATIVE mg/dL   Nitrite NEGATIVE NEGATIVE   Leukocytes,Ua TRACE (A) NEGATIVE   RBC / HPF 6-10 0 - 5 RBC/hpf   WBC, UA 21-50 0 - 5 WBC/hpf   Bacteria, UA FEW (A) NONE SEEN   Squamous Epithelial / HPF 0-5 0 - 5 /HPF   WBC Clumps PRESENT    Mucus PRESENT    Hyaline Casts, UA PRESENT    Amorphous Crystal PRESENT    Ca Oxalate Crys, UA PRESENT    Sperm,  UA PRESENT     Comment: Performed at [facility], [address], [place], [place] [address]  Sodium, urine, random     Status: None   Collection Time: 06/26/23  9:57 AM  Result Value Ref Range   Sodium, Ur 29 mmol/L    Comment: Performed at [facility], [address], [place], [place] [address]  Creatinine, urine, random     Status: None   Collection Time: 06/26/23  9:57 AM  Result Value Ref Range   Creatinine, Urine 96 mg/dL    Comment: Performed at [facility], [address], [place], [place] [address]  Cooxemetry Panel (carboxy, met, total hgb, O2 sat)     Status: Abnormal   Collection Time: 06/26/23 10:06 AM  Result Value Ref Range   Total hemoglobin 13.6 12.0 - 16.0 g/dL   O2 Saturation 91.4 %   Carboxyhemoglobin 1.8 (H) 0.5 - 1.5 %   Methemoglobin <0.7 0.0 - 1.5 %    Comment: Performed at [facility], [address], [place], [place] [address]  Glucose, capillary     Status: Abnormal   Collection Time: 06/26/23 11:51 AM  Result Value Ref Range    Glucose-Capillary 128 (H) 70 - 99 mg/dL    Comment: Glucose reference range applies only to samples taken after fasting for at least 8 hours.  Glucose, capillary     Status: Abnormal   Collection Time: 06/26/23  3:10 PM  Result Value Ref Range   Glucose-Capillary 135 (H) 70 - 99 mg/dL    Comment: Glucose reference range applies only to samples taken after fasting for at least 8 hours.    DG CHEST PORT 1 VIEW  Result Date: 06/26/2023 CLINICAL DATA:  52 year old male status post central line placement. EXAM: PORTABLE CHEST 1 VIEW COMPARISON:  Chest x-ray 06/25/2023. FINDINGS: New left internal jugular central venous catheter with tip terminating in the distal superior vena cava. Lung volumes are low. No consolidative airspace disease. No pleural effusions. No pneumothorax. No pulmonary nodule or mass noted. Pulmonary vasculature appears normal. Heart size is mildly enlarged. The patient is rotated to the right on today's exam, resulting in distortion of the mediastinal contours and reduced diagnostic sensitivity and specificity for mediastinal pathology. Multiple old healed left-sided rib fractures are again incidentally noted. IMPRESSION: 1. Support apparatus, as above. 2. Cardiomegaly. Electronically Signed   By: Trudie Reed M.D.   On: 06/26/2023 05:57   CT CHEST ABDOMEN PELVIS WO CONTRAST  Result Date: 06/26/2023 CLINICAL DATA:  Shortness of breath, retaining fluid, mental status change EXAM: CT CHEST, ABDOMEN AND PELVIS WITHOUT CONTRAST TECHNIQUE: Multidetector CT imaging of the chest, abdomen and pelvis was performed following the standard protocol without IV contrast. RADIATION DOSE REDUCTION: This exam was performed according to the departmental dose-optimization program which includes automated exposure control, adjustment of the mA and/or kV according to patient size and/or use of iterative reconstruction technique. COMPARISON:  Chest radiograph 06/25/2023; abdominal ultrasound  12/27/2022 and CTA chest 11/26/2021 and CT abdomen and pelvis 05/05/2018 FINDINGS: CT CHEST FINDINGS Cardiovascular: Cardiomegaly. Small pericardial effusion. Coronary artery and aortic atherosclerotic calcification. Mediastinum/Nodes: Trachea and esophagus are unremarkable. No thoracic adenopathy. Lungs/Pleura: Bowing of the trachea consistent with expiratory phase exam. Mild bronchial wall thickening mucous plugging in the lower lobes. Small right pleural effusion. No pneumothorax. Musculoskeletal: Remote bilateral rib fractures.  No acute fracture. CT ABDOMEN PELVIS FINDINGS Hepatobiliary: Mild nodularity of the hepatic contour compatible with cirrhosis. Cholecystectomy.  No biliary dilation. Pancreas: Fatty atrophy.  No acute abnormality. Spleen: Unremarkable. Adrenals/Urinary Tract: Stable adrenal glands. Nonobstructing left nephrolithiasis, unchanged from 2019. No ureteral calculi or hydronephrosis. Unremarkable bladder. Stomach/Bowel: No bowel obstruction or bowel wall thickening. Sigmoid diverticulosis. Adjacent stranding and fluid is favored due to vascular congestion rather than diverticulitis though this is difficult to exclude. Stomach and appendix are within normal limits. Vascular/Lymphatic: Aortic atherosclerotic calcification. New retroperitoneal, bilateral iliac chain, and bilateral inguinal lymphadenopathy compared to 2019. For example 1.1 cm left periaortic node on series 4/image 77; 1.6 cm left iliac chain lymph node on series 4/image 117; and right iliac chain lymph node measuring 1.5 cm on series 4/image 114. Reproductive: Unremarkable. Other: Engorgement of the vasa recta with mesenteric edema in the central mesentery. Small volume abdominopelvic ascites greatest about the spleen. No free intraperitoneal air. Body wall anasarca. Musculoskeletal: No acute fracture. IMPRESSION: 1. Cardiomegaly.  Signs of fluid overload in the abdomen and pelvis. 2. Mild bronchial wall thickening and mucous  plugging in the lower lobes. 3. Nodular hepatic contour suggesting early cirrhosis. 4. New indeterminate retroperitoneal, bilateral iliac chain, and bilateral inguinal lymphadenopathy compared to 2019. Recommend clinical/laboratory correlation for lymphoproliferative disorder and follow-up CT in 3 months. 5. Sigmoid diverticulosis. Adjacent stranding and fluid is favored due to vascular congestion rather than diverticulitis. Aortic Atherosclerosis (ICD10-I70.0). Electronically Signed   By: Minerva Fester M.D.   On: 06/26/2023 04:09   CT Head Wo Contrast  Result Date: 06/26/2023 CLINICAL DATA:  Shortness of breath, retaining fluid, mental status change EXAM: CT HEAD WITHOUT CONTRAST TECHNIQUE: Contiguous axial images were obtained from the base of the skull through the vertex without intravenous contrast. RADIATION DOSE REDUCTION: This exam was performed according to the departmental dose-optimization program which includes automated exposure control, adjustment of the mA and/or kV according to patient size and/or use of iterative reconstruction technique. COMPARISON:  None Available. FINDINGS: Brain: No intracranial hemorrhage, mass effect, or evidence of acute infarct. No hydrocephalus. No extra-axial fluid collection. Vascular: No hyperdense vessel or unexpected calcification. Skull: No fracture or focal lesion. Sinuses/Orbits: No acute finding. Other: Soft tissue edema within the posterior scalp. IMPRESSION: No acute intracranial abnormality. Electronically Signed   By: Minerva Fester M.D.   On: 06/26/2023 03:51   DG Foot Complete Left  Result Date: 06/26/2023 CLINICAL DATA:  Wound on the plantar surface of the left foot near the calcaneus. EXAM: LEFT FOOT - COMPLETE 3+ VIEW COMPARISON:  None Available. FINDINGS: There is no evidence of an acute fracture or dislocation. 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 A 3.3 cm x 1.3 cm soft tissue defect is seen  involving the plantar aspect of the proximal left foot, adjacent to a moderate-sized plantar calcaneal spur. A 5 mm area of adjacent cortical erosion is noted. IMPRESSION: 1. Soft tissue defect involving the plantar aspect of the proximal left foot with findings suspicious for osteomyelitis. MRI correlation is recommended. 2. 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 Electronically Signed   By: Aram Candela M.D.   On: 06/26/2023 01:11   DG Chest Port 1 View  Result Date: 06/26/2023 CLINICAL DATA:  Shortness of breath with concern for sepsis. EXAM: PORTABLE CHEST 1 VIEW COMPARISON:  Dec 26, 2022 FINDINGS: The heart size and mediastinal contours are within normal limits. Low lung volumes are noted with mild atelectatic changes suspected within the bilateral lung bases. There is no evidence of  focal consolidation, pleural effusion or pneumothorax. Multiple chronic left-sided rib fractures are seen. The visualized skeletal structures are otherwise unremarkable. IMPRESSION: Low lung volumes with mild bibasilar atelectasis. Electronically Signed   By: Aram Candela M.D.   On: 06/26/2023 01:08    Assessment/PlanKyle O Thoresen is an 52 y.o. male with HFrEF (last known 20%), DM, HTN, foot wounds currently admitted for shock and nephrology is consulted for evaluation and management of AKI.   **Shock: septic +/- cardiogenic.  Pressors and inotropes per CCM.  Cardiology consulting repeat TTE pending.    **AKI: Normal baseline.  AKI not surprising in setting of shock - suspect ATN.  Renal US no obstruction.  UA 1+ protein and small blood, check UP/C.  Doubt new GN.  Follow course - no current indications for RRT but may develop in the next 24-48h.   **a/c HFrEF:  cardiology managing - CVP 17, assessing repeat coox for inotrope support.  Rec'd 80 lasix this AM, not good response, will redose 120 IV BID for now. Can add metolazone if needed.   **AMGA: in setting of AKI  and shock.  Mild, follow.    **Hyponatremia, hypervolemic:  diuresing.   **DM type 2: A1c 10.3. Per primary.   Will follow.  Tyler Pita 06/26/2023, 3:15 PM

## 2023-06-26 NOTE — ED Notes (Signed)
ED Provider at bedside. 
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
## 2023-06-26 NOTE — Progress Notes (Signed)
Called to see patient  Family member came in to visit patient and was passing an item to the patient  The nurse did inquire to see what was being passed back-and-forth and they refused, stating it was only a phone,   concern for drug use  Family member was requested to leave   Will order urine drug screen

## 2023-06-26 NOTE — ED Notes (Signed)
Admit provider at bedside 
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
## 2023-06-26 NOTE — Progress Notes (Addendum)
Pharmacy Antibiotic Note  MAHMOUD MOHAR is a 52 y.o. male admitted on 06/25/2023 with concern for sepsis and osteomyelitis.  Pharmacy has been consulted for vancomycin, cefepime, and metronidazole dosing.  Pt w/ AKI; current SCr 4.7, baseline <1.  Plan: Rec'd vancomycin load and one dose of Zosyn in ED. Start metronidazole 500mg  IV Q12H and cefepime 2g IV Q24H. Monitor SCr +/- vanc levels prior to redosing vanc.  Height: 6' (182.9 cm) Weight: 120.2 kg (265 lb) IBW/kg (Calculated) : 77.6  Temp (24hrs), Avg:97.6 F (36.4 C), Min:97.5 F (36.4 C), Max:97.6 F (36.4 C)  Recent Labs  Lab 06/25/23 2305 06/25/23 2313 06/26/23 0105 06/26/23 0300  WBC 18.9*  --   --  22.0*  CREATININE 4.60*  --   --  4.88*  LATICACIDVEN  --  2.6* 2.5*  --     Estimated Creatinine Clearance: 24 mL/min (A) (by C-G formula based on SCr of 4.88 mg/dL (H)).    No Known Allergies   Thank you for allowing pharmacy to be a part of this patient's care.  Vernard Gambles, PharmD, BCPS  06/26/2023 4:40 AM

## 2023-06-26 NOTE — Procedures (Signed)
Central Venous Catheter Insertion Procedure Note  Dakota Ramirez  [id number]  08-29-1970  Date:06/26/23  Time:4:11 AM   Provider Performing:Juwann Sherk Lacretia Nicks Mikey Bussing   Procedure: Insertion of Non-tunneled Central Venous [phone]) with US guidance ([id number])   Indication(s) Medication administration  Consent Risks of the procedure as well as the alternatives and risks of each were explained to the patient and/or caregiver.  Consent for the procedure was obtained and is signed in the bedside chart  Anesthesia Topical only with 1% lidocaine   Timeout Verified patient identification, verified procedure, site/side was marked, verified correct patient position, special equipment/implants available, medications/allergies/relevant history reviewed, required imaging and test results available.  Sterile Technique Maximal sterile technique including full sterile barrier drape, hand hygiene, sterile gown, sterile gloves, mask, hair covering, sterile ultrasound probe cover (if used).  Procedure Description Area of catheter insertion was cleaned with chlorhexidine and draped in sterile fashion.  With real-time ultrasound guidance a central venous catheter was placed into the left internal jugular vein. Nonpulsatile blood flow and easy flushing noted in all ports.  The catheter was sutured in place and sterile dressing applied.  Complications/Tolerance None; patient tolerated the procedure well. Chest X-ray is ordered to verify placement for internal jugular or subclavian cannulation.   Chest x-ray is not ordered for femoral cannulation.  EBL Minimal  Specimen(s) None       Joneen Roach, AGACNP-BC  Pulmonary & Critical Care  See Amion for personal pager PCCM on call pager [phone] until 7pm. Please call Elink 7p-7a. [phone]  06/26/2023 4:12 AM

## 2023-06-26 NOTE — Consult Note (Signed)
ORTHOPAEDIC CONSULTATION  REQUESTING PHYSICIAN: Briant Sites, DO  Chief Complaint: Bilateral foot pain.  HPI: Dakota Ramirez is a 52 y.o. male who presents with congestive heart failure with ejection fraction less than 20 who presents with cellulitis of the legs and trunk with cold ischemic feet.  Past Medical History:  Diagnosis Date   CHF (congestive heart failure) (HCC)    Diabetes mellitus without complication (HCC)    Type II   Heart failure with reduced ejection fraction (HCC) 11/26/2021   Hypertension    Past Surgical History:  Procedure Laterality Date   AMPUTATION Left 01/25/2023   Procedure: LEFT GREAT TOE AMPUTATION AND DEBRIDEMENT OF HEEL;  Surgeon: Nadara Mustard, MD;  Location: [facility] OR;  Service: Orthopedics;  Laterality: Left;   BACK SURGERY     CHOLECYSTECTOMY     RIGHT/LEFT HEART CATH AND CORONARY ANGIOGRAPHY N/A 11/30/2021   Procedure: RIGHT/LEFT HEART CATH AND CORONARY ANGIOGRAPHY;  Surgeon: Dolores Patty, MD;  Location: [facility] INVASIVE CV LAB;  Service: Cardiovascular;  Laterality: N/A;   TOE AMPUTATION Right    all toes on RT foot   Social History   Socioeconomic History   Marital status: Single    Spouse name: Not on file   Number of children: Not on file   Years of education: Not on file   Highest education level: Not on file  Occupational History   Not on file  Tobacco Use   Smoking status: Never   Smokeless tobacco: Never  Vaping Use   Vaping status: Never Used  Substance and Sexual Activity   Alcohol use: Not Currently    Comment: rare   Drug use: Never   Sexual activity: Not on file  Other Topics Concern   Not on file  Social History Narrative   Not on file   Social Determinants of Health   Financial Resource Strain: High Risk (01/10/2023)   Overall Financial Resource Strain (CARDIA)    Difficulty of Paying Living Expenses: Hard  Food Insecurity: Food Insecurity Present (12/28/2022)   Hunger Vital Sign    Worried About  Running Out of Food in the Last Year: Often true    Ran Out of Food in the Last Year: Often true  Transportation Needs: No Transportation Needs (11/29/2021)   PRAPARE - Administrator, Civil Service (Medical): No    Lack of Transportation (Non-Medical): No  Physical Activity: Not on file  Stress: No Stress Concern Present (02/28/2021)   Received from [facility], [facility] - Occupational Stress Questionnaire    Feeling of Stress : Not at all  Social Connections: Unknown (12/15/2021)   Received from [facility], [facility]   Social Network    Social Network: Not on file   Family History  Problem Relation Age of Onset   Diabetes Mother    Hypertension Mother    - negative except otherwise stated in the family history section No Known Allergies Prior to Admission medications   Medication Sig Start Date End Date Taking? Authorizing Provider  carvedilol (COREG) 6.25 MG tablet Take 1 tablet (6.25 mg total) by mouth 2 (two) times daily. 01/10/23  Yes Milford, Anderson Malta, FNP  furosemide (LASIX) 40 MG tablet Take 2 tablets (80 mg total) by mouth daily. 02/26/23  Yes Milford, Anderson Malta, FNP  ibuprofen (ADVIL) 800 MG tablet Take 800 mg by mouth every 8 (eight) hours as needed for mild pain (pain score  1-3).   Yes [provider]  NOVOLOG MIX 70/30 FLEXPEN (70-30) 100 UNIT/ML FlexPen INJECT 25 UNITS SUBCUTANEOUSLY TWICE DAILY WITH A MEAL Patient taking differently: 30 Units 2 (two) times daily with a meal. 05/23/23  Yes Annett Fabian, MD  potassium chloride SA (KLOR-CON M) 20 MEQ tablet Take 2 tablets (40 mEq total) by mouth daily. 02/25/23  Yes Milford, Anderson Malta, FNP  digoxin (LANOXIN) 0.125 MG tablet Take 1 tablet (0.125 mg total) by mouth daily. Patient not taking: Reported on 06/26/2023 12/12/22   Bensimhon, Bevelyn Buckles, MD  sacubitril-valsartan (ENTRESTO) 97-103 MG Take 1 tablet by mouth 2 (two) times daily. Patient not  taking: Reported on 06/26/2023 12/28/22   Gust Rung, DO  spironolactone (ALDACTONE) 25 MG tablet Take 1 tablet (25 mg total) by mouth daily. Patient not taking: Reported on 06/26/2023 12/12/22   Bensimhon, Bevelyn Buckles, MD   DG CHEST PORT 1 VIEW  Result Date: 06/26/2023 CLINICAL DATA:  52 year old male status post central line placement. EXAM: PORTABLE CHEST 1 VIEW COMPARISON:  Chest x-ray 06/25/2023. FINDINGS: New left internal jugular central venous catheter with tip terminating in the distal superior vena cava. Lung volumes are low. No consolidative airspace disease. No pleural effusions. No pneumothorax. No pulmonary nodule or mass noted. Pulmonary vasculature appears normal. Heart size is mildly enlarged. The patient is rotated to the right on today's exam, resulting in distortion of the mediastinal contours and reduced diagnostic sensitivity and specificity for mediastinal pathology. Multiple old healed left-sided rib fractures are again incidentally noted. IMPRESSION: 1. Support apparatus, as above. 2. Cardiomegaly. Electronically Signed   By: Trudie Reed M.D.   On: 06/26/2023 05:57   CT CHEST ABDOMEN PELVIS WO CONTRAST  Result Date: 06/26/2023 CLINICAL DATA:  Shortness of breath, retaining fluid, mental status change EXAM: CT CHEST, ABDOMEN AND PELVIS WITHOUT CONTRAST TECHNIQUE: Multidetector CT imaging of the chest, abdomen and pelvis was performed following the standard protocol without IV contrast. RADIATION DOSE REDUCTION: This exam was performed according to the departmental dose-optimization program which includes automated exposure control, adjustment of the mA and/or kV according to patient size and/or use of iterative reconstruction technique. COMPARISON:  Chest radiograph 06/25/2023; abdominal ultrasound 12/27/2022 and CTA chest 11/26/2021 and CT abdomen and pelvis 05/05/2018 FINDINGS: CT CHEST FINDINGS Cardiovascular: Cardiomegaly. Small pericardial effusion. Coronary artery and  aortic atherosclerotic calcification. Mediastinum/Nodes: Trachea and esophagus are unremarkable. No thoracic adenopathy. Lungs/Pleura: Bowing of the trachea consistent with expiratory phase exam. Mild bronchial wall thickening mucous plugging in the lower lobes. Small right pleural effusion. No pneumothorax. Musculoskeletal: Remote bilateral rib fractures.  No acute fracture. CT ABDOMEN PELVIS FINDINGS Hepatobiliary: Mild nodularity of the hepatic contour compatible with cirrhosis. Cholecystectomy. No biliary dilation. Pancreas: Fatty atrophy.  No acute abnormality. Spleen: Unremarkable. Adrenals/Urinary Tract: Stable adrenal glands. Nonobstructing left nephrolithiasis, unchanged from 2019. No ureteral calculi or hydronephrosis. Unremarkable bladder. Stomach/Bowel: No bowel obstruction or bowel wall thickening. Sigmoid diverticulosis. Adjacent stranding and fluid is favored due to vascular congestion rather than diverticulitis though this is difficult to exclude. Stomach and appendix are within normal limits. Vascular/Lymphatic: Aortic atherosclerotic calcification. New retroperitoneal, bilateral iliac chain, and bilateral inguinal lymphadenopathy compared to 2019. For example 1.1 cm left periaortic node on series 4/image 77; 1.6 cm left iliac chain lymph node on series 4/image 117; and right iliac chain lymph node measuring 1.5 cm on series 4/image 114. Reproductive: Unremarkable. Other: Engorgement of the vasa recta with mesenteric edema in the central mesentery. Small volume abdominopelvic ascites greatest  about the spleen. No free intraperitoneal air. Body wall anasarca. Musculoskeletal: No acute fracture. IMPRESSION: 1. Cardiomegaly.  Signs of fluid overload in the abdomen and pelvis. 2. Mild bronchial wall thickening and mucous plugging in the lower lobes. 3. Nodular hepatic contour suggesting early cirrhosis. 4. New indeterminate retroperitoneal, bilateral iliac chain, and bilateral inguinal lymphadenopathy  compared to 2019. Recommend clinical/laboratory correlation for lymphoproliferative disorder and follow-up CT in 3 months. 5. Sigmoid diverticulosis. Adjacent stranding and fluid is favored due to vascular congestion rather than diverticulitis. Aortic Atherosclerosis (ICD10-I70.0). Electronically Signed   By: Minerva Fester M.D.   On: 06/26/2023 04:09   CT Head Wo Contrast  Result Date: 06/26/2023 CLINICAL DATA:  Shortness of breath, retaining fluid, mental status change EXAM: CT HEAD WITHOUT CONTRAST TECHNIQUE: Contiguous axial images were obtained from the base of the skull through the vertex without intravenous contrast. RADIATION DOSE REDUCTION: This exam was performed according to the departmental dose-optimization program which includes automated exposure control, adjustment of the mA and/or kV according to patient size and/or use of iterative reconstruction technique. COMPARISON:  None Available. FINDINGS: Brain: No intracranial hemorrhage, mass effect, or evidence of acute infarct. No hydrocephalus. No extra-axial fluid collection. Vascular: No hyperdense vessel or unexpected calcification. Skull: No fracture or focal lesion. Sinuses/Orbits: No acute finding. Other: Soft tissue edema within the posterior scalp. IMPRESSION: No acute intracranial abnormality. Electronically Signed   By: Minerva Fester M.D.   On: 06/26/2023 03:51   DG Foot Complete Left  Result Date: 06/26/2023 CLINICAL DATA:  Wound on the plantar surface of the left foot near the calcaneus. EXAM: LEFT FOOT - COMPLETE 3+ VIEW COMPARISON:  None Available. FINDINGS: There is no evidence of an acute fracture or dislocation. 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 A 3.3 cm x 1.3 cm soft tissue defect is seen involving the plantar aspect of the proximal left foot, adjacent to a moderate-sized plantar calcaneal spur. A 5 mm area of adjacent cortical erosion is noted. IMPRESSION: 1. Soft  tissue defect involving the plantar aspect of the proximal left foot with findings suspicious for osteomyelitis. MRI correlation is recommended. 2. 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 Electronically Signed   By: Aram Candela M.D.   On: 06/26/2023 01:11   DG Chest Port 1 View  Result Date: 06/26/2023 CLINICAL DATA:  Shortness of breath with concern for sepsis. EXAM: PORTABLE CHEST 1 VIEW COMPARISON:  Dec 26, 2022 FINDINGS: The heart size and mediastinal contours are within normal limits. Low lung volumes are noted with mild atelectatic changes suspected within the bilateral lung bases. There is no evidence of focal consolidation, pleural effusion or pneumothorax. Multiple chronic left-sided rib fractures are seen. The visualized skeletal structures are otherwise unremarkable. IMPRESSION: Low lung volumes with mild bibasilar atelectasis. Electronically Signed   By: Aram Candela M.D.   On: 06/26/2023 01:08   - pertinent xrays, CT, MRI studies were reviewed and independently interpreted  Positive ROS: All other systems have been reviewed and were otherwise negative with the exception of those mentioned in the HPI and as above.  Physical Exam: General: Alert, no acute distress Psychiatric: Patient is competent for consent with normal mood and affect Lymphatic: No axillary or cervical lymphadenopathy Cardiovascular: No pedal edema Respiratory: No cyanosis, no use of accessory musculature GI: No organomegaly, abdomen is soft and non-tender    Images:  @ENCIMAGES @  Labs:  Lab Results  Component Value Date   HGBA1C 10.3 (H) 06/26/2023   HGBA1C 11.2 (H) 12/26/2022   HGBA1C 12.4 (H) 11/26/2021   ESRSEDRATE 68 (H) 12/28/2022   CRP 5.6 (H) 12/28/2022    Lab Results  Component Value Date   ALBUMIN 1.5 (L) 06/25/2023   ALBUMIN 2.3 (L) 04/22/2023   ALBUMIN 1.8 (L) 12/26/2022        Latest Ref Rng & Units 06/26/2023    3:00 AM  06/25/2023   11:05 PM 01/25/2023    8:34 AM  CBC EXTENDED  WBC 4.0 - 10.5 K/uL 22.0  18.9  8.0   RBC 4.22 - 5.81 MIL/uL 4.66  4.70  5.50   Hemoglobin 13.0 - 17.0 g/dL 82.9  56.2  13.0   HCT 39.0 - 52.0 % 41.8  40.9  46.7   Platelets 150 - 400 K/uL 262  351  212   NEUT# 1.7 - 7.7 K/uL  17.8    Lymph# 0.7 - 4.0 K/uL  0.4      Neurologic: Patient does not have protective sensation bilateral lower extremities.   MUSCULOSKELETAL:   Skin: Examination patient has induration and cellulitis involving the abdomen chest and back.  This induration cellulitis involves the thighs and calfs bilaterally.  Both feet are cold and ischemic and mottled.  Patient has a chronic necrotic ulcer on the left heel and induration papillomata's changes of the right transmetatarsal amputation.  Patient does not have palpable pulses in either lower extremity.  Patient's white cell count is 22,000 with a hemoglobin of 13.  Albumin 1.5.  Patient's hemoglobin A1c has consistently been above 10.  Radiographs of the left foot shows a chronic left heel ulcer with chronic osteomyelitis of the calcaneus.  Maximum core body temperature 97 with hypotension.  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  Plan: Plan: Recommend continue medical treatment for sepsis and hypotension.  No surgical intervention indicated for either lower extremity at this time.  Does not appear that patient's systemic symptoms are originating from the feet.  Thank you for the consult and the opportunity to see Mr. Dakota Trayer, MD [facility] [phone] 7:00 AM

## 2023-06-26 NOTE — Progress Notes (Signed)
PCCM INTERVAL PROGRESS NOTE  CVL placed CVP 25 Coox pending  POCUS with reduced EF as expected but RV looks dilated.  IVC diameter 3cm with no collapsibility with sniff.    Plan:  Will add lasix 80mg . Had strong suspicion for cardiogenic shock, which has been strengthened by POCUS echo and CVP 25. May need inotrope. Currently uptitrating norepi for MAP 65 and SBP 90. ( )   Joneen Roach, AGACNP-BC [facility]  See Amion for personal pager PCCM on call pager [phone] until 7pm. Please call Elink 7p-7a. [phone]  06/26/2023 4:12 AM
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
## 2023-06-26 NOTE — Progress Notes (Signed)
ED Pharmacy Antibiotic Sign Off An antibiotic consult was received from an ED provider for vancomycin per pharmacy dosing for wound infection. A chart review was completed to assess appropriateness.   The following one time order(s) were placed:  Vancomycin 2g  Further antibiotic and/or antibiotic pharmacy consults should be ordered by the admitting provider if indicated.   Thank you for allowing pharmacy to be a part of this patient's care.   Marja Kays, [facility]  Clinical Pharmacist 06/26/23 12:19 AM

## 2023-06-26 NOTE — Progress Notes (Signed)
Heart Failure Navigator Progress Note  Assessed for Heart & Vascular TOC clinic readiness.  Patient does not meet criteria due to Advanced Heart Failure Team patient of Dr. Bensimhon.   Navigator will sign off at this time.   Lucilia Yanni, BSN, RN Heart Failure Nurse Navigator Secure Chat Only   

## 2023-06-26 NOTE — TOC Initial Note (Signed)
Transition of Care [facility]) - Initial/Assessment Note    Patient Details  Name: Dakota Ramirez MRN: [id number] Date of Birth: 03/29/1971  Transition of Care [facility]) CM/SW Contact:    Nicanor Bake Phone Number: [phone] 06/26/2023, 12:25 PM  Clinical Narrative:    HF CSW met with pt at bedside. Pt stated that he lives alone with his pet dogs. Pt stated that he drives. Pt stated that he has a history of HH services, but could not remember the name of the of the company providing the services. Pt stated that he uses a walking stick. Pt stated that he does not use any equipment. Pt stated that he has a scale at home. Pt stated that he does have a PCP but has never been to the one on file and open to trying a new PCP. CSW will schedule a follow up hospital appointment closer to dc.   TOC will continue following.             Expected Discharge Plan: Home w Home Health Services Barriers to Discharge: Continued Medical Work up   Patient Goals and CMS Choice            Expected Discharge Plan and Services       Living arrangements for the past 2 months: Mobile Home                                      Prior Living Arrangements/Services Living arrangements for the past 2 months: Mobile Home Lives with:: Self, Pets Patient language and need for interpreter reviewed:: Yes Do you feel safe going back to the place where you live?: Yes      Need for Family Participation in Patient Care: No (Comment) Care giver support system in place?: No (comment)   Criminal Activity/Legal Involvement Pertinent to Current Situation/Hospitalization: No - Comment as needed  Activities of Daily Living      Permission Sought/Granted                  Emotional Assessment Appearance:: Appears older than stated age Attitude/Demeanor/Rapport: Engaged Affect (typically observed): Appropriate Orientation: : Oriented to Self, Oriented to Place, Oriented to  Time, Oriented to  Situation Alcohol / Substance Use: Not Applicable Psych Involvement: No (comment)  Admission diagnosis:  Shock (HCC) [R57.9] AKI (acute kidney injury) (HCC) [N17.9] Sepsis with encephalopathy and septic shock, due to unspecified organism (HCC) [A41.9, R65.21, G93.41] Patient Active Problem List   Diagnosis Date Noted   Shock (HCC) 06/26/2023   AKI (acute kidney injury) (HCC) 06/26/2023   Hyperkalemia 06/26/2023   Gangrene of both feet (HCC) 06/26/2023   Sepsis with encephalopathy and septic shock (HCC) 06/26/2023   Osteomyelitis of great toe of right foot (HCC) 01/25/2023   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 01/10/2023   Diabetic osteomyelitis (HCC) 12/28/2022   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 to breakdown of skin (HCC) 12/28/2022   Severe protein-calorie malnutrition (HCC) 12/28/2022   Acute on chronic heart failure with reduced ejection fraction (HFrEF, <= 40%) (HCC) 12/28/2022   Elevated alkaline phosphatase level 12/27/2022   Acute on chronic heart failure (HCC) 12/26/2022   Hyponatremia 11/29/2021   PVD (peripheral vascular disease) (HCC) 11/29/2021   Class 2 obesity 11/29/2021   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 [place] [address] Phone: [phone] Fax: [phone]  [facility] [address] [place]  [place] [address] Phone: [phone] Fax: [phone]     Social Determinants of Health (SDOH) Social History: SDOH Screenings   Food Insecurity: Food Insecurity Present (12/28/2022)  Housing: Patient Declined (12/28/2022)  Transportation Needs: No Transportation Needs (11/29/2021)  Utilities: Not At Risk (12/28/2022)  Depression (PHQ2-9): Low Risk  (01/17/2023)  Recent Concern: Depression (PHQ2-9) - Medium Risk (01/10/2023)  Financial Resource Strain: High Risk (01/10/2023)  Social Connections: Unknown (12/15/2021)   Received from [facility]  Stress: No Stress Concern Present (02/28/2021)   Received from [facility], [facility]  Tobacco Use: Low Risk  (06/25/2023)   SDOH Interventions:     Readmission Risk Interventions     No data to display

## 2023-06-27 ENCOUNTER — Inpatient Hospital Stay (HOSPITAL_COMMUNITY): Payer: 59

## 2023-06-27 DIAGNOSIS — R579 Shock, unspecified: Secondary | ICD-10-CM | POA: Diagnosis not present

## 2023-06-27 DIAGNOSIS — I5023 Acute on chronic systolic (congestive) heart failure: Secondary | ICD-10-CM

## 2023-06-27 DIAGNOSIS — R6521 Severe sepsis with septic shock: Secondary | ICD-10-CM | POA: Diagnosis not present

## 2023-06-27 LAB — CBC WITH DIFFERENTIAL/PLATELET
Abs Immature Granulocytes: 0 10*3/uL (ref 0.00–0.07)
Basophils Absolute: 0 10*3/uL (ref 0.0–0.1)
Basophils Relative: 0 %
Eosinophils Absolute: 0.2 10*3/uL (ref 0.0–0.5)
Eosinophils Relative: 1 %
HCT: 39.4 % (ref 39.0–52.0)
Hemoglobin: 12.9 g/dL — ABNORMAL LOW (ref 13.0–17.0)
Lymphocytes Relative: 0 %
Lymphs Abs: 0 10*3/uL — ABNORMAL LOW (ref 0.7–4.0)
MCH: 27.9 pg (ref 26.0–34.0)
MCHC: 32.7 g/dL (ref 30.0–36.0)
MCV: 85.3 fL (ref 80.0–100.0)
Monocytes Absolute: 0.2 10*3/uL (ref 0.1–1.0)
Monocytes Relative: 1 %
Neutro Abs: 21.2 10*3/uL — ABNORMAL HIGH (ref 1.7–7.7)
Neutrophils Relative %: 98 %
Platelets: 207 10*3/uL (ref 150–400)
RBC: 4.62 MIL/uL (ref 4.22–5.81)
RDW: 15 % (ref 11.5–15.5)
WBC: 21.6 10*3/uL — ABNORMAL HIGH (ref 4.0–10.5)
nRBC: 0.8 % — ABNORMAL HIGH (ref 0.0–0.2)
nRBC: 1 /100{WBCs} — ABNORMAL HIGH

## 2023-06-27 LAB — GLUCOSE, CAPILLARY
Glucose-Capillary: 134 mg/dL — ABNORMAL HIGH (ref 70–99)
Glucose-Capillary: 141 mg/dL — ABNORMAL HIGH (ref 70–99)
Glucose-Capillary: 136 mg/dL — ABNORMAL HIGH (ref 70–99)
Glucose-Capillary: 148 mg/dL — ABNORMAL HIGH (ref 70–99)
Glucose-Capillary: 137 mg/dL — ABNORMAL HIGH (ref 70–99)

## 2023-06-27 LAB — COMPREHENSIVE METABOLIC PANEL
ALT: 189 U/L — ABNORMAL HIGH (ref 0–44)
AST: 318 U/L — ABNORMAL HIGH (ref 15–41)
Albumin: <1.5 g/dL — ABNORMAL LOW (ref 3.5–5.0)
Alkaline Phosphatase: 174 U/L — ABNORMAL HIGH (ref 38–126)
Anion gap: 14 (ref 5–15)
BUN: 84 mg/dL — ABNORMAL HIGH (ref 6–20)
CO2: 18 mmol/L — ABNORMAL LOW (ref 22–32)
Calcium: 6.3 mg/dL — CL (ref 8.9–10.3)
Chloride: 92 mmol/L — ABNORMAL LOW (ref 98–111)
Creatinine, Ser: 4.84 mg/dL — ABNORMAL HIGH (ref 0.61–1.24)
GFR, Estimated: 14 mL/min — ABNORMAL LOW (ref >60)
Glucose, Bld: 138 mg/dL — ABNORMAL HIGH (ref 70–99)
Potassium: 3.7 mmol/L (ref 3.5–5.1)
Sodium: 124 mmol/L — ABNORMAL LOW (ref 135–145)
Total Bilirubin: 1.5 mg/dL — ABNORMAL HIGH (ref <1.2)
Total Protein: 6.8 g/dL (ref 6.5–8.1)

## 2023-06-27 LAB — COOXEMETRY PANEL
Carboxyhemoglobin: 1.8 % — ABNORMAL HIGH (ref 0.5–1.5)
Methemoglobin: <0.7 % (ref 0.0–1.5)
O2 Saturation: 68.4 %
Total hemoglobin: 13.5 g/dL (ref 12.0–16.0)

## 2023-06-27 LAB — BLOOD CULTURE ID PANEL (REFLEXED) - BCID2

## 2023-06-27 LAB — MAGNESIUM: Magnesium: 2 mg/dL (ref 1.7–2.4)

## 2023-06-27 LAB — ECHOCARDIOGRAM COMPLETE
AR max vel: 1.91 cm2
AV Area VTI: 1.77 cm2
AV Area mean vel: 1.64 cm2
AV Mean grad: 3 mm[Hg]
AV Peak grad: 4.8 mm[Hg]
Ao pk vel: 1.09 m/s
Area-P 1/2: 5.31 cm2
Height: 72 in
MV M vel: 4.38 m/s
MV Peak grad: 76.7 mm[Hg]
Radius: 0.6 cm
S' Lateral: 5.5 cm
Weight: 4701.97 [oz_av]

## 2023-06-27 MED ORDER — CALCIUM GLUCONATE-NACL 1-0.675 GM/50ML-% IV SOLN
1.0000 g | Freq: Once | INTRAVENOUS | Status: AC
  Administered 2023-06-27: 1000 mg via INTRAVENOUS
  Filled 2023-06-27: qty 50

## 2023-06-27 MED ORDER — POTASSIUM CHLORIDE CRYS ER 20 MEQ PO TBCR
40.0000 meq | EXTENDED_RELEASE_TABLET | Freq: Once | ORAL | Status: AC
  Administered 2023-06-27: 40 meq via ORAL
  Filled 2023-06-27: qty 2

## 2023-06-27 NOTE — Progress Notes (Addendum)
Advanced Heart Failure Rounding Note  PCP-Cardiologist: None   Subjective:    Admitted w/ mixed septic and cardiogenic shock.  Off NE this morning. Remains on abx. Afebrile but WBC still rising, 22K. BCx NGTD   LA cleared 2.6>>1.7.  Co-ox 68% today.  Starting to diurese w/ IV Lasix, net negative 2L yesterday. SCr unchanged, 4.8 today. BUN trending up, 77>>84.   Serum Na 124  Urine Na 29 yesterday (prior to diuretic dose increase)    Resting comfortably. No respiratory difficulty. Denies resting dyspnea. Getting echo done currently.   Objective:   Weight Range: 133.3 kg Body mass index is 39.86 kg/m.   Vital Signs:   Temp:  [97 F (36.1 C)-98.2 F (36.8 C)] 98 F (36.7 C) (11/14 0731) Pulse Rate:  [84-155] 84 (11/14 0800) Resp:  [12-23] 17 (11/14 0800) BP: (78-117)/(28-100) 93/69 (11/14 0800) SpO2:  [68 %-100 %] 100 % (11/14 0800) Weight:  [133.3 kg] 133.3 kg (11/14 0434) Last BM Date : 06/26/23  Weight change: Filed Weights   06/25/23 2228 06/27/23 0434  Weight: 120.2 kg 133.3 kg    Intake/Output:   Intake/Output Summary (Last 24 hours) at 06/27/2023 0926 Last data filed at 06/27/2023 0800 Gross per 24 hour  Intake 945.64 ml  Output 2710 ml  Net -1764.36 ml      Physical Exam   General:  chronically ill appearing, unkept. No resp difficulty HEENT: Normal Neck: Supple. JVP elevated to jaw. + LIJ CVC Carotids 2+ bilat; no bruits. No lymphadenopathy or thyromegaly appreciated. Cor: PMI nondisplaced. Regular rate & rhythm. No rubs, gallops or murmurs. Lungs: decreased BS at the bases  Abdomen: Soft, nontender, +distended. + abdominal wall edema, No hepatosplenomegaly. No bruits or masses. Good bowel sounds. Extremities: No cyanosis, clubbing, rash, 2-3+ b/l LE edema up to thighs, chronic b/l LE wounds/ulcers w/ overlying cellulitis, Lt TMA. Left foot wrapped  Neuro: Alert & orientedx3, cranial nerves grossly intact. moves all 4 extremities w/o  difficulty. Affect flat    Telemetry   NSR 80s   EKG    N/A   Labs    CBC Recent Labs    06/25/23 2305 06/26/23 0300 06/27/23 0209  WBC 18.9* 22.0* 21.6*  NEUTROABS 17.8*  --  21.2*  HGB 13.4 13.3 12.9*  HCT 40.9 41.8 39.4  MCV 87.0 89.7 85.3  PLT 351 262 207   Basic Metabolic Panel Recent Labs    16/10/96 0300 06/27/23 0209  NA 126* 124*  K 4.7 3.7  CL 95* 92*  CO2 15* 18*  GLUCOSE 112* 138*  BUN 77* 84*  CREATININE 4.88* 4.84*  CALCIUM 6.9* 6.3*  MG 1.6* 2.0  PHOS 7.2*  --    Liver Function Tests Recent Labs    06/25/23 2305 06/27/23 0209  AST 384* 318*  ALT 188* 189*  ALKPHOS 172* 174*  BILITOT 1.8* 1.5*  PROT 7.1 6.8  ALBUMIN 1.5* <1.5*   No results for input(s): "LIPASE", "AMYLASE" in the last 72 hours. Cardiac Enzymes No results for input(s): "CKTOTAL", "CKMB", "CKMBINDEX", "TROPONINI" in the last 72 hours.  BNP: BNP (last 3 results) Recent Labs    02/25/23 1418 04/22/23 1420 06/25/23 2300  BNP 1,019.9* 839.3* 1,991.5*    ProBNP (last 3 results) No results for input(s): "PROBNP" in the last 8760 hours.   D-Dimer No results for input(s): "DDIMER" in the last 72 hours. Hemoglobin A1C Recent Labs    06/26/23 0300  HGBA1C 10.3*   Fasting Lipid Panel No  results for input(s): "CHOL", "HDL", "LDLCALC", "TRIG", "CHOLHDL", "LDLDIRECT" in the last 72 hours. Thyroid Function Tests No results for input(s): "TSH", "T4TOTAL", "T3FREE", "THYROIDAB" in the last 72 hours.  Invalid input(s): "FREET3"  Other results:   Imaging    US RENAL  Result Date: 06/26/2023 CLINICAL DATA:  Acute kidney injury. EXAM: RENAL / URINARY TRACT ULTRASOUND COMPLETE COMPARISON:  CT chest, abdomen, and pelvis 06/26/2023. FINDINGS: Right Kidney: Renal measurements: 13.0 x 7.4 x 6.7 cm = volume: 338 mL. Mildly increased parenchymal echogenicity. No mass or hydronephrosis visualized. Left Kidney: Renal measurements: 13.2 x 6.8 x 7.2 cm = volume: 338 mL.  Borderline to mildly increased parenchymal echogenicity. No mass or hydronephrosis visualized. Bladder: Decompressed with a Foley catheter in place. Other: None. IMPRESSION: 1. No hydronephrosis. 2. Mildly echogenic kidneys which can be seen with medical renal disease. Electronically Signed   By: Sebastian Ache M.D.   On: 06/26/2023 15:34     Medications:     Scheduled Medications:  Chlorhexidine Gluconate Cloth  6 each Topical Q0600   heparin  5,000 Units Subcutaneous Q8H   insulin aspart  0-15 Units Subcutaneous Q4H   vancomycin variable dose per unstable renal function (pharmacist dosing)   Does not apply See admin instructions    Infusions:  ceFEPime (MAXIPIME) IV Stopped (06/27/23 0612)   furosemide 62 mL/hr at 06/27/23 0800   metronidazole Stopped (06/27/23 0722)   norepinephrine (LEVOPHED) Adult infusion Stopped (06/27/23 0342)    PRN Medications: traMADol    Patient Profile   52 y/o male w/ chronic systolic heart failure w/ severe biventricular dysfunction due to NICM, uncontrolled T2DM, chronic LE wounds/ chronic osteomyelitis s/p left toe amputation, rt TMA and poor compliance, now admitted w/ acute on chronic systolic heart failure and mixed septic and cardiogenic shock in setting of recurrent foot infection.   Assessment/Plan   1. Acute on Chronic Systolic Heart Failure w/ Cardiogenic Shock   - NICM. Reports hx CHF in his mother. No hx alcohol or drug abuse. 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 RVEF 22% LGE imaging very poor quality, which limits interpretation. Appears to have RV insertion site LGE (associated with worse prognosis) - Echo (5/24): EF < 20%, LV with GHK, RV mildly  reduced, LA/RA sev dilated, mild-mod MR, mild TR, trivial pericardial effusion.  - Now admitted w/ marked volume overload and CGS, c/b septic shock. Initial co-ox 53%. Placed on NE - off NE this morning. Co-ox stable 68%. LA cleared 2.6>1.7  - c/w marked volume overload. Starting to diurese w/ high dose IV Lasix 120 mg bid. Serum sodium 124, UNa 29. Nephrology following - continue IV Lasix 120 mg bid. ? Addition of metolazone  - repeat echo ordered  - GDMT limited by AKI and soft BP  - not candidate for advanced therapies given noncompliance and commodities including chronic LE wounds and uncontrolled DM    2. Septic Shock  - source = foot wound/ osteomyelitis - on vanc + cefepime + flagyl  - BCx NGTD - ? Obtaining wound cx to identify organism to guide tx  - Ortho following  - NE for BP support, keep MAP > 65 - LA cleared, Cont to trend PCT  3. Chronic Foots Wounds/ Osteomyelitis w/ Overlying Cellulitis  - management per ortho and WOC  - abx per above    4. AKI - 2/2 mixed shock/hypotension  - SCr 4.88 (b/l 0.8) - SCr remains elevated, 4.8 today but diuresing w/ IV Lasix.  - nephrology following    5. Shock Liver/ Hepatic Congestion  - AST 384>>318  - ALT 188>>189   - Tbili 1.8>>1.5  - continue diuresis per above - co-ox now stable off support  - follow trends    6. Poorly Controlled T2DM w/ Complications - Hgb A1c 10.3  - insulin per CCM    7. Poor Med Compliance - [facility]. Qualifies for HF paramedicine but has previously refused     Length of Stay: 1  Brittainy Simmons, PA-C  06/27/2023, 9:26 AM  Advanced Heart Failure Team Pager [phone] (M-F; 7a - 5p)  Please contact [facility] for night-coverage after hours (5p -7a ) and weekends on [email or web address]  Agree with above.   Now off NE. Co-ox and BP stable. Scr remains 4.8 but diuresing profusely.   Denies CP or SOB. On IV abx  General:  Chronically-ill appearing. No resp difficulty HEENT: normal Neck:  supple. JVP to jaw  Carotids 2+ bilat; no bruits. No lymphadenopathy or thryomegaly appreciated. Cor: . Regular rate & rhythm. No rubs, gallops or murmurs. Lungs: clear Abdomen: soft, nontender, nondistended. No hepatosplenomegaly. No bruits or masses. Good bowel sounds. Extremities: no cyanosis, clubbing, rash, 3-4+ edema with cellulitis. LEs wounds wrapped but with some drainage Neuro: alert & orientedx3, cranial nerves grossly intact. moves all 4 extremities w/o difficulty. Affect pleasant  Remains tenuous but improving. Co-ox stable off NE. Markedly fluid overloaded. Continue IV lasix. Suspect SCr will come back down soon.   Can add back GDMT slowly as tolerated.  Management of sepsis and LE wounds per primary team.   Arvilla Meres, MD  5:54 PM

## 2023-06-27 NOTE — Progress Notes (Signed)
eLink Physician-Brief Progress Note Patient Name: Dakota Ramirez DOB: 01-01-1971 MRN: [id number]   Date of Service  06/27/2023  HPI/Events of Note  Corrected Calcium 8.3 gm / dl.  eICU Interventions  Calcium gluconate 1 gm iv ordered.        Sanika Brosious U Lonzy Mato 06/27/2023, 4:20 AM

## 2023-06-27 NOTE — Progress Notes (Signed)
Physical Therapy Treatment Patient Details Name: Dakota Ramirez MRN: [id number] DOB: 07/21/1971 Today's Date: 06/27/2023   History of Present Illness Patient is a 52 y/o male admitted 06/26/23 with SOB and scrotal edema.  Found to be hypotensive with AKI and concern for sepsis with foot wounds.  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    PT Comments  Making progress towards acute functional goals. Mainly limited by pain from scrotal edema, but improved with application of scrotal sling made by OT today. Required min assist +2 for bed mobility, sit to stand transfer, and CGA +2 for step pivot to recliner. Reviewed LE exercises. Slight drop in BP with mobility, but rebounds in recliner; otherwise VSS. Patient will continue to benefit from skilled physical therapy services to further improve independence with functional mobility.    If plan is discharge home, recommend the following: A lot of help with walking and/or transfers;A lot of help with bathing/dressing/bathroom;Assistance with cooking/housework;Assist for transportation;Help with stairs or ramp for entrance   Can travel by private vehicle        Equipment Recommendations  Rollator (4 wheels) (TBA)    Recommendations for Other Services       Precautions / Restrictions Precautions Precautions: Fall Precaution Comments: R transmet amp; scrotal edema, watch BP Restrictions Weight Bearing Restrictions: No     Mobility  Bed Mobility Overal bed mobility: Needs Assistance Bed Mobility: Supine to Sit     Supine to sit: HOB elevated, Min assist, +2 for safety/equipment     General bed mobility comments: Able to bring LEs out of bed and use rail to position himself prior to rising. Min assist +2 for safety, pulling through therapist's hand to rise to EOB. Slow and guarded due to scrotal edema.    Transfers Overall  transfer level: Needs assistance Equipment used: Rolling walker (2 wheels) Transfers: Sit to/from Stand, Bed to chair/wheelchair/BSC Sit to Stand: +2 safety/equipment, Min assist, +2 physical assistance, From elevated surface   Step pivot transfers: +2 safety/equipment, Contact guard assist       General transfer comment: Min assist +2 for boost to stand from elevated bed surface x2; scrotal sling in place for comfort. Slow to rise, RW for support, wide BOS. Step pivot with CGA +2 for safety and line management. VC for RW placement and alignment prior to sitting, reduced control with descent into chair.    Ambulation/Gait                   Stairs             Wheelchair Mobility     Tilt Bed    Modified Rankin (Stroke Patients Only)       Balance Overall balance assessment: Needs assistance Sitting-balance support: No upper extremity supported, Feet supported Sitting balance-Leahy Scale: Fair Sitting balance - Comments: Limited mostly by discomfort from scrotal edema   Standing balance support: Bilateral upper extremity supported Standing balance-Leahy Scale: Poor Standing balance comment: UE support in standing                            Cognition Arousal: Alert Behavior During Therapy: WFL for tasks assessed/performed Overall Cognitive Status: No family/caregiver present to determine baseline cognitive functioning  Exercises General Exercises - Lower Extremity Ankle Circles/Pumps: AROM, Both, 15 reps, Supine Quad Sets: Strengthening, Both, 10 reps, Supine Gluteal Sets: Strengthening, Both, 10 reps, Seated Long Arc Quad: Strengthening, Both, 10 reps, Seated    General Comments General comments (skin integrity, edema, etc.): Pre activity: BP 99/82, HR 84, SpO2 97% on 2L, RR 14. During activity SpO2 96% on RA. Post activity: BP 90/68, HR 85 reclined in chair.      Pertinent  Vitals/Pain Pain Assessment Pain Assessment: PAINAD Faces Pain Scale: Hurts even more Pain Location: Abdomen, LEs, scrotum, back Pain Descriptors / Indicators: Guarding, Grimacing, Sore Pain Intervention(s): Monitored during session, Repositioned, Limited activity within patient's tolerance, Premedicated before session    Home Living                          Prior Function            PT Goals (current goals can now be found in the care plan section) Acute Rehab PT Goals Patient Stated Goal: return to independent PT Goal Formulation: With patient Time For Goal Achievement: 07/10/23 Potential to Achieve Goals: Good Progress towards PT goals: Progressing toward goals    Frequency    Min 1X/week      PT Plan      Co-evaluation PT/OT/SLP Co-Evaluation/Treatment: Yes Reason for Co-Treatment: Complexity of the patient's impairments (multi-system involvement);For patient/therapist safety;To address functional/ADL transfers PT goals addressed during session: Mobility/safety with mobility;Balance;Proper use of DME;Strengthening/ROM        AM-PAC PT "6 Clicks" Mobility   Outcome Measure  Help needed turning from your back to your side while in a flat bed without using bedrails?: A Little Help needed moving from lying on your back to sitting on the side of a flat bed without using bedrails?: A Lot Help needed moving to and from a bed to a chair (including a wheelchair)?: A Lot Help needed standing up from a chair using your arms (e.g., wheelchair or bedside chair)?: A Lot Help needed to walk in hospital room?: A Lot Help needed climbing 3-5 steps with a railing? : Total 6 Click Score: 12    End of Session Equipment Utilized During Treatment: Oxygen (Scrotal sling) Activity Tolerance: Patient limited by pain Patient left: with call bell/phone within reach;in chair;with chair alarm set   PT Visit Diagnosis: Other abnormalities of gait and mobility (R26.89);Muscle  weakness (generalized) (M62.81);Pain;Difficulty in walking, not elsewhere classified (R26.2) Pain - part of body:  (scrotum and LEs)     Time: 9562-1308 PT Time Calculation (min) (ACUTE ONLY): 35 min  Charges:    $Therapeutic Activity: 8-22 mins PT General Charges $$ ACUTE PT VISIT: 1 Visit                     Kathlyn Sacramento, PT, DPT [facility]  Rehabilitation Services Physical Therapist Office: [phone] Website: [email or web address]    Berton Mount 06/27/2023, 10:40 AM

## 2023-06-27 NOTE — Progress Notes (Signed)
[facility] Progress Note   Subjective:   no new issues - eating full breakfast this AM; I/Os yest 0.65 / 2.7.  Objective Vitals:   06/27/23 0500 06/27/23 0515 06/27/23 0700 06/27/23 0731  BP: 101/74 100/76 93/81 92/67   Pulse: 91 91 87 84  Resp: 13 13 12 18   Temp:    98 F (36.7 C)  TempSrc:    Oral  SpO2: 95% 95% 96% 97%  Weight:      Height:       Physical Exam Gen: obese chronically ill appearing man eating full breakfast Eyes: anicteric ENT: poor dentition, MM CV: RRR, no rrub Abd: soft, obese Lungs:  clear ant on RA Extr: 1+ LE edema, LE dressings at ankles - reviewed photos from H&P Neuro: conversant, grossly nonfocal Skin: no rashes, various scabs and excoriations over arms, forehead, legs  Additional Objective Labs: Basic Metabolic Panel: Recent Labs  Lab 06/25/23 2305 06/26/23 0300 06/27/23 0209  NA 122* 126* 124*  K 5.9* 4.7 3.7  CL 92* 95* 92*  CO2 16* 15* 18*  GLUCOSE 173* 112* 138*  BUN 78* 77* 84*  CREATININE 4.60* 4.88* 4.84*  CALCIUM 6.3* 6.9* 6.3*  PHOS  --  7.2*  --    Liver Function Tests: Recent Labs  Lab 06/25/23 2305 06/27/23 0209  AST 384* 318*  ALT 188* 189*  ALKPHOS 172* 174*  BILITOT 1.8* 1.5*  PROT 7.1 6.8  ALBUMIN 1.5* <1.5*   No results for input(s): "LIPASE", "AMYLASE" in the last 168 hours. CBC: Recent Labs  Lab 06/25/23 2305 06/26/23 0300 06/27/23 0209  WBC 18.9* 22.0* 21.6*  NEUTROABS 17.8*  --  21.2*  HGB 13.4 13.3 12.9*  HCT 40.9 41.8 39.4  MCV 87.0 89.7 85.3  PLT 351 262 207   Blood Culture    Component Value Date/Time   SDES BLOOD RIGHT FOREARM 06/25/2023 2305   SPECREQUEST  06/25/2023 2305    BOTTLES DRAWN AEROBIC ONLY Blood Culture adequate volume   CULT GRAM POSITIVE COCCI 06/25/2023 2305   REPTSTATUS PENDING 06/25/2023 2305    Cardiac Enzymes: No results for input(s): "CKTOTAL", "CKMB", "CKMBINDEX", "TROPONINI" in the last 168 hours. CBG: Recent Labs  Lab 06/26/23 1510  06/26/23 2023 06/26/23 2355 06/27/23 0348 06/27/23 0728  GLUCAP 135* 175* 167* 134* 141*   Iron Studies: No results for input(s): "IRON", "TIBC", "TRANSFERRIN", "FERRITIN" in the last 72 hours. @lablastinr3 @ Studies/Results: US RENAL  Result Date: 06/26/2023 CLINICAL DATA:  Acute kidney injury. EXAM: RENAL / URINARY TRACT ULTRASOUND COMPLETE COMPARISON:  CT chest, abdomen, and pelvis 06/26/2023. FINDINGS: Right Kidney: Renal measurements: 13.0 x 7.4 x 6.7 cm = volume: 338 mL. Mildly increased parenchymal echogenicity. No mass or hydronephrosis visualized. Left Kidney: Renal measurements: 13.2 x 6.8 x 7.2 cm = volume: 338 mL. Borderline to mildly increased parenchymal echogenicity. No mass or hydronephrosis visualized. Bladder: Decompressed with a Foley catheter in place. Other: None. IMPRESSION: 1. No hydronephrosis. 2. Mildly echogenic kidneys which can be seen with medical renal disease. Electronically Signed   By: Sebastian Ache M.D.   On: 06/26/2023 15:34   DG CHEST PORT 1 VIEW  Result Date: 06/26/2023 CLINICAL DATA:  52 year old male status post central line placement. EXAM: PORTABLE CHEST 1 VIEW COMPARISON:  Chest x-ray 06/25/2023. FINDINGS: New left internal jugular central venous catheter with tip terminating in the distal superior vena cava. Lung volumes are low. No consolidative airspace disease. No pleural effusions. No pneumothorax. No pulmonary nodule or mass noted. Pulmonary  vasculature appears normal. Heart size is mildly enlarged. The patient is rotated to the right on today's exam, resulting in distortion of the mediastinal contours and reduced diagnostic sensitivity and specificity for mediastinal pathology. Multiple old healed left-sided rib fractures are again incidentally noted. IMPRESSION: 1. Support apparatus, as above. 2. Cardiomegaly. Electronically Signed   By: Trudie Reed M.D.   On: 06/26/2023 05:57   CT CHEST ABDOMEN PELVIS WO CONTRAST  Result Date:  06/26/2023 CLINICAL DATA:  Shortness of breath, retaining fluid, mental status change EXAM: CT CHEST, ABDOMEN AND PELVIS WITHOUT CONTRAST TECHNIQUE: Multidetector CT imaging of the chest, abdomen and pelvis was performed following the standard protocol without IV contrast. RADIATION DOSE REDUCTION: This exam was performed according to the departmental dose-optimization program which includes automated exposure control, adjustment of the mA and/or kV according to patient size and/or use of iterative reconstruction technique. COMPARISON:  Chest radiograph 06/25/2023; abdominal ultrasound 12/27/2022 and CTA chest 11/26/2021 and CT abdomen and pelvis 05/05/2018 FINDINGS: CT CHEST FINDINGS Cardiovascular: Cardiomegaly. Small pericardial effusion. Coronary artery and aortic atherosclerotic calcification. Mediastinum/Nodes: Trachea and esophagus are unremarkable. No thoracic adenopathy. Lungs/Pleura: Bowing of the trachea consistent with expiratory phase exam. Mild bronchial wall thickening mucous plugging in the lower lobes. Small right pleural effusion. No pneumothorax. Musculoskeletal: Remote bilateral rib fractures.  No acute fracture. CT ABDOMEN PELVIS FINDINGS Hepatobiliary: Mild nodularity of the hepatic contour compatible with cirrhosis. Cholecystectomy. No biliary dilation. Pancreas: Fatty atrophy.  No acute abnormality. Spleen: Unremarkable. Adrenals/Urinary Tract: Stable adrenal glands. Nonobstructing left nephrolithiasis, unchanged from 2019. No ureteral calculi or hydronephrosis. Unremarkable bladder. Stomach/Bowel: No bowel obstruction or bowel wall thickening. Sigmoid diverticulosis. Adjacent stranding and fluid is favored due to vascular congestion rather than diverticulitis though this is difficult to exclude. Stomach and appendix are within normal limits. Vascular/Lymphatic: Aortic atherosclerotic calcification. New retroperitoneal, bilateral iliac chain, and bilateral inguinal lymphadenopathy compared  to 2019. For example 1.1 cm left periaortic node on series 4/image 77; 1.6 cm left iliac chain lymph node on series 4/image 117; and right iliac chain lymph node measuring 1.5 cm on series 4/image 114. Reproductive: Unremarkable. Other: Engorgement of the vasa recta with mesenteric edema in the central mesentery. Small volume abdominopelvic ascites greatest about the spleen. No free intraperitoneal air. Body wall anasarca. Musculoskeletal: No acute fracture. IMPRESSION: 1. Cardiomegaly.  Signs of fluid overload in the abdomen and pelvis. 2. Mild bronchial wall thickening and mucous plugging in the lower lobes. 3. Nodular hepatic contour suggesting early cirrhosis. 4. New indeterminate retroperitoneal, bilateral iliac chain, and bilateral inguinal lymphadenopathy compared to 2019. Recommend clinical/laboratory correlation for lymphoproliferative disorder and follow-up CT in 3 months. 5. Sigmoid diverticulosis. Adjacent stranding and fluid is favored due to vascular congestion rather than diverticulitis. Aortic Atherosclerosis (ICD10-I70.0). Electronically Signed   By: Minerva Fester M.D.   On: 06/26/2023 04:09   CT Head Wo Contrast  Result Date: 06/26/2023 CLINICAL DATA:  Shortness of breath, retaining fluid, mental status change EXAM: CT HEAD WITHOUT CONTRAST TECHNIQUE: Contiguous axial images were obtained from the base of the skull through the vertex without intravenous contrast. RADIATION DOSE REDUCTION: This exam was performed according to the departmental dose-optimization program which includes automated exposure control, adjustment of the mA and/or kV according to patient size and/or use of iterative reconstruction technique. COMPARISON:  None Available. FINDINGS: Brain: No intracranial hemorrhage, mass effect, or evidence of acute infarct. No hydrocephalus. No extra-axial fluid collection. Vascular: No hyperdense vessel or unexpected calcification. Skull: No fracture or focal lesion. Sinuses/Orbits:  No  acute finding. Other: Soft tissue edema within the posterior scalp. IMPRESSION: No acute intracranial abnormality. Electronically Signed   By: Minerva Fester M.D.   On: 06/26/2023 03:51   DG Foot Complete Left  Result Date: 06/26/2023 CLINICAL DATA:  Wound on the plantar surface of the left foot near the calcaneus. EXAM: LEFT FOOT - COMPLETE 3+ VIEW COMPARISON:  None Available. FINDINGS: There is no evidence of an acute fracture or dislocation. 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 A 3.3 cm x 1.3 cm soft tissue defect is seen involving the plantar aspect of the proximal left foot, adjacent to a moderate-sized plantar calcaneal spur. A 5 mm area of adjacent cortical erosion is noted. IMPRESSION: 1. Soft tissue defect involving the plantar aspect of the proximal left foot with findings suspicious for osteomyelitis. MRI correlation is recommended. 2. 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 Electronically Signed   By: Aram Candela M.D.   On: 06/26/2023 01:11   DG Chest Port 1 View  Result Date: 06/26/2023 CLINICAL DATA:  Shortness of breath with concern for sepsis. EXAM: PORTABLE CHEST 1 VIEW COMPARISON:  Dec 26, 2022 FINDINGS: The heart size and mediastinal contours are within normal limits. Low lung volumes are noted with mild atelectatic changes suspected within the bilateral lung bases. There is no evidence of focal consolidation, pleural effusion or pneumothorax. Multiple chronic left-sided rib fractures are seen. The visualized skeletal structures are otherwise unremarkable. IMPRESSION: Low lung volumes with mild bibasilar atelectasis. Electronically Signed   By: Aram Candela M.D.   On: 06/26/2023 01:08   Medications:  ceFEPime (MAXIPIME) IV 200 mL/hr at 06/27/23 0600   furosemide 120 mg (06/27/23 0759)   metronidazole 500 mg (06/27/23 0622)   norepinephrine (LEVOPHED) Adult infusion Stopped  (06/27/23 0342)    Chlorhexidine Gluconate Cloth  6 each Topical Q0600   heparin  5,000 Units Subcutaneous Q8H   insulin aspart  0-15 Units Subcutaneous Q4H   vancomycin variable dose per unstable renal function (pharmacist dosing)   Does not apply See admin instructions    Assessment/Plan :Dakota Ramirez is an 52 y.o. male with HFrEF (last known 20%), DM, HTN, foot wounds currently admitted for shock and nephrology is consulted for evaluation and management of AKI.    **Shock: septic +/- cardiogenic.  Pressors and inotropes per CCM.  Cardiology consulting repeat TTE pending.   Source believed to be LE wounds. On abx per primary.    **AKI: Normal baseline.  Cr 4.6 presentation > 4.9 > 4.8, UOP > 2L yesterday.  AKI not surprising in setting of shock - suspect ATN.  Renal US no obstruction.  UA 1+ protein and small blood, check UP/C.  Doubt new GN.  Follow course - no current indications for RRT but may develop in the next 24-48h.   Overall he's not a good candidate for dialysis with severe HFrEF, poor compliance w medical care, ongoing drug use, wounds.     **a/c HFrEF:  cardiology following - remains overloaded, cont lasix 120 IV BID for now. Can add metolazone if needed.    **AMGA: in setting of AKI and shock.  Mild, follow.   Bicarb improving.    **Hyponatremia, hypervolemic:  diuresing. Remains mid 120s.  Asymptomatic.    **DM type 2: A1c 10.3. Per primary.    Will follow.   Estill Bakes MD 06/27/2023, 8:07 AM  [facility] Pager: [phone]   

## 2023-06-27 NOTE — Progress Notes (Signed)
PHARMACY - PHYSICIAN COMMUNICATION CRITICAL VALUE ALERT - BLOOD CULTURE IDENTIFICATION (BCID)  Dakota Ramirez is an 52 y.o. male who presented to [facility] on 06/25/2023 with a chief complaint of shortness of breath  11/12 Blood: 1/3 GPC, no organism detected on rapid culture  Name of physician (or Provider) Contacted: Dr. Warrick Parisian  Current antibiotics: Vancomycin, Cefepime, Flagyl  Changes to prescribed antibiotics recommended:  No changes   [name], PharmD, BCPS Clinical Pharmacist Phone: [phone]
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
## 2023-06-28 ENCOUNTER — Ambulatory Visit: Payer: 59 | Admitting: Orthopedic Surgery

## 2023-06-28 DIAGNOSIS — R579 Shock, unspecified: Secondary | ICD-10-CM | POA: Diagnosis not present

## 2023-06-28 LAB — GLUCOSE, CAPILLARY
Glucose-Capillary: 113 mg/dL — ABNORMAL HIGH (ref 70–99)
Glucose-Capillary: 158 mg/dL — ABNORMAL HIGH (ref 70–99)
Glucose-Capillary: 160 mg/dL — ABNORMAL HIGH (ref 70–99)
Glucose-Capillary: 160 mg/dL — ABNORMAL HIGH (ref 70–99)
Glucose-Capillary: 176 mg/dL — ABNORMAL HIGH (ref 70–99)
Glucose-Capillary: 181 mg/dL — ABNORMAL HIGH (ref 70–99)
Glucose-Capillary: 207 mg/dL — ABNORMAL HIGH (ref 70–99)

## 2023-06-28 LAB — CBC
HCT: 38.2 % — ABNORMAL LOW (ref 39.0–52.0)
Hemoglobin: 12.9 g/dL — ABNORMAL LOW (ref 13.0–17.0)
MCH: 28.4 pg (ref 26.0–34.0)
MCHC: 33.8 g/dL (ref 30.0–36.0)
MCV: 84 fL (ref 80.0–100.0)
Platelets: 196 10*3/uL (ref 150–400)
RBC: 4.55 MIL/uL (ref 4.22–5.81)
RDW: 14.7 % (ref 11.5–15.5)
WBC: 18.8 10*3/uL — ABNORMAL HIGH (ref 4.0–10.5)
nRBC: 0.7 % — ABNORMAL HIGH (ref 0.0–0.2)

## 2023-06-28 LAB — BASIC METABOLIC PANEL
Anion gap: 14 (ref 5–15)
Anion gap: 12 (ref 5–15)
BUN: 79 mg/dL — ABNORMAL HIGH (ref 6–20)
BUN: 76 mg/dL — ABNORMAL HIGH (ref 6–20)
CO2: 19 mmol/L — ABNORMAL LOW (ref 22–32)
CO2: 25 mmol/L (ref 22–32)
Calcium: 6.2 mg/dL — CL (ref 8.9–10.3)
Calcium: 7.1 mg/dL — ABNORMAL LOW (ref 8.9–10.3)
Chloride: 97 mmol/L — ABNORMAL LOW (ref 98–111)
Chloride: 92 mmol/L — ABNORMAL LOW (ref 98–111)
Creatinine, Ser: 3.51 mg/dL — ABNORMAL HIGH (ref 0.61–1.24)
Creatinine, Ser: 3.01 mg/dL — ABNORMAL HIGH (ref 0.61–1.24)
GFR, Estimated: 20 mL/min — ABNORMAL LOW (ref >60)
GFR, Estimated: 24 mL/min — ABNORMAL LOW (ref >60)
Glucose, Bld: 115 mg/dL — ABNORMAL HIGH (ref 70–99)
Glucose, Bld: 188 mg/dL — ABNORMAL HIGH (ref 70–99)
Potassium: 2.9 mmol/L — ABNORMAL LOW (ref 3.5–5.1)
Potassium: 3.8 mmol/L (ref 3.5–5.1)
Sodium: 130 mmol/L — ABNORMAL LOW (ref 135–145)
Sodium: 129 mmol/L — ABNORMAL LOW (ref 135–145)

## 2023-06-28 LAB — COOXEMETRY PANEL
Carboxyhemoglobin: 2.2 % — ABNORMAL HIGH (ref 0.5–1.5)
Methemoglobin: <0.7 % (ref 0.0–1.5)
O2 Saturation: 76.3 %
Total hemoglobin: 13.5 g/dL (ref 12.0–16.0)

## 2023-06-28 LAB — VANCOMYCIN, RANDOM: Vancomycin Rm: 12 ug/mL

## 2023-06-28 MED ORDER — FUROSEMIDE 10 MG/ML IJ SOLN: 80.0000 mg | Freq: Two times a day (BID) | INTRAMUSCULAR | Status: DC

## 2023-06-28 MED ORDER — POTASSIUM CHLORIDE 10 MEQ/50ML IV SOLN
10.0000 meq | INTRAVENOUS | Status: AC
  Administered 2023-06-28 (×4): 10 meq via INTRAVENOUS
  Filled 2023-06-28 (×2): qty 50

## 2023-06-28 MED ORDER — POTASSIUM CHLORIDE 20 MEQ PO PACK
40.0000 meq | PACK | Freq: Three times a day (TID) | ORAL | Status: AC
  Administered 2023-06-28 (×3): 40 meq via ORAL
  Filled 2023-06-28 (×4): qty 2

## 2023-06-28 MED ORDER — SODIUM CHLORIDE 0.9 % IV SOLN
2.0000 g | Freq: Two times a day (BID) | INTRAVENOUS | Status: DC
  Administered 2023-06-28: 2 g via INTRAVENOUS
  Filled 2023-06-28: qty 12.5

## 2023-06-28 MED ORDER — CALCIUM GLUCONATE-NACL 2-0.675 GM/100ML-% IV SOLN
2.0000 g | Freq: Once | INTRAVENOUS | Status: AC
  Administered 2023-06-28: 2000 mg via INTRAVENOUS
  Filled 2023-06-28: qty 100

## 2023-06-28 MED ORDER — OXYCODONE HCL 5 MG PO TABS
5.0000 mg | ORAL_TABLET | Freq: Once | ORAL | Status: AC
  Administered 2023-06-28: 5 mg via ORAL
  Filled 2023-06-28: qty 1

## 2023-06-28 MED ORDER — FUROSEMIDE 10 MG/ML IJ SOLN
120.0000 mg | Freq: Once | INTRAVENOUS | Status: AC
  Administered 2023-06-28: 120 mg via INTRAVENOUS
  Filled 2023-06-28 (×2): qty 12

## 2023-06-28 MED ORDER — FUROSEMIDE 10 MG/ML IJ SOLN
80.0000 mg | Freq: Two times a day (BID) | INTRAMUSCULAR | Status: DC
  Administered 2023-06-28: 80 mg via INTRAVENOUS
  Filled 2023-06-28: qty 8

## 2023-06-28 MED ORDER — VANCOMYCIN HCL 1500 MG/300ML IV SOLN
1500.0000 mg | Freq: Once | INTRAVENOUS | Status: AC
  Administered 2023-06-28: 1500 mg via INTRAVENOUS
  Filled 2023-06-28: qty 300

## 2023-06-28 MED ORDER — MUPIROCIN 2 % EX OINT
TOPICAL_OINTMENT | Freq: Two times a day (BID) | CUTANEOUS | Status: DC
  Filled 2023-06-28 (×2): qty 22

## 2023-06-28 NOTE — Progress Notes (Signed)
eLink Physician-Brief Progress Note Patient Name: Dakota Ramirez DOB: [birth year] MRN: [id number]   Date of Service  06/28/2023  HPI/Events of Note  Corrected calcium 8.2 gm / dl, K+ 2.9.  eICU Interventions  Calcium gluconate 2 gm IV x 1 ordered, KCL 10 meq IV Q 1 hour x 4 ordered.        Thomasene Lot Antwone Capozzoli 06/28/2023, 3:55 AM

## 2023-06-28 NOTE — TOC Progression Note (Signed)
Transition of Care [facility]) - Progression Note    Patient Details  Name: [name] MRN: [id number] Date of Birth: 01-01-1971  Transition of Care [facility] CM/SW Contact  Nicanor Bake Phone Number: [phone] 06/28/2023, 11:39 AM  Clinical Narrative:   [facility] CSW called and scheduled pts Hospital follow up appointment scheduled for Monday, July 22, 2023 at 3:45 PM. CSW will call and cancel/reschedule if the pt is still hospitalized.   TOC will continue following.     Expected Discharge Plan: Home w Home Health Services Barriers to Discharge: Continued Medical Work up  Expected Discharge Plan and Services       Living arrangements for the past 2 months: Mobile Home                                       Social Determinants of Health (SDOH) Interventions SDOH Screenings   Food Insecurity: Food Insecurity Present (12/28/2022)  Housing: Patient Declined (12/28/2022)  Transportation Needs: No Transportation Needs (11/29/2021)  Utilities: Not At Risk (12/28/2022)  Depression (PHQ2-9): Low Risk  (01/17/2023)  Recent Concern: Depression (PHQ2-9) - Medium Risk (01/10/2023)  Financial Resource Strain: High Risk (01/10/2023)  Social Connections: Unknown (12/15/2021)   Received from [facility]  Stress: No Stress Concern Present (02/28/2021)   Received from [facility]  Tobacco Use: Low Risk  (06/25/2023)    Readmission Risk Interventions     No data to display
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
## 2023-06-28 NOTE — Plan of Care (Signed)
  Problem: Nutritional: Goal: Progress toward achieving an optimal weight will improve Outcome: Progressing   Problem: Nutritional: Goal: Maintenance of adequate nutrition will improve Outcome: Progressing   Problem: Tissue Perfusion: Goal: Adequacy of tissue perfusion will improve Outcome: Progressing

## 2023-06-28 NOTE — Progress Notes (Signed)
[facility] Progress Note   Subjective:   UOP doc as 8L yesterday with lasix 120 IV BID. Reports no new issues this AM just thirsty.   Objective Vitals:   06/28/23 0500 06/28/23 0530 06/28/23 0600 06/28/23 0700  BP: 114/75 110/73 114/78 (!) 87/69  Pulse: (!) 102 99 98 99  Resp: 14 12 12 16   Temp:      TempSrc:      SpO2: 96% 97% 97% 98%  Weight:      Height:       Physical Exam Gen: obese chronically ill appearing man eating full breakfast Eyes: anicteric ENT: poor dentition, MM CV: RRR, no rrub Abd: soft, obese Lungs:  clear ant on RA Extr: trace LE edema, LE dressings at feet BL Neuro: conversant, grossly nonfocal Skin: no rashes, various scabs and excoriations over arms, forehead, legs  Additional Objective Labs: Basic Metabolic Panel: Recent Labs  Lab 06/26/23 0300 06/27/23 0209 06/28/23 0210  NA 126* 124* 130*  K 4.7 3.7 2.9*  CL 95* 92* 97*  CO2 15* 18* 19*  GLUCOSE 112* 138* 115*  BUN 77* 84* 79*  CREATININE 4.88* 4.84* 3.51*  CALCIUM 6.9* 6.3* 6.2*  PHOS 7.2*  --   --    Liver Function Tests: Recent Labs  Lab 06/25/23 2305 06/27/23 0209  AST 384* 318*  ALT 188* 189*  ALKPHOS 172* 174*  BILITOT 1.8* 1.5*  PROT 7.1 6.8  ALBUMIN 1.5* <1.5*   No results for input(s): "LIPASE", "AMYLASE" in the last 168 hours. CBC: Recent Labs  Lab 06/25/23 2305 06/26/23 0300 06/27/23 0209 06/28/23 0210  WBC 18.9* 22.0* 21.6* 18.8*  NEUTROABS 17.8*  --  21.2*  --   HGB 13.4 13.3 12.9* 12.9*  HCT 40.9 41.8 39.4 38.2*  MCV 87.0 89.7 85.3 84.0  PLT 351 262 207 196   Blood Culture    Component Value Date/Time   SDES BLOOD RIGHT FOREARM 06/25/2023 2305   SPECREQUEST  06/25/2023 2305    BOTTLES DRAWN AEROBIC ONLY Blood Culture adequate volume   CULT  06/25/2023 2305    GRAM POSITIVE COCCI IDENTIFICATION TO FOLLOW Performed at [facility], [address] [place], [place] [address]    REPTSTATUS PENDING 06/25/2023 2305     Cardiac Enzymes: No results for input(s): "CKTOTAL", "CKMB", "CKMBINDEX", "TROPONINI" in the last 168 hours. CBG: Recent Labs  Lab 06/27/23 1132 06/27/23 1553 06/27/23 1930 06/28/23 0017 06/28/23 0408  GLUCAP 136* 148* 137* 113* 160*   Iron Studies: No results for input(s): "IRON", "TIBC", "TRANSFERRIN", "FERRITIN" in the last 72 hours. @lablastinr3 @ Studies/Results: ECHOCARDIOGRAM COMPLETE  Result Date: 06/27/2023    ECHOCARDIOGRAM REPORT   Patient Name:   AARRON MCCRILLIS Date of Exam: 06/27/2023 Medical Rec #:  [id number]    Height:       72.0 in Accession #:    [id number]   Weight:       293.9 lb Date of Birth:  February 12, 1971    BSA:          2.509 m Patient Age:    52 years     BP:           92/67 mmHg Patient Gender: M            HR:           84 bpm. Exam Location:  Inpatient Procedure: 2D Echo, Color Doppler and Cardiac Doppler Indications:    Septic Shock  History:  Patient has prior history of Echocardiogram examinations, most                 recent 12/27/2022. CHF, PVD, Signs/Symptoms:Sepsis; Risk                 Factors:Hypertension.  Sonographer:    Milbert Coulter Referring Phys: [id number] Clarene Critchley HOFFMAN IMPRESSIONS  1. Left ventricular ejection fraction, by estimation, is 20 to 25%. The left ventricle has severely decreased function. The left ventricle demonstrates global hypokinesis. The left ventricular internal cavity size was mildly dilated. Left ventricular diastolic parameters are consistent with Grade II diastolic dysfunction (pseudonormalization).  2. Right ventricular systolic function is mildly reduced. The right ventricular size is moderately enlarged. There is mildly elevated pulmonary artery systolic pressure.  3. Left atrial size was severely dilated.  4. Right atrial size was mildly dilated.  5. Compared with the echo 12/2022, the mitral valve is thickened and there appears to be a mobile density on the posterior mitral valve leaflet measuring 1.59 x 0.61 cm. Recommend TEE  to better evaluate. The mitral valve is normal in structure. Mild mitral valve regurgitation. No evidence of mitral stenosis.  6. The aortic valve is tricuspid. Aortic valve regurgitation is not visualized. No aortic stenosis is present.  7. The inferior vena cava is dilated in size with <50% respiratory variability, suggesting right atrial pressure of 15 mmHg. FINDINGS  Left Ventricle: Left ventricular ejection fraction, by estimation, is 20 to 25%. The left ventricle has severely decreased function. The left ventricle demonstrates global hypokinesis. The left ventricular internal cavity size was mildly dilated. There is no left ventricular hypertrophy. Left ventricular diastolic parameters are consistent with Grade II diastolic dysfunction (pseudonormalization). Indeterminate filling pressures. Right Ventricle: The right ventricular size is moderately enlarged. No increase in right ventricular wall thickness. Right ventricular systolic function is mildly reduced. There is mildly elevated pulmonary artery systolic pressure. The tricuspid regurgitant velocity is 2.64 m/s, and with an assumed right atrial pressure of 15 mmHg, the estimated right ventricular systolic pressure is 42.9 mmHg. Left Atrium: Left atrial size was severely dilated. Right Atrium: Right atrial size was mildly dilated. Pericardium: There is no evidence of pericardial effusion. Mitral Valve: Compared with the echo 12/2022, the mitral valve is thickened and there appears to be a mobile density on the posterior mitral valve leaflet measuring 1.59 x 0.61 cm. Recommend TEE to better evaluate. The mitral valve is normal in structure.  Mild mitral valve regurgitation. No evidence of mitral valve stenosis. Tricuspid Valve: The tricuspid valve is normal in structure. Tricuspid valve regurgitation is mild . No evidence of tricuspid stenosis. Aortic Valve: The aortic valve is tricuspid. Aortic valve regurgitation is not visualized. No aortic stenosis is  present. Aortic valve mean gradient measures 3.0 mmHg. Aortic valve peak gradient measures 4.8 mmHg. Aortic valve area, by VTI measures 1.77 cm. Pulmonic Valve: The pulmonic valve was normal in structure. Pulmonic valve regurgitation is not visualized. No evidence of pulmonic stenosis. Aorta: The aortic root is normal in size and structure. Venous: The inferior vena cava is dilated in size with less than 50% respiratory variability, suggesting right atrial pressure of 15 mmHg. IAS/Shunts: No atrial level shunt detected by color flow Doppler.  LEFT VENTRICLE PLAX 2D LVIDd:         6.10 cm   Diastology LVIDs:         5.50 cm   LV e' medial:    7.29 cm/s LV PW:  1.10 cm   LV E/e' medial:  12.4 LV IVS:        0.90 cm   LV e' lateral:   12.70 cm/s LVOT diam:     1.70 cm   LV E/e' lateral: 7.1 LV SV:         33 LV SV Index:   13 LVOT Area:     2.27 cm  RIGHT VENTRICLE RV Basal diam:  5.70 cm RV Mid diam:    5.70 cm RV S prime:     10.60 cm/s TAPSE (M-mode): 1.6 cm LEFT ATRIUM              Index        RIGHT ATRIUM           Index LA diam:        4.70 cm  1.87 cm/m   RA Area:     24.70 cm LA Vol (A2C):   71.8 ml  28.62 ml/m  RA Volume:   77.30 ml  30.81 ml/m LA Vol (A4C):   104.0 ml 41.45 ml/m LA Biplane Vol: 88.7 ml  35.35 ml/m  AORTIC VALVE AV Area (Vmax):    1.91 cm AV Area (Vmean):   1.64 cm AV Area (VTI):     1.77 cm AV Vmax:           109.00 cm/s AV Vmean:          82.200 cm/s AV VTI:            0.188 m AV Peak Grad:      4.8 mmHg AV Mean Grad:      3.0 mmHg LVOT Vmax:         91.80 cm/s LVOT Vmean:        59.300 cm/s LVOT VTI:          0.147 m LVOT/AV VTI ratio: 0.78  AORTA Ao Root diam: 2.60 cm Ao Asc diam:  3.50 cm MITRAL VALVE                  TRICUSPID VALVE MV Area (PHT): 5.31 cm       TR Peak grad:   27.9 mmHg MV Decel Time: 143 msec       TR Vmax:        264.00 cm/s MR Peak grad:    76.7 mmHg MR Mean grad:    51.0 mmHg    SHUNTS MR Vmax:         438.00 cm/s  Systemic VTI:  0.15 m MR Vmean:         340.0 cm/s   Systemic Diam: 1.70 cm MR PISA:         2.26 cm MR PISA Eff ROA: 16 mm MR PISA Radius:  0.60 cm MV E velocity: 90.50 cm/s MV A velocity: 61.60 cm/s MV E/A ratio:  1.47 Chilton Si MD Electronically signed by Chilton Si MD Signature Date/Time: 06/27/2023/2:38:10 PM    Final    US RENAL  Result Date: 06/26/2023 CLINICAL DATA:  Acute kidney injury. EXAM: RENAL / URINARY TRACT ULTRASOUND COMPLETE COMPARISON:  CT chest, abdomen, and pelvis 06/26/2023. FINDINGS: Right Kidney: Renal measurements: 13.0 x 7.4 x 6.7 cm = volume: 338 mL. Mildly increased parenchymal echogenicity. No mass or hydronephrosis visualized. Left Kidney: Renal measurements: 13.2 x 6.8 x 7.2 cm = volume: 338 mL. Borderline to mildly increased parenchymal echogenicity. No mass or hydronephrosis visualized. Bladder: Decompressed with a Foley catheter in place. Other: None. IMPRESSION:  1. No hydronephrosis. 2. Mildly echogenic kidneys which can be seen with medical renal disease. Electronically Signed   By: Sebastian Ache M.D.   On: 06/26/2023 15:34   Medications:  ceFEPime (MAXIPIME) IV Stopped (06/28/23 1610)   furosemide Stopped (06/27/23 1826)   metronidazole 100 mL/hr at 06/28/23 0700   norepinephrine (LEVOPHED) Adult infusion Stopped (06/27/23 0342)   potassium chloride 10 mEq (06/28/23 0719)    Chlorhexidine Gluconate Cloth  6 each Topical Q0600   heparin  5,000 Units Subcutaneous Q8H   insulin aspart  0-15 Units Subcutaneous Q4H   vancomycin variable dose per unstable renal function (pharmacist dosing)   Does not apply See admin instructions    Assessment/Plan :DAYMIEN CUTCHIN is an 52 y.o. male with HFrEF (last known 20%), DM, HTN, foot wounds currently admitted for shock and nephrology is consulted for evaluation and management of AKI.    **Shock: septic +/- cardiogenic.  Pressors and inotropes per CCM.  Cardiology consulting repeat TTE EF 20-25%, vol OL.   Source believed to be LE wounds, blood cx  neg. On abx per primary.    **AKI: Normal baseline.  Cr 4.6 presentation > 4.9 > 4.8 > 3.5.  AKI not surprising in setting of shock - suspect ATN now with post ATN diuresis.  Renal US no obstruction.  UA 1+ protein and small blood, expect diabetic kidney disease not GN.  Suspect he'll continue to improve over the coming days.   Continue to avoid hypovolemia, hypotension, nephrotoxins.  Follow I/Os, daily labs.   **hypokalemia: in setting of massive diuresis; supplement 40 TID x 3 doses today.   **a/c HFrEF:  cardiology following, no signs of cardiogenic shock.  Diuresing great now, hold diuretics today.   Once kidney function improves further add back meds, diuretics.     **AMGA: in setting of AKI and shock.  Mild, follow.   Bicarb improving.    **Hyponatremia, hypervolemic:  diuresing. Improving  Asymptomatic.   **HypoCa:  mainly corrects to normal for albumin < 1.5. Had IV supplement this AM.    **DM type 2: A1c 10.3. Per primary.    Expect continued recovery so will sign off.  Pt with normal kidney function as of just 04/2023.  Can f/u with PCP outpt and if left with residual CKD after this episode (would expect it would be fairly mild based on rapid improvement ) he can be referred outpt for ongoing nephrology care.   Estill Bakes MD 06/28/2023, 8:34 AM  [facility] Pager: [phone]
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
## 2023-06-28 NOTE — Progress Notes (Signed)
Physical Therapy Treatment Patient Details Name: Dakota Ramirez MRN: [id number] DOB: 1970-09-19 Today's Date: 06/28/2023   History of Present Illness Patient is a 52 y/o male admitted 06/26/23 with SOB and scrotal edema.  Found to be hypotensive with AKI and concern for sepsis with foot wounds. Recent MCA 11/10.  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    PT Comments  Pt admitted with above diagnosis. Pt was able to ambulate a short distance with use of RW and min assist +2 for safety as pt continues to be sore from scrotal edema.  Impulsive at times due to pain.  VSS during treatment and is progressing.  Will continue to follow acutely.  Pt currently with functional limitations due to the deficits listed below (see PT Problem List). Pt will benefit from acute skilled PT to increase their independence and safety with mobility to allow discharge.       If plan is discharge home, recommend the following: Assistance with cooking/housework;Assist for transportation;Help with stairs or ramp for entrance;A little help with walking and/or transfers;A little help with bathing/dressing/bathroom   Can travel by private vehicle        Equipment Recommendations  Rollator (4 wheels);BSC/3in1 (TBA, needs drop arm commode and may need bariatric)    Recommendations for Other Services       Precautions / Restrictions Precautions Precautions: Fall Precaution Comments: R transmet amp; scrotal edema, watch BP Required Braces or Orthoses: Other Brace Other Brace: fabricated scrotal sling 11/14 but it was removed on PT arrival Restrictions Weight Bearing Restrictions: No     Mobility  Bed Mobility Overal bed mobility: Needs Assistance Bed Mobility: Supine to Sit     Supine to sit: HOB elevated, +2 for safety/equipment, Mod assist     General bed mobility comments: Able to bring LEs out  of bed and use rail to position himself prior to rising. Mod assist +2 for safety, pulling through therapist's hand to rise to EOB. Slow and guarded due to scrotal edema and it helped to have pt hold pad up to cup the scrotum as he moved. Also with phantom pain bil feet.    Transfers Overall transfer level: Needs assistance Equipment used: Rolling walker (2 wheels) Transfers: Sit to/from Stand, Bed to chair/wheelchair/BSC Sit to Stand: +2 safety/equipment, Min assist, +2 physical assistance, From elevated surface (elevated bed as he couldnt up with bed in low position)           General transfer comment: Min assist +2 for boost to stand from elevated bed surface x2. Slow to rise, RW for support, wide BOS.    Ambulation/Gait Ambulation/Gait assistance: Min assist, +2 safety/equipment Gait Distance (Feet): 20 Feet Assistive device: Rolling walker (2 wheels) Gait Pattern/deviations: Step-to pattern, Trunk flexed, Wide base of support   Gait velocity interpretation: <1.31 ft/sec, indicative of household ambulator   General Gait Details: Pt able to walk around the bed to the recliner on the other side of the bed with use of RW.  VC for RW placement and alignment prior to sitting, reduced control with descent into chair only reaching back with one hand.   Stairs             Wheelchair Mobility     Tilt Bed    Modified Rankin (Stroke Patients Only)       Balance Overall balance assessment: Needs assistance Sitting-balance support: No  upper extremity supported, Feet supported Sitting balance-Leahy Scale: Fair Sitting balance - Comments: Limited mostly by discomfort from scrotal edema   Standing balance support: Bilateral upper extremity supported Standing balance-Leahy Scale: Poor Standing balance comment: UE support in standing                            Cognition Arousal: Alert Behavior During Therapy: WFL for tasks assessed/performed Overall Cognitive  Status: No family/caregiver present to determine baseline cognitive functioning                                 General Comments: follows commands, distracted by pain, grateful for help        Exercises General Exercises - Lower Extremity Ankle Circles/Pumps: AROM, Both, 15 reps, Supine Long Arc Quad: Strengthening, Both, Seated, 5 reps    General Comments General comments (skin integrity, edema, etc.): 94 bpm, Pre BP 98/77; sitting BP 105/74      Pertinent Vitals/Pain Pain Assessment Pain Assessment: Faces Faces Pain Scale: Hurts even more Pain Location: Abdomen, LEs, scrotum, back Pain Descriptors / Indicators: Guarding, Grimacing, Sore Pain Intervention(s): Limited activity within patient's tolerance, Monitored during session, Repositioned    Home Living                          Prior Function            PT Goals (current goals can now be found in the care plan section) Acute Rehab PT Goals Patient Stated Goal: return to independent Progress towards PT goals: Progressing toward goals    Frequency    Min 1X/week      PT Plan      Co-evaluation              AM-PAC PT "6 Clicks" Mobility   Outcome Measure  Help needed turning from your back to your side while in a flat bed without using bedrails?: A Little Help needed moving from lying on your back to sitting on the side of a flat bed without using bedrails?: A Lot Help needed moving to and from a bed to a chair (including a wheelchair)?: A Little Help needed standing up from a chair using your arms (e.g., wheelchair or bedside chair)?: A Lot Help needed to walk in hospital room?: A Lot Help needed climbing 3-5 steps with a railing? : Total 6 Click Score: 13    End of Session Equipment Utilized During Treatment: Gait belt Activity Tolerance: Patient limited by pain Patient left: with call bell/phone within reach;in chair;with chair alarm set Nurse Communication: Mobility  status PT Visit Diagnosis: Other abnormalities of gait and mobility (R26.89);Muscle weakness (generalized) (M62.81);Pain;Difficulty in walking, not elsewhere classified (R26.2) Pain - part of body:  (scrotum and LEs)     Time: 1610-9604 PT Time Calculation (min) (ACUTE ONLY): 29 min  Charges:    $Gait Training: 8-22 mins $Therapeutic Activity: 8-22 mins PT General Charges $$ ACUTE PT VISIT: 1 Visit                     Belma Dyches M,PT Acute Rehab Services [phone]    Bevelyn Buckles 06/28/2023, 11:38 AM

## 2023-06-28 NOTE — Progress Notes (Addendum)
Advanced Heart Failure Rounding Note  PCP-Cardiologist: None   Subjective:   Admitted w/ mixed septic and cardiogenic shock.  Echo 06/27/23 EF 20-25%, LV with GHK, RV mildly reduced, LA sev dilated, RA mildly dilated, mobile density on posterior mitral valve leaflet  Remains on abx. Afebrile, WBC now downtrending, 22>19K. BCx NGTD   LA cleared 2.6>>1.7.  Co-ox 76% today.  Excellent diuresis yesterday with 120 IV lasix BID. -8.4L UOP, net - 6.3L. Weight down 14lbs. SCr 4.8>3.5 today.  Serum Na 130  Feels ok just tired of being in the hospital, ready to go home and just overall upset.   Objective:   Weight Range: 126.9 kg Body mass index is 37.94 kg/m.   Vital Signs:   Temp:  [95.4 F (35.2 C)-98.2 F (36.8 C)] 97.8 F (36.6 C) (11/15 0410) Pulse Rate:  [84-105] 97 (11/15 1000) Resp:  [10-22] 10 (11/15 1000) BP: (87-124)/(52-86) 108/74 (11/15 1000) SpO2:  [95 %-100 %] 99 % (11/15 1000) Weight:  [126.9 kg] 126.9 kg (11/15 0426) Last BM Date : 06/27/23  Weight change: Filed Weights   06/25/23 2228 06/27/23 0434 06/28/23 0426  Weight: 120.2 kg 133.3 kg 126.9 kg    Intake/Output:   Intake/Output Summary (Last 24 hours) at 06/28/2023 1057 Last data filed at 06/28/2023 1000 Gross per 24 hour  Intake 1282.08 ml  Output 16109 ml  Net -9392.92 ml     CVP 6-10 (+resp variation) Physical Exam   General:  well appearing.  No respiratory difficulty HEENT: normal Neck: supple. JVD ~12 cm. Carotids 2+ bilat; no bruits. No lymphadenopathy or thyromegaly appreciated. L internal jugular CVC Cor: PMI nondisplaced. Regular rate & rhythm. No rubs, gallops or murmurs. Lungs: clear Abdomen: soft, nontender, nondistended. No hepatosplenomegaly. No bruits or masses. Good bowel sounds. Extremities: no cyanosis, clubbing, rash, +1 BLE edema. R TMA GU: +foley Neuro: alert & oriented x 3, cranial nerves grossly intact. moves all 4 extremities w/o difficulty. Upset.  Telemetry    NSR 90s with PVCs (Personally reviewed)    EKG    N/A   Labs    CBC Recent Labs    06/25/23 2305 06/26/23 0300 06/27/23 0209 06/28/23 0210  WBC 18.9*   < > 21.6* 18.8*  NEUTROABS 17.8*  --  21.2*  --   HGB 13.4   < > 12.9* 12.9*  HCT 40.9   < > 39.4 38.2*  MCV 87.0   < > 85.3 84.0  PLT 351   < > 207 196   < > = values in this interval not displayed.   Basic Metabolic Panel Recent Labs    60/45/40 0300 06/27/23 0209 06/28/23 0210  NA 126* 124* 130*  K 4.7 3.7 2.9*  CL 95* 92* 97*  CO2 15* 18* 19*  GLUCOSE 112* 138* 115*  BUN 77* 84* 79*  CREATININE 4.88* 4.84* 3.51*  CALCIUM 6.9* 6.3* 6.2*  MG 1.6* 2.0  --   PHOS 7.2*  --   --    Liver Function Tests Recent Labs    06/25/23 2305 06/27/23 0209  AST 384* 318*  ALT 188* 189*  ALKPHOS 172* 174*  BILITOT 1.8* 1.5*  PROT 7.1 6.8  ALBUMIN 1.5* <1.5*   No results for input(s): "LIPASE", "AMYLASE" in the last 72 hours. Cardiac Enzymes No results for input(s): "CKTOTAL", "CKMB", "CKMBINDEX", "TROPONINI" in the last 72 hours.  BNP: BNP (last 3 results) Recent Labs    02/25/23 1418 04/22/23 1420 06/25/23 2300  BNP  1,019.9* 839.3* 1,991.5*    ProBNP (last 3 results) No results for input(s): "PROBNP" in the last 8760 hours.   D-Dimer No results for input(s): "DDIMER" in the last 72 hours. Hemoglobin A1C Recent Labs    06/26/23 0300  HGBA1C 10.3*   Fasting Lipid Panel No results for input(s): "CHOL", "HDL", "LDLCALC", "TRIG", "CHOLHDL", "LDLDIRECT" in the last 72 hours. Thyroid Function Tests No results for input(s): "TSH", "T4TOTAL", "T3FREE", "THYROIDAB" in the last 72 hours.  Invalid input(s): "FREET3"  Other results:   Imaging    ECHOCARDIOGRAM COMPLETE  Result Date: 06/27/2023    ECHOCARDIOGRAM REPORT   Patient Name:   EYOSIAS ANSTETT Date of Exam: 06/27/2023 Medical Rec #:  [id number]    Height:       72.0 in Accession #:    [id number]   Weight:       293.9 lb Date of Birth:   [birth year]    BSA:          2.509 m Patient Age:    52 years     BP:           92/67 mmHg Patient Gender: M            HR:           84 bpm. Exam Location:  Inpatient Procedure: 2D Echo, Color Doppler and Cardiac Doppler Indications:    Septic Shock  History:        Patient has prior history of Echocardiogram examinations, most                 recent 12/27/2022. CHF, PVD, Signs/Symptoms:Sepsis; Risk                 Factors:Hypertension.  Sonographer:    Milbert Coulter Referring Phys: [id number] Clarene Critchley HOFFMAN IMPRESSIONS  1. Left ventricular ejection fraction, by estimation, is 20 to 25%. The left ventricle has severely decreased function. The left ventricle demonstrates global hypokinesis. The left ventricular internal cavity size was mildly dilated. Left ventricular diastolic parameters are consistent with Grade II diastolic dysfunction (pseudonormalization).  2. Right ventricular systolic function is mildly reduced. The right ventricular size is moderately enlarged. There is mildly elevated pulmonary artery systolic pressure.  3. Left atrial size was severely dilated.  4. Right atrial size was mildly dilated.  5. Compared with the echo 12/2022, the mitral valve is thickened and there appears to be a mobile density on the posterior mitral valve leaflet measuring 1.59 x 0.61 cm. Recommend TEE to better evaluate. The mitral valve is normal in structure. Mild mitral valve regurgitation. No evidence of mitral stenosis.  6. The aortic valve is tricuspid. Aortic valve regurgitation is not visualized. No aortic stenosis is present.  7. The inferior vena cava is dilated in size with <50% respiratory variability, suggesting right atrial pressure of 15 mmHg. FINDINGS  Left Ventricle: Left ventricular ejection fraction, by estimation, is 20 to 25%. The left ventricle has severely decreased function. The left ventricle demonstrates global hypokinesis. The left ventricular internal cavity size was mildly dilated. There is no left  ventricular hypertrophy. Left ventricular diastolic parameters are consistent with Grade II diastolic dysfunction (pseudonormalization). Indeterminate filling pressures. Right Ventricle: The right ventricular size is moderately enlarged. No increase in right ventricular wall thickness. Right ventricular systolic function is mildly reduced. There is mildly elevated pulmonary artery systolic pressure. The tricuspid regurgitant velocity is 2.64 m/s, and with an assumed right atrial pressure of 15 mmHg, the estimated right ventricular systolic  pressure is 42.9 mmHg. Left Atrium: Left atrial size was severely dilated. Right Atrium: Right atrial size was mildly dilated. Pericardium: There is no evidence of pericardial effusion. Mitral Valve: Compared with the echo 12/2022, the mitral valve is thickened and there appears to be a mobile density on the posterior mitral valve leaflet measuring 1.59 x 0.61 cm. Recommend TEE to better evaluate. The mitral valve is normal in structure.  Mild mitral valve regurgitation. No evidence of mitral valve stenosis. Tricuspid Valve: The tricuspid valve is normal in structure. Tricuspid valve regurgitation is mild . No evidence of tricuspid stenosis. Aortic Valve: The aortic valve is tricuspid. Aortic valve regurgitation is not visualized. No aortic stenosis is present. Aortic valve mean gradient measures 3.0 mmHg. Aortic valve peak gradient measures 4.8 mmHg. Aortic valve area, by VTI measures 1.77 cm. Pulmonic Valve: The pulmonic valve was normal in structure. Pulmonic valve regurgitation is not visualized. No evidence of pulmonic stenosis. Aorta: The aortic root is normal in size and structure. Venous: The inferior vena cava is dilated in size with less than 50% respiratory variability, suggesting right atrial pressure of 15 mmHg. IAS/Shunts: No atrial level shunt detected by color flow Doppler.  LEFT VENTRICLE PLAX 2D LVIDd:         6.10 cm   Diastology LVIDs:         5.50 cm   LV e'  medial:    7.29 cm/s LV PW:         1.10 cm   LV E/e' medial:  12.4 LV IVS:        0.90 cm   LV e' lateral:   12.70 cm/s LVOT diam:     1.70 cm   LV E/e' lateral: 7.1 LV SV:         33 LV SV Index:   13 LVOT Area:     2.27 cm  RIGHT VENTRICLE RV Basal diam:  5.70 cm RV Mid diam:    5.70 cm RV S prime:     10.60 cm/s TAPSE (M-mode): 1.6 cm LEFT ATRIUM              Index        RIGHT ATRIUM           Index LA diam:        4.70 cm  1.87 cm/m   RA Area:     24.70 cm LA Vol (A2C):   71.8 ml  28.62 ml/m  RA Volume:   77.30 ml  30.81 ml/m LA Vol (A4C):   104.0 ml 41.45 ml/m LA Biplane Vol: 88.7 ml  35.35 ml/m  AORTIC VALVE AV Area (Vmax):    1.91 cm AV Area (Vmean):   1.64 cm AV Area (VTI):     1.77 cm AV Vmax:           109.00 cm/s AV Vmean:          82.200 cm/s AV VTI:            0.188 m AV Peak Grad:      4.8 mmHg AV Mean Grad:      3.0 mmHg LVOT Vmax:         91.80 cm/s LVOT Vmean:        59.300 cm/s LVOT VTI:          0.147 m LVOT/AV VTI ratio: 0.78  AORTA Ao Root diam: 2.60 cm Ao Asc diam:  3.50 cm MITRAL VALVE  TRICUSPID VALVE MV Area (PHT): 5.31 cm       TR Peak grad:   27.9 mmHg MV Decel Time: 143 msec       TR Vmax:        264.00 cm/s MR Peak grad:    76.7 mmHg MR Mean grad:    51.0 mmHg    SHUNTS MR Vmax:         438.00 cm/s  Systemic VTI:  0.15 m MR Vmean:        340.0 cm/s   Systemic Diam: 1.70 cm MR PISA:         2.26 cm MR PISA Eff ROA: 16 mm MR PISA Radius:  0.60 cm MV E velocity: 90.50 cm/s MV A velocity: 61.60 cm/s MV E/A ratio:  1.47 Chilton Si MD Electronically signed by Chilton Si MD Signature Date/Time: 06/27/2023/2:38:10 PM    Final      Medications:   Scheduled Medications:  Chlorhexidine Gluconate Cloth  6 each Topical Q0600   heparin  5,000 Units Subcutaneous Q8H   insulin aspart  0-15 Units Subcutaneous Q4H   mupirocin ointment   Nasal BID   potassium chloride  40 mEq Oral TID   vancomycin variable dose per unstable renal function (pharmacist  dosing)   Does not apply See admin instructions    Infusions:  ceFEPime (MAXIPIME) IV     norepinephrine (LEVOPHED) Adult infusion Stopped (06/27/23 0342)   vancomycin      PRN Medications:    Patient Profile   52 y/o male w/ chronic systolic heart failure w/ severe biventricular dysfunction due to NICM, uncontrolled T2DM, chronic LE wounds/ chronic osteomyelitis s/p left toe amputation, rt TMA and poor compliance, now admitted w/ acute on chronic systolic heart failure and mixed septic and cardiogenic shock in setting of recurrent foot infection.   Assessment/Plan   1. Acute on Chronic Systolic Heart Failure w/ Cardiogenic Shock   - NICM. Reports hx CHF in his mother. No hx alcohol or drug abuse. 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 RVEF 22% LGE imaging very poor quality, which limits interpretation. Appears to have RV insertion site LGE (associated with worse prognosis) - Echo (5/24): EF < 20%, LV with GHK, RV mildly reduced, LA/RA sev dilated, mild-mod MR, mild TR, trivial pericardial effusion.  - Echo 06/27/23 EF 20-25%, LV with GHK, RV mildly reduced, LA sev dilated, RA mildly dilated, mobile density on posterior mitral valve leaflet - Now admitted w/ marked volume overload and CGS, c/b septic shock. Initial co-ox 53%. Placed on NE - Stable off. Co-ox stable 76%. LA cleared 2.6>1.7  - c/w marked volume overload. Diuresed well with 120 IV lasix BID. Repeat today - Nephrology following - GDMT limited by AKI and soft BP  - not candidate for advanced therapies given noncompliance and commodities including chronic LE wounds and uncontrolled DM    2. Septic Shock  - source = foot wound/  osteomyelitis - on vanc + cefepime + flagyl  - BCx NGTD - ? Obtaining wound cx to identify organism to guide tx  - Ortho following  - NE for BP support, keep MAP > 65 - LA cleared, Cont to trend PCT    3. Chronic  Foots Wounds/ Osteomyelitis w/ Overlying Cellulitis  - management per ortho and WOC  - abx per above    4. AKI - 2/2 mixed shock/hypotension  - SCr peaked  4.88 (b/l 0.8) - SCr remains elevated but improving, 3.5 today but diuresing w/ IV Lasix.  - nephrology following    5. Shock Liver/ Hepatic Congestion  - AST 384>>318  - ALT 188>>189   - Tbili 1.8>>1.5  - continue diuresis per above - co-ox now stable off support  - follow trends    6. Poorly Controlled T2DM w/ Complications - Hgb A1c 10.3  - insulin per CCM    7. Poor Med Compliance - [facility]. Qualifies for HF paramedicine but has previously refused   8. Hypokalemia - 2/2 diuresis? - K 2.9 - replete  - repeat BMET stat, may need more K with ongoing diuresis  9. Mitral valve  - new mobile density on posterior mitral valve leaflet, seen on echo 06/27/23 - would plan for TEE to better access    Length of Stay: 2  Alen Bleacher, NP  06/28/2023, 10:57 AM  Advanced Heart Failure Team Pager [phone] (M-F; 7a - 5p)  Please contact [facility] for night-coverage after hours (5p -7a ) and weekends on [email or web address]    Patient seen and examined with the above-signed Advanced Practice Provider and/or Housestaff. I personally reviewed laboratory data, imaging studies and relevant notes. I independently examined the patient and formulated the important aspects of the plan. I have edited the note to reflect any of my changes or salient points. I have personally discussed the plan with the patient and/or family.  Off NE. Remains on IV lasix. Diuresing well. Scr improving.  Drinking lots of water. Denies SOB. Frustrated and wants to go home  General:  Sitting up in bed No resp difficulty HEENT: normal Neck:  supple. JVP to jaw  Cor:  Regular rate & rhythm. No rubs, gallops or murmurs. Lungs: clear Abdomen: soft, nontender, nondistended. No hepatosplenomegaly. No bruits or masses. Good bowel sounds. Extremities: no cyanosis, clubbing, rash, 2-3+ woody edema + erythema. LE wrapped with gauze Neuro: alert & orientedx3, cranial nerves grossly intact. moves all 4 extremities w/o difficulty. Affect pleasant  Hemodynamically more stable. Continue IV diuresis (has lots of fluid still on board). Discussed need to limit fluid intake. Restart GDMT as BP and renal function allow.  Arvilla Meres, MD  2:23 PM

## 2023-06-28 NOTE — Progress Notes (Signed)
Pharmacy Antibiotic Note  Dakota Ramirez is a 52 y.o. male admitted on 06/25/2023 with concern for sepsis and osteomyelitis.  Pharmacy has been consulted for vancomycin and cefepime dosing.   WBC trending down to 18.8, afebrile SCr trending down to 3.51 (baseline SCr ~1)  Vancomycin random level: 12 mcg/mL (~50 hrs after loading dose)  Plan: Increase cefepime to 2g IV q12h Give vancomycin 1500mg  IV x1, then continue to dose vancomycin per levels for unstable renal function Goal vancomycin trough level ~15-20 mcg/mL Monitor daily CBC, temp, SCr, and for clinical signs of improvement  F/u cultures and de-escalate antibiotics as able   Height: 6' (182.9 cm) Weight: 126.9 kg (279 lb 12.2 oz) IBW/kg (Calculated) : 77.6  Temp (24hrs), Avg:97.3 F (36.3 C), Min:95.4 F (35.2 C), Max:98.2 F (36.8 C)  Recent Labs  Lab 06/25/23 2305 06/25/23 2313 06/26/23 0105 06/26/23 0300 06/26/23 0421 06/26/23 0825 06/27/23 0209 06/28/23 0210  WBC 18.9*  --   --  22.0*  --   --  21.6* 18.8*  CREATININE 4.60*  --   --  4.88*  --   --  4.84* 3.51*  LATICACIDVEN  --  2.6* 2.5*  --  2.0* 1.7  --   --   VANCORANDOM  --   --   --   --   --   --   --  12    Estimated Creatinine Clearance: 34.3 mL/min (A) (by C-G formula based on SCr of 3.51 mg/dL (H)).    No Known Allergies  Antimicrobials this admission: Zosyn 11/12 x1 Cefepime 11/13 >>  Metronidazole 11/13 >> 11/15 Vancomycin 11/13 >>   Dose adjustments this admission: 11/15 - increased cefepime 2g q24h > 2g q12h for improving renal function 11/15 - redosed vancomycin 1500mg  IV x1 for vancomycin level = 64mcg/ml  Microbiology results: 11/12 Bcx x2: 1/3 gram positive cocci (no ID on BCID) 11/13 MRSA PCR: detected  Thank you for allowing pharmacy to be a part of this patient's care.  Wilburn Cornelia, PharmD, BCPS Clinical Pharmacist 06/28/2023 9:52 AM   Please refer to AMION for pharmacy phone number

## 2023-06-28 NOTE — Progress Notes (Signed)
PCCM paged to sign patient out to IMTS to pick up at 7am starting tomorrow at 06/29/2023. Thank you.

## 2023-06-28 NOTE — Progress Notes (Signed)
Patient signed out to internal medicine teaching service

## 2023-06-28 NOTE — Hospital Course (Signed)
Admitted 06/25/2023  Allergies: Patient has no known allergies. Pertinent Hx: HFrEF, type 2 diabetes mellitus, hypertension  52 y.o. male p/w shortness of breath and scrotal edema  *HFrEF exacerbation: Developed shock, had to go to ICU, currently on Lasix drip, resuming GDMT as tolerated, TEE pending  *Osteomyelitis: On treatment with cefepime and vancomycin, likely secondary to diabetes, follows Dr. Lajoyce Corners outpatient  *AKI: Likely cardiorenal, improving with diuresis  Consults: Cardiology, nephrology Meds: Cefepime, vancomycin, Lasix, SSI VTE ppx: Heparin IVF: D5 diet: Heart Healthy    Hold & Call MD if SBP<90, HR<65, RR<10, O2<90, or altered mental status.

## 2023-06-28 NOTE — Progress Notes (Signed)
New mobile density on posterior mitral valve leaflet, seen on echo 06/27/23. Findings discussed and reviewed with Dr. Gala Romney.   Will put patient on for TEE on Monday. Discussed with patient earlier today and he was agreeable to this plan.   Orders placed, plan for NPO at midnight on Sunday.   Brynda Peon, AGACNP-BC  Advanced Heart Failure Team

## 2023-06-29 DIAGNOSIS — R579 Shock, unspecified: Secondary | ICD-10-CM | POA: Diagnosis not present

## 2023-06-29 DIAGNOSIS — I5032 Chronic diastolic (congestive) heart failure: Secondary | ICD-10-CM | POA: Diagnosis not present

## 2023-06-29 DIAGNOSIS — R6521 Severe sepsis with septic shock: Secondary | ICD-10-CM | POA: Diagnosis not present

## 2023-06-29 LAB — GLUCOSE, CAPILLARY
Glucose-Capillary: 185 mg/dL — ABNORMAL HIGH (ref 70–99)
Glucose-Capillary: 191 mg/dL — ABNORMAL HIGH (ref 70–99)
Glucose-Capillary: 221 mg/dL — ABNORMAL HIGH (ref 70–99)
Glucose-Capillary: 223 mg/dL — ABNORMAL HIGH (ref 70–99)
Glucose-Capillary: 241 mg/dL — ABNORMAL HIGH (ref 70–99)
Glucose-Capillary: 334 mg/dL — ABNORMAL HIGH (ref 70–99)

## 2023-06-29 LAB — CBC
HCT: 44.9 % (ref 39.0–52.0)
Hemoglobin: 14.7 g/dL (ref 13.0–17.0)
MCH: 27.5 pg (ref 26.0–34.0)
MCHC: 32.7 g/dL (ref 30.0–36.0)
MCV: 83.9 fL (ref 80.0–100.0)
Platelets: 171 10*3/uL (ref 150–400)
RBC: 5.35 MIL/uL (ref 4.22–5.81)
RDW: 15 % (ref 11.5–15.5)
WBC: 14.5 10*3/uL — ABNORMAL HIGH (ref 4.0–10.5)
nRBC: 0.8 % — ABNORMAL HIGH (ref 0.0–0.2)

## 2023-06-29 LAB — BASIC METABOLIC PANEL
Anion gap: 15 (ref 5–15)
BUN: 68 mg/dL — ABNORMAL HIGH (ref 6–20)
CO2: 27 mmol/L (ref 22–32)
Calcium: 7.6 mg/dL — ABNORMAL LOW (ref 8.9–10.3)
Chloride: 93 mmol/L — ABNORMAL LOW (ref 98–111)
Creatinine, Ser: 2.12 mg/dL — ABNORMAL HIGH (ref 0.61–1.24)
GFR, Estimated: 37 mL/min — ABNORMAL LOW (ref >60)
Glucose, Bld: 113 mg/dL — ABNORMAL HIGH (ref 70–99)
Potassium: 4.1 mmol/L (ref 3.5–5.1)
Sodium: 135 mmol/L (ref 135–145)

## 2023-06-29 LAB — CULTURE, BLOOD (ROUTINE X 2): Special Requests: ADEQUATE

## 2023-06-29 LAB — VANCOMYCIN, RANDOM: Vancomycin Rm: 13 ug/mL

## 2023-06-29 LAB — HEPATIC FUNCTION PANEL
ALT: 140 U/L — ABNORMAL HIGH (ref 0–44)
AST: 149 U/L — ABNORMAL HIGH (ref 15–41)
Albumin: <1.5 g/dL — ABNORMAL LOW (ref 3.5–5.0)
Alkaline Phosphatase: 227 U/L — ABNORMAL HIGH (ref 38–126)
Bilirubin, Direct: 1.1 mg/dL — ABNORMAL HIGH (ref 0.0–0.2)
Indirect Bilirubin: 0.7 mg/dL (ref 0.3–0.9)
Total Bilirubin: 1.8 mg/dL — ABNORMAL HIGH (ref <1.2)
Total Protein: 8 g/dL (ref 6.5–8.1)

## 2023-06-29 MED ORDER — VANCOMYCIN HCL 1750 MG/350ML IV SOLN
1750.0000 mg | Freq: Once | INTRAVENOUS | Status: AC
  Administered 2023-06-29: 1750 mg via INTRAVENOUS
  Filled 2023-06-29: qty 350

## 2023-06-29 MED ORDER — INSULIN ASPART 100 UNIT/ML IJ SOLN
0.0000 [IU] | Freq: Three times a day (TID) | INTRAMUSCULAR | Status: DC
  Administered 2023-06-29: 11 [IU] via SUBCUTANEOUS
  Administered 2023-06-29: 3 [IU] via SUBCUTANEOUS
  Administered 2023-06-30: 5 [IU] via SUBCUTANEOUS
  Administered 2023-06-30: 3 [IU] via SUBCUTANEOUS

## 2023-06-29 MED ORDER — FUROSEMIDE 10 MG/ML IJ SOLN
60.0000 mg | Freq: Once | INTRAMUSCULAR | Status: AC
  Administered 2023-06-29: 60 mg via INTRAVENOUS
  Filled 2023-06-29: qty 6

## 2023-06-29 MED ORDER — INSULIN GLARGINE-YFGN 100 UNIT/ML ~~LOC~~ SOLN
25.0000 [IU] | Freq: Every day | SUBCUTANEOUS | Status: DC
  Administered 2023-06-29: 25 [IU] via SUBCUTANEOUS
  Filled 2023-06-29 (×2): qty 0.25

## 2023-06-29 MED ORDER — ACETAMINOPHEN 500 MG PO TABS
1000.0000 mg | ORAL_TABLET | Freq: Four times a day (QID) | ORAL | Status: DC | PRN
  Administered 2023-06-29: 1000 mg via ORAL
  Filled 2023-06-29: qty 2

## 2023-06-29 MED ORDER — SODIUM CHLORIDE 0.9 % IV SOLN
2.0000 g | Freq: Two times a day (BID) | INTRAVENOUS | Status: DC
  Administered 2023-06-29 – 2023-06-30 (×3): 2 g via INTRAVENOUS
  Filled 2023-06-29 (×3): qty 12.5

## 2023-06-29 MED ORDER — HYDROMORPHONE HCL 2 MG PO TABS
1.0000 mg | ORAL_TABLET | Freq: Once | ORAL | Status: AC
  Administered 2023-06-29: 1 mg via ORAL
  Filled 2023-06-29: qty 1

## 2023-06-29 MED ORDER — LIDOCAINE 5 % EX PTCH
2.0000 | MEDICATED_PATCH | CUTANEOUS | Status: DC
  Administered 2023-06-29 – 2023-07-01 (×3): 2 via TRANSDERMAL
  Filled 2023-06-29 (×3): qty 2

## 2023-06-29 MED ORDER — HYDROMORPHONE HCL 2 MG PO TABS
1.0000 mg | ORAL_TABLET | Freq: Four times a day (QID) | ORAL | Status: DC | PRN
  Administered 2023-06-30 – 2023-07-01 (×4): 1 mg via ORAL
  Filled 2023-06-29 (×4): qty 1

## 2023-06-29 MED ORDER — INSULIN ASPART 100 UNIT/ML IJ SOLN: 0.0000 [IU] | Freq: Every day | INTRAMUSCULAR | Status: DC

## 2023-06-29 MED ORDER — VANCOMYCIN HCL 1500 MG/300ML IV SOLN
1500.0000 mg | Freq: Once | INTRAVENOUS | Status: DC
Start: 2023-06-29 — End: 2023-06-29
  Filled 2023-06-29: qty 300

## 2023-06-29 NOTE — Progress Notes (Signed)
Pharmacy Antibiotic Note  Dakota Ramirez is a 52 y.o. male admitted on 06/25/2023 with concern for sepsis, endocarditis, and osteomyelitis.  Pharmacy has been consulted for vancomycin and cefepime dosing.   WBC trending down to 14.5, afebrile  SCr trending down from 3.01 to 2.12 (baseline SCr ~1) over the last 24 hours  TTE on 11/14 showed a mobile echodensity on the mitral valve, likely endocarditis. This will be confirmed with a TEE on Monday.  Vancomycin random level: 13 mcg/mL (21 hrs after last dose)  Plan: Continue cefepime to 2g IV q12h Give vancomycin 1750mg  IV x1, then continue to dose vancomycin per levels for unstable renal function Vanc random ordered for 11/17 at 1200 to assess given improving renal function Goal vancomycin trough level ~15-20 mcg/mL Monitor daily CBC, temp, SCr, and for clinical signs of improvement  F/u cultures and de-escalate antibiotics as able   Height: 6\' 3"  (190.5 cm) Weight: 119.1 kg (262 lb 9.1 oz) IBW/kg (Calculated) : 84.5  Temp (24hrs), Avg:97.4 F (36.3 C), Min:96.9 F (36.1 C), Max:98.5 F (36.9 C)  Recent Labs  Lab 06/25/23 2305 06/25/23 2313 06/26/23 0105 06/26/23 0300 06/26/23 0421 06/26/23 0825 06/27/23 0209 06/28/23 0210 06/28/23 1405 06/29/23 0806 06/29/23 1047  WBC 18.9*  --   --  22.0*  --   --  21.6* 18.8*  --  14.5*  --   CREATININE 4.60*  --   --  4.88*  --   --  4.84* 3.51* 3.01* 2.12*  --   LATICACIDVEN  --  2.6* 2.5*  --  2.0* 1.7  --   --   --   --   --   VANCORANDOM  --   --   --   --   --   --   --  12  --   --  13    Estimated Creatinine Clearance: 57.3 mL/min (A) (by C-G formula based on SCr of 2.12 mg/dL (H)).    No Known Allergies  Antimicrobials this admission: Zosyn 11/12 x1 Cefepime 11/13 >>  Metronidazole 11/13 >> 11/15 Vancomycin 11/13 >>   Dose adjustments this admission: 11/15 - increased cefepime 2g q24h > 2g q12h for improving renal function 11/15 - redosed vancomycin 1500mg  IV x1 for  vancomycin level = 3mcg/ml 11/16 - redosed vancomycin 1750mg  IV x1 for vanc level 13 mcg/mL  Microbiology results: 11/12 Bcx x2: 1/3 gram positive cocci- micrococcus luteus 11/13 MRSA PCR: detected  Thank you for allowing pharmacy to be a part of this patient's care.  Ernestene Kiel, PharmD PGY1 Pharmacy Resident  Please check AMION for all [facility] phone numbers After 10:00 PM, call Main Pharmacy [phone] 06/29/2023 11:52 AM

## 2023-06-29 NOTE — Progress Notes (Addendum)
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 His course has been complicated by vegetation of MV presumed to be IE and ATN diuresis. IMTS will assume care of this patient  Overnight Events: None   Patient is discontent with being in the hospital. Has been thirsty and tired. Discomfort from scrotal swelling. Denies chest pain or shortness of breath.  Objective:  Vital signs in last 24 hours: Vitals:   06/29/23 0009 06/29/23 0348 06/29/23 0500 06/29/23 0505  BP: 113/77 116/78 92/64 104/60  Pulse: (!) 106 (!) 108 (!) 108 (!) 106  Resp: 15 15 14 20   Temp: 98.5 F (36.9 C) (!) 96.9 F (36.1 C)    TempSrc: Oral Oral    SpO2: 98% 97% 97% 99%  Weight:  119.1 kg    Height:       Supplemental O2: Room Air SpO2: 99 % Filed Weights   06/27/23 0434 06/28/23 0426 06/29/23 0348  Weight: 133.3 kg 126.9 kg 119.1 kg    Physical Exam:  Constitutional: Well appearing main laying in bed in mild distress HENT: normal Neck: JVP to the angle of the mandible today Cardiovascular: RRR, no murmurs. No palpable pulses on bilateral lower extremities Pulmonary/Chest: Clear to auscultation bilaterally. On RA, normal work of breathing. No wheezing Abdominal: Normal BS, soft, non-tender, non-distended.  MSK:1+ pitting edema to midcalf bilaterally. R TMA and LLE covered in bandages GU: Foley catheter present. Scrotal edema present Neurological: alert & oriented x 3 Skin: warm and dry, hemosiderin staining on bilateral lower extremities.     Intake/Output Summary (Last 24 hours) at 06/29/2023 0802 Last data filed  at 06/29/2023 0344 Gross per 24 hour  Intake 4568.63 ml  Output 13244 ml  Net -6081.37 ml   Net IO Since Admission: -15,292.34 mL [06/29/23 0802]  Pertinent Labs:    Latest Ref Rng & Units 06/28/2023    2:10 AM 06/27/2023    2:09 AM 06/26/2023    3:00 AM  CBC  WBC 4.0 - 10.5 K/uL 18.8  21.6  22.0   Hemoglobin 13.0 - 17.0 g/dL 01.0  27.2  53.6   Hematocrit 39.0 - 52.0 % 38.2  39.4  41.8   Platelets 150 - 400 K/uL 196  207  262        Latest Ref Rng & Units 06/28/2023    2:05 PM 06/28/2023    2:10 AM 06/27/2023    2:09 AM  CMP  Glucose 70 - 99 mg/dL 644  034  742   BUN 6 - 20 mg/dL 76  79  84   Creatinine 0.61 - 1.24 mg/dL 5.95  6.38  7.56   Sodium 135 - 145 mmol/L 129  130  124   Potassium 3.5 - 5.1 mmol/L 3.8  2.9  3.7   Chloride 98 - 111 mmol/L 92  97  92   CO2 22 - 32 mmol/L 25  19  18    Calcium 8.9 - 10.3 mg/dL 7.1  6.2  6.3   Total Protein 6.5 - 8.1 g/dL   6.8   Total Bilirubin <1.2 mg/dL  1.5   Alkaline Phos 38 - 126 U/L   174   AST 15 - 41 U/L   318   ALT 0 - 44 U/L   189     Imaging: No results found.  Assessment/Plan:   Principal Problem:   Shock (HCC) Active Problems:   Acute on chronic heart failure with reduced ejection fraction (HFrEF, <= 40%) (HCC)   AKI (acute kidney injury) (HCC)   Hyperkalemia   Gangrene of both feet (HCC)   Sepsis with encephalopathy and septic shock (HCC)  Acute on chronic HFrEF Cardiogenic shock, improved MV mobile mass on TTE Nonischemic cardiomyopathy. Continues to have volume overload during this admission. Initial COOX during this admission at 53%  s/p norepinephrine. Last Coox stable at 76%. TTE 06/27/23 with LVEF 20-25%, LV with global hypokinesis, LA severely dilated, RA mildly dilated, and mobile density on posterior mitral valve leaflet. BP and HR stable today. Patient to undergo TEE on Monday for better visualization of MV mobile mass. RFP today with improvement in renal function. Inclined to continue diuresis  today; however, we may be limited by low oncotic pressure given albumin <1.5.  -S/p 120 mg IV lasix BID on 11/14-15 -Trial of Furosemide 60 mg once today -Appreciate cardiology recommendations -Holding GDMT in setting on ongoing diuresis and low normal blood pressures -Not a candidate for advance therapies due to non adherence and chronic LE wounds -Scrotal sling for scrotal edema, ordered  Sepsis Septic shock, resolved MV mobile mass on TTE, ?concern for IE Osteomyelitics of plantar proximal L foot Gram positive cocci bacteremia, waiting ID panel Etiology R foot wound/osteomyelitis vs overlying cellulitis. Orthopedics consulted; no surgical intervention. Afebrile on antibiotic therapy. WBC 22>21.6>18.8 on 11/15. Today' WBC 14.5. Will need organism and susceptibilities to guide narrow antibiotic therapy 11/12 Blood cx  with gram positive cocci; ID panel pending 11/12 second culture: NGT 3 days -Continue cefepime and vancomycin -TEE Monday 11/18; NPO at midnight  AKI ATN diuresis Baseline 0.8 Cr. Cr 4.6 presentation. Renal ultrasound without obstruction. UA with 1+ protein and small blood. Thought to be secondary to ATN with massive ATN diuresis.  During this admission Cr. 4.6> 4.9 > 4.8 > 3.5 > 3.01 on 11/15. Improvement in Cr today 2.12 after repeat 120 mg IV lasix BID and 11L out overnight. -Strict I/O -Daily labs -Replete electrolytes as needed -Nephrology has signed off  Hypokalemia Received 3 x 40 mEq PO and 4 rounds of IV. K today 4.1 -Follow up RFP and replete as needed  Shock liver Congestive hepatopathy Overall improving per chart review -FU hepatic function panel today -Continue diuresis  Uncontrolled DM A1c 10.3 -SSI  Hyponatremia  Likely secondary to hypervolemia, improving 122>126>124>129 yesterday. 135 today.  No neuro symptoms -CTM  Decubitus ulcer to bilateral LE -Wound care following  Diet: Heart Healthy VTE: Heparin Code: Full PT/OT recs:  pending  Dispo: Anticipated discharge pending clinical improvement  Morene Crocker, MD Internal Medicine Resident PGY-2 Please contact the on call pager after 5 pm and on weekends at [phone].

## 2023-06-29 NOTE — Progress Notes (Signed)
Progress Note  Patient Name: Dakota Ramirez Date of Encounter: 06/29/2023 Primary Cardiologist: None   Subjective   Overnight negative 8 liters. No CP, SOB, Palpitations.  Vital Signs    Vitals:   06/29/23 0831 06/29/23 0936 06/29/23 1125 06/29/23 1212  BP: 98/68  (!) 121/92 118/86  Pulse: (!) 109  (!) 103 (!) 102  Resp: 14  20 17   Temp: (!) 96.9 F (36.1 C)  (!) 97 F (36.1 C) 97.7 F (36.5 C)  TempSrc: Oral  Oral Oral  SpO2: 96% 98% 100% 100%  Weight:      Height:        Intake/Output Summary (Last 24 hours) at 06/29/2023 1425 Last data filed at 06/29/2023 0834 Gross per 24 hour  Intake 2079.45 ml  Output 13086 ml  Net -8470.55 ml   Filed Weights   06/27/23 0434 06/28/23 0426 06/29/23 0348  Weight: 133.3 kg 126.9 kg 119.1 kg    Physical Exam   GENERAL: Obese. CHEST: Decreased breath sounds at bases. CARDIOVASCULAR: Regular tachycardia with a systolic murmur. Jugular venous distention. ABDOMEN: Distended. EXTREMITIES: Bilateral edema. SKIN: Multiple scars and marks on face and arms.  Labs   Telemetry: SR to sinus tachycardia   Chemistry Recent Labs  Lab 06/25/23 2305 06/26/23 0300 06/27/23 0209 06/28/23 0210 06/28/23 1405 06/29/23 0806  NA 122*   < > 124* 130* 129* 135  K 5.9*   < > 3.7 2.9* 3.8 4.1  CL 92*   < > 92* 97* 92* 93*  CO2 16*   < > 18* 19* 25 27  GLUCOSE 173*   < > 138* 115* 188* 113*  BUN 78*   < > 84* 79* 76* 68*  CREATININE 4.60*   < > 4.84* 3.51* 3.01* 2.12*  CALCIUM 6.3*   < > 6.3* 6.2* 7.1* 7.6*  PROT 7.1  --  6.8  --   --  8.0  ALBUMIN 1.5*  --  <1.5*  --   --  <1.5*  AST 384*  --  318*  --   --  149*  ALT 188*  --  189*  --   --  140*  ALKPHOS 172*  --  174*  --   --  227*  BILITOT 1.8*  --  1.5*  --   --  1.8*  GFRNONAA 15*   < > 14* 20* 24* 37*  ANIONGAP 14   < > 14 14 12 15    < > = values in this interval not displayed.     Hematology Recent Labs  Lab 06/27/23 0209 06/28/23 0210 06/29/23 0806  WBC 21.6*  18.8* 14.5*  RBC 4.62 4.55 5.35  HGB 12.9* 12.9* 14.7  HCT 39.4 38.2* 44.9  MCV 85.3 84.0 83.9  MCH 27.9 28.4 27.5  MCHC 32.7 33.8 32.7  RDW 15.0 14.7 15.0  PLT 207 196 171    Cardiac EnzymesNo results for input(s): "TROPONINI" in the last 168 hours. No results for input(s): "TROPIPOC" in the last 168 hours.   BNP Recent Labs  Lab 06/25/23 2300  BNP 1,991.5*     DDimer No results for input(s): "DDIMER" in the last 168 hours.   Cardiac Studies   Cardiac Studies & Procedures   CARDIAC CATHETERIZATION  CARDIAC CATHETERIZATION 11/30/2021  Narrative   The left ventricular ejection fraction is less than 25% by visual estimate.  Findings:  Ao = 97/67 (85) LV = 105/25 RA = 8 RV = 37/14 PA = 35/22 (25)  PCW = 20 Fick cardiac output/index = 5.9/2.4 PVR = 0.8 WU SVR = 1040 Ao sat = 97% PA sat = 66%, 67%  Assessment: 1. Normal coronary arteries 2. 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 Mildly elevated filling pressures with normal cardiac output  Plan/Discussion:  Continue medical therapy.  Arvilla Meres, MD 5:01 PM  Findings Coronary Findings Diagnostic  Dominance: Left  Left Main Vessel is angiographically normal.  Left Anterior Descending Vessel is angiographically normal.  Ramus Intermedius Vessel is angiographically normal.  Left Circumflex Vessel is angiographically normal.  Right Coronary Artery Vessel is angiographically normal.  Intervention  No interventions have been documented.     ECHOCARDIOGRAM  ECHOCARDIOGRAM COMPLETE 06/27/2023  Narrative ECHOCARDIOGRAM REPORT    Patient Name:   Dakota Ramirez Date of Exam: 06/27/2023 Medical Rec #:  [id number]    Height:       72.0 in Accession #:    [id number]   Weight:       293.9 lb Date of Birth:  06-27-1971    BSA:          2.509 m Patient Age:    52 years     BP:           92/67 mmHg Patient Gender: M            HR:           84 bpm. Exam Location:  Inpatient  Procedure: 2D Echo, Color  Doppler and Cardiac Doppler  Indications:    Septic Shock  History:        Patient has prior history of Echocardiogram examinations, most recent 12/27/2022. CHF, PVD, Signs/Symptoms:Sepsis; Risk Factors:Hypertension.  Sonographer:    Milbert Coulter Referring Phys: [id number] Clarene Critchley HOFFMAN  IMPRESSIONS   1. Left ventricular ejection fraction, by estimation, is 20 to 25%. The left ventricle has severely decreased function. The left ventricle demonstrates global hypokinesis. The left ventricular internal cavity size was mildly dilated. Left ventricular diastolic parameters are consistent with Grade II diastolic dysfunction (pseudonormalization). 2. Right ventricular systolic function is mildly reduced. The right ventricular size is moderately enlarged. There is mildly elevated pulmonary artery systolic pressure. 3. Left atrial size was severely dilated. 4. Right atrial size was mildly dilated. 5. Compared with the echo 12/2022, the mitral valve is thickened and there appears to be a mobile density on the posterior mitral valve leaflet measuring 1.59 x 0.61 cm. Recommend TEE to better evaluate. The mitral valve is normal in structure. Mild mitral valve regurgitation. No evidence of mitral stenosis. 6. The aortic valve is tricuspid. Aortic valve regurgitation is not visualized. No aortic stenosis is present. 7. The inferior vena cava is dilated in size with <50% respiratory variability, suggesting right atrial pressure of 15 mmHg.  FINDINGS Left Ventricle: Left ventricular ejection fraction, by estimation, is 20 to 25%. The left ventricle has severely decreased function. The left ventricle demonstrates global hypokinesis. The left ventricular internal cavity size was mildly dilated. There is no left ventricular hypertrophy. Left ventricular diastolic parameters are consistent with Grade II diastolic dysfunction (pseudonormalization). Indeterminate filling pressures.  Right Ventricle: The right  ventricular size is moderately enlarged. No increase in right ventricular wall thickness. Right ventricular systolic function is mildly reduced. There is mildly elevated pulmonary artery systolic pressure. The tricuspid regurgitant velocity is 2.64 m/s, and with an assumed right atrial pressure of 15 mmHg, the estimated right ventricular systolic pressure is 42.9 mmHg.  Left Atrium: Left atrial size was severely dilated.  Right  Atrium: Right atrial size was mildly dilated.  Pericardium: There is no evidence of pericardial effusion.  Mitral Valve: Compared with the echo 12/2022, the mitral valve is thickened and there appears to be a mobile density on the posterior mitral valve leaflet measuring 1.59 x 0.61 cm. Recommend TEE to better evaluate. The mitral valve is normal in structure. Mild mitral valve regurgitation. No evidence of mitral valve stenosis.  Tricuspid Valve: The tricuspid valve is normal in structure. Tricuspid valve regurgitation is mild . No evidence of tricuspid stenosis.  Aortic Valve: The aortic valve is tricuspid. Aortic valve regurgitation is not visualized. No aortic stenosis is present. Aortic valve mean gradient measures 3.0 mmHg. Aortic valve peak gradient measures 4.8 mmHg. Aortic valve area, by VTI measures 1.77 cm.  Pulmonic Valve: The pulmonic valve was normal in structure. Pulmonic valve regurgitation is not visualized. No evidence of pulmonic stenosis.  Aorta: The aortic root is normal in size and structure.  Venous: The inferior vena cava is dilated in size with less than 50% respiratory variability, suggesting right atrial pressure of 15 mmHg.  IAS/Shunts: No atrial level shunt detected by color flow Doppler.   LEFT VENTRICLE PLAX 2D LVIDd:         6.10 cm   Diastology LVIDs:         5.50 cm   LV e' medial:    7.29 cm/s LV PW:         1.10 cm   LV E/e' medial:  12.4 LV IVS:        0.90 cm   LV e' lateral:   12.70 cm/s LVOT diam:     1.70 cm   LV E/e'  lateral: 7.1 LV SV:         33 LV SV Index:   13 LVOT Area:     2.27 cm   RIGHT VENTRICLE RV Basal diam:  5.70 cm RV Mid diam:    5.70 cm RV S prime:     10.60 cm/s TAPSE (M-mode): 1.6 cm  LEFT ATRIUM              Index        RIGHT ATRIUM           Index LA diam:        4.70 cm  1.87 cm/m   RA Area:     24.70 cm LA Vol (A2C):   71.8 ml  28.62 ml/m  RA Volume:   77.30 ml  30.81 ml/m LA Vol (A4C):   104.0 ml 41.45 ml/m LA Biplane Vol: 88.7 ml  35.35 ml/m AORTIC VALVE AV Area (Vmax):    1.91 cm AV Area (Vmean):   1.64 cm AV Area (VTI):     1.77 cm AV Vmax:           109.00 cm/s AV Vmean:          82.200 cm/s AV VTI:            0.188 m AV Peak Grad:      4.8 mmHg AV Mean Grad:      3.0 mmHg LVOT Vmax:         91.80 cm/s LVOT Vmean:        59.300 cm/s LVOT VTI:          0.147 m LVOT/AV VTI ratio: 0.78  AORTA Ao Root diam: 2.60 cm Ao Asc diam:  3.50 cm  MITRAL VALVE  TRICUSPID VALVE MV Area (PHT): 5.31 cm       TR Peak grad:   27.9 mmHg MV Decel Time: 143 msec       TR Vmax:        264.00 cm/s MR Peak grad:    76.7 mmHg MR Mean grad:    51.0 mmHg    SHUNTS MR Vmax:         438.00 cm/s  Systemic VTI:  0.15 m MR Vmean:        340.0 cm/s   Systemic Diam: 1.70 cm MR PISA:         2.26 cm MR PISA Eff ROA: 16 mm MR PISA Radius:  0.60 cm MV E velocity: 90.50 cm/s MV A velocity: 61.60 cm/s MV E/A ratio:  1.47  Chilton Si MD Electronically signed by Chilton Si MD Signature Date/Time: 06/27/2023/2:38:10 PM    Final      CARDIAC MRI  MR CARDIAC MORPHOLOGY W WO CONTRAST 12/04/2022  Narrative CLINICAL DATA:  14M with HFrEF (20-25%), cath showed normal coronaries  EXAM: CARDIAC MRI  TECHNIQUE: The patient was scanned on a 1.5 Tesla Siemens magnet. A dedicated cardiac coil was used. Functional imaging was done using Fiesta sequences. 2,3, and 4 chamber views were done to assess for RWMA's. Modified Simpson's rule using a  short axis stack was used to calculate an ejection fraction on a dedicated work [employer]. The patient received 15 cc of Gadavist. After 10 minutes inversion recovery sequences were used to assess for infiltration and scar tissue. Phase contrast velocity mapping was performed  CONTRAST:  15 cc  of Gadavist  FINDINGS: Left ventricle:  -Severe dilatation  -Severe systolic dysfunction  -Elevated ECV (34%), though accuracy affected by poor quality of T1 maps  -RV insertion site LGE  -Basal septal midwall LGE  LV EF: 17% (Normal 49-79%)  Absolute volumes:  LV EDV: (Normal 95-215 mL)  LV ESV: (Normal 25-85 mL)  LV SV: 69mL (Normal 61-145 mL)  CO: 8.2L/min (Normal 3.4-7.8 L/min)  Indexed volumes:  LV EDV: 121mL/sq-m (Normal 50-108 mL/sq-m)  LV ESV: 148mL/sq-m (Normal 11-47 mL/sq-m)  LV SV: 49mL/sq-m (Normal 33-72 mL/sq-m)  CI: 3.3L/min/sq-m (Normal 1.8-4.2 L/min/sq-m)  Right ventricle:  -Severe dilatation  -Severe systolic dysfunction  RV EF:  40% (Normal 51-80%)  Absolute volumes:  RV EDV: (Normal 109-217 mL)  RV ESV: (Normal 23-91 mL)  RV SV: 99mL (Normal 71-141 mL)  CO: 11.7L/min (Normal 2.8-8.8 L/min)  Indexed volumes:  RV EDV: 143mL/sq-m (Normal 58-109 mL/sq-m)  RV ESV: 113mL/sq-m (Normal 12-46 mL/sq-m)  RV SV: 69mL/sq-m (Normal 38-71 mL/sq-m)  CI: 4.8L/min/sq-m (Normal 1.7-4.2 L/min/sq-m)  Left atrium: Moderate enlargement  Right atrium: Moderate enlargement  Mitral valve: Mild regurgitation visually, unable to quantify due to poor quality phase contrast velocity mapping  Aortic valve: Trivial regurgitation visually  Tricuspid valve: Mild to moderate visually, unable to quantify due to poor quality phase contrast velocity mapping  Pulmonic valve: Not well visualized  Aorta: Normal proximal ascending aorta  Pulmonary artery: Dilated main pulmonary artery measuring 31mm  Pericardium:  Small effusion  IMPRESSION: 1. Poor quality study. There was significant motion artifact throughout study  2. Severe LV dilatation with severe systolic dysfunction (EF 17%), though accuracy of EF/volume quantification is affected by artifact  3. Severe RV dilatation with severe systolic dysfunction (EF 22%), though accuracy of EF/volume quantification is affected by artifact  4. Late gadolinium enhancement imaging is very poor quality, which  limits interpretation. Appears to have RV insertion site LGE (which is a nonspecific scar finding often seen in setting of elevated pulmonary pressures) and basal septal midwall LGE (which is a scar pattern seen in nonischemic cardiomyopathies and associated with worse prognosis)  5.  Small pericardial effusion  6.  Dilated main pulmonary artery measuring 31mm   Electronically Signed By: Epifanio Lesches M.D. On: 12/06/2022 18:35              Assessment & Plan   Acute on Chronic Systolic Heart Failure - Presented with cardiogenic shock, requiring high dose diuretics.  -  Creatinine and transaminitis improving with diuresis. Not a candidate for advanced heart failure therapies due to lower extremity wounds and uncontrolled diabetes.  - Planned for transesophageal echocardiogram to rule out infective endocarditis.  - Consent obtained on 06/28/2023 with Miss Trisha Mangle NP - Continue high dose diuretics - Transesophageal echocardiogram to rule out infective endocarditis - GDMT as tolerated with recovering transaminitis and AKI, with hyponatremia  Septic Shock related to Wound and Osteomyelitis - Orthopedic surgery and primary team managing septic shock related to wound and osteomyelitis.  Type 2 Diabetes Mellitus Internal medicine team managing blood sugars. - Continue blood sugar management by internal medicine team   For questions or updates, please contact [facility] Please consult [email or web address] for contact info under  Cardiology/STEMI.      Riley Lam, MD FASE [facility] Cardiologist [facility]  [address] [place] [place], [place] [address] [phone]  2:25 PM

## 2023-06-29 NOTE — Progress Notes (Signed)
Occupational Therapy Treatment Patient Details Name: Dakota Ramirez MRN: [id number] DOB: 21-Feb-1971 Today's Date: 06/29/2023   History of present illness Patient is a 52 y/o male admitted 06/26/23 with SOB and scrotal edema.  Found to be hypotensive with AKI and concern for sepsis with foot wounds. Recent MCA 11/10.  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   OT comments  Pt progressing towards goals, performing toileting and pericare with supervision- CGA. Pt CGA for bed mobility and transfers/ambulation in room with RW. Pt still with discomfort in back and scrotal area. Provided pt with scrotal sling using mesh underwear and gait belt, pt reports relief and ease with mobility when sling on. Educated pt on when to wear and pt verbalized understanding. Pt presenting with impairments listed below, will follow acutely. Anticipate no OT follow up needs at d/c.       If plan is discharge home, recommend the following:  A little help with walking and/or transfers;A lot of help with bathing/dressing/bathroom;Assistance with cooking/housework;Assist for transportation;Help with stairs or ramp for entrance;Direct supervision/assist for medications management   Equipment Recommendations  None recommended by OT    Recommendations for Other Services      Precautions / Restrictions Precautions Precautions: Fall Precaution Comments: R transmet amp; scrotal edema, watch BP Required Braces or Orthoses: Other Brace Other Brace: scrotal sling when OOB/as needed Restrictions Weight Bearing Restrictions: No       Mobility Bed Mobility Overal bed mobility: Needs Assistance Bed Mobility: Supine to Sit     Supine to sit: Contact guard          Transfers Overall transfer level: Needs assistance Equipment used: Rolling walker (2 wheels) Transfers: Sit to/from Stand Sit to Stand:  Contact guard assist     Step pivot transfers: Min assist           Balance Overall balance assessment: Needs assistance Sitting-balance support: No upper extremity supported, Feet supported Sitting balance-Leahy Scale: Good     Standing balance support: Bilateral upper extremity supported Standing balance-Leahy Scale: Poor                             ADL either performed or assessed with clinical judgement   ADL Overall ADL's : Needs assistance/impaired                         Toilet Transfer: Contact guard assist;Ambulation;Regular Toilet;Rolling walker (2 wheels)   Toileting- Clothing Manipulation and Hygiene: Supervision/safety Toileting - Clothing Manipulation Details (indicate cue type and reason): pericare seated on commode     Functional mobility during ADLs: Contact guard assist;Rolling walker (2 wheels)      Extremity/Trunk Assessment Upper Extremity Assessment Upper Extremity Assessment: Overall WFL for tasks assessed   Lower Extremity Assessment Lower Extremity Assessment: Defer to PT evaluation        Vision       Perception Perception Perception: Not tested   Praxis Praxis Praxis: Not tested    Cognition Arousal: Alert Behavior During Therapy: Agitated Overall Cognitive Status: Impaired/Different from baseline Area of Impairment: Awareness                               General Comments: pt agitated and perseverative on wanting to leave, however agreeable  to making scrotal sling        Exercises      Shoulder Instructions       General Comments scrotal sling made for pt with mesh underwear and use of gait belt, mesh underwear placed vertically with gait belt through one "leg" and pillow case and scrotum placed on other side, gait belt adjusted to fit pt's waist    Pertinent Vitals/ Pain       Pain Assessment Pain Assessment: Faces Faces Pain Scale: Hurts even more Pain Location: scrotum,  back Pain Descriptors / Indicators: Grimacing, Guarding Pain Intervention(s): Limited activity within patient's tolerance, Monitored during session, Repositioned  Home Living                                          Prior Functioning/Environment              Frequency  Min 1X/week        Progress Toward Goals  OT Goals(current goals can now be found in the care plan section)  Progress towards OT goals: Progressing toward goals  Acute Rehab OT Goals OT Goal Formulation: With patient Time For Goal Achievement: 07/11/23 Potential to Achieve Goals: Good ADL Goals Pt Will Perform Grooming: with modified independence;standing Pt Will Perform Lower Body Bathing: with modified independence;sit to/from stand;with adaptive equipment Pt Will Perform Lower Body Dressing: with modified independence;with adaptive equipment;sit to/from stand Pt Will Transfer to Toilet: with modified independence;ambulating;bedside commode Pt Will Perform Toileting - Clothing Manipulation and hygiene: with modified independence;sit to/from stand Additional ADL Goal #1: Pt will complete bed mobility modified independent.  Plan      Co-evaluation                 AM-PAC OT "6 Clicks" Daily Activity     Outcome Measure   Help from another person eating meals?: None Help from another person taking care of personal grooming?: A Little Help from another person toileting, which includes using toliet, bedpan, or urinal?: A Little Help from another person bathing (including washing, rinsing, drying)?: A Lot Help from another person to put on and taking off regular upper body clothing?: A Little Help from another person to put on and taking off regular lower body clothing?: A Lot 6 Click Score: 17    End of Session Equipment Utilized During Treatment: Rolling walker (2 wheels)  OT Visit Diagnosis: Unsteadiness on feet (R26.81);Other abnormalities of gait and mobility  (R26.89);Pain;Muscle weakness (generalized) (M62.81);Other (comment)   Activity Tolerance Patient limited by pain   Patient Left in bed;with call bell/phone within reach;with bed alarm set;with nursing/sitter in room   Nurse Communication Mobility status;Other (comment) (scrotal sling)        Time: 1610-9604 OT Time Calculation (min): 26 min  Charges: OT General Charges $OT Visit: 1 Visit OT Treatments $Self Care/Home Management : 23-37 mins  Carver Fila, OTD, OTR/L SecureChat Preferred Acute Rehab [phone]   Carver Fila Koonce 06/29/2023, 12:19 PM

## 2023-06-29 NOTE — Plan of Care (Signed)

## 2023-06-30 DIAGNOSIS — Z91199 Patient's noncompliance with other medical treatment and regimen due to unspecified reason: Secondary | ICD-10-CM

## 2023-06-30 DIAGNOSIS — I330 Acute and subacute infective endocarditis: Secondary | ICD-10-CM | POA: Insufficient documentation

## 2023-06-30 DIAGNOSIS — E8809 Other disorders of plasma-protein metabolism, not elsewhere classified: Secondary | ICD-10-CM | POA: Insufficient documentation

## 2023-06-30 DIAGNOSIS — K769 Liver disease, unspecified: Secondary | ICD-10-CM | POA: Insufficient documentation

## 2023-06-30 DIAGNOSIS — R579 Shock, unspecified: Secondary | ICD-10-CM | POA: Diagnosis not present

## 2023-06-30 DIAGNOSIS — R7881 Bacteremia: Secondary | ICD-10-CM | POA: Insufficient documentation

## 2023-06-30 LAB — CBC
HCT: 39.9 % (ref 39.0–52.0)
Hemoglobin: 12.7 g/dL — ABNORMAL LOW (ref 13.0–17.0)
MCH: 26.9 pg (ref 26.0–34.0)
MCHC: 31.8 g/dL (ref 30.0–36.0)
MCV: 84.5 fL (ref 80.0–100.0)
Platelets: 181 10*3/uL (ref 150–400)
RBC: 4.72 MIL/uL (ref 4.22–5.81)
RDW: 15 % (ref 11.5–15.5)
WBC: 10.4 10*3/uL (ref 4.0–10.5)
nRBC: 0.7 % — ABNORMAL HIGH (ref 0.0–0.2)

## 2023-06-30 LAB — GLUCOSE, CAPILLARY
Glucose-Capillary: 159 mg/dL — ABNORMAL HIGH (ref 70–99)
Glucose-Capillary: 195 mg/dL — ABNORMAL HIGH (ref 70–99)
Glucose-Capillary: 199 mg/dL — ABNORMAL HIGH (ref 70–99)
Glucose-Capillary: 206 mg/dL — ABNORMAL HIGH (ref 70–99)
Glucose-Capillary: 220 mg/dL — ABNORMAL HIGH (ref 70–99)
Glucose-Capillary: 305 mg/dL — ABNORMAL HIGH (ref 70–99)
Glucose-Capillary: 333 mg/dL — ABNORMAL HIGH (ref 70–99)

## 2023-06-30 LAB — HEPATIC FUNCTION PANEL
ALT: 103 U/L — ABNORMAL HIGH (ref 0–44)
AST: 83 U/L — ABNORMAL HIGH (ref 15–41)
Albumin: <1.5 g/dL — ABNORMAL LOW (ref 3.5–5.0)
Alkaline Phosphatase: 180 U/L — ABNORMAL HIGH (ref 38–126)
Bilirubin, Direct: 0.7 mg/dL — ABNORMAL HIGH (ref 0.0–0.2)
Indirect Bilirubin: 0.8 mg/dL (ref 0.3–0.9)
Total Bilirubin: 1.5 mg/dL — ABNORMAL HIGH (ref <1.2)
Total Protein: 7.2 g/dL (ref 6.5–8.1)

## 2023-06-30 LAB — CULTURE, BLOOD (ROUTINE X 2)
Culture: NO GROWTH
Special Requests: ADEQUATE

## 2023-06-30 LAB — RENAL FUNCTION PANEL
Albumin: <1.5 g/dL — ABNORMAL LOW (ref 3.5–5.0)
Anion gap: 9 (ref 5–15)
BUN: 52 mg/dL — ABNORMAL HIGH (ref 6–20)
CO2: 31 mmol/L (ref 22–32)
Calcium: 7 mg/dL — ABNORMAL LOW (ref 8.9–10.3)
Chloride: 92 mmol/L — ABNORMAL LOW (ref 98–111)
Creatinine, Ser: 1.29 mg/dL — ABNORMAL HIGH (ref 0.61–1.24)
GFR, Estimated: >60 mL/min (ref >60)
Glucose, Bld: 190 mg/dL — ABNORMAL HIGH (ref 70–99)
Phosphorus: 3.2 mg/dL (ref 2.5–4.6)
Potassium: 3.3 mmol/L — ABNORMAL LOW (ref 3.5–5.1)
Sodium: 132 mmol/L — ABNORMAL LOW (ref 135–145)

## 2023-06-30 LAB — MAGNESIUM: Magnesium: 1 mg/dL — ABNORMAL LOW (ref 1.7–2.4)

## 2023-06-30 LAB — COOXEMETRY PANEL
Carboxyhemoglobin: 2.2 % — ABNORMAL HIGH (ref 0.5–1.5)
Methemoglobin: 0.8 % (ref 0.0–1.5)
O2 Saturation: 97 %
Total hemoglobin: 13.5 g/dL (ref 12.0–16.0)

## 2023-06-30 LAB — VANCOMYCIN, RANDOM: Vancomycin Rm: 13 ug/mL

## 2023-06-30 MED ORDER — INSULIN GLARGINE-YFGN 100 UNIT/ML ~~LOC~~ SOLN
30.0000 [IU] | Freq: Every day | SUBCUTANEOUS | Status: DC
  Filled 2023-06-30: qty 0.3

## 2023-06-30 MED ORDER — FUROSEMIDE 10 MG/ML IJ SOLN: 40.0000 mg | Freq: Once | INTRAMUSCULAR | Status: DC

## 2023-06-30 MED ORDER — MAGNESIUM SULFATE 2 GM/50ML IV SOLN
INTRAVENOUS | Status: AC
  Filled 2023-06-30: qty 50

## 2023-06-30 MED ORDER — INSULIN GLARGINE-YFGN 100 UNIT/ML ~~LOC~~ SOLN
35.0000 [IU] | Freq: Every day | SUBCUTANEOUS | Status: DC
  Administered 2023-06-30: 35 [IU] via SUBCUTANEOUS
  Filled 2023-06-30 (×2): qty 0.35

## 2023-06-30 MED ORDER — POTASSIUM CHLORIDE CRYS ER 20 MEQ PO TBCR
40.0000 meq | EXTENDED_RELEASE_TABLET | Freq: Once | ORAL | Status: AC
  Administered 2023-06-30: 40 meq via ORAL
  Filled 2023-06-30: qty 2

## 2023-06-30 MED ORDER — SODIUM CHLORIDE 0.9 % IV SOLN
2.0000 g | Freq: Three times a day (TID) | INTRAVENOUS | Status: DC
  Administered 2023-06-30 – 2023-07-01 (×3): 2 g via INTRAVENOUS
  Filled 2023-06-30 (×3): qty 12.5

## 2023-06-30 MED ORDER — MAGNESIUM SULFATE 2 GM/50ML IV SOLN
2.0000 g | Freq: Once | INTRAVENOUS | Status: AC
  Administered 2023-06-30: 2 g via INTRAVENOUS

## 2023-06-30 MED ORDER — FUROSEMIDE 10 MG/ML IJ SOLN
60.0000 mg | Freq: Once | INTRAMUSCULAR | Status: AC
  Administered 2023-06-30: 60 mg via INTRAVENOUS
  Filled 2023-06-30: qty 6

## 2023-06-30 MED ORDER — INSULIN ASPART 100 UNIT/ML IJ SOLN
0.0000 [IU] | Freq: Every day | INTRAMUSCULAR | Status: DC
  Administered 2023-06-30: 4 [IU] via SUBCUTANEOUS

## 2023-06-30 MED ORDER — VANCOMYCIN HCL 2000 MG/400ML IV SOLN
2000.0000 mg | Freq: Once | INTRAVENOUS | Status: AC
  Administered 2023-06-30: 2000 mg via INTRAVENOUS
  Filled 2023-06-30: qty 400

## 2023-06-30 MED ORDER — MAGNESIUM SULFATE 2 GM/50ML IV SOLN
2.0000 g | Freq: Once | INTRAVENOUS | Status: AC
  Administered 2023-06-30: 2 g via INTRAVENOUS
  Filled 2023-06-30: qty 50

## 2023-06-30 MED ORDER — INSULIN ASPART 100 UNIT/ML IJ SOLN
0.0000 [IU] | Freq: Three times a day (TID) | INTRAMUSCULAR | Status: DC
  Administered 2023-06-30: 4 [IU] via SUBCUTANEOUS

## 2023-06-30 MED ORDER — INSULIN ASPART 100 UNIT/ML IJ SOLN
8.0000 [IU] | Freq: Once | INTRAMUSCULAR | Status: AC
  Administered 2023-06-30: 8 [IU] via SUBCUTANEOUS

## 2023-06-30 NOTE — Progress Notes (Signed)
Mobility Specialist Progress Note:   06/30/23 1600  Mobility  Activity Transferred from bed to chair  Level of Assistance Contact guard assist, steadying assist  Assistive Device None;Other (Comment) (HHA)  Distance Ambulated (ft) 3 ft  Activity Response Tolerated well  Mobility Referral Yes  $Mobility charge 1 Mobility  Mobility Specialist Start Time (ACUTE ONLY) 1600  Mobility Specialist Stop Time (ACUTE ONLY) 1615  Mobility Specialist Time Calculation (min) (ACUTE ONLY) 15 min   Responded to bed alarm, found patient halfway out of bed. Requesting to transfer to chair. Required only minG assist to transfer. Pt c/o minor back discomfort, otherwise asx. Pt left with all needs met, alarm on.  Addison Lank Mobility Specialist Please contact via SecureChat or  Rehab office at [phone]

## 2023-06-30 NOTE — Progress Notes (Signed)
Pharmacy Antibiotic Note  Dakota Ramirez is a 52 y.o. male admitted on 06/25/2023 with concern for sepsis, endocarditis, and osteomyelitis.  Pharmacy has been consulted for vancomycin and cefepime dosing.   WBC trending down to 10.4, afebrile  SCr trending down from 2.12 to 1.29 (baseline SCr ~1) over the last 24 hours  TTE on 11/14 showed a mobile echodensity on the mitral valve, likely endocarditis. This will be confirmed with a TEE on Monday.  Vancomycin random level: 13 mcg/mL (23 hrs after last dose) Plan for ID consult on Monday.  Plan: Renally dose adjust cefepime to 2g IV q8h Give vancomycin 2000mg  IV x1 today Consider starting a scheduled regimen tomorrow if renal function stabilizes Vanc random ordered for 11/18 with morning labs Goal vancomycin trough level ~15-20 mcg/mL Monitor daily CBC, temp, SCr, and for clinical signs of improvement  F/u cultures and de-escalate antibiotics as able   Height: 6\' 3"  (190.5 cm) Weight: 116.1 kg (255 lb 15.3 oz) IBW/kg (Calculated) : 84.5  Temp (24hrs), Avg:97.9 F (36.6 C), Min:97.7 F (36.5 C), Max:98.2 F (36.8 C)  Recent Labs  Lab 06/25/23 2313 06/26/23 0105 06/26/23 0300 06/26/23 0421 06/26/23 0825 06/27/23 0209 06/28/23 0210 06/28/23 0210 06/28/23 1405 06/29/23 0806 06/29/23 1047 06/30/23 0324 06/30/23 0326 06/30/23 1233  WBC  --   --  22.0*  --   --  21.6* 18.8*  --   --  14.5*  --  10.4  --   --   CREATININE  --   --  4.88*  --   --  4.84* 3.51*  --  3.01* 2.12*  --   --  1.29*  --   LATICACIDVEN 2.6* 2.5*  --  2.0* 1.7  --   --   --   --   --   --   --   --   --   VANCORANDOM  --   --   --   --   --   --  12   < >  --   --  13  --   --  13   < > = values in this interval not displayed.    Estimated Creatinine Clearance: 93 mL/min (A) (by C-G formula based on SCr of 1.29 mg/dL (H)).    No Known Allergies  Antimicrobials this admission: Zosyn 11/12 x1 Cefepime 11/13 >>  Metronidazole 11/13 >>  11/15 Vancomycin 11/13 >>   Dose adjustments this admission: 11/15 - increased cefepime 2g q24h > 2g q12h for improving renal function 11/15 - redosed vancomycin 1500mg  IV x1 for vancomycin level = 32mcg/ml 11/16 - redosed vancomycin 1750mg  IV x1 for vanc level 13 mcg/mL 11/17 - redosed vancomycin 2000mg  IV x1 for vanc level 13 mcg/mL 11/17 - increased cefepime to 2g q8h for improving renal function  Microbiology results: 11/12 Bcx x2: 1/3 gram positive cocci- micrococcus luteus 11/13 MRSA PCR: detected  Thank you for allowing pharmacy to be a part of this patient's care.  Ernestene Kiel, PharmD PGY1 Pharmacy Resident  Please check AMION for all [facility] Pharmacy phone numbers After 10:00 PM, call Main Pharmacy [phone] 06/30/2023 2:02 PM

## 2023-06-30 NOTE — Plan of Care (Signed)
  Problem: Education: Goal: Ability to describe self-care measures that may prevent or decrease complications (Diabetes Survival Skills Education) will improve Outcome: Progressing Goal: Individualized Educational Video(s) Outcome: Progressing   Problem: Coping: Goal: Ability to adjust to condition or change in health will improve Outcome: Progressing   

## 2023-06-30 NOTE — Plan of Care (Signed)

## 2023-06-30 NOTE — Progress Notes (Signed)
   Rounding Note    Patient Name: Dakota Ramirez Date of Encounter: 06/30/2023  [facility] Cardiologist: None  Subjective   NAEO. Complains of dry mouth this AM.  Vital Signs    Vitals:   06/29/23 1926 06/29/23 2357 06/30/23 0418 06/30/23 0500  BP: 122/88 (!) 128/94 119/85   Pulse: (!) 103  (!) 110   Resp: 17 20 20    Temp: 97.9 F (36.6 C) 97.8 F (36.6 C) 98 F (36.7 C)   TempSrc: Oral Oral Oral   SpO2: 98% 100% 97%   Weight:    116.1 kg  Height:        Intake/Output Summary (Last 24 hours) at 06/30/2023 0907 Last data filed at 06/30/2023 0900 Gross per 24 hour  Intake 686.61 ml  Output 4250 ml  Net -3563.39 ml      06/30/2023    5:00 AM 06/29/2023    3:48 AM 06/28/2023    4:26 AM  Last 3 Weights  Weight (lbs) 255 lb 15.3 oz 262 lb 9.1 oz 279 lb 12.2 oz  Weight (kg) 116.1 kg 119.1 kg 126.9 kg       Physical Exam   GEN: Irritated. In chair.  Cardiac: tachycardic. Regular rhythm. Warm extremities. Edema in b/l LE. Respiratory: Clear to auscultation bilaterally. Psych: Normal affect   Assessment & Plan    #Acute on chronic systolic heart failure #Cardiogenic shock Shock now resolved. NYHA III. Warm and still volume overloaded on exam. Cont diuresis with another dose of IV lasix today TEE planned for tomorrow to assess for IE  #Septic shock Ortho/IM following given wounds and ? Osteo  #DM Management per IM     Sheria Lang T. Lalla Brothers, MD, [facility], [facility] Cardiac Electrophysiology

## 2023-06-30 NOTE — H&P (View-Only) (Signed)
   Rounding Note    Patient Name: Dakota Ramirez Date of Encounter: 06/30/2023  [facility] Cardiologist: None  Subjective   NAEO. Complains of dry mouth this AM.  Vital Signs    Vitals:   06/29/23 1926 06/29/23 2357 06/30/23 0418 06/30/23 0500  BP: 122/88 (!) 128/94 119/85   Pulse: (!) 103  (!) 110   Resp: 17 20 20    Temp: 97.9 F (36.6 C) 97.8 F (36.6 C) 98 F (36.7 C)   TempSrc: Oral Oral Oral   SpO2: 98% 100% 97%   Weight:    116.1 kg  Height:        Intake/Output Summary (Last 24 hours) at 06/30/2023 0907 Last data filed at 06/30/2023 0900 Gross per 24 hour  Intake 686.61 ml  Output 4250 ml  Net -3563.39 ml      06/30/2023    5:00 AM 06/29/2023    3:48 AM 06/28/2023    4:26 AM  Last 3 Weights  Weight (lbs) 255 lb 15.3 oz 262 lb 9.1 oz 279 lb 12.2 oz  Weight (kg) 116.1 kg 119.1 kg 126.9 kg       Physical Exam   GEN: Irritated. In chair.  Cardiac: tachycardic. Regular rhythm. Warm extremities. Edema in b/l LE. Respiratory: Clear to auscultation bilaterally. Psych: Normal affect   Assessment & Plan    #Acute on chronic systolic heart failure #Cardiogenic shock Shock now resolved. NYHA III. Warm and still volume overloaded on exam. Cont diuresis with another dose of IV lasix today TEE planned for tomorrow to assess for IE  #Septic shock Ortho/IM following given wounds and ? Osteo  #DM Management per IM     Sheria Lang T. Lalla Brothers, MD, [facility], [facility] Cardiac Electrophysiology

## 2023-06-30 NOTE — Progress Notes (Cosign Needed Addendum)
Hospital day#4  Subjective:   Summary: ZYRON DELISI is a 52 yo M with a PMHx of HFrEF <20% by cardiac MRI, uncontrolled T2DM, HTN, chronic foot wounds 2/2 osteomyelitis s/p transmetatarsal amputation on the R and L decubitus ulcer who initially presented to [facility] for SOB and scrotal edema admitted to the ICU for cardiogenic shock due to osteomyelitis/cellulitis and cardiogenic shock, transitioned to IMTS care on 11/16.  His course has been complicated by a MV vegetation concerning for infective endocarditis. He is currently stable and undergoing IV diuresis and on broad IV antibiotics and ATN diuresis.   Overnight Events: None  Patient is thirsty and tired of being in the hospital. He is also hungry, likes the food and would like double portion if possible. No no lightheadedness, chest pain, shortness of breath.  No nausea or vomiting.  Patient experienced increased comfort with scrotal sling.  reports multiple wounds over his body are from wrestling his dogs at home but that these have not been draining or oozing prior to coming to the hospital.  Patient expressed he is tired of persistent treatment and returns to the hospital today.  However, Patient agreeable to staying today and undergoing procedure tomorrow morning for better visualization of the mitral valve vegetation.    Objective:  Vital signs in last 24 hours: Vitals:   06/29/23 1926 06/29/23 2357 06/30/23 0418 06/30/23 0500  BP: 122/88 (!) 128/94 119/85   Pulse: (!) 103  (!) 110   Resp: 17 20 20    Temp: 97.9 F (36.6 C) 97.8 F (36.6 C) 98 F (36.7 C)   TempSrc: Oral Oral Oral   SpO2: 98% 100% 97%   Weight:    116.1 kg  Height:       Supplemental O2: Room Air SpO2: 97 % Filed Weights   06/28/23 0426 06/29/23 0348 06/30/23 0500  Weight: 126.9 kg 119.1 kg 116.1 kg    Physical Exam:  Constitutional: Chronically ill-appearing man recumbent in bed HENT: Moist mucous membrane Neck: Not able to assess JVP  today Cardiovascular: Mild tachycardia . Without extra heart sounds auscultated  Pulmonary: On room air .speaking full sentences without increased work of breathing .clear to auscultation anteriorly.  No wheezing MSK: Increased skin wrinkling with 1+ pitting edema bilateral lower extremities.  No upper extremity edema GU: Foley catheter present.  Scrotal edema present Neurological: alert & oriented x 3 Skin: Warm and dry.  Multiple chronic wounds on forehead bilateral upper extremities with overlying scabs without drainage.  Lower extremities with hemosiderin staining and evidence of chronic venous stasis.  L lower extremity wound covered in bandage.    Intake/Output Summary (Last 24 hours) at 06/30/2023 1108 Last data filed at 06/30/2023 0900 Gross per 24 hour  Intake 686.61 ml  Output 4250 ml  Net -3563.39 ml   Net IO Since Admission: -20,005.73 mL [06/30/23 1108]  Pertinent Labs:    Latest Ref Rng & Units 06/30/2023    3:24 AM 06/29/2023    8:06 AM 06/28/2023    2:10 AM  CBC  WBC 4.0 - 10.5 K/uL 10.4  14.5  18.8   Hemoglobin 13.0 - 17.0 g/dL 84.6  96.2  95.2   Hematocrit 39.0 - 52.0 % 39.9  44.9  38.2   Platelets 150 - 400 K/uL 181  171  196        Latest Ref Rng & Units 06/30/2023    3:26 AM 06/30/2023    3:24 AM 06/29/2023    8:06  AM  CMP  Glucose 70 - 99 mg/dL 161   096   BUN 6 - 20 mg/dL 52   68   Creatinine 0.45 - 1.24 mg/dL 4.09   8.11   Sodium 914 - 145 mmol/L 132   135   Potassium 3.5 - 5.1 mmol/L 3.3   4.1   Chloride 98 - 111 mmol/L 92   93   CO2 22 - 32 mmol/L 31   27   Calcium 8.9 - 10.3 mg/dL 7.0   7.6   Total Protein 6.5 - 8.1 g/dL  7.2  8.0   Total Bilirubin <1.2 mg/dL  1.5  1.8   Alkaline Phos 38 - 126 U/L  180  227   AST 15 - 41 U/L  83  149   ALT 0 - 44 U/L  103  140     Imaging: No results found.  Assessment/Plan:   Principal Problem:   Shock (HCC) Active Problems:   Acute on chronic heart failure with reduced ejection fraction (HFrEF,  <= 40%) (HCC)   AKI (acute kidney injury) (HCC)   Hyperkalemia   Gangrene of both feet (HCC)   Sepsis with encephalopathy and septic shock (HCC)  Acute on chronic HFrEF Cardiogenic shock, improved MV mobile mass on TTE 11/14 Nonischemic cardiomyopathy.  Last Coox stable at 76%. TTE 06/27/23 with LVEF 20-25%, LV with global hypokinesis, LA severely dilated, RA mildly dilated, and mobile density on posterior mitral valve leaflet.   Has lost 3 kg overnight with an estimated 5.5 L output. Clinical improvement in renal function, LFTs, and volume status on IV diuresis.however, patient is thirsty, mildly tachycardic, and with concentrated urine today.  -S/p Furosemide 60 mg IV today; will consider decreasing dose tomorrow AM -Scrotal sling for scrotal edema, ordered -Monitor heart rate, MAP, urine output -Monitor RFP, I/Os,electrolytes; Replete if Mg <4, K <4 -N.p.o. 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 Orthopedics consulted; no surgical intervention.  Continues to be afebrile on broad-spectrum antibiotic therapy with cefepime and vancomycin. WBC 22>21.6>18.8 >14.5; today 10.4.  Micrococcus luteus on blood culture from 1112 blood collection .  Standardized susceptibility testing for that organism is not available.  This organism has been found to be a contaminant but also has been linked to infective endocarditis . Given mobile mass on mitral valve, will continue on broad-spectrum antibiotics IV  today. -Consider ID consult on 11/18 to narrow antibiotic therapy -Will collect repeat blood cultures today -Follow-up 11/12 second culture: NGT 3 days -Continue cefepime and vancomycin IV -TEE Monday 11/18; NPO at midnight for infective endocarditis evaluation  AKI Baseline 0.8 Cr. Cr 4.6 presentation. Renal ultrasound without obstruction.  Initially thought to be ATN status post ATN diuresis.  Creatinine has been able to improve with continued IV diuresis. Cr today 1.29, closer to patient's baseline.  Mild increase in bicarb at 31. -Strict I/O -Daily labs -Replete electrolytes as needed  Hypokalemia -Follow up RFP and replete as needed  Shock liver Congestive hepatopathy Continued improvement today -Follow-up LFTs until resolution -Continue diuresis  Uncontrolled DM A1c  10.3 -LA 35 units daily -Resistant SSI -Will increase regimen to accommodate for double portion of diet  Hyponatremia  Likely secondary to hypervolemia -CTM  Decubitus ulcer to bilateral LE -Ensure wound care and mattress  Diet: Heart Healthy VTE: Heparin Code: Full PT/OT recs: HH PT, no OT follow up  Dispo: home pending clinical improvement and stability.  Morene Crocker, MD Internal Medicine Resident PGY-2 Please contact the on call pager after 5 pm and on weekends at [phone].

## 2023-06-30 NOTE — Progress Notes (Signed)
Dietary contacted. Agreeable with providing patient double portion diet per MD order.

## 2023-07-01 ENCOUNTER — Other Ambulatory Visit (HOSPITAL_COMMUNITY): Payer: Self-pay

## 2023-07-01 ENCOUNTER — Inpatient Hospital Stay (HOSPITAL_COMMUNITY): Payer: 59

## 2023-07-01 ENCOUNTER — Inpatient Hospital Stay (HOSPITAL_COMMUNITY): Payer: 59 | Admitting: Certified Registered Nurse Anesthetist

## 2023-07-01 ENCOUNTER — Encounter (HOSPITAL_COMMUNITY)
Admission: EM | Discharge status: Against Medical Advice | Payer: Self-pay | Source: Home / Self Care | Attending: Internal Medicine

## 2023-07-01 ENCOUNTER — Encounter (HOSPITAL_COMMUNITY): Payer: Self-pay | Admitting: Critical Care Medicine

## 2023-07-01 DIAGNOSIS — R570 Cardiogenic shock: Secondary | ICD-10-CM | POA: Diagnosis not present

## 2023-07-01 DIAGNOSIS — I340 Nonrheumatic mitral (valve) insufficiency: Secondary | ICD-10-CM

## 2023-07-01 DIAGNOSIS — E11621 Type 2 diabetes mellitus with foot ulcer: Secondary | ICD-10-CM | POA: Diagnosis not present

## 2023-07-01 DIAGNOSIS — I428 Other cardiomyopathies: Secondary | ICD-10-CM

## 2023-07-01 DIAGNOSIS — I38 Endocarditis, valve unspecified: Secondary | ICD-10-CM

## 2023-07-01 DIAGNOSIS — L97429 Non-pressure chronic ulcer of left heel and midfoot with unspecified severity: Secondary | ICD-10-CM

## 2023-07-01 DIAGNOSIS — I5023 Acute on chronic systolic (congestive) heart failure: Secondary | ICD-10-CM | POA: Diagnosis not present

## 2023-07-01 HISTORY — PX: TRANSESOPHAGEAL ECHOCARDIOGRAM (CATH LAB): EP1270

## 2023-07-01 LAB — RENAL FUNCTION PANEL
Albumin: <1.5 g/dL — ABNORMAL LOW (ref 3.5–5.0)
Anion gap: 10 (ref 5–15)
BUN: 32 mg/dL — ABNORMAL HIGH (ref 6–20)
CO2: 30 mmol/L (ref 22–32)
Calcium: 7.3 mg/dL — ABNORMAL LOW (ref 8.9–10.3)
Chloride: 92 mmol/L — ABNORMAL LOW (ref 98–111)
Creatinine, Ser: 0.89 mg/dL (ref 0.61–1.24)
GFR, Estimated: >60 mL/min (ref >60)
Glucose, Bld: 112 mg/dL — ABNORMAL HIGH (ref 70–99)
Phosphorus: 2.1 mg/dL — ABNORMAL LOW (ref 2.5–4.6)
Potassium: 3.4 mmol/L — ABNORMAL LOW (ref 3.5–5.1)
Sodium: 132 mmol/L — ABNORMAL LOW (ref 135–145)

## 2023-07-01 LAB — HEPATIC FUNCTION PANEL
ALT: 79 U/L — ABNORMAL HIGH (ref 0–44)
AST: 57 U/L — ABNORMAL HIGH (ref 15–41)
Albumin: <1.5 g/dL — ABNORMAL LOW (ref 3.5–5.0)
Alkaline Phosphatase: 205 U/L — ABNORMAL HIGH (ref 38–126)
Bilirubin, Direct: 0.6 mg/dL — ABNORMAL HIGH (ref 0.0–0.2)
Indirect Bilirubin: 0.8 mg/dL (ref 0.3–0.9)
Total Bilirubin: 1.4 mg/dL — ABNORMAL HIGH (ref <1.2)
Total Protein: 7.8 g/dL (ref 6.5–8.1)

## 2023-07-01 LAB — CBC WITH DIFFERENTIAL/PLATELET
Abs Immature Granulocytes: 0.96 10*3/uL — ABNORMAL HIGH (ref 0.00–0.07)
Basophils Absolute: 0 10*3/uL (ref 0.0–0.1)
Basophils Relative: 0 %
Eosinophils Absolute: 0.2 10*3/uL (ref 0.0–0.5)
Eosinophils Relative: 2 %
HCT: 40.7 % (ref 39.0–52.0)
Hemoglobin: 13 g/dL (ref 13.0–17.0)
Immature Granulocytes: 8 %
Lymphocytes Relative: 11 %
Lymphs Abs: 1.3 10*3/uL (ref 0.7–4.0)
MCH: 27.7 pg (ref 26.0–34.0)
MCHC: 31.9 g/dL (ref 30.0–36.0)
MCV: 86.6 fL (ref 80.0–100.0)
Monocytes Absolute: 0.5 10*3/uL (ref 0.1–1.0)
Monocytes Relative: 5 %
Neutro Abs: 8.6 10*3/uL — ABNORMAL HIGH (ref 1.7–7.7)
Neutrophils Relative %: 74 %
Platelets: 163 10*3/uL (ref 150–400)
RBC: 4.7 MIL/uL (ref 4.22–5.81)
RDW: 15.2 % (ref 11.5–15.5)
WBC: 11.5 10*3/uL — ABNORMAL HIGH (ref 4.0–10.5)
nRBC: 0.2 % (ref 0.0–0.2)

## 2023-07-01 LAB — MAGNESIUM: Magnesium: 1.2 mg/dL — ABNORMAL LOW (ref 1.7–2.4)

## 2023-07-01 LAB — GLUCOSE, CAPILLARY: Glucose-Capillary: 109 mg/dL — ABNORMAL HIGH (ref 70–99)

## 2023-07-01 LAB — VANCOMYCIN, RANDOM: Vancomycin Rm: 21 ug/mL

## 2023-07-01 LAB — ECHO TEE: Est EF: <20

## 2023-07-01 SURGERY — TRANSESOPHAGEAL ECHOCARDIOGRAM (TEE) (CATHLAB)
Anesthesia: Monitor Anesthesia Care

## 2023-07-01 MED ORDER — SPIRONOLACTONE 12.5 MG HALF TABLET
12.5000 mg | ORAL_TABLET | Freq: Every day | ORAL | Status: DC
  Administered 2023-07-01: 12.5 mg via ORAL
  Filled 2023-07-01 (×2): qty 1

## 2023-07-01 MED ORDER — CEFADROXIL 500 MG PO CAPS
500.0000 mg | ORAL_CAPSULE | Freq: Two times a day (BID) | ORAL | 0 refills | Status: DC
  Filled 2023-07-01: qty 28, 14d supply, fill #0

## 2023-07-01 MED ORDER — CARVEDILOL 6.25 MG PO TABS
6.2500 mg | ORAL_TABLET | Freq: Two times a day (BID) | ORAL | 0 refills | Status: DC
  Filled 2023-07-01: qty 45, 23d supply, fill #0

## 2023-07-01 MED ORDER — PROPOFOL 10 MG/ML IV BOLUS
INTRAVENOUS | Status: DC | PRN
  Administered 2023-07-01: 30 mg via INTRAVENOUS
  Administered 2023-07-01: 20 mg via INTRAVENOUS

## 2023-07-01 MED ORDER — PROPOFOL 500 MG/50ML IV EMUL
INTRAVENOUS | Status: DC | PRN
  Administered 2023-07-01: 100 ug/kg/min via INTRAVENOUS

## 2023-07-01 MED ORDER — PHENYLEPHRINE HCL-NACL 20-0.9 MG/250ML-% IV SOLN
INTRAVENOUS | Status: DC | PRN
  Administered 2023-07-01: 30 ug/min via INTRAVENOUS

## 2023-07-01 MED ORDER — VANCOMYCIN HCL IN DEXTROSE 1-5 GM/200ML-% IV SOLN
1000.0000 mg | Freq: Two times a day (BID) | INTRAVENOUS | Status: DC
  Filled 2023-07-01: qty 200

## 2023-07-01 MED ORDER — TORSEMIDE 20 MG PO TABS
40.0000 mg | ORAL_TABLET | Freq: Every day | ORAL | Status: DC
  Administered 2023-07-01: 40 mg via ORAL
  Filled 2023-07-01: qty 2

## 2023-07-01 MED ORDER — LINEZOLID 600 MG PO TABS
600.0000 mg | ORAL_TABLET | Freq: Two times a day (BID) | ORAL | 0 refills | Status: DC
  Filled 2023-07-01: qty 28, 14d supply, fill #0

## 2023-07-01 MED ORDER — TORSEMIDE 20 MG PO TABS
60.0000 mg | ORAL_TABLET | Freq: Every day | ORAL | 0 refills | Status: DC
  Filled 2023-07-01: qty 90, 30d supply, fill #0

## 2023-07-01 MED ORDER — POTASSIUM CHLORIDE CRYS ER 20 MEQ PO TBCR
40.0000 meq | EXTENDED_RELEASE_TABLET | Freq: Every day | ORAL | 2 refills | Status: DC
  Filled 2023-07-01: qty 60, 30d supply, fill #0

## 2023-07-01 MED ORDER — SPIRONOLACTONE 25 MG PO TABS
12.5000 mg | ORAL_TABLET | Freq: Every day | ORAL | 0 refills | Status: DC
  Filled 2023-07-01: qty 15, 30d supply, fill #0

## 2023-07-01 MED ORDER — TORSEMIDE 20 MG PO TABS: 60.0000 mg | ORAL_TABLET | Freq: Every day | ORAL | Status: DC

## 2023-07-01 MED ORDER — SODIUM CHLORIDE 0.9 % IV SOLN: 2.0000 g | INTRAVENOUS | Status: DC

## 2023-07-01 MED ORDER — SODIUM CHLORIDE 0.9 % IV SOLN: INTRAVENOUS | Status: DC

## 2023-07-01 MED ORDER — NOVOLOG MIX 70/30 FLEXPEN (70-30) 100 UNIT/ML ~~LOC~~ SUPN
30.0000 [IU] | PEN_INJECTOR | Freq: Two times a day (BID) | SUBCUTANEOUS | 3 refills | Status: DC
  Filled 2023-07-01: qty 15, 25d supply, fill #0

## 2023-07-01 MED ORDER — SODIUM CHLORIDE 0.9 % IV SOLN
8.0000 mg/kg | Freq: Every day | INTRAVENOUS | Status: DC
  Filled 2023-07-01: qty 16

## 2023-07-01 MED ORDER — MAGNESIUM SULFATE 2 GM/50ML IV SOLN
2.0000 g | Freq: Once | INTRAVENOUS | Status: AC
  Administered 2023-07-01: 2 g via INTRAVENOUS
  Filled 2023-07-01: qty 50

## 2023-07-01 NOTE — Discharge Summary (Signed)
Name: Dakota Ramirez MRN: [id number] DOB: 1970/09/07 52 y.o. PCP: Annett Fabian, MD  Date of Admission: 06/25/2023 10:12 PM Date of Discharge: 07/01/2023 Attending Physician: No att. providers found   Patient was discharged Against Medical Advice  Discharge Diagnosis: 1. Acute on chronic HFrEF, s/p cardiogenic shock 2. Sepsis, s/p septic shock 2/2 osteomyelitis   Discharge Medications: Allergies as of 07/01/2023   No Known Allergies      Medication List     STOP taking these medications    digoxin 0.125 MG tablet Commonly known as: LANOXIN   Entresto 97-103 MG Generic drug: sacubitril-valsartan   furosemide 40 MG tablet Commonly known as: LASIX   ibuprofen 800 MG tablet Commonly known as: ADVIL       TAKE these medications    carvedilol 6.25 MG tablet Commonly known as: COREG Take 1 tablet (6.25 mg total) by mouth 2 (two) times daily.   cefadroxil 500 MG capsule Commonly known as: DURICEF Take 1 capsule (500 mg total) by mouth 2 (two) times daily.   linezolid 600 MG tablet Commonly known as: ZYVOX Take 1 tablet (600 mg total) by mouth 2 (two) times daily.   NovoLOG Mix 70/30 FlexPen (70-30) 100 UNIT/ML FlexPen Generic drug: insulin aspart protamine - aspart Inject 30 Units into the skin 2 (two) times daily with a meal. What changed: See the new instructions.   potassium chloride SA 20 MEQ tablet Commonly known as: KLOR-CON M Take 2 tablets (40 mEq total) by mouth daily.   spironolactone 25 MG tablet Commonly known as: ALDACTONE Take 0.5 tablets (12.5 mg total) by mouth daily. Start taking on: July 02, 2023 What changed: how much to take   torsemide 20 MG tablet Commonly known as: DEMADEX Take 3 tablets (60 mg total) by mouth daily. Start taking on: July 02, 2023        Disposition and follow-up:   Dakota Ramirez was discharged from [facility] in Stable condition.  At the hospital follow up visit please  address:  1.  HFrEF: check if patient has been compliant with GDMT for HFrEF, and if he is currently well-compensated. His Entresto and digoxin were discontinued per heart failure team while admitted, can likely be restarted at outpatient cardiology follow up.   2. Sepsis: check patient's feet for signs of further infection or deterioration. Check vitals  2.  Labs / imaging needed at time of follow-up: None  3.  Pending labs/ test needing follow-up: TEE results  Follow-up Appointments:  Follow-up Information     Annett Fabian, MD. Go in 17 day(s).   Specialty: Internal Medicine Why: Hospital follow up appointment scheduled for Monday, July 22, 2023 at 3:45 PM. PLEASE ARRIVE 10-15 minutes early.  PLEASE call and cancel/reschedule if you CANNOT make appoinment. Contact information: [address] [place] [address] [phone]         [facility] Follow up on 07/09/2023.   Specialty: Cardiology Why: Follow up in AHF clinic 11/26 at 12 pm Contact information: [address] [place] [address] [phone]                Hospital Course by problem list:  Acute on chronic HFrEF Cardiogenic shock, improved MV mobile mass on TTE Severe hypoalbuminemia Patient presented to the hospital with complaints of dyspnea and scrotal edema in setting of medication nonadherence, found to be in cardiogenic shock and was admitted to the ICU. Initial  COOX during this admission at 53%  s/p norepinephrine. Last Coox stable at 76%. TTE 06/27/23 with LVEF 20-25%, LV with global hypokinesis, LA severely dilated, RA mildly dilated, and mobile density on posterior mitral valve leaflet.   Received high doses of IV diuresis this admission. GDMT was held in setting on ongoing diuresis and low normal blood pressures. Scrotal edema managed with scrotal sling. Per cardiology, this patient is not a candidate for advance therapies due  to non adherence and chronic LE wounds. TEE was performed on 11/18, patient left AMA before final result could be read. At time of AMA discharge patient appeared euvolemic and well-compensated.   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 Orthopedics consulted; no surgical intervention. He was afebrile while treated with vancomycin and cefepime during his inpatient stay. WBC 22>21.6>18.8> 14.5.>10.4>11.6.  Micrococcus luteus on blood culture from 11/12 blood collection. This organism has been found to be a contaminant but also has been linked to infective endocarditis. TEE performed on 11/18 did not demonstrate mobile mass, which ID suspects may have blown off the valve. Patient was given two week course of linezolid and cefadroxil at time of AMA discharge.   AKI ATN diuresis, resolved Cardiorenal syndrome, improving Baseline creatinine is 0.8, Cr was 4.6 at time of presentation. Renal ultrasound without obstruction. UA with 1+ protein and small blood. Etiology of AKI thought to be secondary to ATN with massive ATN diuresis, resolved at time of [facility] discharge.  Shock liver Congestive hepatopathy Initial elevation of liver enzymes, which trended down as BP stabilized   Hypokalemia Hypomagnesemia Electrolytes were repleted as needed   Uncontrolled DM A1c 10.3. Managed with semglee 35 units and resistant SSI insulin. Patient discharged on home dose of 25 units 70/30 BID   Hyponatremia  Likely secondary to hypervolemia, improving 122>126>124>129>135  No neuro symptoms   Discharge Exam:   BP 105/67   Pulse (!) 106   Temp 98.1 F (36.7 C) (Temporal)   Resp 14   Ht 6\' 3"  (1.905 m)   Wt 117 kg   SpO2 97%   BMI 32.24 kg/m   Subjective: Paged  in the afternoon that patient wished to leave AMA. I had a long conversation at bedside about the severe risks of leaving before completing antibiotics course and before his TEE could be formally read, including severe septic shock and death. The warning was repeated by another resident and by the infectious disease attending doctor. In spite of these warnings, the patient was adamant about leaving the same day. He verbalized understanding of the risks and acknowledged the possibilities of devastating outcomes. No amount of persuasion or bargaining could convince him to stay. Patient was able to remain in the hospital long enough to have the [facility] team bring oral antibiotics to the bedside.    Discharge Instructions:  Dakota Ramirez,   It is imperative that you take Linezolid and Cefadroxil antibiotics twice per day for two weeks as prescribed. Please also continue to take the medications for your heart failure as outlined above. Please keep your appointment at our internal medicine clinic on Dec 9 at 3:45. You also have an appointment at [facility] on 11/26 at 12pm. I have also messaged your orthopedic doctor to arrange follow up  as well.  Please note these changes made to your medications:   *Please START taking:  Cefadroxil 500mg  twice per day for two weeks Linezolid 600mg  twice per day for two weeks Torsemide 20mg  three times per day  *Please STOP taking:  Digoxin Entresto Furosemide  Please make sure to return to the hospital if you again have the symptoms that brought you to the hospital such as severe shortness of breath, scrotal swelling, fever, or severe pain.   Please call our clinic if you have any questions or concerns, we may be able to help and keep you from a long and expensive emergency room wait. Our clinic and after hours phone number is [phone], the best time to call is Monday through Friday 9 am to 4 pm but there is always someone available 24/7 if  you have an emergency. If you need medication refills please notify your pharmacy one week in advance and they will send [place] a request.    Signed: Monna Fam, MD 07/01/2023, 5:21 PM

## 2023-07-01 NOTE — Progress Notes (Addendum)
Advanced Heart Failure Rounding Note  PCP-Cardiologist: None   Subjective:   Admitted w/ mixed septic and cardiogenic shock.  Echo 06/27/23 EF 20-25%, LV with GHK, RV mildly reduced, LA sev dilated, RA mildly dilated, mobile density on posterior mitral valve leaflet  Remains on abx. Afebrile, WBC now downtrending, 22>19>11.5K. BCx NGTD   Diuresed great with IV lasix. Overall down at least 23 lbs (weights questionable). -6+L UOP yesterday.   NPO for TEE today  Very upset and cranky about being NPO. Wants to go home after TEE.   Objective:   Weight Range: 117 kg Body mass index is 32.24 kg/m.   Vital Signs:   Temp:  [97.1 F (36.2 C)-99 F (37.2 C)] 98.5 F (36.9 C) (11/18 0404) Pulse Rate:  [111-114] 111 (11/18 0404) Resp:  [15-18] 16 (11/18 0404) BP: (98-129)/(63-97) 99/72 (11/18 0404) SpO2:  [98 %-100 %] 100 % (11/18 0404) Weight:  [117 kg] 117 kg (11/18 0505) Last BM Date : 06/29/23  Weight change: Filed Weights   06/29/23 0348 06/30/23 0500 07/01/23 0505  Weight: 119.1 kg 116.1 kg 117 kg    Intake/Output:   Intake/Output Summary (Last 24 hours) at 07/01/2023 0904 Last data filed at 07/01/2023 0406 Gross per 24 hour  Intake 460 ml  Output 6150 ml  Net -5690 ml     Physical Exam   General:  upset appearing.  No respiratory difficulty HEENT: normal Neck: supple. JVD ~8 cm. Carotids 2+ bilat; no bruits. No lymphadenopathy or thyromegaly appreciated. Cor: PMI nondisplaced. Regular rate & rhythm. No rubs, gallops or murmurs. Lungs: clear Abdomen: soft, nontender, nondistended. No hepatosplenomegaly. No bruits or masses. Good bowel sounds. Extremities: no cyanosis, clubbing, rash, edema  Neuro: alert & oriented x 3, cranial nerves grossly intact. moves all 4 extremities w/o difficulty. Affect upset.   Telemetry   ST 100s (Personally reviewed)    EKG    N/A   Labs    CBC Recent Labs    06/30/23 0324 07/01/23 0430  WBC 10.4 11.5*   NEUTROABS  --  8.6*  HGB 12.7* 13.0  HCT 39.9 40.7  MCV 84.5 86.6  PLT 181 163   Basic Metabolic Panel Recent Labs    40/98/11 0324 06/30/23 0326 07/01/23 0430  NA  --  132* 132*  K  --  3.3* 3.4*  CL  --  92* 92*  CO2  --  31 30  GLUCOSE  --  190* 112*  BUN  --  52* 32*  CREATININE  --  1.29* 0.89  CALCIUM  --  7.0* 7.3*  MG 1.0*  --  1.2*  PHOS  --  3.2 2.1*   Liver Function Tests Recent Labs    06/30/23 0324 06/30/23 0326 07/01/23 0430  AST 83*  --  57*  ALT 103*  --  79*  ALKPHOS 180*  --  205*  BILITOT 1.5*  --  1.4*  PROT 7.2  --  7.8  ALBUMIN <1.5* <1.5* <1.5*  <1.5*   No results for input(s): "LIPASE", "AMYLASE" in the last 72 hours. Cardiac Enzymes No results for input(s): "CKTOTAL", "CKMB", "CKMBINDEX", "TROPONINI" in the last 72 hours.  BNP: BNP (last 3 results) Recent Labs    02/25/23 1418 04/22/23 1420 06/25/23 2300  BNP 1,019.9* 839.3* 1,991.5*    ProBNP (last 3 results) No results for input(s): "PROBNP" in the last 8760 hours.   D-Dimer No results for input(s): "DDIMER" in the last 72 hours. Hemoglobin A1C No  results for input(s): "HGBA1C" in the last 72 hours.  Fasting Lipid Panel No results for input(s): "CHOL", "HDL", "LDLCALC", "TRIG", "CHOLHDL", "LDLDIRECT" in the last 72 hours. Thyroid Function Tests No results for input(s): "TSH", "T4TOTAL", "T3FREE", "THYROIDAB" in the last 72 hours.  Invalid input(s): "FREET3"  Other results:   Imaging    No results found.   Medications:   Scheduled Medications:  Chlorhexidine Gluconate Cloth  6 each Topical Q0600   heparin  5,000 Units Subcutaneous Q8H   insulin aspart  0-20 Units Subcutaneous TID WC   insulin aspart  0-5 Units Subcutaneous QHS   insulin glargine-yfgn  35 Units Subcutaneous QHS   lidocaine  2 patch Transdermal Q24H   mupirocin ointment   Nasal BID   vancomycin variable dose per unstable renal function (pharmacist dosing)   Does not apply See admin  instructions    Infusions:  sodium chloride     ceFEPime (MAXIPIME) IV 2 g (07/01/23 0037)   magnesium sulfate bolus IVPB      PRN Medications: acetaminophen, HYDROmorphone   Patient Profile   52 y/o male w/ chronic systolic heart failure w/ severe biventricular dysfunction due to NICM, uncontrolled T2DM, chronic LE wounds/ chronic osteomyelitis s/p left toe amputation, rt TMA and poor compliance, now admitted w/ acute on chronic systolic heart failure and mixed septic and cardiogenic shock in setting of recurrent foot infection.   Assessment/Plan  1. Acute on Chronic Systolic Heart Failure w/ Cardiogenic Shock   - NICM. Reports hx CHF in his mother. No hx alcohol or drug abuse. 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 RVEF 22% LGE imaging very poor quality, which limits interpretation. Appears to have RV insertion site LGE (associated with worse prognosis) - Echo (5/24): EF < 20%, LV with GHK, RV mildly reduced, LA/RA sev dilated, mild-mod MR, mild TR, trivial pericardial effusion.  - Echo 06/27/23 EF 20-25%, LV with GHK, RV mildly reduced, LA sev dilated, RA mildly dilated, mobile density on posterior mitral valve leaflet - Now admitted w/ marked volume overload and CGS, c/b septic shock. Initial co-ox 53%. Placed on NE. NE weaned off.  - LA cleared 2.6>1.7  - c/w marked volume overload. Diuresed at least 23 lbs, weights questionable. Start Torsemide 40 mg daily - Restart Spiro 12.5 mg daily - not candidate for advanced therapies given noncompliance and commodities including chronic LE wounds and uncontrolled DM    2. Septic Shock  - source = foot wound/ osteomyelitis - on vanc + cefepime - BCx  NGTD - ? Obtaining wound cx to identify organism to guide tx  - Ortho following  - LA cleared   3. Chronic Foots Wounds/ Osteomyelitis w/ Overlying Cellulitis  - management per ortho and WOC  - abx per above    4. AKI - 2/2 mixed shock/hypotension  - SCr peaked  4.88 (b/l 0.8) - SCr back to baseline 0.89 today - nephrology signed off    5. Shock Liver/ Hepatic Congestion  - AST 384>>318>57 - ALT 188>>189>79 - Tbili 1.8>>1.5>1.4 - continue diuresis per above - follow trends    6. Poorly Controlled T2DM w/ Complications - Hgb A1c 10.3  - insulin per CCM    7. Poor Med Compliance -  THN. Qualifies for HF paramedicine but has previously refused   8. Hypokalemia - 2/2 diuresis? - K 3.4, replete   9. Mitral valve  - new mobile density on posterior mitral valve leaflet, seen on echo 06/27/23 - TEE today  Wants to go home after TEE. Upset about being here.   Length of Stay: 5  Alen Bleacher, NP  07/01/2023, 9:04 AM  Advanced Heart Failure Team Pager [phone] (M-F; 7a - 5p)  Please contact [facility] for night-coverage after hours (5p -7a ) and weekends on [email or web address]

## 2023-07-01 NOTE — Interval H&P Note (Signed)
History and Physical Interval Note:  07/01/2023 2:50 PM  Dakota Ramirez  has presented today for surgery, with the diagnosis of ENDOCARDITIS.  The various methods of treatment have been discussed with the patient and family. After consideration of risks, benefits and other options for treatment, the patient has consented to  Procedure(s): TRANSESOPHAGEAL ECHOCARDIOGRAM (N/A) as a surgical intervention.  The patient's history has been reviewed, patient examined, no change in status, stable for surgery.  I have reviewed the patient's chart and labs.  Questions were answered to the patient's satisfaction.     Romie Minus

## 2023-07-01 NOTE — Progress Notes (Signed)
Pharmacy Antibiotic Note  Dakota Ramirez is a 52 y.o. male admitted on 06/25/2023 with TTE on 11/14 showing a mobile echodensity on the mitral valve, likely endocarditis. This will be confirmed with a TEE on Monday  Pharmacy has been consulted for vancomycin  dosing.  Plan: Vancomycin 1000 mg  IV every 12 hours.  Goal trough 15-20 mcg/mL. Discussed with Celedonio Miyamoto, PharmD, Antimicrobial Stewardship Coordinator, who--after review of all data recommends the 1000 mg vancomycin dose every 12 hours, to commence at 1800 tonight.   Height: 6\' 3"  (190.5 cm) Weight: 117 kg (257 lb 15 oz) IBW/kg (Calculated) : 84.5  Temp (24hrs), Avg:98.2 F (36.8 C), Min:97.1 F (36.2 C), Max:99 F (37.2 C)  Recent Labs  Lab 06/25/23 2313 06/26/23 0105 06/26/23 0300 06/26/23 0421 06/26/23 0825 06/27/23 0209 06/28/23 0210 06/28/23 1405 06/29/23 0806 06/29/23 1047 06/30/23 0324 06/30/23 0326 06/30/23 1233 07/01/23 0430  WBC  --   --    < >  --   --  21.6* 18.8*  --  14.5*  --  10.4  --   --  11.5*  CREATININE  --   --    < >  --   --  4.84* 3.51* 3.01* 2.12*  --   --  1.29*  --  0.89  LATICACIDVEN 2.6* 2.5*  --  2.0* 1.7  --   --   --   --   --   --   --   --   --   VANCORANDOM  --   --   --   --   --   --  12  --   --    < >  --   --  13 21   < > = values in this interval not displayed.    Estimated Creatinine Clearance: 135.4 mL/min (by C-G formula based on SCr of 0.89 mg/dL).    No Known Allergies  Antimicrobials this admission: Zosyn 11/12 x1 Cefepime 11/13 >>  Metronidazole 11/13 >> 11/15 Vancomycin 11/13 >>   Dose adjustments this admission: 11/15 - increased cefepime 2g q24h > 2g q12h for improving renal function 11/15 - redosed vancomycin 1500mg  IV x1 for vancomycin level = 65mcg/ml 11/16 - redosed vancomycin 1750mg  IV x1 for vanc level 13 mcg/mL 11/17 - redosed vancomycin 2000mg  IV x1 for vanc level 13 mcg/mL 11/17 - increased cefepime to 2g q8h for improving renal  function  Microbiology results: 11/12 Bcx x2: 1/3 gram positive cocci- micrococcus luteus 11/13 MRSA PCR: detected Thank you for allowing pharmacy to be a part of this patient's care.  Elicia Lamp, PharmD, CPP 07/01/2023 10:06 AM

## 2023-07-01 NOTE — Progress Notes (Signed)
PT Cancellation Note  Patient Details Name: DARIAN PEPIN MRN: [id number] DOB: 19-Dec-1970   Cancelled Treatment:    Reason Eval/Treat Not Completed: Pain limiting ability to participate (Per RN pt is in too much pain too participate in PT right now. Will follow up tomorrow.)   Gladys Damme 07/01/2023, 2:51 PM

## 2023-07-01 NOTE — Inpatient Diabetes Management (Signed)
Inpatient Diabetes Program Recommendations  AACE/ADA: New Consensus Statement on Inpatient Glycemic Control (2015)  Target Ranges:  Prepandial:   less than 140 mg/dL      Peak postprandial:   less than 180 mg/dL (1-2 hours)      Critically ill patients:  140 - 180 mg/dL   Lab Results  Component Value Date   GLUCAP 109 (H) 07/01/2023   HGBA1C 10.3 (H) 06/26/2023    Review of Glycemic Control  Diabetes history: DM 2 Outpatient Diabetes medications: 70/30 30 units bid Current orders for Inpatient glycemic control:  Semglee 35 units qhs Novolog 0-20 units tid + hs  A1c 10.3% down from 11.2% in 12/2022  Spoke with pt at bedside regarding A1c level of 10.3% and glucose control at home. Pt reports not checking his glucose and taking medications regularly. Discussed importance of glucose control at home to reduce complication from hyperglycemia. Pt reports needing refills on his medications at time of discharge.  Thanks,  Christena Deem RN, MSN, BC-ADM Inpatient Diabetes Coordinator Team Pager [phone] (8a-5p)

## 2023-07-01 NOTE — Progress Notes (Signed)
Patient left AMA . Patient educated. MD notified and aware. TOC meds in hands.

## 2023-07-01 NOTE — Anesthesia Postprocedure Evaluation (Signed)
Anesthesia Post Note  Patient: FAIZAAN MABRA  Procedure(s) Performed: TRANSESOPHAGEAL ECHOCARDIOGRAM     Patient location during evaluation: PACU Anesthesia Type: MAC Level of consciousness: awake and alert Pain management: pain level controlled Vital Signs Assessment: post-procedure vital signs reviewed and stable Respiratory status: spontaneous breathing, nonlabored ventilation, respiratory function stable and patient connected to nasal cannula oxygen Cardiovascular status: stable and blood pressure returned to baseline Postop Assessment: no apparent nausea or vomiting Anesthetic complications: no  No notable events documented.  Last Vitals:  Vitals:   07/01/23 1323 07/01/23 1325  BP:  105/67  Pulse: (!) 104 (!) 106  Resp: (!) 24 14  Temp: 36.7 C   SpO2: 91% 97%    Last Pain:  Vitals:   07/01/23 1323  TempSrc: Temporal  PainSc: 0-No pain                 Kennieth Rad

## 2023-07-01 NOTE — Progress Notes (Signed)
PT Cancellation Note  Patient Details Name: Dakota Ramirez MRN: [id number] DOB: [birth year]   Cancelled Treatment:    Reason Eval/Treat Not Completed: Patient at procedure or test/unavailable (Pt in TEE.Will follow up at later date.)   Bevelyn Buckles 07/01/2023, 12:28 PM Danon Lograsso M,PT Acute Rehab Services [phone]

## 2023-07-01 NOTE — Progress Notes (Signed)
Pt refused blood glucose test this morning. Unable to administer sliding scale insulin without this test. Will make day shift nurse aware.

## 2023-07-01 NOTE — TOC Progression Note (Signed)
Transition of Care [facility]) - Progression Note    Patient Details  Name: Dakota Ramirez MRN: [id number] Date of Birth: June 15, 1971  Transition of Care [facility]) CM/SW Contact  Nicanor Bake Phone Number: [phone] 07/01/2023, 2:59 PM  Clinical Narrative:   HF CSW met with pt at bedside. CSW informed the pt that she was able to schedule the pt a hospital follow up appointment. Pt mentioned several times that he was ready to go home. CSW explained that we are waiting for him to be medically ready for dc.   TOC will continue following.     Expected Discharge Plan: Home w Home Health Services Barriers to Discharge: Continued Medical Work up  Expected Discharge Plan and Services       Living arrangements for the past 2 months: Mobile Home                                       Social Determinants of Health (SDOH) Interventions SDOH Screenings   Food Insecurity: Food Insecurity Present (12/28/2022)  Housing: Patient Declined (12/28/2022)  Transportation Needs: No Transportation Needs (11/29/2021)  Utilities: Not At Risk (12/28/2022)  Depression (PHQ2-9): Low Risk  (01/17/2023)  Recent Concern: Depression (PHQ2-9) - Medium Risk (01/10/2023)  Financial Resource Strain: High Risk (01/10/2023)  Social Connections: Unknown (12/15/2021)   Received from [facility]  Stress: No Stress Concern Present (02/28/2021)   Received from [facility]  Tobacco Use: Low Risk  (06/25/2023)    Readmission Risk Interventions     No data to display

## 2023-07-01 NOTE — Transfer of Care (Signed)
Immediate Anesthesia Transfer of Care Note  Patient: Dakota Ramirez  Procedure(s) Performed: TRANSESOPHAGEAL ECHOCARDIOGRAM  Patient Location: Cath Lab  Anesthesia Type:MAC  Level of Consciousness: drowsy and patient cooperative  Airway & Oxygen Therapy: Patient Spontanous Breathing  Post-op Assessment: Report given to RN, Post -op Vital signs reviewed and stable, and Patient moving all extremities X 4  Post vital signs: Reviewed and stable  Last Vitals:  Vitals Value Taken Time  BP 97/60   Temp    Pulse 107   Resp 14   SpO2 98     Last Pain:  Vitals:   07/01/23 1112  TempSrc: Temporal  PainSc: 0-No pain         Complications: No notable events documented.

## 2023-07-01 NOTE — Anesthesia Preprocedure Evaluation (Signed)
Anesthesia Evaluation    Airway Mallampati: II  TM Distance: >3 FB Neck ROM: Full    Dental no notable dental hx.    Pulmonary    Pulmonary exam normal breath sounds clear to auscultation       Cardiovascular hypertension, + Peripheral Vascular Disease and +CHF  Normal cardiovascular exam Rhythm:Regular Rate:Normal   Echo (4/23): EF 20-25%, regional WMA, moderately dilated LV, RV moderately reduced, RVSP 40 mmHg, mild to moderate MR, dilated IVC with estimated RAP 15 mmHg - R/LHC (4/23): showed normal cors, severe NICM EF 20-25% and mildly elevated filling pressures w/ normal CO - cMRI (4/24): showed severe biventricular failure, LVEF 17%. RVEF 22% LGE imaging very poor quality, which limits interpretation. Appears to have RV insertion site LGE (associated with worse prognosis    Neuro/Psych    GI/Hepatic   Endo/Other  diabetes, Poorly Controlled, Insulin Dependent    Renal/GU Renal InsufficiencyRenal disease     Musculoskeletal   Abdominal   Peds  Hematology   Anesthesia Other Findings   Reproductive/Obstetrics                             Anesthesia Physical Anesthesia Plan  ASA: 4  Anesthesia Plan: MAC   Post-op Pain Management:    Induction: Intravenous  PONV Risk Score and Plan: 1 and Propofol infusion and Treatment may vary due to age or medical condition  Airway Management Planned: Simple Face Mask and Nasal Cannula  Additional Equipment:   Intra-op Plan:   Post-operative Plan:   Informed Consent: I have reviewed the patients History and Physical, chart, labs and discussed the procedure including the risks, benefits and alternatives for the proposed anesthesia with the patient or authorized representative who has indicated his/her understanding and acceptance.     Dental advisory given  Plan Discussed with: CRNA and Surgeon  Anesthesia Plan Comments:         Anesthesia Quick Evaluation

## 2023-07-01 NOTE — TOC Progression Note (Signed)
Transition of Care [facility]) - Progression Note    Patient Details  Name: Dakota Ramirez MRN: [id number] Date of Birth: 05-01-1971  Transition of Care [facility]) CM/SW Contact  Elliot Cousin, RN Phone Number: [phone] 07/01/2023, 5:12 PM  Clinical Narrative:    CM spoke to pt and states he was leaving, he is tired of being in bed.  Patient dc AMA.     Expected Discharge Plan: Home w Home Health Services Barriers to Discharge: No Barriers Identified  Expected Discharge Plan and Services       Living arrangements for the past 2 months: Mobile Home                                       Social Determinants of Health (SDOH) Interventions SDOH Screenings   Food Insecurity: Food Insecurity Present (12/28/2022)  Housing: Patient Declined (12/28/2022)  Transportation Needs: No Transportation Needs (11/29/2021)  Utilities: Not At Risk (12/28/2022)  Depression (PHQ2-9): Low Risk  (01/17/2023)  Recent Concern: Depression (PHQ2-9) - Medium Risk (01/10/2023)  Financial Resource Strain: High Risk (01/10/2023)  Social Connections: Unknown (12/15/2021)   Received from [facility], [facility]  Stress: No Stress Concern Present (02/28/2021)   Received from [facility], [facility]  Tobacco Use: Low Risk  (06/25/2023)    Readmission Risk Interventions     No data to display

## 2023-07-01 NOTE — Plan of Care (Signed)

## 2023-07-01 NOTE — Procedures (Signed)
   TRANSESOPHAGEAL ECHOCARDIOGRAM  NAME:  Dakota Ramirez    MRN: [id number] DOB:  11/04/1970    ADMIT DATE: 06/25/2023  INDICATIONS: Mitral valve potential mass  PROCEDURE:   Informed consent was obtained prior to the procedure. The risks, benefits and alternatives for the procedure were discussed and the patient comprehended these risks.  Risks include, but are not limited to, cough, sore throat, vomiting, nausea, somnolence, esophageal and stomach trauma or perforation, bleeding, low blood pressure, aspiration, pneumonia, infection, trauma to the teeth and death.    After a procedural time-out, the oropharynx was anesthetized and the patient was sedated by the anesthesia service. The transesophageal probe was inserted in the esophagus and stomach without difficulty and multiple views were obtained. Sedation by anesthesia.   COMPLICATIONS:    Complications: No complications.  The patient had normal neuro status and respiratory status post procedure with vitals stable as recorded elsewhere.  Adequate airway was maintained throughout and vital signs monitored per protocol.  KEY FINDINGS:  Severely reduced LV function, mildly reduced RV function. Moderate MR. No evidence of valvular vegetation, mitral valve mass not seen.  Full Report to follow.   Clearnce Hasten Advanced Heart Failure 6:13 PM

## 2023-07-02 ENCOUNTER — Encounter (HOSPITAL_COMMUNITY): Payer: Self-pay | Admitting: Cardiology

## 2023-07-02 ENCOUNTER — Telehealth: Payer: Self-pay | Admitting: *Deleted

## 2023-07-02 NOTE — Transitions of Care (Post Inpatient/ED Visit) (Signed)
   07/02/2023  Name: Dakota Ramirez MRN: [id number] DOB: 1971-04-03  Today's TOC FU Call Status: Today's TOC FU Call Status:: Unsuccessful Call (1st Attempt) Unsuccessful Call (1st Attempt) Date: 07/02/23  Attempted to reach the patient regarding the most recent Inpatient/ED visit.  Follow Up Plan: Additional outreach attempts will be made to reach the patient to complete the Transitions of Care (Post Inpatient/ED visit) call.   Irving Shows [facility], BSN RN Care Manager/ Transition of Care [place]/ [facility] [phone]

## 2023-07-02 NOTE — Consult Note (Signed)
[facility]    Date of Admission:  06/25/2023   Total days of inpatient antibiotics 6        Reason for Consult: MV veg    Active Problems:   Type 2 diabetes mellitus with foot ulcer, with long-term current use of insulin (HCC)   PVD (peripheral vascular disease) (HCC)   Diabetic osteomyelitis (HCC)   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 to breakdown of skin (HCC)   Acute on chronic heart failure with reduced ejection fraction (HFrEF, <= 40%) (HCC)   AKI (acute kidney injury) (HCC)   Hyperkalemia   Congestive hepatopathy   Bacteremia   Mitral valve vegetation   Hypoalbuminemia due to protein-calorie malnutrition (HCC)   Non-adherence to medical treatment   Non-ischemic cardiomyopathy [facility])   Assessment: 52 year old male with history of osteomyelitis requiring right TMA, left heel ulcer, diabetes mellitus, polysubstance abuse admitted with shortness of breath #Concern for consult negative endocarditis with possible emboli to back - Blood cultures from admission grew 1/4 Micrococcus luteus.  I suspect this is just contamination. - TTE showed mitral valve vegetation.  TEE without vegetation.  He does report mid to lower back pain.  Concern for septic emboli.  I discussed that he will likely need to be treated for culture-negative endocarditis.  He is back in imaging for further evaluation.  He would not let me fully examine him  Recommendations:  -Declined MRI T/L spine -Ceftriaxone and vancomycin -Pt states he will eave as soon as his ride arrives.I urged him to stay for further treatment and abx management -Polysubstance abuse(UDS+amphetamines)->not a home PICC line candidate -Needs possible further imaging of his feet.  Microbiology:   Antibiotics: Vanc 11/11- Cefepime 11/11- Piptaxo 11/12  Cultures: Blood 11/12 1/4 bottles micrococcus luteus 11/17 Urine  Other   HPI: Dakota Ramirez is a 52 y.o. male with  history of heart failure reduced ejection fraction, diabetes mellitus, hypertension, chronic foot wounds complicated by osteomyelitis status post TMA on the right, healers on the left presented with shortness of breath and scrotal edema admitted for shock secondary unclear etiology.  TTE showed mitral valve is thickened.  Mobile density on posterior mitral leaflet measuring 1.59X 0.61 cm.  Blood cultures grew 1 out of 4 Micrococcus luteus.  CT chest showed bronchial wall thickening with mucous plugging, nodular hepatic contour suggesting early cirrhosis, bilateral inguinal lymphadenopathy.  Infectious disease engaged for antibiotic commendations.   Review of Systems: Review of Systems  All other systems reviewed and are negative.   Past Medical History:  Diagnosis Date   CHF (congestive heart failure) (HCC)    Diabetes mellitus without complication (HCC)    Type II   Heart failure with reduced ejection fraction (HCC) 11/26/2021   Hypertension    Sepsis with encephalopathy and septic shock (HCC) 06/26/2023   Shock (HCC) 06/26/2023    Social History   Tobacco Use   Smoking status: Never   Smokeless tobacco: Never  Vaping Use   Vaping status: Never Used  Substance Use Topics   Alcohol use: Not Currently    Comment: rare   Drug use: Never    Family History  Problem Relation Age of Onset   Diabetes Mother    Hypertension Mother    Scheduled Meds: Continuous Infusions: PRN Meds:. No Known Allergies  OBJECTIVE: Blood pressure 105/67, pulse (!) 106, temperature 98.1 F (36.7 C), temperature source Temporal, resp. rate 14, height 6'  3" (1.905 m), weight 117 kg, SpO2 97%.  Physical Exam Constitutional:      General: He is not in acute distress.    Appearance: He is normal weight. He is not toxic-appearing.  HENT:     Head: Normocephalic and atraumatic.     Right Ear: External ear normal.     Left Ear: External ear normal.     Nose: No congestion or rhinorrhea.      Mouth/Throat:     Mouth: Mucous membranes are moist.     Pharynx: Oropharynx is clear.  Eyes:     Extraocular Movements: Extraocular movements intact.     Conjunctiva/sclera: Conjunctivae normal.     Pupils: Pupils are equal, round, and reactive to light.  Cardiovascular:     Rate and Rhythm: Normal rate and regular rhythm.     Heart sounds: No murmur heard.    No friction rub. No gallop.  Pulmonary:     Effort: Pulmonary effort is normal.     Breath sounds: Normal breath sounds.  Abdominal:     General: Abdomen is flat. Bowel sounds are normal.     Palpations: Abdomen is soft.  Musculoskeletal:        General: No swelling.     Cervical back: Normal range of motion and neck supple.  Skin:    General: Skin is warm and dry.  Neurological:     General: No focal deficit present.     Mental Status: He is oriented to person, place, and time.  Psychiatric:        Mood and Affect: Mood normal.     Lab Results Lab Results  Component Value Date   WBC 11.5 (H) 07/01/2023   HGB 13.0 07/01/2023   HCT 40.7 07/01/2023   MCV 86.6 07/01/2023   PLT 163 07/01/2023    Lab Results  Component Value Date   CREATININE 0.89 07/01/2023   BUN 32 (H) 07/01/2023   NA 132 (L) 07/01/2023   K 3.4 (L) 07/01/2023   CL 92 (L) 07/01/2023   CO2 30 07/01/2023    Lab Results  Component Value Date   ALT 79 (H) 07/01/2023   AST 57 (H) 07/01/2023   GGT 96 (H) 12/27/2022   ALKPHOS 205 (H) 07/01/2023   BILITOT 1.4 (H) 07/01/2023       Danelle Earthly, MD [facility] 07/02/2023, 6:09 AM I have personally spent 52 minutes involved in face-to-face and non-face-to-face activities for this patient on the day of the visit. Professional time spent includes the following activities: Preparing to see the patient (review of tests), Obtaining and/or reviewing separately obtained history (admission/discharge record), Performing a medically appropriate  examination and/or evaluation , Ordering medications/tests/procedures, referring and communicating with other health care professionals, Documenting clinical information in the EMR, Independently interpreting results (not separately reported), Communicating results to the patient/family/caregiver, Counseling and educating the patient/family/caregiver and Care coordination (not separately reported).

## 2023-07-03 ENCOUNTER — Telehealth: Payer: Self-pay | Admitting: *Deleted

## 2023-07-03 ENCOUNTER — Other Ambulatory Visit (HOSPITAL_COMMUNITY): Payer: Self-pay

## 2023-07-03 ENCOUNTER — Other Ambulatory Visit: Payer: Self-pay | Admitting: Student

## 2023-07-03 LAB — URINE DRUGS OF ABUSE SCREEN W ALC, ROUTINE (REF LAB)
Barbiturate, Ur: NEGATIVE ng/mL
Benzodiazepine Quant, Ur: NEGATIVE ng/mL
Cocaine (Metab.): NEGATIVE ng/mL
Ethanol U, Quan: NEGATIVE %
Methadone Screen, Urine: NEGATIVE ng/mL
Opiate Quant, Ur: NEGATIVE ng/mL
Phencyclidine, Ur: NEGATIVE ng/mL
Propoxyphene, Urine: NEGATIVE ng/mL

## 2023-07-03 LAB — PANEL 799049
CARBOXY THC GC/MS CONF: 15 ng/mL
Cannabinoid GC/MS, Ur: POSITIVE — AB

## 2023-07-03 LAB — AMPHETAMINE CONF, UR
Amphetamine GC/MS Conf: 1081 ng/mL
Amphetamine, Ur: POSITIVE — AB
Amphetamines: POSITIVE — AB
Methamphetamine Quant, Ur: >3000 ng/mL
Methamphetamine, Ur: POSITIVE — AB

## 2023-07-03 NOTE — Transitions of Care (Post Inpatient/ED Visit) (Signed)
   07/03/2023  Name: Dakota Ramirez MRN: [id number] DOB: 11/01/1970  Today's TOC FU Call Status: Today's TOC FU Call Status:: Unsuccessful Call (2nd Attempt) Unsuccessful Call (2nd Attempt) Date: 07/03/23  Attempted to reach the patient regarding the most recent Inpatient/ED visit.  Follow Up Plan: Additional outreach attempts will be made to reach the patient to complete the Transitions of Care (Post Inpatient/ED visit) call.   Irving Shows [facility], BSN RN Care Manager/ Transition of Care [place]/ [facility] [phone]

## 2023-07-04 ENCOUNTER — Telehealth: Payer: Self-pay | Admitting: *Deleted

## 2023-07-04 ENCOUNTER — Other Ambulatory Visit (HOSPITAL_COMMUNITY): Payer: Self-pay

## 2023-07-04 NOTE — Transitions of Care (Post Inpatient/ED Visit) (Signed)
   07/04/2023  Name: [name] MRN: [id number] DOB: 27-Jun-1971  Today's TOC FU Call Status: Today's TOC FU Call Status:: Unsuccessful Call (3rd Attempt) Unsuccessful Call (3rd Attempt) Date: 07/04/23  Attempted to reach the patient regarding the most recent Inpatient/ED visit.  Follow Up Plan: No further outreach attempts will be made at this time. We have been unable to contact the patient.  Irving Shows [facility], BSN RN Care Manager/ Transition of Care / [facility] [phone]

## 2023-07-05 LAB — CULTURE, BLOOD (ROUTINE X 2)
Culture: NO GROWTH
Culture: NO GROWTH
Special Requests: ADEQUATE
Special Requests: ADEQUATE

## 2023-07-09 ENCOUNTER — Encounter (HOSPITAL_COMMUNITY): Payer: Self-pay

## 2023-07-09 ENCOUNTER — Ambulatory Visit (HOSPITAL_COMMUNITY)
Admit: 2023-07-09 | Discharge: 2023-07-09 | Disposition: A | Discharge status: Home / Self Care | Payer: 59 | Source: Ambulatory Visit | Attending: Physician Assistant | Admitting: Physician Assistant

## 2023-07-09 VITALS — BP 110/80 | HR 98 | Ht 71.0 in | Wt 282.8 lb

## 2023-07-09 DIAGNOSIS — Z91199 Patient's noncompliance with other medical treatment and regimen due to unspecified reason: Secondary | ICD-10-CM | POA: Insufficient documentation

## 2023-07-09 DIAGNOSIS — L97419 Non-pressure chronic ulcer of right heel and midfoot with unspecified severity: Secondary | ICD-10-CM | POA: Insufficient documentation

## 2023-07-09 DIAGNOSIS — S91309A Unspecified open wound, unspecified foot, initial encounter: Secondary | ICD-10-CM | POA: Diagnosis not present

## 2023-07-09 DIAGNOSIS — Z79899 Other long term (current) drug therapy: Secondary | ICD-10-CM | POA: Insufficient documentation

## 2023-07-09 DIAGNOSIS — Z8249 Family history of ischemic heart disease and other diseases of the circulatory system: Secondary | ICD-10-CM | POA: Diagnosis not present

## 2023-07-09 DIAGNOSIS — Z794 Long term (current) use of insulin: Secondary | ICD-10-CM | POA: Diagnosis not present

## 2023-07-09 DIAGNOSIS — M7989 Other specified soft tissue disorders: Secondary | ICD-10-CM | POA: Diagnosis not present

## 2023-07-09 DIAGNOSIS — I5082 Biventricular heart failure: Secondary | ICD-10-CM | POA: Insufficient documentation

## 2023-07-09 DIAGNOSIS — M869 Osteomyelitis, unspecified: Secondary | ICD-10-CM | POA: Insufficient documentation

## 2023-07-09 DIAGNOSIS — I5022 Chronic systolic (congestive) heart failure: Secondary | ICD-10-CM | POA: Insufficient documentation

## 2023-07-09 DIAGNOSIS — B9689 Other specified bacterial agents as the cause of diseases classified elsewhere: Secondary | ICD-10-CM | POA: Diagnosis not present

## 2023-07-09 DIAGNOSIS — R0602 Shortness of breath: Secondary | ICD-10-CM | POA: Diagnosis not present

## 2023-07-09 DIAGNOSIS — I428 Other cardiomyopathies: Secondary | ICD-10-CM | POA: Insufficient documentation

## 2023-07-09 DIAGNOSIS — E1165 Type 2 diabetes mellitus with hyperglycemia: Secondary | ICD-10-CM | POA: Diagnosis not present

## 2023-07-09 DIAGNOSIS — N179 Acute kidney failure, unspecified: Secondary | ICD-10-CM | POA: Diagnosis not present

## 2023-07-09 DIAGNOSIS — I110 Hypertensive heart disease with heart failure: Secondary | ICD-10-CM | POA: Insufficient documentation

## 2023-07-09 LAB — COMPREHENSIVE METABOLIC PANEL
ALT: 24 U/L (ref 0–44)
AST: 27 U/L (ref 15–41)
Albumin: 1.6 g/dL — ABNORMAL LOW (ref 3.5–5.0)
Alkaline Phosphatase: 226 U/L — ABNORMAL HIGH (ref 38–126)
Anion gap: 7 (ref 5–15)
BUN: 29 mg/dL — ABNORMAL HIGH (ref 6–20)
CO2: 32 mmol/L (ref 22–32)
Calcium: 7.9 mg/dL — ABNORMAL LOW (ref 8.9–10.3)
Chloride: 90 mmol/L — ABNORMAL LOW (ref 98–111)
Creatinine, Ser: 1.2 mg/dL (ref 0.61–1.24)
GFR, Estimated: >60 mL/min (ref >60)
Glucose, Bld: 263 mg/dL — ABNORMAL HIGH (ref 70–99)
Potassium: 5.5 mmol/L — ABNORMAL HIGH (ref 3.5–5.1)
Sodium: 129 mmol/L — ABNORMAL LOW (ref 135–145)
Total Bilirubin: 0.8 mg/dL (ref <1.2)
Total Protein: 8.3 g/dL — ABNORMAL HIGH (ref 6.5–8.1)

## 2023-07-09 LAB — BRAIN NATRIURETIC PEPTIDE: B Natriuretic Peptide: 1441 pg/mL — ABNORMAL HIGH (ref 0.0–100.0)

## 2023-07-09 MED ORDER — SPIRONOLACTONE 25 MG PO TABS: 25.0000 mg | ORAL_TABLET | Freq: Every day | ORAL | 3 refills | Status: DC

## 2023-07-09 MED ORDER — POTASSIUM CHLORIDE CRYS ER 20 MEQ PO TBCR: 40.0000 meq | EXTENDED_RELEASE_TABLET | Freq: Two times a day (BID) | ORAL | 6 refills | Status: DC

## 2023-07-09 MED ORDER — METOLAZONE 2.5 MG PO TABS: 2.5000 mg | ORAL_TABLET | Freq: Once | ORAL | 0 refills | Status: DC

## 2023-07-09 MED ORDER — TORSEMIDE 20 MG PO TABS: ORAL_TABLET | ORAL | 5 refills | Status: DC

## 2023-07-09 NOTE — Patient Instructions (Addendum)
Increase Spiro to 25 mg (one whole pill) daily. Increase torsemide to 60 mg (3 pills) in am and 40 mg (2 pills) daily. Take 2.5 mg metolazone x one dose. Increase potassium to 40 meq twice daily. Take an extra 40 meq with your metolazone. Return to Heart Failure Clinic in one week - see below. Bring your medications, please. Please call us at [phone] if any questions or issues prior to your next visit.

## 2023-07-09 NOTE — Progress Notes (Addendum)
ADVANCED HF CLINIC NOTE   Primary Care: [facility] Dept. HF Cardiologist: Dr. Gala Romney  HPI: Dakota Ramirez is a 52 y.o. 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    Admitted 4/23 with new-onset acute HF.  Echo EF 20-25%, regional WMA, moderately dilated LV, RV moderately reduced, RVSP 40 mmHg, mild to moderate MR, dilated IVC with estimated RAP 15 mmHg. Cards consulted and GDMT started. 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  Overall diuresed 50 lbs. No SGLT2i with uncontrolled DM. Discharged home, weight 259 lbs.   cMRI 4/24 showed severe biventricular failure, LVEF 17%. RVEF 22% LGE imaging very poor quality, which limits interpretation. 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   Admitted 05/24 for acute on chronic HFrEF. Echo showed EF <20%, LV with GHK, RV mildly reduced, LA/RA sev dilated, mild-mod MR, mild TR, trivial pericardial effusion. GDMT titrated. Dr Lajoyce Corners consulted for chronic osteomyelitis and Unna boot placed on RLE, no indication for surgical intervention.  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  Admitted 06/25/23 with mixed cardiogenic and septic shock secondary to LE cellulitis/wounds w/ ? osteomyelitis. He required pressor support with norepinephrine. UDS + for amphetamines. Advanced heart failure consulted. Diuresed with IV lasix and GDMT slowly added back. Echo with EF 20-25%, RV mildly reduced, new mobile density on posterior mitral valve leaflet. Subsequently had TEE which did not show any evidence of vegetation. ID consulted. 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 Additional imaging to foot wounds also suggested. He refused to stay for additional antibiotics and treatment. Not a candidate for home antibiotics and PICC line d/t substance  abuse.  Here today for hospital follow-up. Has been staying with his cousin who wants to help take care of him. Gets short of breath with minimal exertion. Reports he has been putting pills in a weekly organizer, tries to be compliant with medications. Legs are swollen up this abdomen. Very fatigued. Family has been helping him wrap his right heal wound, states his left heel wound has healed. Denies fevers, chills. He does not want to be readmitted to the hospital. He is frustrated by his declining health.  Cardiac Studies: -TEE (11/24): EF < 20%, RV mildly reduced, no vegetation on mitral valve, moderate MR  - Echo (11/24) EF 20-25%, RV mildly reduced, mobile density on posterior leaflet mitral valve, mild MR - Echo (5/24): EF < 20%, LV with GHK, RV mildly reduced, LA/RA sev dilated, mild-mod MR, mild TR, trivial pericardial effusion.   - Bedside echo (9/23): EF 20-25%  - Echo (4/23): EF 20-25%, regional WMA, moderately dilated LV, RV moderately reduced, RVSP 40 mmHg, mild to moderate MR, dilated IVC with estimated RAP 15 mmHg.  - R/LHC (4/23):    The left ventricular ejection fraction is less than 25% by visual estimate.  Ao = 97/67 (85) LV = 105/25 RA = 8 RV = 37/14 PA = 35/22 (25) PCW = 20 Fick cardiac output/index = 5.9/2.4 PVR = 0.8 WU SVR = 1040 Ao sat = 97% PA sat = 66%, 67%   1. Normal coronary arteries 2. 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 Mildly elevated filling pressures with normal cardiac output  cMRI (4/24): poor quality, LVEF 17%, RVEF 22%,  appears to have RV insertion site LGE (which is a nonspecific scar finding often seen in setting of  elevated pulmonary pressures) and basal septal midwall LGE (which is a scar pattern seen in NICM and associated with worse prognosis)   Past Medical History:  Diagnosis Date   CHF (congestive heart failure) (HCC)    Diabetes mellitus without complication (HCC)    Type II   Heart failure with reduced ejection fraction (HCC) 11/26/2021    Hypertension    Sepsis with encephalopathy and septic shock (HCC) 06/26/2023   Shock (HCC) 06/26/2023   Current Outpatient Medications  Medication Sig Dispense Refill   carvedilol (COREG) 6.25 MG tablet Take 1 tablet (6.25 mg total) by mouth 2 (two) times daily. 180 tablet 0   cefadroxil (DURICEF) 500 MG capsule Take 1 capsule (500 mg total) by mouth 2 (two) times daily. 28 capsule 0   linezolid (ZYVOX) 600 MG tablet Take 1 tablet (600 mg total) by mouth 2 (two) times daily. 28 tablet 0   metolazone (ZAROXOLYN) 2.5 MG tablet Take 1 tablet (2.5 mg total) by mouth once for 1 dose. 3 tablet 0   NOVOLOG MIX 70/30 FLEXPEN (70-30) 100 UNIT/ML FlexPen Inject 30 Units into the skin 2 (two) times daily with a meal. 15 mL 3   potassium chloride SA (KLOR-CON M) 20 MEQ tablet Take 2 tablets (40 mEq total) by mouth 2 (two) times daily. Take extra 40 meq on day you take metolazone. 120 tablet 6   spironolactone (ALDACTONE) 25 MG tablet Take 1 tablet (25 mg total) by mouth daily. 90 tablet 3   torsemide (DEMADEX) 20 MG tablet Take 60 mg in am and 40 mg in pm 150 tablet 5   No current facility-administered medications for this encounter.   No Known Allergies  Social History   Socioeconomic History   Marital status: Single    Spouse name: Not on file   Number of children: Not on file   Years of education: Not on file   Highest education level: Not on file  Occupational History   Not on file  Tobacco Use   Smoking status: Never   Smokeless tobacco: Never  Vaping Use   Vaping status: Never Used  Substance and Sexual Activity   Alcohol use: Not Currently    Comment: rare   Drug use: Never   Sexual activity: Not on file  Other Topics Concern   Not on file  Social History Narrative   Not on file   Social Determinants of Health   Financial Resource Strain: High Risk (01/10/2023)   Overall Financial Resource Strain (CARDIA)    Difficulty of Paying Living Expenses: Hard  Food Insecurity:  Food Insecurity Present (12/28/2022)   Hunger Vital Sign    Worried About Running Out of Food in the Last Year: Often true    Ran Out of Food in the Last Year: Often true  Transportation Needs: No Transportation Needs (11/29/2021)   PRAPARE - Administrator, Civil Service (Medical): No    Lack of Transportation (Non-Medical): No  Physical Activity: Not on file  Stress: No Stress Concern Present (02/28/2021)   Received from [facility], [facility] of Occupational Health - Occupational Stress Questionnaire    Feeling of Stress : Not at all  Social Connections: Unknown (12/15/2021)   Received from [facility], [facility]   Social Network    Social Network: Not on file  Intimate Partner Violence: Unknown (11/14/2021)   Received from [facility], [facility]   HITS    Physically Hurt: Not on  file    Insult or Talk Down To: Not on file    Threaten Physical Harm: Not on file    Scream or Curse: Not on file   Family History  Problem Relation Age of Onset   Diabetes Mother    Hypertension Mother    BP 110/80   Pulse 98   Ht 5\' 11"  (1.803 m)   Wt 128.3 kg (282 lb 12.8 oz)   SpO2 94%   BMI 39.44 kg/m   Wt Readings from Last 3 Encounters:  07/09/23 128.3 kg (282 lb 12.8 oz)  07/01/23 117 kg (257 lb 15 oz)  04/22/23 118.8 kg (262 lb)   PHYSICAL EXAM: General:  Fatigued appearing HEENT: + temporal wasting Neck: supple. JVP ~ 10.  Cor: Regular rate & rhythm. No rubs, gallops or murmurs. Lungs: clear Abdomen: soft, nontender, + distended.  Extremities: no cyanosis, clubbing, rash, 3+ edema to waist, legs are warm and erythematous, multiple healing wounds on face and extremities Neuro: alert & orientedx3. Upset affect.    ECG: ST 100   ASSESSMENT & PLAN:  Chronic Systolic Heart Failure - NICM. Reports hx CHF in his mother. No hx alcohol or drug abuse. - Echo (4/23): EF 20-25%, regional WMA, moderately dilated LV, RV moderately reduced,  RVSP 40 mmHg, mild to moderate MR, dilated IVC with estimated RAP 15 mmHg - R/LHC (4/23): showed normal cors, severe NICM EF 20-25% and mildly elevated filling pressures w/ normal CO - cMRI (4/24): showed severe biventricular failure, LVEF 17%. RVEF 22% LGE imaging very poor quality, which limits interpretation. Appears to have RV insertion site LGE (associated with worse prognosis) - Echo (5/24): EF < 20%, LV with GHK, RV mildly reduced, LA/RA sev dilated, mild-mod MR, mild TR, trivial pericardial effusion.  - Echo 11/24: EF 20-25%, RV mildly reduced, mild MR - NYHA IIIb. Massively volume overloaded, not fully diuresed prior to discharge home. ReDS only 30% but R>L heart failure. He is adamant he does not want to be readmitted. Increase Torsemide to 60 q am and 40 q pm. Take 2.5 mg metolazone X 1. Increase K to 40 mEq BID and take extra 40 mEq K with metolazone. - Continue Coreg 6.25 mg bid - Increase spiro to 25 mg daily - No SGLT2i with uncontrolled DM and concern for hygiene. - Not candidate for ICD with ongoing LE wounds/infection.  - Labs today  2. Chronic foot wounds -Treated for cellulitis/osteomyelitis recent admit -Xray left foot concerning for osteomyelitis -No evidence of vegetation on TEE -Seen by ID during admit. Concerned for culture neg endocarditis. He left AMA. Discharged with course of po cefadroxil and linezolid. Not candidate for home PICC line. -Declines referral to ID and wound center. He states he will call to get f/u with Dr. Lajoyce Corners. He understands the risk of developing life-threatening septic shock.  2. Uncontrolled DM2 - Most recent A1C 10.3 - On insulin - Per PCP   3. Hx osteomyelitis s/p right TMA - 2/2 poorly controlled DM   4. HTN - BP stable today   5. Hypoalbuminemia - Liver US 5/24 with echogenic parenchyma, ? Hepatic steatosis or fibrosis. Cannot exclude cirrhosis.  6. Recent AKI -Scr peaked at 4.8 during recent admit in setting of shock -Improved  to baseline at discharge -Labs today  SDOH  - Now has medicaid - Lives in a camper behind father's house. Currently staying with his cousin. Unemployed and has limited social support. - Declined Paramedicine   Follow-up: 1 week with  APP to reassess volume and check labs  [facility], Dalbert Garnet, PA-C 07/09/23 5:08 PM

## 2023-07-09 NOTE — Progress Notes (Signed)
ReDS Vest / Clip - 07/09/23 1232       ReDS Vest / Clip   Station Marker D    Ruler Value 42    ReDS Value Range Low volume    ReDS Actual Value 30

## 2023-07-10 ENCOUNTER — Encounter (HOSPITAL_COMMUNITY): Payer: Self-pay | Admitting: *Deleted

## 2023-07-10 ENCOUNTER — Telehealth (HOSPITAL_COMMUNITY): Payer: Self-pay | Admitting: *Deleted

## 2023-07-10 DIAGNOSIS — I428 Other cardiomyopathies: Secondary | ICD-10-CM

## 2023-07-10 DIAGNOSIS — I5022 Chronic systolic (congestive) heart failure: Secondary | ICD-10-CM

## 2023-07-10 MED ORDER — POTASSIUM CHLORIDE CRYS ER 20 MEQ PO TBCR: 20.0000 meq | EXTENDED_RELEASE_TABLET | Freq: Every day | ORAL | Status: DC

## 2023-07-10 NOTE — Telephone Encounter (Signed)
-----   Message from [facility], [place] N sent at 07/09/2023  4:36 PM EST ----- Potassium is elevated. Please have him stop taking potassium X 1 day then restart at just 20 mEq daily.

## 2023-07-10 NOTE — Telephone Encounter (Signed)
  Noralee Space, RN 07/10/2023  4:36 PM EST Back to Top    N/A, VM full, mychart mess sent   Emer Arminda Resides, RN 07/10/2023  2:22 PM EST     Tried calling patient. No answer and unable to leave message as mailbox is full   Theresia Bough, [facility] 07/09/2023  4:38 PM EST     Patient called. No answer unable to leave message VM full   Andrey Farmer, PA-C 07/09/2023  4:36 PM EST     Potassium is elevated. Please have him stop taking potassium X 1 day then restart at just 20 mEq daily.

## 2023-07-14 DIAGNOSIS — Z419 Encounter for procedure for purposes other than remedying health state, unspecified: Secondary | ICD-10-CM | POA: Diagnosis not present

## 2023-07-15 ENCOUNTER — Encounter (HOSPITAL_COMMUNITY): Payer: Self-pay

## 2023-07-15 ENCOUNTER — Ambulatory Visit (HOSPITAL_COMMUNITY)
Admission: RE | Admit: 2023-07-15 | Discharge: 2023-07-15 | Disposition: A | Discharge status: Home / Self Care | Payer: 59 | Source: Ambulatory Visit | Attending: Cardiology | Admitting: Cardiology

## 2023-07-15 VITALS — BP 104/82 | HR 127 | Wt 285.2 lb

## 2023-07-15 DIAGNOSIS — Z5972 Insufficient welfare support: Secondary | ICD-10-CM | POA: Diagnosis not present

## 2023-07-15 DIAGNOSIS — M868X7 Other osteomyelitis, ankle and foot: Secondary | ICD-10-CM | POA: Insufficient documentation

## 2023-07-15 DIAGNOSIS — I5020 Unspecified systolic (congestive) heart failure: Secondary | ICD-10-CM

## 2023-07-15 DIAGNOSIS — E1165 Type 2 diabetes mellitus with hyperglycemia: Secondary | ICD-10-CM | POA: Insufficient documentation

## 2023-07-15 DIAGNOSIS — E8809 Other disorders of plasma-protein metabolism, not elsewhere classified: Secondary | ICD-10-CM | POA: Insufficient documentation

## 2023-07-15 DIAGNOSIS — I5023 Acute on chronic systolic (congestive) heart failure: Secondary | ICD-10-CM | POA: Diagnosis not present

## 2023-07-15 DIAGNOSIS — I5022 Chronic systolic (congestive) heart failure: Secondary | ICD-10-CM

## 2023-07-15 DIAGNOSIS — R9431 Abnormal electrocardiogram [ECG] [EKG]: Secondary | ICD-10-CM | POA: Insufficient documentation

## 2023-07-15 DIAGNOSIS — I429 Cardiomyopathy, unspecified: Secondary | ICD-10-CM | POA: Diagnosis not present

## 2023-07-15 DIAGNOSIS — I428 Other cardiomyopathies: Secondary | ICD-10-CM | POA: Diagnosis not present

## 2023-07-15 DIAGNOSIS — Z560 Unemployment, unspecified: Secondary | ICD-10-CM | POA: Diagnosis not present

## 2023-07-15 DIAGNOSIS — E1169 Type 2 diabetes mellitus with other specified complication: Secondary | ICD-10-CM | POA: Diagnosis not present

## 2023-07-15 DIAGNOSIS — Z87448 Personal history of other diseases of urinary system: Secondary | ICD-10-CM | POA: Insufficient documentation

## 2023-07-15 DIAGNOSIS — Z5941 Food insecurity: Secondary | ICD-10-CM | POA: Diagnosis not present

## 2023-07-15 DIAGNOSIS — I110 Hypertensive heart disease with heart failure: Secondary | ICD-10-CM | POA: Insufficient documentation

## 2023-07-15 LAB — BASIC METABOLIC PANEL
Anion gap: 9 (ref 5–15)
BUN: 19 mg/dL (ref 6–20)
CO2: 26 mmol/L (ref 22–32)
Calcium: 7.9 mg/dL — ABNORMAL LOW (ref 8.9–10.3)
Chloride: 98 mmol/L (ref 98–111)
Creatinine, Ser: 1.06 mg/dL (ref 0.61–1.24)
GFR, Estimated: >60 mL/min (ref >60)
Glucose, Bld: 261 mg/dL — ABNORMAL HIGH (ref 70–99)
Potassium: 4 mmol/L (ref 3.5–5.1)
Sodium: 133 mmol/L — ABNORMAL LOW (ref 135–145)

## 2023-07-15 MED ORDER — TORSEMIDE 20 MG PO TABS: 60.0000 mg | ORAL_TABLET | Freq: Two times a day (BID) | ORAL | 5 refills | Status: DC

## 2023-07-15 MED ORDER — METOLAZONE 2.5 MG PO TABS: 2.5000 mg | ORAL_TABLET | Freq: Once | ORAL | 0 refills | Status: DC

## 2023-07-15 NOTE — Progress Notes (Signed)
ADVANCED HF CLINIC NOTE   Primary Care: [facility] Dept. HF Cardiologist: Dr. Gala Romney  HPI: Dakota Ramirez is a 52 y.o. 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    Admitted 4/23 with new-onset acute HF.  Echo EF 20-25%, regional WMA, moderately dilated LV, RV moderately reduced, RVSP 40 mmHg, mild to moderate MR, dilated IVC with estimated RAP 15 mmHg. Cards consulted and GDMT started. 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  Overall diuresed 50 lbs. No SGLT2i with uncontrolled DM. Discharged home, weight 259 lbs.   cMRI 4/24 showed severe biventricular failure, LVEF 17%. RVEF 22% LGE imaging very poor quality, which limits interpretation. 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   Admitted 05/24 for acute on chronic HFrEF. Echo showed EF <20%, LV with GHK, RV mildly reduced, LA/RA sev dilated, mild-mod MR, mild TR, trivial pericardial effusion. GDMT titrated. Dr Lajoyce Corners consulted for chronic osteomyelitis and Unna boot placed on RLE, no indication for surgical intervention.  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  Admitted 06/25/23 with mixed cardiogenic and septic shock secondary to LE cellulitis/wounds w/ ? osteomyelitis. He required pressor support with norepinephrine. UDS + for amphetamines. Advanced heart failure consulted. Diuresed with IV lasix and GDMT slowly added back. Echo with EF 20-25%, RV mildly reduced, new mobile density on posterior mitral valve leaflet. Subsequently had TEE which did not show any evidence of vegetation. ID consulted. 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 Additional imaging to foot wounds also suggested. He refused to stay for additional antibiotics and treatment. Not a candidate for home antibiotics and PICC line d/t substance  abuse.  He was seen for post hospital f/u on 11/26 and endorsed NYHA IIIb symptoms. Was still massively volume overloaded (not fully diuresed prior to discharge home given his unwillingness to stay in hospital). ReDS was only 30% but R>L heart failure. He was adamant he did not want to be readmitted. We increased his Torsemide to 60 q am and 40 q pm. Instructed to take 2.5 mg metolazone X 1. Cleda Daub was also increased to 25 mg daily.   He returns back today for 1 wk f/u. His wt remains elevated. He was 257 lb when discharged. He is 285 lb today (up 3 lb from last wks visit). C/w fluid overload on exam, though he reports scrotal swelling has improved. He reports full med compliance but history unreliable. States decent UOP w/ recent med changes. BP 104/82. EKG shows sinus tach 126 bpm. I informed pt that it would be best to have him readmitted for IV Lasix to safely and effectively diurese but he is adamant that he does not want to be readmitted. He would like to further push home diuretics. He denies current resting dyspnea. NYHA Class III w/ activity. Unable to get ReDs measurement (reading low quality).    Cardiac Studies: -TEE (11/24): EF < 20%, RV mildly reduced, no vegetation on mitral valve, moderate MR  - Echo (11/24) EF 20-25%, RV mildly reduced, mobile density on posterior leaflet mitral valve, mild MR - Echo (5/24): EF < 20%, LV with GHK, RV mildly reduced, LA/RA sev dilated, mild-mod MR, mild TR, trivial pericardial effusion.   - Bedside echo (9/23): EF 20-25%  - Echo (4/23): EF 20-25%, regional WMA, moderately dilated LV, RV moderately reduced, RVSP 40 mmHg, mild  to moderate MR, dilated IVC with estimated RAP 15 mmHg.  - R/LHC (4/23):    The left ventricular ejection fraction is less than 25% by visual estimate.  Ao = 97/67 (85) LV = 105/25 RA = 8 RV = 37/14 PA = 35/22 (25) PCW = 20 Fick cardiac output/index = 5.9/2.4 PVR = 0.8 WU SVR = 1040 Ao sat = 97% PA sat = 66%, 67%    1. Normal coronary arteries 2. 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 Mildly elevated filling pressures with normal cardiac output  cMRI (4/24): poor quality, LVEF 17%, RVEF 22%,  appears to have RV insertion site LGE (which is a nonspecific scar finding often seen in setting of elevated pulmonary pressures) and basal septal midwall LGE (which is a scar pattern seen in NICM and associated with worse prognosis)   Past Medical History:  Diagnosis Date   CHF (congestive heart failure) (HCC)    Diabetes mellitus without complication (HCC)    Type II   Heart failure with reduced ejection fraction (HCC) 11/26/2021   Hypertension    Sepsis with encephalopathy and septic shock (HCC) 06/26/2023   Shock (HCC) 06/26/2023   Current Outpatient Medications  Medication Sig Dispense Refill   carvedilol (COREG) 6.25 MG tablet Take 1 tablet (6.25 mg total) by mouth 2 (two) times daily. 180 tablet 0   cefadroxil (DURICEF) 500 MG capsule Take 1 capsule (500 mg total) by mouth 2 (two) times daily. 28 capsule 0   linezolid (ZYVOX) 600 MG tablet Take 1 tablet (600 mg total) by mouth 2 (two) times daily. 28 tablet 0   metolazone (ZAROXOLYN) 2.5 MG tablet Take 1 tablet (2.5 mg total) by mouth once for 1 dose. 3 tablet 0   NOVOLOG MIX 70/30 FLEXPEN (70-30) 100 UNIT/ML FlexPen Inject 30 Units into the skin 2 (two) times daily with a meal. 15 mL 3   potassium chloride SA (KLOR-CON M) 20 MEQ tablet Take 1 tablet (20 mEq total) by mouth daily. Take extra 40 meq on day you take metolazone.     spironolactone (ALDACTONE) 25 MG tablet Take 1 tablet (25 mg total) by mouth daily. 90 tablet 3   torsemide (DEMADEX) 20 MG tablet Take 60 mg in am and 40 mg in pm 150 tablet 5   No current facility-administered medications for this encounter.   No Known Allergies  Social History   Socioeconomic History   Marital status: Single    Spouse name: Not on file   Number of children: Not on file   Years of education: Not on file    Highest education level: Not on file  Occupational History   Not on file  Tobacco Use   Smoking status: Never   Smokeless tobacco: Never  Vaping Use   Vaping status: Never Used  Substance and Sexual Activity   Alcohol use: Not Currently    Comment: rare   Drug use: Never   Sexual activity: Not on file  Other Topics Concern   Not on file  Social History Narrative   Not on file   Social Determinants of Health   Financial Resource Strain: High Risk (01/10/2023)   Overall Financial Resource Strain (CARDIA)    Difficulty of Paying Living Expenses: Hard  Food Insecurity: Food Insecurity Present (12/28/2022)   Hunger Vital Sign    Worried About Running Out of Food in the Last Year: Often true    Ran Out of Food in the Last Year: Often true  Transportation Needs: No  Transportation Needs (11/29/2021)   PRAPARE - Administrator, Civil Service (Medical): No    Lack of Transportation (Non-Medical): No  Physical Activity: Not on file  Stress: No Stress Concern Present (02/28/2021)   Received from [facility], [facility] - Occupational Stress Questionnaire    Feeling of Stress : Not at all  Social Connections: Unknown (12/15/2021)   Received from [facility], [facility]   Social Network    Social Network: Not on file  Intimate Partner Violence: Unknown (11/14/2021)   Received from [facility], [facility]   HITS    Physically Hurt: Not on file    Insult or Talk Down To: Not on file    Threaten Physical Harm: Not on file    Scream or Curse: Not on file   Family History  Problem Relation Age of Onset   Diabetes Mother    Hypertension Mother    BP 104/82   Pulse (!) 127   Wt 129.4 kg (285 lb 3.2 oz)   SpO2 100%   BMI 39.78 kg/m   Wt Readings from Last 3 Encounters:  07/15/23 129.4 kg (285 lb 3.2 oz)  07/09/23 128.3 kg (282 lb 12.8 oz)  07/01/23 117 kg (257 lb 15 oz)   PHYSICAL EXAM: General:  obese,  disheveled appearing. No respiratory difficulty HEENT: normal Neck: supple. JVD to jaw. Carotids 2+ bilat; no bruits. No lymphadenopathy or thyromegaly appreciated. Cor: PMI nondisplaced. Regular rhythm and tachy rate. No rubs, gallops or murmurs. Lungs: decreased BS at the bases bilaterally  Abdomen: soft, nontender, +distended. + abdominal wall edema, No hepatosplenomegaly. No bruits or masses. Good bowel sounds. Extremities: no cyanosis, clubbing, rash, 2+ b/l LE edema up to thighs/abdomen  Neuro: alert & oriented x 3, cranial nerves grossly intact. moves all 4 extremities w/o difficulty. Affect pleasant.    EKG: sinus tach 126 bpm    ASSESSMENT & PLAN:  Acute on Chronic Systolic Heart Failure - NICM. Reports hx CHF in his mother. No hx alcohol or drug abuse. 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 RVEF 22% LGE imaging very poor quality, which limits interpretation. Appears to have RV insertion site LGE (associated with worse prognosis) - Echo (5/24): EF < 20%, LV with GHK, RV mildly reduced, LA/RA sev dilated, mild-mod MR, mild TR, trivial pericardial effusion.  - Echo 11/24: EF 20-25%, RV mildly reduced, mild MR - NYHA IIIb. Remains massively volume overloaded, not fully diuresed prior to discharge home (left AMA). I also worry about potential low output w/ soft BP and tachycardia. I informed pt that it would be best to have him readmitted for IV Lasix to safely and effectively diurese but he is adamant that he does not want to be readmitted. He would like to further push home diuretics. - Increase torsemide to 60 mg bid - take metolazone 2.5 mg daily x 3 days and take extra 40 mEq K with  metolazone. - continue spironolactone 25 mg daily  - No SGLT2i with uncontrolled DM and concern for hygiene. - Continue Coreg 6.25 mg bid. No dose titration given need for further diuresis and potential low output - he would benefit from digoxin  but I worry about compliance  - Not candidate for ICD with ongoing LE wounds/infection.  - Not a candidate for advanced therapies given poor compliance and co morbidities   2. Chronic foot wounds -Treated for cellulitis/osteomyelitis recent admit -Xray left foot concerning for osteomyelitis -No evidence of vegetation on TEE -Seen by ID during recent admit. Concerned for culture neg endocarditis. He left AMA. Discharged with course of po cefadroxil and linezolid. Not candidate for home PICC line. -Declines referral to ID and wound center. He states he will call to get f/u with Dr. Lajoyce Corners. He understands the risk of developing life-threatening septic shock.  2. Uncontrolled DM2 - Most recent A1C 10.3 - On insulin - Per PCP   3. Hx osteomyelitis s/p right TMA - 2/2 poorly controlled DM   4. HTN - controlled on current regimen - GDMT per above - check BMP    5. Hypoalbuminemia - Liver US 5/24 with echogenic parenchyma, ? Hepatic steatosis or fibrosis. Cannot exclude cirrhosis.  6. Recent AKI -Scr peaked at 4.8 during recent admit in setting of shock -Improved to baseline at discharge. Scr 1.20 last clinic visit  -Repeat BMP today   SDOH  - Now has medicaid - Lives in a camper behind father's house. Currently staying with his cousin. Unemployed and has limited social support. - Declined Paramedicine  Keep f/u in 1 wk to reassess volume status.   Robbie Lis, PA-C 07/15/23 4:16 PM

## 2023-07-15 NOTE — Patient Instructions (Addendum)
Labs done today. We will contact you only if your labs are abnormal.  INCREASE Torsemide to 60mg  (3 tablets) by mouth 2 times daily.   START Metolazone 2.5mg  (1 tablet) by mouth daily for 3 days along with an extra 2 tablets of Potassium for 3 days.   No other medication changes were made. Please continue all current medications as prescribed.  Your physician recommends that you schedule a follow-up appointment in: 1 week  If you have any questions or concerns before your next appointment please send [place] a message through [place] or call our office at [phone].    TO LEAVE A MESSAGE FOR THE NURSE SELECT OPTION 2, PLEASE LEAVE A MESSAGE INCLUDING: YOUR NAME DATE OF BIRTH CALL BACK NUMBER REASON FOR CALL**this is important as we prioritize the call backs  YOU WILL RECEIVE A CALL BACK THE SAME DAY AS LONG AS YOU CALL BEFORE 4:00 PM   Do the following things EVERYDAY: Weigh yourself in the morning before breakfast. Write it down and keep it in a log. Take your medicines as prescribed Eat low salt foods--Limit salt (sodium) to 2000 mg per day.  Stay as active as you can everyday Limit all fluids for the day to less than 2 liters   At the [facility], you and your health needs are our priority. As part of our continuing mission to provide you with exceptional heart care, we have created designated Provider Care Teams. These Care Teams include your primary Cardiologist (physician) and Advanced Practice Providers (APPs- Physician Assistants and Nurse Practitioners) who all work together to provide you with the care you need, when you need it.   You may see any of the following providers on your designated Care Team at your next follow up: Dr Arvilla Meres Dr Marca Ancona Dr. Marcos Eke, NP Robbie Lis, [place] [facility] [place], [place] Brynda Peon, NP Karle Plumber, PharmD   Please be sure to bring in all your medications bottles to  every appointment.    Thank you for choosing [facility]

## 2023-07-22 ENCOUNTER — Encounter: Payer: 59 | Admitting: Student

## 2023-07-24 ENCOUNTER — Encounter (HOSPITAL_COMMUNITY): Payer: Self-pay

## 2023-07-24 ENCOUNTER — Ambulatory Visit (HOSPITAL_COMMUNITY)
Admission: RE | Admit: 2023-07-24 | Discharge: 2023-07-24 | Disposition: A | Discharge status: Home / Self Care | Payer: 59 | Source: Ambulatory Visit | Attending: Family Medicine | Admitting: Family Medicine

## 2023-07-24 VITALS — BP 124/90 | HR 119 | Wt 308.2 lb

## 2023-07-24 DIAGNOSIS — I110 Hypertensive heart disease with heart failure: Secondary | ICD-10-CM | POA: Insufficient documentation

## 2023-07-24 DIAGNOSIS — I428 Other cardiomyopathies: Secondary | ICD-10-CM | POA: Diagnosis not present

## 2023-07-24 DIAGNOSIS — I10 Essential (primary) hypertension: Secondary | ICD-10-CM | POA: Diagnosis not present

## 2023-07-24 DIAGNOSIS — Z8739 Personal history of other diseases of the musculoskeletal system and connective tissue: Secondary | ICD-10-CM | POA: Diagnosis not present

## 2023-07-24 DIAGNOSIS — Z5989 Other problems related to housing and economic circumstances: Secondary | ICD-10-CM | POA: Diagnosis not present

## 2023-07-24 DIAGNOSIS — Z560 Unemployment, unspecified: Secondary | ICD-10-CM | POA: Diagnosis not present

## 2023-07-24 DIAGNOSIS — M868X7 Other osteomyelitis, ankle and foot: Secondary | ICD-10-CM | POA: Diagnosis not present

## 2023-07-24 DIAGNOSIS — Z139 Encounter for screening, unspecified: Secondary | ICD-10-CM | POA: Diagnosis not present

## 2023-07-24 DIAGNOSIS — E8809 Other disorders of plasma-protein metabolism, not elsewhere classified: Secondary | ICD-10-CM | POA: Diagnosis not present

## 2023-07-24 DIAGNOSIS — E1165 Type 2 diabetes mellitus with hyperglycemia: Secondary | ICD-10-CM | POA: Diagnosis present

## 2023-07-24 DIAGNOSIS — E1159 Type 2 diabetes mellitus with other circulatory complications: Secondary | ICD-10-CM | POA: Diagnosis not present

## 2023-07-24 DIAGNOSIS — E1169 Type 2 diabetes mellitus with other specified complication: Secondary | ICD-10-CM | POA: Insufficient documentation

## 2023-07-24 DIAGNOSIS — I5022 Chronic systolic (congestive) heart failure: Secondary | ICD-10-CM | POA: Diagnosis present

## 2023-07-24 DIAGNOSIS — I5023 Acute on chronic systolic (congestive) heart failure: Secondary | ICD-10-CM | POA: Insufficient documentation

## 2023-07-24 DIAGNOSIS — Z794 Long term (current) use of insulin: Secondary | ICD-10-CM | POA: Insufficient documentation

## 2023-07-24 LAB — COMPREHENSIVE METABOLIC PANEL
ALT: 23 U/L (ref 0–44)
AST: 48 U/L — ABNORMAL HIGH (ref 15–41)
Albumin: 1.8 g/dL — ABNORMAL LOW (ref 3.5–5.0)
Alkaline Phosphatase: 191 U/L — ABNORMAL HIGH (ref 38–126)
Anion gap: 10 (ref 5–15)
BUN: 35 mg/dL — ABNORMAL HIGH (ref 6–20)
CO2: 22 mmol/L (ref 22–32)
Calcium: 7.8 mg/dL — ABNORMAL LOW (ref 8.9–10.3)
Chloride: 97 mmol/L — ABNORMAL LOW (ref 98–111)
Creatinine, Ser: 2.22 mg/dL — ABNORMAL HIGH (ref 0.61–1.24)
GFR, Estimated: 35 mL/min — ABNORMAL LOW (ref >60)
Glucose, Bld: 197 mg/dL — ABNORMAL HIGH (ref 70–99)
Potassium: 4.6 mmol/L (ref 3.5–5.1)
Sodium: 129 mmol/L — ABNORMAL LOW (ref 135–145)
Total Bilirubin: 1.2 mg/dL — ABNORMAL HIGH (ref <1.2)
Total Protein: 8.5 g/dL — ABNORMAL HIGH (ref 6.5–8.1)

## 2023-07-24 LAB — BRAIN NATRIURETIC PEPTIDE: B Natriuretic Peptide: 1670.4 pg/mL — ABNORMAL HIGH (ref 0.0–100.0)

## 2023-07-24 MED ORDER — TORSEMIDE 20 MG PO TABS: 80.0000 mg | ORAL_TABLET | Freq: Two times a day (BID) | ORAL | 5 refills | Status: DC

## 2023-07-24 MED ORDER — POTASSIUM CHLORIDE CRYS ER 20 MEQ PO TBCR: 40.0000 meq | EXTENDED_RELEASE_TABLET | Freq: Every day | ORAL | 5 refills | Status: DC

## 2023-07-24 MED ORDER — METOLAZONE 5 MG PO TABS: 5.0000 mg | ORAL_TABLET | ORAL | 3 refills | Status: DC

## 2023-07-24 NOTE — Progress Notes (Signed)
Supplied patient with samples:  Furoscix 80 mg; 2 samples LN [id number] Ex 12/10/24

## 2023-07-24 NOTE — Progress Notes (Signed)
ReDS Vest / Clip - 07/24/23 1400       ReDS Vest / Clip   Station Marker D    Ruler Value 40    ReDS Value Range Moderate volume overload    ReDS Actual Value 40

## 2023-07-24 NOTE — Progress Notes (Signed)
ADVANCED HF CLINIC NOTE   Primary Care: [facility] Dept. HF Cardiologist: Dr. Gala Romney  HPI: Dakota Ramirez is a 52 y.o. 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    Admitted 4/23 with new-onset acute HF.  Given IV lasix. Echo EF 20-25%, regional WMA, moderately dilated LV, RV moderately reduced, RVSP 40 mmHg, mild to moderate MR, dilated IVC with estimated RAP 15 mmHg. Cards consulted and GDMT started. 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  Overall diuresed 50 lbs. No SGLT2i with uncontrolled DM. Discharged home, weight 259 lbs.   cMRI 4/24 showed severe biventricular failure, LVEF 17%. RVEF 22% LGE imaging very poor quality, which limits interpretation. 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   Follow up 12/26/22, NYHA IIIb, markedly volume overload weight up 40 lbs, ReDs 42%. Advised to go to ED for evaluation, where he was admitted. Diuresed with IV lasix. Echo showed EF <20%, LV with GHK, RV mildly reduced, LA/RA sev dilated, mild-mod MR, mild TR, trivial pericardial effusion. GDMT titrated. Dr Lajoyce Corners consulted for chronic osteomyelitis and Unna boot placed on RLE, no indication for surgical intervention.  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  Admitted 06/25/23 with mixed cardiogenic and septic shock secondary to LE cellulitis/wounds w/ ? osteomyelitis. He required pressor support with norepinephrine. UDS + for amphetamines. Advanced heart failure consulted. Diuresed with IV lasix and GDMT slowly added back. Echo with EF 20-25%, RV mildly reduced, new mobile density on posterior mitral valve leaflet. Subsequently had TEE which did not show any evidence of vegetation. ID consulted. 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 Additional imaging to  foot wounds also suggested. He refused to stay for additional antibiotics and treatment. Not a candidate for home antibiotics and PICC line d/t substance abuse.   Post hospital follow up 11/24, NYHA IIIb and volume overloaded. Concern for CGS. Diuretics increased with close follow up. He was unwilling to be re-admitted.  Today he returns for HF follow up with his nephew. Overall feeling fair. [name] last week after tripping over a pipe and more SOB, he attributes his dyspnea to rib soreness from fall. Has occasional dizziness. Legs are swelling. Denies palpations, CP, or PND/Orthopnea. Appetite ok. No fever or chills. Not weighing at home. Taking all medications. Rare ETOH, used crystal meth a month ago.  Cardiac Studies: - TEE (11/24): no evidence of vegetation.  - Echo (11/24): EF 20-25%, RV mildly reduced, new mobile density on posterior mitral valve leaflet.  - Echo (5/24): EF < 20%, LV with GHK, RV mildly reduced, LA/RA sev dilated, mild-mod MR, mild TR, trivial pericardial effusion.   - Bedside echo (9/23): EF 20-25%  - Echo (4/23): EF 20-25%, regional WMA, moderately dilated LV, RV moderately reduced, RVSP 40 mmHg, mild to moderate MR, dilated IVC with estimated RAP 15 mmHg.  - R/LHC (4/23):    The left ventricular ejection fraction is less than 25% by visual estimate.  Ao = 97/67 (85) LV = 105/25 RA = 8 RV = 37/14 PA = 35/22 (25) PCW = 20 Fick cardiac output/index = 5.9/2.4 PVR = 0.8 WU SVR = 1040 Ao sat = 97% PA sat = 66%, 67%   1. Normal coronary arteries 2. 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 Mildly elevated filling pressures with normal cardiac output  cMRI (4/24): poor  quality, LVEF 17%, RVEF 22%,  appears to have RV insertion site LGE (which is a nonspecific scar finding often seen in setting of elevated pulmonary pressures) and basal septal midwall LGE (which is a scar pattern seen in NICM and associated with worse prognosis)   Past Medical History:  Diagnosis Date   CHF  (congestive heart failure) (HCC)    Diabetes mellitus without complication (HCC)    Type II   Heart failure with reduced ejection fraction (HCC) 11/26/2021   Hypertension    Sepsis with encephalopathy and septic shock (HCC) 06/26/2023   Shock (HCC) 06/26/2023   Current Outpatient Medications  Medication Sig Dispense Refill   carvedilol (COREG) 6.25 MG tablet Take 1 tablet (6.25 mg total) by mouth 2 (two) times daily. 180 tablet 0   cefadroxil (DURICEF) 500 MG capsule Take 1 capsule (500 mg total) by mouth 2 (two) times daily. 28 capsule 0   linezolid (ZYVOX) 600 MG tablet Take 1 tablet (600 mg total) by mouth 2 (two) times daily. 28 tablet 0   NOVOLOG MIX 70/30 FLEXPEN (70-30) 100 UNIT/ML FlexPen Inject 30 Units into the skin 2 (two) times daily with a meal. 15 mL 3   potassium chloride SA (KLOR-CON M) 20 MEQ tablet Take 1 tablet (20 mEq total) by mouth daily. Take extra 40 meq on day you take metolazone.     spironolactone (ALDACTONE) 25 MG tablet Take 1 tablet (25 mg total) by mouth daily. 90 tablet 3   torsemide (DEMADEX) 20 MG tablet Take 3 tablets (60 mg total) by mouth 2 (two) times daily. 180 tablet 5   No current facility-administered medications for this encounter.   No Known Allergies  Social History   Socioeconomic History   Marital status: Single    Spouse name: Not on file   Number of children: Not on file   Years of education: Not on file   Highest education level: Not on file  Occupational History   Not on file  Tobacco Use   Smoking status: Never   Smokeless tobacco: Never  Vaping Use   Vaping status: Never Used  Substance and Sexual Activity   Alcohol use: Not Currently    Comment: rare   Drug use: Never   Sexual activity: Not on file  Other Topics Concern   Not on file  Social History Narrative   Not on file   Social Determinants of Health   Financial Resource Strain: High Risk (01/10/2023)   Overall Financial Resource Strain (CARDIA)    Difficulty  of Paying Living Expenses: Hard  Food Insecurity: Food Insecurity Present (12/28/2022)   Hunger Vital Sign    Worried About Running Out of Food in the Last Year: Often true    Ran Out of Food in the Last Year: Often true  Transportation Needs: No Transportation Needs (11/29/2021)   PRAPARE - Administrator, Civil Service (Medical): No    Lack of Transportation (Non-Medical): No  Physical Activity: Not on file  Stress: No Stress Concern Present (02/28/2021)   Received from [facility], [facility] of Occupational [facility] - Occupational Stress Questionnaire    Feeling of Stress : Not at all  Social Connections: Unknown (12/15/2021)   Received from [facility], [facility]   Social Network    Social Network: Not on file  Intimate Partner Violence: Unknown (11/14/2021)   Received from [facility], [facility]   HITS    Physically Hurt: Not on  file    Insult or Talk Down To: Not on file    Threaten Physical Harm: Not on file    Scream or Curse: Not on file   Family History  Problem Relation Age of Onset   Diabetes Mother    Hypertension Mother    BP (!) 124/90   Pulse (!) 119   Wt (!) 139.8 kg (308 lb 3.2 oz)   SpO2 99%   BMI 42.99 kg/m   Wt Readings from Last 3 Encounters:  07/24/23 (!) 139.8 kg (308 lb 3.2 oz)  07/15/23 129.4 kg (285 lb 3.2 oz)  07/09/23 128.3 kg (282 lb 12.8 oz)   PHYSICAL EXAM: General:  NAD. No resp difficulty, arrived in [facility], chronically-ill appearing. + Jaundice HEENT: Normal; + facial sores in various stages of healing Neck: Supple. Thick neck, JVP to ear. Carotids 2+ bilat; no bruits. No lymphadenopathy or thryomegaly appreciated. Cor: PMI nondisplaced. Tachy regular rate & rhythm. No rubs, gallops or murmurs. Lungs: Clear, diminished in bases Abdomen: Soft, obese, nontender, +distended. No hepatosplenomegaly. No bruits or masses. Good bowel sounds. Extremities: No cyanosis, clubbing, rash, 3+ BLE edema into  thighs Neuro: Alert & oriented x 3, cranial nerves grossly intact. Moves all 4 extremities w/o difficulty. Affect pleasant.  ReDs: 40%   Acute on Chronic Systolic Heart Failure - NICM. Reports hx CHF in his mother. No hx alcohol or drug abuse. 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 RVEF 22% LGE imaging very poor quality, which limits interpretation. Appears to have RV insertion site LGE (associated with worse prognosis) - Echo (5/24): EF < 20%, LV with GHK, RV mildly reduced, LA/RA sev dilated, mild-mod MR, mild TR, trivial pericardial effusion.  - Echo 11/24: EF 20-25%, RV mildly reduced, mild MR - NYHA IIIb. Remains massively volume overloaded, weight up 23 lbs since last visit, REDs 40%.  I informed pt that it would be best to have him readmitted for IV Lasix to safely and effectively diurese but he is adamant that he does not want to be readmitted. He would like to further push home diuretics. - Arrange for Furoscix 80 mg bid + 40 KCL bid x 3 days, hold torsemide while using Furoscix - After 3 days, increase torsemide to 80 mg bid + increase KCL to 40 mg daily. - He will take metolazone 2.5 mg + extra 40 KCL x 3 days (when starting back on higher torsemide dosing) - Continue spironolactone 25 mg daily  - No SGLT2i with uncontrolled DM and concern for hygiene. - Continue Coreg 6.25 mg bid. No dose titration given need for further diuresis and potential low output - he would benefit from digoxin but I worry about compliance  - Not candidate for ICD with ongoing LE wounds/infection.  - Not a candidate for advanced therapies given poor compliance and co morbidities  - Labs today.   2. Chronic foot  wounds - Treated for cellulitis/osteomyelitis recent admit - Xray left foot concerning for osteomyelitis - No evidence of vegetation on TEE - Seen by ID during recent admit. Concerned for culture neg endocarditis. He left AMA. Discharged with course of po cefadroxil and linezolid. Not candidate for home PICC line. - Declines referral to ID and  wound center. He states he will call to get f/u with Dr. Lajoyce Corners. He understands the risk of developing life-threatening septic shock.   2. Uncontrolled DM2 - Most recent A1C 10.3 - On insulin - Per PCP   3. Hx osteomyelitis s/p right TMA - 2/2 poorly controlled DM    4. HTN - Controlled on current regimen - GDMT per above - Labs today.   5. Hypoalbuminemia - Liver US 5/24 with echogenic parenchyma, ? Hepatic steatosis or fibrosis.  - Cannot exclude cirrhosis.   6. Recent AKI - Scr peaked at 4.8 during recent admit in setting of shock - Improved to baseline at discharge. Scr 1.06 last clinic visit  - Repeat labs today   7. SDOH  - Now has medicaid - Lives in a camper behind father's house. Currently staying with his cousin. Unemployed and has limited social support. - Declined Paramedicine   Patient seen with Dr. Gasper Lloyd. Advised admission for IV diuresis +/- inotrope support, however he adamantly declines. Will attempt escalation of diuretics. He was given strict ED precautions.  Dakota Rome, FNP-BC 07/24/23

## 2023-07-24 NOTE — Patient Instructions (Signed)
Take Furoscix 80 mg twice daily for 3 days. Take potassium 40 meq twice daily on these days. Hold your Torsemide on these days. Re-start INCREASED DOSE OF TORSEMIDE 80 mg twice daily on Sunday. Increase potassium to 40 meq (two pills) daily. Labs today - will call you if abnormal. Return to Heart Failure APP Clinic in one week - see below. Please call us at [phone] if any questions or concerns before your next visit.

## 2023-07-26 ENCOUNTER — Telehealth (HOSPITAL_COMMUNITY): Payer: Self-pay | Admitting: Cardiology

## 2023-07-26 NOTE — Telephone Encounter (Signed)
Triage walk in Pt report second furoscix infuser was defective Sample provided to replace defective device    Medication Samples have been provided to the patient.  Drug name: furoscix       Strength: 80mg /98mL        Qty: 1  LOT: [id number]  Exp.Date: 12/10/2024  Dosing instructions: UAD  The patient has been instructed regarding the correct time, dose, and frequency of taking this medication, including desired effects and most common side effects.   Dakota Ramirez 3:56 PM 07/26/2023

## 2023-07-29 DIAGNOSIS — F419 Anxiety disorder, unspecified: Secondary | ICD-10-CM | POA: Diagnosis present

## 2023-07-29 DIAGNOSIS — R570 Cardiogenic shock: Secondary | ICD-10-CM | POA: Diagnosis present

## 2023-07-29 DIAGNOSIS — Q5564 Hidden penis: Secondary | ICD-10-CM

## 2023-07-29 DIAGNOSIS — E878 Other disorders of electrolyte and fluid balance, not elsewhere classified: Secondary | ICD-10-CM | POA: Diagnosis present

## 2023-07-29 DIAGNOSIS — E8809 Other disorders of plasma-protein metabolism, not elsewhere classified: Secondary | ICD-10-CM | POA: Diagnosis present

## 2023-07-29 DIAGNOSIS — Z89421 Acquired absence of other right toe(s): Secondary | ICD-10-CM | POA: Diagnosis not present

## 2023-07-29 DIAGNOSIS — I110 Hypertensive heart disease with heart failure: Principal | ICD-10-CM | POA: Diagnosis present

## 2023-07-29 DIAGNOSIS — I5023 Acute on chronic systolic (congestive) heart failure: Secondary | ICD-10-CM | POA: Diagnosis present

## 2023-07-29 DIAGNOSIS — E876 Hypokalemia: Secondary | ICD-10-CM | POA: Diagnosis present

## 2023-07-29 DIAGNOSIS — I517 Cardiomegaly: Secondary | ICD-10-CM | POA: Diagnosis not present

## 2023-07-29 DIAGNOSIS — Z833 Family history of diabetes mellitus: Secondary | ICD-10-CM

## 2023-07-29 DIAGNOSIS — Z9049 Acquired absence of other specified parts of digestive tract: Secondary | ICD-10-CM

## 2023-07-29 DIAGNOSIS — Z89431 Acquired absence of right foot: Secondary | ICD-10-CM

## 2023-07-29 DIAGNOSIS — N433 Hydrocele, unspecified: Secondary | ICD-10-CM | POA: Diagnosis not present

## 2023-07-29 DIAGNOSIS — R451 Restlessness and agitation: Secondary | ICD-10-CM | POA: Diagnosis present

## 2023-07-29 DIAGNOSIS — Z515 Encounter for palliative care: Secondary | ICD-10-CM

## 2023-07-29 DIAGNOSIS — L089 Local infection of the skin and subcutaneous tissue, unspecified: Secondary | ICD-10-CM | POA: Diagnosis not present

## 2023-07-29 DIAGNOSIS — I509 Heart failure, unspecified: Secondary | ICD-10-CM | POA: Diagnosis not present

## 2023-07-29 DIAGNOSIS — I428 Other cardiomyopathies: Secondary | ICD-10-CM | POA: Diagnosis present

## 2023-07-29 DIAGNOSIS — L97429 Non-pressure chronic ulcer of left heel and midfoot with unspecified severity: Secondary | ICD-10-CM | POA: Diagnosis present

## 2023-07-29 DIAGNOSIS — L304 Erythema intertrigo: Secondary | ICD-10-CM | POA: Diagnosis present

## 2023-07-29 DIAGNOSIS — M86672 Other chronic osteomyelitis, left ankle and foot: Secondary | ICD-10-CM | POA: Diagnosis present

## 2023-07-29 DIAGNOSIS — N4883 Acquired buried penis: Secondary | ICD-10-CM | POA: Diagnosis present

## 2023-07-29 DIAGNOSIS — Z452 Encounter for adjustment and management of vascular access device: Secondary | ICD-10-CM | POA: Diagnosis not present

## 2023-07-29 DIAGNOSIS — E872 Acidosis, unspecified: Secondary | ICD-10-CM | POA: Diagnosis present

## 2023-07-29 DIAGNOSIS — Z89412 Acquired absence of left great toe: Secondary | ICD-10-CM

## 2023-07-29 DIAGNOSIS — Z6841 Body Mass Index (BMI) 40.0 and over, adult: Secondary | ICD-10-CM

## 2023-07-29 DIAGNOSIS — B379 Candidiasis, unspecified: Secondary | ICD-10-CM | POA: Diagnosis present

## 2023-07-29 DIAGNOSIS — E11621 Type 2 diabetes mellitus with foot ulcer: Secondary | ICD-10-CM | POA: Diagnosis present

## 2023-07-29 DIAGNOSIS — T3696XA Underdosing of unspecified systemic antibiotic, initial encounter: Secondary | ICD-10-CM | POA: Diagnosis present

## 2023-07-29 DIAGNOSIS — N5089 Other specified disorders of the male genital organs: Secondary | ICD-10-CM | POA: Diagnosis not present

## 2023-07-29 DIAGNOSIS — Z560 Unemployment, unspecified: Secondary | ICD-10-CM

## 2023-07-29 DIAGNOSIS — I5082 Biventricular heart failure: Secondary | ICD-10-CM | POA: Diagnosis present

## 2023-07-29 DIAGNOSIS — Z794 Long term (current) use of insulin: Secondary | ICD-10-CM

## 2023-07-29 DIAGNOSIS — Z66 Do not resuscitate: Secondary | ICD-10-CM | POA: Diagnosis present

## 2023-07-29 DIAGNOSIS — E1165 Type 2 diabetes mellitus with hyperglycemia: Secondary | ICD-10-CM | POA: Diagnosis present

## 2023-07-29 DIAGNOSIS — Y92009 Unspecified place in unspecified non-institutional (private) residence as the place of occurrence of the external cause: Secondary | ICD-10-CM

## 2023-07-29 DIAGNOSIS — S2242XA Multiple fractures of ribs, left side, initial encounter for closed fracture: Secondary | ICD-10-CM | POA: Diagnosis not present

## 2023-07-29 DIAGNOSIS — Z79899 Other long term (current) drug therapy: Secondary | ICD-10-CM

## 2023-07-29 DIAGNOSIS — Z89411 Acquired absence of right great toe: Secondary | ICD-10-CM | POA: Diagnosis not present

## 2023-07-29 DIAGNOSIS — R0600 Dyspnea, unspecified: Secondary | ICD-10-CM | POA: Diagnosis not present

## 2023-07-29 DIAGNOSIS — E46 Unspecified protein-calorie malnutrition: Secondary | ICD-10-CM | POA: Diagnosis present

## 2023-07-29 DIAGNOSIS — Z602 Problems related to living alone: Secondary | ICD-10-CM | POA: Diagnosis present

## 2023-07-29 DIAGNOSIS — Z91128 Patient's intentional underdosing of medication regimen for other reason: Secondary | ICD-10-CM

## 2023-07-29 DIAGNOSIS — Z5941 Food insecurity: Secondary | ICD-10-CM

## 2023-07-29 DIAGNOSIS — E1169 Type 2 diabetes mellitus with other specified complication: Secondary | ICD-10-CM | POA: Diagnosis present

## 2023-07-29 DIAGNOSIS — Z8249 Family history of ischemic heart disease and other diseases of the circulatory system: Secondary | ICD-10-CM

## 2023-07-30 ENCOUNTER — Other Ambulatory Visit: Payer: Self-pay

## 2023-07-30 ENCOUNTER — Emergency Department (HOSPITAL_COMMUNITY): Payer: 59

## 2023-07-30 ENCOUNTER — Inpatient Hospital Stay (HOSPITAL_COMMUNITY)
Admission: EM | Admit: 2023-07-30 | Discharge: 2023-08-04 | DRG: 291 | Disposition: A | Discharge status: Home Health | Payer: 59 | Attending: Student in an Organized Health Care Education/Training Program | Admitting: Student in an Organized Health Care Education/Training Program

## 2023-07-30 ENCOUNTER — Observation Stay (HOSPITAL_COMMUNITY): Payer: 59

## 2023-07-30 ENCOUNTER — Inpatient Hospital Stay (HOSPITAL_COMMUNITY): Payer: 59

## 2023-07-30 ENCOUNTER — Encounter (HOSPITAL_COMMUNITY): Payer: Self-pay

## 2023-07-30 DIAGNOSIS — Z515 Encounter for palliative care: Secondary | ICD-10-CM | POA: Diagnosis not present

## 2023-07-30 DIAGNOSIS — I5023 Acute on chronic systolic (congestive) heart failure: Secondary | ICD-10-CM

## 2023-07-30 DIAGNOSIS — N433 Hydrocele, unspecified: Secondary | ICD-10-CM | POA: Diagnosis not present

## 2023-07-30 DIAGNOSIS — E1165 Type 2 diabetes mellitus with hyperglycemia: Secondary | ICD-10-CM | POA: Diagnosis not present

## 2023-07-30 DIAGNOSIS — I5022 Chronic systolic (congestive) heart failure: Secondary | ICD-10-CM

## 2023-07-30 DIAGNOSIS — Z794 Long term (current) use of insulin: Secondary | ICD-10-CM | POA: Diagnosis not present

## 2023-07-30 DIAGNOSIS — E46 Unspecified protein-calorie malnutrition: Secondary | ICD-10-CM | POA: Diagnosis not present

## 2023-07-30 DIAGNOSIS — E11621 Type 2 diabetes mellitus with foot ulcer: Secondary | ICD-10-CM | POA: Diagnosis not present

## 2023-07-30 DIAGNOSIS — I517 Cardiomegaly: Secondary | ICD-10-CM | POA: Diagnosis not present

## 2023-07-30 DIAGNOSIS — N5089 Other specified disorders of the male genital organs: Secondary | ICD-10-CM | POA: Diagnosis not present

## 2023-07-30 DIAGNOSIS — R570 Cardiogenic shock: Secondary | ICD-10-CM | POA: Diagnosis not present

## 2023-07-30 DIAGNOSIS — B379 Candidiasis, unspecified: Secondary | ICD-10-CM | POA: Diagnosis not present

## 2023-07-30 DIAGNOSIS — L97429 Non-pressure chronic ulcer of left heel and midfoot with unspecified severity: Secondary | ICD-10-CM | POA: Diagnosis not present

## 2023-07-30 DIAGNOSIS — R339 Retention of urine, unspecified: Secondary | ICD-10-CM | POA: Diagnosis not present

## 2023-07-30 DIAGNOSIS — M86172 Other acute osteomyelitis, left ankle and foot: Secondary | ICD-10-CM | POA: Diagnosis not present

## 2023-07-30 DIAGNOSIS — Z89431 Acquired absence of right foot: Secondary | ICD-10-CM | POA: Diagnosis not present

## 2023-07-30 DIAGNOSIS — I110 Hypertensive heart disease with heart failure: Secondary | ICD-10-CM | POA: Diagnosis not present

## 2023-07-30 DIAGNOSIS — A419 Sepsis, unspecified organism: Principal | ICD-10-CM

## 2023-07-30 DIAGNOSIS — Z66 Do not resuscitate: Secondary | ICD-10-CM | POA: Diagnosis not present

## 2023-07-30 DIAGNOSIS — Z89421 Acquired absence of other right toe(s): Secondary | ICD-10-CM | POA: Diagnosis not present

## 2023-07-30 DIAGNOSIS — L97509 Non-pressure chronic ulcer of other part of unspecified foot with unspecified severity: Secondary | ICD-10-CM | POA: Diagnosis not present

## 2023-07-30 DIAGNOSIS — T839XXA Unspecified complication of genitourinary prosthetic device, implant and graft, initial encounter: Secondary | ICD-10-CM | POA: Diagnosis not present

## 2023-07-30 DIAGNOSIS — Y92009 Unspecified place in unspecified non-institutional (private) residence as the place of occurrence of the external cause: Secondary | ICD-10-CM | POA: Diagnosis not present

## 2023-07-30 DIAGNOSIS — Z89411 Acquired absence of right great toe: Secondary | ICD-10-CM | POA: Diagnosis not present

## 2023-07-30 DIAGNOSIS — S2242XA Multiple fractures of ribs, left side, initial encounter for closed fracture: Secondary | ICD-10-CM | POA: Diagnosis not present

## 2023-07-30 DIAGNOSIS — I70219 Atherosclerosis of native arteries of extremities with intermittent claudication, unspecified extremity: Secondary | ICD-10-CM | POA: Diagnosis not present

## 2023-07-30 DIAGNOSIS — E1169 Type 2 diabetes mellitus with other specified complication: Secondary | ICD-10-CM | POA: Diagnosis not present

## 2023-07-30 DIAGNOSIS — Z79899 Other long term (current) drug therapy: Secondary | ICD-10-CM | POA: Diagnosis not present

## 2023-07-30 DIAGNOSIS — Z6841 Body Mass Index (BMI) 40.0 and over, adult: Secondary | ICD-10-CM | POA: Diagnosis not present

## 2023-07-30 DIAGNOSIS — I509 Heart failure, unspecified: Secondary | ICD-10-CM | POA: Diagnosis not present

## 2023-07-30 DIAGNOSIS — M86672 Other chronic osteomyelitis, left ankle and foot: Secondary | ICD-10-CM | POA: Diagnosis not present

## 2023-07-30 DIAGNOSIS — E8809 Other disorders of plasma-protein metabolism, not elsewhere classified: Secondary | ICD-10-CM | POA: Diagnosis not present

## 2023-07-30 DIAGNOSIS — Z7189 Other specified counseling: Secondary | ICD-10-CM | POA: Diagnosis not present

## 2023-07-30 DIAGNOSIS — M65972 Unspecified synovitis and tenosynovitis, left ankle and foot: Secondary | ICD-10-CM | POA: Diagnosis not present

## 2023-07-30 DIAGNOSIS — L089 Local infection of the skin and subcutaneous tissue, unspecified: Secondary | ICD-10-CM | POA: Diagnosis not present

## 2023-07-30 DIAGNOSIS — I5082 Biventricular heart failure: Secondary | ICD-10-CM | POA: Diagnosis not present

## 2023-07-30 DIAGNOSIS — I428 Other cardiomyopathies: Secondary | ICD-10-CM

## 2023-07-30 DIAGNOSIS — S91302A Unspecified open wound, left foot, initial encounter: Secondary | ICD-10-CM | POA: Diagnosis not present

## 2023-07-30 DIAGNOSIS — E876 Hypokalemia: Secondary | ICD-10-CM | POA: Diagnosis not present

## 2023-07-30 DIAGNOSIS — Q5564 Hidden penis: Secondary | ICD-10-CM | POA: Diagnosis not present

## 2023-07-30 DIAGNOSIS — M7732 Calcaneal spur, left foot: Secondary | ICD-10-CM | POA: Diagnosis not present

## 2023-07-30 DIAGNOSIS — R0602 Shortness of breath: Secondary | ICD-10-CM | POA: Diagnosis not present

## 2023-07-30 DIAGNOSIS — E872 Acidosis, unspecified: Secondary | ICD-10-CM | POA: Diagnosis not present

## 2023-07-30 DIAGNOSIS — R600 Localized edema: Secondary | ICD-10-CM | POA: Diagnosis not present

## 2023-07-30 DIAGNOSIS — Z452 Encounter for adjustment and management of vascular access device: Secondary | ICD-10-CM | POA: Diagnosis not present

## 2023-07-30 DIAGNOSIS — R0600 Dyspnea, unspecified: Secondary | ICD-10-CM | POA: Diagnosis not present

## 2023-07-30 LAB — BASIC METABOLIC PANEL
Anion gap: 10 (ref 5–15)
BUN: 25 mg/dL — ABNORMAL HIGH (ref 6–20)
CO2: 26 mmol/L (ref 22–32)
Calcium: 8.3 mg/dL — ABNORMAL LOW (ref 8.9–10.3)
Chloride: 97 mmol/L — ABNORMAL LOW (ref 98–111)
Creatinine, Ser: 1.05 mg/dL (ref 0.61–1.24)
GFR, Estimated: >60 mL/min (ref >60)
Glucose, Bld: 281 mg/dL — ABNORMAL HIGH (ref 70–99)
Potassium: 4.6 mmol/L (ref 3.5–5.1)
Sodium: 133 mmol/L — ABNORMAL LOW (ref 135–145)

## 2023-07-30 LAB — COMPREHENSIVE METABOLIC PANEL
ALT: 18 U/L (ref 0–44)
AST: 30 U/L (ref 15–41)
Albumin: 1.8 g/dL — ABNORMAL LOW (ref 3.5–5.0)
Alkaline Phosphatase: 174 U/L — ABNORMAL HIGH (ref 38–126)
Anion gap: 7 (ref 5–15)
BUN: 25 mg/dL — ABNORMAL HIGH (ref 6–20)
CO2: 25 mmol/L (ref 22–32)
Calcium: 8.2 mg/dL — ABNORMAL LOW (ref 8.9–10.3)
Chloride: 101 mmol/L (ref 98–111)
Creatinine, Ser: 0.95 mg/dL (ref 0.61–1.24)
GFR, Estimated: >60 mL/min (ref >60)
Glucose, Bld: 178 mg/dL — ABNORMAL HIGH (ref 70–99)
Potassium: 4.2 mmol/L (ref 3.5–5.1)
Sodium: 133 mmol/L — ABNORMAL LOW (ref 135–145)
Total Bilirubin: 1 mg/dL (ref <1.2)
Total Protein: 8.1 g/dL (ref 6.5–8.1)

## 2023-07-30 LAB — URINALYSIS, ROUTINE W REFLEX MICROSCOPIC
Bilirubin Urine: NEGATIVE
Glucose, UA: 50 mg/dL — AB
Ketones, ur: NEGATIVE mg/dL
Leukocytes,Ua: NEGATIVE
Nitrite: NEGATIVE
Protein, ur: 100 mg/dL — AB
Specific Gravity, Urine: 1.011 (ref 1.005–1.030)
pH: 5 (ref 5.0–8.0)

## 2023-07-30 LAB — CBC
HCT: 40.7 % (ref 39.0–52.0)
Hemoglobin: 12.6 g/dL — ABNORMAL LOW (ref 13.0–17.0)
MCH: 27.8 pg (ref 26.0–34.0)
MCHC: 31 g/dL (ref 30.0–36.0)
MCV: 89.6 fL (ref 80.0–100.0)
Platelets: 256 10*3/uL (ref 150–400)
RBC: 4.54 MIL/uL (ref 4.22–5.81)
RDW: 17.8 % — ABNORMAL HIGH (ref 11.5–15.5)
WBC: 8.8 10*3/uL (ref 4.0–10.5)
nRBC: 0.5 % — ABNORMAL HIGH (ref 0.0–0.2)

## 2023-07-30 LAB — HEPATIC FUNCTION PANEL
ALT: 20 U/L (ref 0–44)
AST: 31 U/L (ref 15–41)
Albumin: 1.8 g/dL — ABNORMAL LOW (ref 3.5–5.0)
Alkaline Phosphatase: 177 U/L — ABNORMAL HIGH (ref 38–126)
Bilirubin, Direct: 0.4 mg/dL — ABNORMAL HIGH (ref 0.0–0.2)
Indirect Bilirubin: 0.5 mg/dL (ref 0.3–0.9)
Total Bilirubin: 0.9 mg/dL (ref <1.2)
Total Protein: 8.1 g/dL (ref 6.5–8.1)

## 2023-07-30 LAB — I-STAT CG4 LACTIC ACID, ED
Lactic Acid, Venous: 3.1 mmol/L (ref 0.5–1.9)
Lactic Acid, Venous: 2.5 mmol/L (ref 0.5–1.9)
Lactic Acid, Venous: 2.1 mmol/L (ref 0.5–1.9)

## 2023-07-30 LAB — GLUCOSE, CAPILLARY
Glucose-Capillary: 183 mg/dL — ABNORMAL HIGH (ref 70–99)
Glucose-Capillary: 125 mg/dL — ABNORMAL HIGH (ref 70–99)

## 2023-07-30 LAB — TROPONIN I (HIGH SENSITIVITY)
Troponin I (High Sensitivity): 12 ng/L (ref <18)
Troponin I (High Sensitivity): 15 ng/L (ref <18)

## 2023-07-30 LAB — BRAIN NATRIURETIC PEPTIDE: B Natriuretic Peptide: 2303.1 pg/mL — ABNORMAL HIGH (ref 0.0–100.0)

## 2023-07-30 LAB — VAS US ABI WITH/WO TBI
Left ABI: 1.15
Right ABI: 1.2

## 2023-07-30 LAB — CBG MONITORING, ED
Glucose-Capillary: 243 mg/dL — ABNORMAL HIGH (ref 70–99)
Glucose-Capillary: 175 mg/dL — ABNORMAL HIGH (ref 70–99)

## 2023-07-30 LAB — CREATININE, SERUM
Creatinine, Ser: 1.11 mg/dL (ref 0.61–1.24)
GFR, Estimated: >60 mL/min (ref >60)

## 2023-07-30 LAB — MAGNESIUM: Magnesium: 1.2 mg/dL — ABNORMAL LOW (ref 1.7–2.4)

## 2023-07-30 MED ORDER — INSULIN ASPART 100 UNIT/ML IJ SOLN
0.0000 [IU] | Freq: Three times a day (TID) | INTRAMUSCULAR | Status: DC
  Administered 2023-07-30 (×2): 3 [IU] via SUBCUTANEOUS
  Administered 2023-07-31: 2 [IU] via SUBCUTANEOUS
  Administered 2023-07-31: 3 [IU] via SUBCUTANEOUS
  Administered 2023-07-31: 2 [IU] via SUBCUTANEOUS
  Administered 2023-08-01: 3 [IU] via SUBCUTANEOUS
  Administered 2023-08-01: 8 [IU] via SUBCUTANEOUS
  Administered 2023-08-01 – 2023-08-02 (×2): 2 [IU] via SUBCUTANEOUS
  Administered 2023-08-02: 5 [IU] via SUBCUTANEOUS
  Administered 2023-08-02: 2 [IU] via SUBCUTANEOUS
  Administered 2023-08-03 (×2): 3 [IU] via SUBCUTANEOUS
  Administered 2023-08-03 – 2023-08-04 (×2): 2 [IU] via SUBCUTANEOUS

## 2023-07-30 MED ORDER — SPIRONOLACTONE 25 MG PO TABS
25.0000 mg | ORAL_TABLET | Freq: Every day | ORAL | Status: DC
  Administered 2023-07-30 – 2023-08-04 (×6): 25 mg via ORAL
  Filled 2023-07-30 (×6): qty 1

## 2023-07-30 MED ORDER — ACETAMINOPHEN 650 MG RE SUPP
650.0000 mg | Freq: Four times a day (QID) | RECTAL | Status: DC | PRN
Start: 2023-07-30 — End: 2023-08-03

## 2023-07-30 MED ORDER — CHLORHEXIDINE GLUCONATE CLOTH 2 % EX PADS
6.0000 | MEDICATED_PAD | Freq: Every day | CUTANEOUS | Status: DC
  Administered 2023-07-31 – 2023-08-04 (×5): 6 via TOPICAL

## 2023-07-30 MED ORDER — FUROSEMIDE 10 MG/ML IJ SOLN
120.0000 mg | Freq: Two times a day (BID) | INTRAVENOUS | Status: DC
  Administered 2023-07-30 – 2023-07-31 (×2): 120 mg via INTRAVENOUS
  Filled 2023-07-30: qty 10
  Filled 2023-07-30 (×2): qty 12
  Filled 2023-07-30: qty 2

## 2023-07-30 MED ORDER — DICLOFENAC SODIUM 1 % EX GEL
2.0000 g | Freq: Four times a day (QID) | CUTANEOUS | Status: DC
  Administered 2023-07-30 – 2023-08-04 (×17): 2 g via TOPICAL
  Filled 2023-07-30: qty 100

## 2023-07-30 MED ORDER — SODIUM CHLORIDE 0.9% FLUSH
10.0000 mL | Freq: Two times a day (BID) | INTRAVENOUS | Status: DC
  Administered 2023-07-30 – 2023-08-04 (×10): 10 mL via INTRAVENOUS

## 2023-07-30 MED ORDER — METRONIDAZOLE 500 MG/100ML IV SOLN
500.0000 mg | Freq: Once | INTRAVENOUS | Status: AC
  Administered 2023-07-30: 500 mg via INTRAVENOUS
  Filled 2023-07-30: qty 100

## 2023-07-30 MED ORDER — HYDROMORPHONE HCL 1 MG/ML IJ SOLN
0.5000 mg | Freq: Once | INTRAMUSCULAR | Status: DC
  Filled 2023-07-30: qty 0.5

## 2023-07-30 MED ORDER — HYDROMORPHONE HCL 1 MG/ML IJ SOLN
0.5000 mg | Freq: Once | INTRAMUSCULAR | Status: AC
  Administered 2023-07-30: 0.5 mg via INTRAVENOUS

## 2023-07-30 MED ORDER — VANCOMYCIN HCL IN DEXTROSE 1-5 GM/200ML-% IV SOLN
1000.0000 mg | Freq: Once | INTRAVENOUS | Status: AC
  Administered 2023-07-30: 1000 mg via INTRAVENOUS
  Filled 2023-07-30: qty 200

## 2023-07-30 MED ORDER — MELATONIN 3 MG PO TABS
3.0000 mg | ORAL_TABLET | Freq: Every day | ORAL | Status: DC
  Administered 2023-07-30 – 2023-08-03 (×5): 3 mg via ORAL
  Filled 2023-07-30 (×5): qty 1

## 2023-07-30 MED ORDER — INSULIN GLARGINE-YFGN 100 UNIT/ML ~~LOC~~ SOLN
20.0000 [IU] | Freq: Every day | SUBCUTANEOUS | Status: DC
  Administered 2023-07-30 – 2023-08-04 (×6): 20 [IU] via SUBCUTANEOUS
  Filled 2023-07-30 (×6): qty 0.2

## 2023-07-30 MED ORDER — ACETAMINOPHEN 325 MG PO TABS
650.0000 mg | ORAL_TABLET | Freq: Four times a day (QID) | ORAL | Status: DC | PRN
Start: 2023-07-30 — End: 2023-08-03
  Administered 2023-08-02: 650 mg via ORAL
  Filled 2023-07-30: qty 2

## 2023-07-30 MED ORDER — SODIUM CHLORIDE 0.9 % IV SOLN
2.0000 g | Freq: Three times a day (TID) | INTRAVENOUS | Status: DC
  Administered 2023-07-30 – 2023-08-04 (×15): 2 g via INTRAVENOUS
  Filled 2023-07-30 (×16): qty 12.5

## 2023-07-30 MED ORDER — SODIUM CHLORIDE 0.9 % IV BOLUS (SEPSIS)
500.0000 mL | Freq: Once | INTRAVENOUS | Status: AC
  Administered 2023-07-30: 500 mL via INTRAVENOUS

## 2023-07-30 MED ORDER — VANCOMYCIN HCL IN DEXTROSE 1-5 GM/200ML-% IV SOLN: 1000.0000 mg | Freq: Once | INTRAVENOUS | Status: DC

## 2023-07-30 MED ORDER — NYSTATIN 100000 UNIT/GM EX CREA
1.0000 | TOPICAL_CREAM | Freq: Two times a day (BID) | CUTANEOUS | Status: DC
  Administered 2023-07-30 – 2023-08-04 (×11): 1 via TOPICAL
  Filled 2023-07-30 (×2): qty 30

## 2023-07-30 MED ORDER — ORAL CARE MOUTH RINSE: 15.0000 mL | OROMUCOSAL | Status: DC | PRN

## 2023-07-30 MED ORDER — VANCOMYCIN HCL 1250 MG/250ML IV SOLN
1250.0000 mg | Freq: Two times a day (BID) | INTRAVENOUS | Status: DC
  Administered 2023-07-30 – 2023-07-31 (×3): 1250 mg via INTRAVENOUS
  Filled 2023-07-30 (×4): qty 250

## 2023-07-30 MED ORDER — SODIUM CHLORIDE 0.9 % IV SOLN
2.0000 g | Freq: Once | INTRAVENOUS | Status: AC
  Administered 2023-07-30: 2 g via INTRAVENOUS
  Filled 2023-07-30: qty 12.5

## 2023-07-30 MED ORDER — FUROSEMIDE 10 MG/ML IJ SOLN
120.0000 mg | Freq: Once | INTRAVENOUS | Status: AC
  Administered 2023-07-30: 120 mg via INTRAVENOUS
  Filled 2023-07-30: qty 10

## 2023-07-30 MED ORDER — ENOXAPARIN SODIUM 40 MG/0.4ML IJ SOSY
40.0000 mg | PREFILLED_SYRINGE | INTRAMUSCULAR | Status: DC
  Administered 2023-07-30 – 2023-08-04 (×6): 40 mg via SUBCUTANEOUS
  Filled 2023-07-30 (×6): qty 0.4

## 2023-07-30 MED ORDER — MAGNESIUM CHLORIDE 64 MG PO TBEC
1.0000 | DELAYED_RELEASE_TABLET | Freq: Every day | ORAL | Status: DC
  Administered 2023-07-30 – 2023-08-04 (×6): 64 mg via ORAL
  Filled 2023-07-30 (×6): qty 1

## 2023-07-30 MED ORDER — SENNOSIDES-DOCUSATE SODIUM 8.6-50 MG PO TABS
1.0000 | ORAL_TABLET | Freq: Every evening | ORAL | Status: DC | PRN
  Administered 2023-07-30: 1 via ORAL
  Filled 2023-07-30: qty 1

## 2023-07-30 MED ORDER — INSULIN ASPART 100 UNIT/ML IJ SOLN: 0.0000 [IU] | Freq: Every day | INTRAMUSCULAR | Status: DC

## 2023-07-30 MED ORDER — FUROSEMIDE 20 MG PO TABS
40.0000 mg | ORAL_TABLET | Freq: Once | ORAL | Status: AC
  Administered 2023-07-30: 40 mg via ORAL
  Filled 2023-07-30: qty 2

## 2023-07-30 NOTE — Progress Notes (Signed)
MEWS Progress Note  Patient Details Name: JAMARIO ELBAZ MRN: [id number] DOB: 1971-02-12 Today's Date: 07/30/2023   MEWS Flowsheet Documentation:  Assess: MEWS Score Temp: 97.6 F (36.4 C) BP: (!) 127/100 MAP (mmHg): 109 Pulse Rate: (!) 130 ECG Heart Rate: (!) 131 Resp: 17 Level of Consciousness: Alert SpO2: 100 % O2 Device: Room Air Patient Activity (if Appropriate): In bed Assess: MEWS Score MEWS Temp: 0 MEWS Systolic: 0 MEWS Pulse: 3 MEWS RR: 0 MEWS LOC: 0 MEWS Score: 3 MEWS Score Color: Yellow Assess: SIRS CRITERIA SIRS Temperature : 0 SIRS Respirations : 0 SIRS Pulse: 1 SIRS WBC: 0 SIRS Score Sum : 1 Assess: if the MEWS score is Yellow or Red Were vital signs accurate and taken at a resting state?: Yes Does the patient meet 2 or more of the SIRS criteria?: No MEWS guidelines implemented : Yes, yellow Treat MEWS Interventions: Considered administering scheduled or prn medications/treatments as ordered Take Vital Signs Increase Vital Sign Frequency : Yellow: Q2hr x1, continue Q4hrs until patient remains green for 12hrs Escalate MEWS: Escalate: Yellow: Discuss with charge nurse and consider notifying provider and/or RRT Provider Notification Provider Name/Title: Mickie Bail, MD Date Provider Notified: 07/30/23 Time Provider Notified: [id number] Method of Notification: Page Notification Reason: Other (Comment) (Pts HR sustaining in the 130s even when asleep, rhythm is ST.)      Eriona Kinchen Blanche C Mikeala Girdler 07/30/2023, 11:59 PM

## 2023-07-30 NOTE — ED Notes (Signed)
2 unsuccessful urine caths, patients scrotum is too swollen to have any discernable landmarks, doctor made aware. Possible consult with urology.

## 2023-07-30 NOTE — Consult Note (Signed)
WOC Nurse Consult Note: Reason for Consult: bilateral LE wounds. Patient is followed by Dr. Lajoyce Corners outpatient, transmet amputation on the right foot 02/25/21. Left great toe amputation 01/25/23 Heel ulceration; left heel Admitted for swelling of the LEs and abdomen as well as SOB Wound type: PAD Pressure Injury POA: NA Measurement: see nursing flow sheet Wound bed: both wounds are fairly clean, mild fibrinous material in the heel wound. They area however pale and non granular.  Drainage (amount, consistency, odor) see nursing notes  Periwound:intact, edema Dressing procedure/placement/frequency: Cleanse both wounds with Monte Fantasia [place] # [phone]). Cut to fit silver hydrofiber [place] # [id number]). Top with gauze. Change daily  Consider consultation with Dr. Lajoyce Corners while inpatient.     Re consult if needed, will not follow at this time. Thanks  Amadu Schlageter [employer], RN,CWOCN, CNS, CWON-AP [phone])

## 2023-07-30 NOTE — ED Notes (Signed)
Holding furosemide for urology consult for foley placement, previous RN unable to place foley and provider is aware.

## 2023-07-30 NOTE — Progress Notes (Signed)
ED Pharmacy Antibiotic Sign Off An antibiotic consult was received from an ED provider for cefepime and vancomycin per pharmacy dosing for sepsis. A chart review was completed to assess appropriateness.   The following one time order(s) were placed:  Cefepime 2g  Vancomycin 2g   Further antibiotic and/or antibiotic pharmacy consults should be ordered by the admitting provider if indicated.   Thank you for allowing pharmacy to be a part of this patient's care.   Marja Kays, [facility]  Clinical Pharmacist 07/30/23 2:35 AM

## 2023-07-30 NOTE — ED Triage Notes (Signed)
Pt arrived with brother d/t SOB - has CHF - has abdominal swelling as well as testicular swelling 5x the normal size he states.

## 2023-07-30 NOTE — H&P (Addendum)
Date: 07/30/2023               Patient Name:  Dakota Ramirez MRN: [id number]  DOB: September 19, 1970 Age / Sex: 52 y.o., male   PCP: Annett Fabian, MD         Medical Service: Internal Medicine Teaching Service         Attending Physician: Dr. Ginnie Smart, MD      First Contact: Dr. Katheran James, DO Pager [phone]    Second Contact: Dr. Olegario Messier, MD Pager [phone]         After Hours (After 5p/  First Contact Pager: [phone]  weekends / holidays): Second Contact Pager: [phone]   SUBJECTIVE   Chief Complaint: "Abdominal and leg swelling and SOB"  History of Present Illness:  This is a 52 year old male with the history of chronic decompensated sytolic heart failure with EF  <20%, HTN and uncontrolled T2DM, chronic osteomyelitis, chronic wounds who presented to the ED due to concerns for leg swelling associated shortness of breath.   Of note,this patient was admitted and treated for septic shock secondary to osteomyelitis of the foot on 06/26/2023,requiring ICU admission. Patient left AMA on 11/18. Since, he followed up with the advance heart failure clinic twice. During the initial visit on 07/15/2023, provider recommended patient to be admitted to hospital for HF exacerbation as he was not fully diuresed prior to leaving AMA. Patient declined; team attempted to manage outpatient. After follow up on 12/11, patient remained grossly volume overloaded and admission was suggested again, which was declined by patient. Furoscix 80 mg (furosemide through infusor) BID were ordered for 3 days, but patient only completed 1. Has not been seen in clinic since.   During his last hospitalization, patient agreed to palliative consult for  for symptom management approach but he left AMA   Patients says he has not been taking his medication after he left the clinic. He says his abdomen has gotten bigger since and he feel short of breath with minimal exertion. He endorses pain in both feet but  denies any fever or chills during this period. He denies chest pain, changes in vision, nausea, vomiting, GI symptoms. Urine appears dark but no purulence. His scrotum and legs are swollen. His feet and wounds hurt when he walk but this has not changes.    Review of Systems: A complete ROS was negative except as per HPI.    In the ED, patient was given one dose of lasix 40 mg and started on metronidazole and vancomycin and IMTS was consulted  for admission.    Past Medical History As per HPI  Meds:  Per chat review ( as patient is unsure what he takes )    Current Outpatient Medications on File Prior to Encounter  Medication Sig Dispense Refill   carvedilol (COREG) 6.25 MG tablet Take 1 tablet (6.25 mg total) by mouth 2 (two) times daily. 180 tablet 0               metolazone (ZAROXOLYN) 5 MG tablet Take 1 tablet (5 mg total) by mouth as directed. 5 tablet 3   NOVOLOG MIX 70/30 FLEXPEN (70-30) 100 UNIT/ML FlexPen Inject 30 Units into the skin 2 (two) times daily with a meal. 15 mL 3   potassium chloride SA (KLOR-CON M) 20 MEQ tablet Take 2 tablets (40 mEq total) by mouth daily. Take extra 40 meq on day you take metolazone. Take extra 40 mg on days you use  Furoxcix. 120 tablet 5   spironolactone (ALDACTONE) 25 MG tablet Take 1 tablet (25 mg total) by mouth daily. 90 tablet 3   torsemide (DEMADEX) 20 MG tablet Take 4 tablets (80 mg total) by mouth 2 (two) times daily. 240 tablet 5        Past Medical History:  Diagnosis Date   CHF (congestive heart failure) (HCC)    Diabetes mellitus without complication (HCC)    Type II   Heart failure with reduced ejection fraction (HCC) 11/26/2021   Hypertension    Sepsis with encephalopathy and septic shock (HCC) 06/26/2023   Shock (HCC) 06/26/2023     Current Meds  Medication Sig   NOVOLOG MIX 70/30 FLEXPEN (70-30) 100 UNIT/ML FlexPen Inject 30 Units into the skin 2 (two) times daily with a meal.   potassium chloride SA (KLOR-CON M)  20 MEQ tablet Take 2 tablets (40 mEq total) by mouth daily. Take extra 40 meq on day you take metolazone. Take extra 40 mg on days you use Furoxcix.   spironolactone (ALDACTONE) 25 MG tablet Take 1 tablet (25 mg total) by mouth daily.    Allergies: Allergies as of 07/29/2023   (No Known Allergies)    Past Surgical History:  Procedure Laterality Date   AMPUTATION Left 01/25/2023   Procedure: LEFT GREAT TOE AMPUTATION AND DEBRIDEMENT OF HEEL;  Surgeon: Nadara Mustard, MD;  Location: [facility] OR;  Service: Orthopedics;  Laterality: Left;   BACK SURGERY     CHOLECYSTECTOMY     RIGHT/LEFT HEART CATH AND CORONARY ANGIOGRAPHY N/A 11/30/2021   Procedure: RIGHT/LEFT HEART CATH AND CORONARY ANGIOGRAPHY;  Surgeon: Dolores Patty, MD;  Location: [facility] INVASIVE CV LAB;  Service: Cardiovascular;  Laterality: N/A;   TOE AMPUTATION Right    all toes on RT foot   TRANSESOPHAGEAL ECHOCARDIOGRAM (CATH LAB) N/A 07/01/2023   Procedure: TRANSESOPHAGEAL ECHOCARDIOGRAM;  Surgeon: Romie Minus, MD;  Location: [facility] INVASIVE CV LAB;  Service: Cardiovascular;  Laterality: N/A;     Family History: Unknown    Social:  Lives With: his cousin Occupation: Unemployed, now on [employer] Support: Limited support by self  Level of Function: Independent of all ADLS/iADLS , though mentions that he falls frequently because balance problems after amputation Substances: Denies alcohol, tobacco and all illicit drugs  Production manager: Self  Code Status: FULL   OBJECTIVE:   Physical Exam: Blood pressure 97/81, pulse (!) 125, temperature (!) 97.3 F (36.3 C), temperature source Oral, resp. rate (!) 22, height 5\' 11"  (1.803 m), weight (!) 139.8 kg, SpO2 100%.  Constitutional: Obese appearing man, laying in bed,Midly agitated HEENT: Poor dentition  Cardiovascular: Swelling in the ankles , legs, abdomen and scrotum  Pulmonary/Chest:  Crackles appreciated  Abdominal: soft, non-tender, non-distended MSK: normal  bulk and tone  L foot dorsal surface   L foot plantar surface with calcaneal wound   R foot w transmetatarsal wound   R foot   Neurological: alert & oriented x 3, 5/5 strength in bilateral upper and lower extremities, normal gait Skin: Tensed edema  GU: Swollen scrotum . Mild erythema at right groin skin folds.   Psych: Mildly agitated   Labs: CBC    Latest Ref Rng & Units 07/30/2023   12:44 AM 07/01/2023    4:30 AM 06/30/2023    3:24 AM  CBC  WBC 4.0 - 10.5 K/uL 8.8  11.5  10.4   Hemoglobin 13.0 - 17.0 g/dL 40.3  47.4  25.9   Hematocrit 39.0 -  52.0 % 40.7  40.7  39.9   Platelets 150 - 400 K/uL 256  163  181       CMP     Latest Ref Rng & Units 07/30/2023    4:24 AM 07/30/2023    2:53 AM 07/30/2023   12:44 AM  CMP  Glucose 70 - 99 mg/dL   213   BUN 6 - 20 mg/dL   25   Creatinine 0.86 - 1.24 mg/dL 5.78   4.69   Sodium 629 - 145 mmol/L   133   Potassium 3.5 - 5.1 mmol/L   4.6   Chloride 98 - 111 mmol/L   97   CO2 22 - 32 mmol/L   26   Calcium 8.9 - 10.3 mg/dL   8.3   Total Protein 6.5 - 8.1 g/dL  8.1    Total Bilirubin <1.2 mg/dL  0.9    Alkaline Phos 38 - 126 U/L  177    AST 15 - 41 U/L  31    ALT 0 - 44 U/L  20      Imaging: DG CHEST PORT 1 VIEW Result Date: 07/30/2023 CLINICAL DATA:  Dyspnea EXAM: PORTABLE CHEST 1 VIEW COMPARISON:  06/26/2023 FINDINGS: Interval removal of left internal jugular central venous catheter. Pulmonary insufflation is stable. Opacification right costophrenic angle may be artifactual related to overlying soft tissue on this semi erect examination. Lungs are clear. No pneumothorax or pleural effusion. Cardiac size is stably, mildly enlarged. Pulmonary vascularity is normal. Multiple healed left rib fractures are noted. No acute bone abnormality. IMPRESSION: 1. Stable pulmonary insufflation. 2. Mild cardiomegaly. Electronically Signed   By: Helyn Numbers M.D.   On: 07/30/2023 03:08    ASSESSMENT & PLAN:   Assessment & Plan by  Problem: Principal Problem:   Acute on chronic systolic heart failure (HCC) Active Problems:   Acute on chronic congestive heart failure (HCC)   Dakota Ramirez is a 52 y.o. person living with a history of heart failure with reduced ejection fraction, hypertension, uncontrolled diabetes, who presented with concerns for generalized body swelling and shortness of breath and admitted for acute on chronic heart failure.  #Acute on Chronic Systolic Heart Failure  Hx of CHF with reduced EF ( Echo 11/24: EF 20-25% ). Presented with sign and symptoms of HF exacerbation, volume overloaded on exam, weight increase from 257 lb at discharge on 11/18 to 308lb today. BNP of 2303 from 1670 a week ago. This exacerbation  could be due to  medication non compliance vs infections vs renal failure. No signs of ischemia on EKG or troponin elevation. Normal renal function. Patient had elevated lactic acid of 3.1 however, no leukocytosis and no fever at this time. Currently undergoing infectious work up (see below).   Given prior admission history,  this current exacerbation is likely in the setting of medication non-adherence due to poor insight into medical condition despite follow up with specialists. Of concern is his need for large volume diuresis at this time and potential for low output state. Discussed with on-call Cardiology fellow; appreciated his guidance with this mixed picture. At this time, patient it warm and wet; will start on IV lasix with close monitoring of lactic acid and renal function. Should the patient's blood pressure decline or physical exam findings indicate further hypoperfusion, he may need PICC like for COOX monitoring vs ICU admission.   Of note, this patient has left AMA in the past despite high risk decompensated chronic medical conditions.  He remains Full code with poor insight into his disease process. While he remains in the hospital, it would be sensible to start relationship with Palliative  care service. Perhaps, patient would be amenable to symptom-based treatment vs in-hospital care. - Cardiology recs appreciated   - IV lasix 120 mg once - Restart spironolactone - Follow up TTE - Follow up 1PM CMP and Mg - Replete Mg if <2 and K if <4 - Hold beta blockade  - Daily weights - Ins and outs; ensure foley catheter is placed - Consider consult to Palliative  #Sepsis rule out #Worsening chronic wound infection  #Chronic Osteomyelitis #Inguinal fold intertrigo Chronic bilateral foot wounds , Please see above for images. Patient is to be on chronic antibiotics for chronic osteomyelitis; reports non adherence. There is interval worsening on L calcaneal wound and overlying tissue erythema on bilateral foot surfaces, concerning for overlying cellulitis. Orthopedics was consulted during 06/2023 admission while patient was in the ICU; no surgical indication indication at that time. BP on admission 97/81 with MAPs of 86. Vitals improved with a BP of 124/96 with MAP of 106 but remains tachypneic at 23 and tachycardic at 126. Likely in setting of acute on CHF, however, given worsening of chronic wounds, will treat at below.  - F/U blood cultures  - FU U/A  - Continue Flagyl, Cefepime and Vancomycin  - Follow up final read  XR bilateral feet - Consider re-consulting orthopedics for possible debridement  - Continue IV Abx - Nystatin powder BID for intertrigo  #Uncontrolled diabetes  Recent A1c 06/26/2023 was 10.3 , on home Novolog 30 units BID. Unclear if consistently on this medication. Blood glucose on this admission is 281.  - Will start 30 units Semglee and start SSI  - CGM   #Hypertension BP is 124/96 ,on home Coreg 6.25 mg.Will hold it for now in the setting of HF exacerbation - Continue BP monitoring   - Continue Spirolactone   #Scrotal swelling Likely in the setting of anasarca - Scrotal sling, ordered - Follow up final read US scrotal - Ordered foley catheter; if unable  to catheterize, consider consult to urology for foley placement  #Hypoalbuminemia #Protein-calorie malnutrition -RD consult during this admission  Diet: NPO VTE: Enoxaparin IVF: None,None Code: Full  Prior to Admission Living Arrangement: Home, living cousin Anticipated Discharge Location: Home Barriers to Discharge: medical work up and treatment  Dispo: Admit patient to Observation with expected length of stay less than 2 midnights.  Signed: Kathleen Lime, MD Internal Medicine Resident PGY-1  07/30/2023, 5:07 AM

## 2023-07-30 NOTE — Progress Notes (Signed)
Occupational Therapy Evaluation Patient Details Name: Dakota Ramirez MRN: [id number] DOB: Feb 16, 1971 Today's Date: 07/30/2023   History of Present Illness 52 y.o. male presents to [facility] hospital on 07/30/2023 with LE swelling and SOB. Pt recently admitted 11/13 for septic shock 2/2 osteomyelitis of foot, left AMA on 11/18. Pt admitted for management of acute on chronic systolic heart failure. 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   Clinical Impression   Pt currently at mod assist level overall for LB selfcare with min assist for sit to stand and simulated toilet transfers.  Pt demonstratesincreased swelling, pain, and decreased flexibility overall. Increased HR throughout session with 123 BPM at rest increasing up to 127 with standing.  Pt overall uncomfortable secondary to fluid retention.  Fabricated scrotal sling to help support testicular swelling and will continue to assess effectiveness.  Prior to admission, pt lived behind his father's house in a camper.  He was independent/modified independent per his report but has history of recent falls.  Feel he will benefit from acute care OT at this time to help progress ADL independence to a level that will be safe for discharge home.  Uncertain if his father can provide any assist at this time.  Recommend follow-up HHOT if patient progresses as expected after fluid is removed.          If plan is discharge home, recommend the following: A little help with walking and/or transfers;A little help with bathing/dressing/bathroom;Assist for transportation    Functional Status Assessment  Patient has had a recent decline in their functional status and demonstrates the ability to make significant improvements in function in a reasonable and predictable amount of time.  Equipment Recommendations  None recommended by OT       Precautions / Restrictions  Precautions Precautions: Fall Precaution Comments: significant BLE and abdominal edema, bilateral foot wounds, RLE transmet, Restrictions Weight Bearing Restrictions Per Provider Order: No      Mobility Bed Mobility Overal bed mobility: Needs Assistance Bed Mobility: Supine to Sit, Sit to Supine     Supine to sit: Mod assist Sit to supine: Min assist        Transfers Overall transfer level: Needs assistance Equipment used: None Transfers: Sit to/from Stand Sit to Stand: Min assist                  Balance Overall balance assessment: Needs assistance Sitting-balance support: No upper extremity supported, Feet supported Sitting balance-Leahy Scale: Fair     Standing balance support: During functional activity, Bilateral upper extremity supported, Single extremity supported Standing balance-Leahy Scale: Fair Standing balance comment: Pt with hand on therapist while standing and taking steps                           ADL either performed or assessed with clinical judgement   ADL Overall ADL's : Needs assistance/impaired Eating/Feeding: Independent;Sitting   Grooming: Wash/dry hands;Wash/dry face;Set up;Sitting   Upper Body Bathing: Supervision/ safety;Sitting   Lower Body Bathing: Moderate assistance;Sit to/from stand   Upper Body Dressing : Supervision/safety;Sitting   Lower Body Dressing: Maximal assistance;Sit to/from stand   Toilet Transfer: Moderate assistance;Stand-pivot   Toileting- Clothing Manipulation and Hygiene: Sit to/from stand;Minimal assistance       Functional mobility during ADLs: Minimal assistance (take a couple steps up the EOB) General ADL Comments: Pt with increased discomfort in  scrotum, back and abdomen secondary to swelling.  Slight increased agitation with therapist trying to get him to move, which he reports that he can't because he has too much pain.  Eventually, had him complete supine to sit EOB with mod assist  from slightly elevated surface.  Provided fabrication of scrotal sling for support to help relieve pain, however pt may not tolerate secondary to strapping around abdomen or chest to help support.  HR elevated throughout session in the 123-127 BPM range.  Pt with increased difficulty breathing but O2 sats greater than 95%.     Vision Baseline Vision/History: 0 No visual deficits Ability to See in Adequate Light: 0 Adequate Patient Visual Report: No change from baseline Additional Comments: Vision not formally assessed, pt with eyes closed frequently secondary to pain and discomfort.     Perception Perception: Not tested       Praxis Praxis: Not tested       Pertinent Vitals/Pain Pain Assessment Pain Assessment: Faces Faces Pain Scale: Hurts little more Pain Location: right ribs and back pain     Extremity/Trunk Assessment Upper Extremity Assessment Upper Extremity Assessment: Overall WFL for tasks assessed (right upper rib pain, so did not test shoulder strength but pt with no history of shoulder problems.)   Lower Extremity Assessment Lower Extremity Assessment: Defer to PT evaluation   Cervical / Trunk Assessment Cervical / Trunk Assessment: Normal   Communication Communication Communication: Difficulty communicating thoughts/reduced clarity of speech Cueing Techniques: Verbal cues   Cognition Arousal: Alert Behavior During Therapy: Agitated Overall Cognitive Status: No family/caregiver present to determine baseline cognitive functioning                                 General Comments: Pt slightly agitated, decreased awareness of day of the week.  States balance deficits and inability to move secondary to being swollen.     General Comments  pt is tachycardic throughout session from 125-130 at rest and with mobility. SpO2 stable at 100% on room air            Home Living Family/patient expects to be discharged to:: Other (Comment) (camper) Living  Arrangements: Parent (pt's camper is directly behind his dad's home) Available Help at Discharge: Family;Available PRN/intermittently   Home Access: Level entry           Bathroom Shower/Tub: Sponge bathes at baseline   Bathroom Toilet: Standard     Home Equipment: Cane - single point   Additional Comments: Pt lives in camper without stairs, no water, sponge bathes, pees outside and BM in a bucket.      Prior Functioning/Environment Prior Level of Function : Independent/Modified Independent             Mobility Comments: independent no AD, May use SPC when exacerbation of SOB and fluid. Pt does report 3 recent falls since last admission ADLs Comments: pt reports independence in bADLs/iADLs. sponge bathes and typically has slip on shoes        OT Problem List: Decreased strength;Decreased knowledge of use of DME or AE;Decreased activity tolerance;Impaired balance (sitting and/or standing);Pain;Decreased safety awareness      OT Treatment/Interventions: Self-care/ADL training;Balance training;Therapeutic activities;DME and/or AE instruction;Patient/family education;Neuromuscular education    OT Goals(Current goals can be found in the care plan section) Acute Rehab OT Goals Patient Stated Goal: Pt wants the swelling to go down so he can move without pain. OT Goal Formulation:  With patient Time For Goal Achievement: 08/13/23 Potential to Achieve Goals: Good  OT Frequency: Min 1X/week       AM-PAC OT "6 Clicks" Daily Activity     Outcome Measure Help from another person eating meals?: None Help from another person taking care of personal grooming?: A Little Help from another person toileting, which includes using toliet, bedpan, or urinal?: A Little Help from another person bathing (including washing, rinsing, drying)?: A Lot Help from another person to put on and taking off regular upper body clothing?: A Little Help from another person to put on and taking off  regular lower body clothing?: A Lot 6 Click Score: 17   End of Session Nurse Communication: Mobility status  Activity Tolerance: Patient limited by pain Patient left: in bed;with call bell/phone within reach  OT Visit Diagnosis: Unsteadiness on feet (R26.81);Other abnormalities of gait and mobility (R26.89);Repeated falls (R29.6);Muscle weakness (generalized) (M62.81);Pain Pain - Right/Left:  (back pain)                Time: 2725-3664 OT Time Calculation (min): 44 min Charges:  OT General Charges $OT Visit: 1 Visit OT Evaluation $OT Eval Moderate Complexity: 1 Mod OT Treatments $Self Care/Home Management : 23-37 mins  Perrin Maltese, OTR/L Acute Rehabilitation Services  Office [phone] 07/30/2023

## 2023-07-30 NOTE — Progress Notes (Signed)
Heart Failure Navigator Progress Note  Assessed for Heart & Vascular TOC clinic readiness.  Patient does not meet criteria due to Advanced Heart Failure Team patient of Dr. Bensimhon.   Navigator will sign off at this time.   Lucilia Yanni, BSN, RN Heart Failure Nurse Navigator Secure Chat Only   

## 2023-07-30 NOTE — Evaluation (Signed)
Physical Therapy Evaluation Patient Details Name: Dakota Ramirez MRN: [id number] DOB: 1970-10-27 Today's Date: 07/30/2023  History of Present Illness  52 y.o. male presents to [facility] hospital on 07/30/2023 with LE swelling and SOB. Pt recently admitted 11/13 for septic shock 2/2 osteomyelitis of foot, left AMA on 11/18. Pt admitted for management of acute on chronic systolic heart failure. 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  Clinical Impression  Pt presents to PT with deficits in functional mobility, strength, power, endurance, gait, balance. Pt continues to have significant BLE pitting edema along with edema at abdomen and scrotum. Pt reports DOE with bed mobility and transfers, although O2 stats are stable on room air. Pt is also limited by generalized pain related to edema. Pt will benefit from continued frequent attempts at mobilization in an effort to improve activity tolerance and reduce falls risk. PT will plan to further assess gait with support of rollator in future sessions. PT is hopeful the pt will progress well with further diuresis.         If plan is discharge home, recommend the following: A little help with walking and/or transfers;A little help with bathing/dressing/bathroom;Assistance with cooking/housework;Assist for transportation;Direct supervision/assist for medications management;Direct supervision/assist for financial management   Can travel by private vehicle        Equipment Recommendations Rollator (4 wheels) (pending assessment with device)  Recommendations for Other Services       Functional Status Assessment Patient has had a recent decline in their functional status and demonstrates the ability to make significant improvements in function in a reasonable and predictable amount of time.     Precautions / Restrictions Precautions Precautions:  Fall Precaution Comments: significant BLE and abdominal edema, bilateral foot wounds Restrictions Weight Bearing Restrictions Per Provider Order: No      Mobility  Bed Mobility Overal bed mobility: Needs Assistance Bed Mobility: Supine to Sit, Sit to Supine     Supine to sit: Min assist, HOB elevated Sit to supine: Min assist, HOB elevated        Transfers Overall transfer level: Needs assistance Equipment used: None Transfers: Sit to/from Stand Sit to Stand: Contact guard assist                Ambulation/Gait             Pre-gait activities: pt takes one step forward at edge of bed, then one step backward. Defers further ambulation due to fatigue    Stairs            Wheelchair Mobility     Tilt Bed    Modified Rankin (Stroke Patients Only)       Balance Overall balance assessment: Needs assistance Sitting-balance support: No upper extremity supported, Feet supported Sitting balance-Leahy Scale: Fair     Standing balance support: No upper extremity supported, During functional activity Standing balance-Leahy Scale: Fair                               Pertinent Vitals/Pain Pain Assessment Pain Assessment: Faces Faces Pain Scale: Hurts even more Pain Location: ribs Pain Descriptors / Indicators: Aching Pain Intervention(s): Monitored during session    Home Living Family/patient expects to be discharged to:: Other (Comment) (camper) Living Arrangements: Parent (pt's camper is directly behind his dad's home) Available Help at Discharge: Family;Available PRN/intermittently   Home  Access: Level entry         Home Equipment: Cane - single point Additional Comments: Pt lives in camper without stairs, no water, sponge bathes, pees outside and BM in a bucket.    Prior Function Prior Level of Function : Independent/Modified Independent             Mobility Comments: independent no AD, May use SPC when exacerbation of  SOB and fluid. Pt does report 3 recent falls since last admission ADLs Comments: pt reports independence in bADLs/iADLs. sponge bathes and typically has slip on shoes     Extremity/Trunk Assessment   Upper Extremity Assessment Upper Extremity Assessment: Defer to OT evaluation    Lower Extremity Assessment Lower Extremity Assessment: Generalized weakness (BLE pitting edema, bilateral foot wounds)    Cervical / Trunk Assessment Cervical / Trunk Assessment: Normal  Communication   Communication Communication: Difficulty communicating thoughts/reduced clarity of speech Cueing Techniques: Verbal cues  Cognition Arousal: Alert Behavior During Therapy: Agitated Overall Cognitive Status: No family/caregiver present to determine baseline cognitive functioning                                 General Comments: decreased insight into medical situation        General Comments General comments (skin integrity, edema, etc.): pt is tachycardic throughout session from 125-130 at rest and with mobility. SpO2 stable at 100% on room air    Exercises     Assessment/Plan    PT Assessment Patient needs continued PT services  PT Problem List Decreased activity tolerance;Decreased strength;Decreased balance;Decreased mobility;Decreased knowledge of use of DME;Decreased safety awareness;Decreased knowledge of precautions;Cardiopulmonary status limiting activity       PT Treatment Interventions DME instruction;Gait training;Stair training;Functional mobility training;Therapeutic activities;Therapeutic exercise;Balance training;Neuromuscular re-education;Patient/family education    PT Goals (Current goals can be found in the Care Plan section)  Acute Rehab PT Goals Patient Stated Goal: to reduce swelling and improve activity tolerance PT Goal Formulation: With patient Time For Goal Achievement: 08/13/23 Potential to Achieve Goals: Fair    Frequency Min 1X/week      Co-evaluation               AM-PAC PT "6 Clicks" Mobility  Outcome Measure Help needed turning from your back to your side while in a flat bed without using bedrails?: A Little Help needed moving from lying on your back to sitting on the side of a flat bed without using bedrails?: A Little Help needed moving to and from a bed to a chair (including a wheelchair)?: A Little Help needed standing up from a chair using your arms (e.g., wheelchair or bedside chair)?: A Little Help needed to walk in hospital room?: Total Help needed climbing 3-5 steps with a railing? : Total 6 Click Score: 14    End of Session   Activity Tolerance: Patient limited by fatigue Patient left: in bed;with call bell/phone within reach Nurse Communication: Mobility status PT Visit Diagnosis: Other abnormalities of gait and mobility (R26.89);Muscle weakness (generalized) (M62.81)    Time: 6301-6010 PT Time Calculation (min) (ACUTE ONLY): 18 min   Charges:   PT Evaluation $PT Eval Low Complexity: 1 Low   PT General Charges $$ ACUTE PT VISIT: 1 Visit         Arlyss Gandy, PT, DPT Acute Rehabilitation Office [phone]   Arlyss Gandy 07/30/2023, 11:35 AM

## 2023-07-30 NOTE — Sepsis Progress Note (Signed)
Following for sepsis monitoring ?

## 2023-07-30 NOTE — ED Provider Notes (Signed)
[facility] Emergency Department Provider Note MRN:  [id number]  Arrival date & time: 07/30/23     Chief Complaint   Shortness of Breath   History of Present Illness   Dakota Ramirez is a 52 y.o. year-old male presents to the ED with chief complaint of swelling.  He states that his abdomen and legs are swelling.  Reports some associated SOB.  States that he has had swelling of his scrotum.  Was admitted about a month ago for sepsis and cardiogenic shock.  Patient states that he is willing to stay in the hospital this time.  Follows with the heart failure clinic and was advised admission.    History provided by patient.   Review of Systems  Pertinent positive and negative review of systems noted in HPI.    Physical Exam   Vitals:   07/30/23 0230 07/30/23 0300  BP: (!) 124/96   Pulse: (!) 126 (!) 125  Resp: (!) 23   Temp:    SpO2: 98% 100%    CONSTITUTIONAL:  chronically ill-appearing NEURO:  Alert and oriented x 3, CN 3-12 grossly intact EYES:  eyes equal and reactive ENT/NECK:  Supple, no stridor  CARDIO:  tachycardic, regular rhythm, appears well-perfused  PULM:  No respiratory distress,  GI/GU:  Fluid distended abdomen MSK/SPINE:  Right transmetatarsal amputation, erythema, ulceration, left foot erythema and cellulitis. Pitting edema is present SKIN:  cellulitis of feet   *Additional and/or pertinent findings included in MDM below  Diagnostic and Interventional Summary    EKG Interpretation Date/Time:  Tuesday July 30 2023 00:22:35 EST Ventricular Rate:  125 PR Interval:  164 QRS Duration:  94 QT Interval:  310 QTC Calculation: 447 R Axis:   71  Text Interpretation: Sinus tachycardia with Fusion complexes Low voltage QRS Cannot rule out Anterior infarct , age undetermined Abnormal ECG When compared with ECG of 15-Jul-2023 16:26, PREVIOUS ECG IS PRESENT Confirmed by Zadie Rhine ([id number]) on 07/30/2023 2:18:13 AM       Labs  Reviewed  BASIC METABOLIC PANEL - Abnormal; Notable for the following components:      Result Value   Sodium 133 (*)    Chloride 97 (*)    Glucose, Bld 281 (*)    BUN 25 (*)    Calcium 8.3 (*)    All other components within normal limits  CBC - Abnormal; Notable for the following components:   Hemoglobin 12.6 (*)    RDW 17.8 (*)    nRBC 0.5 (*)    All other components within normal limits  BRAIN NATRIURETIC PEPTIDE - Abnormal; Notable for the following components:   B Natriuretic Peptide 2,303.1 (*)    All other components within normal limits  I-STAT CG4 LACTIC ACID, ED - Abnormal; Notable for the following components:   Lactic Acid, Venous 3.1 (*)    All other components within normal limits  CULTURE, BLOOD (ROUTINE X 2)  CULTURE, BLOOD (ROUTINE X 2)  URINALYSIS, ROUTINE W REFLEX MICROSCOPIC  HEPATIC FUNCTION PANEL  I-STAT CG4 LACTIC ACID, ED  TROPONIN I (HIGH SENSITIVITY)  TROPONIN I (HIGH SENSITIVITY)    US SCROTUM W/DOPPLER    (Results Pending)  DG CHEST PORT 1 VIEW    (Results Pending)  DG Foot Complete Right    (Results Pending)  DG Foot Complete Left    (Results Pending)    Medications  sodium chloride flush (NS) 0.9 % injection 10 mL (has no administration in time range)  sodium chloride  0.9 % bolus 500 mL (has no administration in time range)  ceFEPIme (MAXIPIME) 2 g in sodium chloride 0.9 % 100 mL IVPB (has no administration in time range)  metroNIDAZOLE (FLAGYL) IVPB 500 mg (has no administration in time range)  vancomycin (VANCOCIN) IVPB 1000 mg/200 mL premix (has no administration in time range)    Followed by  vancomycin (VANCOCIN) IVPB 1000 mg/200 mL premix (has no administration in time range)  furosemide (LASIX) tablet 40 mg (40 mg Oral Given 07/30/23 0128)     Procedures  /  Critical Care .Critical Care  Performed by: Roxy Horseman, PA-C Authorized by: Roxy Horseman, PA-C   Critical care provider statement:    Critical care time (minutes):   38   Critical care was necessary to treat or prevent imminent or life-threatening deterioration of the following conditions:  Sepsis and cardiac failure   Critical care was time spent personally by me on the following activities:  Development of treatment plan with patient or surrogate, discussions with consultants, evaluation of patient's response to treatment, examination of patient, ordering and review of laboratory studies, ordering and review of radiographic studies, ordering and performing treatments and interventions, pulse oximetry, re-evaluation of patient's condition and review of old charts   ED Course and Medical Decision Making  I have reviewed the triage vital signs, the nursing notes, and pertinent available records from the EMR.  Social Determinants Affecting Complexity of Care: Patient has problems living alone.   ED Course: Clinical Course as of 07/30/23 0305  Tue Jul 30, 2023  0301 I-Stat CG4 Lactic Acid(!!) Getting IV antibiotics for sepsis.  Will start 500 NS bolus, but cautious fluids given his volume status.  Will need diuresis as well. [RB]  0302 Brain natriuretic peptide(!) Elevated BNP consistent with CHF exacerbation [RB]    Clinical Course User Index [RB] Roxy Horseman, PA-C    Medical Decision Making Patient here with recurrent heart failure exacerbation, severe volume overload, and probable sepsis from osteomyelitis of the feet.    Will need admission for severity of symptoms.  Broad spectrum ABX given.  Caution with fluids as he was not hypotensive when I saw the patient in the room and lactic was less than 4.  Will consult internal medicine for admission.   Amount and/or Complexity of Data Reviewed Labs: ordered. Decision-making details documented in ED Course. Radiology: ordered and independent interpretation performed. Decision-making details documented in ED Course.    Details: Cardiomegaly   Risk Prescription drug management.          Consultants: I consulted with the Internal Medicine Residents, who are appreciated for admitting.   Treatment and Plan: Patient's exam and diagnostic results are concerning for sepsis, CHF exacerbation.  Feel that patient will need admission to the hospital for further treatment and evaluation.  Patient seen by and discussed with attending physician, Dr. Bebe Shaggy, who agrees with plan for admission.  Final Clinical Impressions(s) / ED Diagnoses     ICD-10-CM   1. Sepsis, due to unspecified organism, unspecified whether acute organ dysfunction present (HCC)  A41.9     2. Acute on chronic congestive heart failure, unspecified heart failure type [facility])  I50.9       ED Discharge Orders     None         Discharge Instructions Discussed with and Provided to Patient:   Discharge Instructions   None      Roxy Horseman, PA-C 07/30/23 0305    Zadie Rhine, MD 07/30/23 [phone]

## 2023-07-30 NOTE — ED Provider Triage Note (Signed)
Emergency Medicine Provider Triage Evaluation Note  Dakota Ramirez , a 52 y.o. male  was evaluated in triage.  Pt complains of shortness of breath, testicular swelling, lower extremity swelling. Recently discharged from hospital, hasn't been taking lasix. Denies CP, lightheadedness, dizziness, weakness. No fevers at home.  Review of Systems  Positive:  Negative:   Physical Exam  BP 97/81 (BP Location: Left Arm)   Pulse (!) 125   Temp (!) 97.3 F (36.3 C) (Oral)   Resp (!) 22   Ht 5\' 11"  (1.803 m)   Wt (!) 139.8 kg   SpO2 100%   BMI 42.99 kg/m  Gen:   Awake, no distress   Resp:  Normal effort  MSK:   Moves extremities without difficulty  Other:    Medical Decision Making  Medically screening exam initiated at 12:53 AM.  Appropriate orders placed.  [name] was informed that the remainder of the evaluation will be completed by another provider, this initial triage assessment does not replace that evaluation, and the importance of remaining in the ED until their evaluation is complete.  Hypotensive, tachycardic. Cultures and lactic ordered.    Al Decant, PA-C 07/30/23 [phone]

## 2023-07-30 NOTE — Consult Note (Signed)
Cardiology Consultation   Patient ID: Dakota Ramirez MRN: [id number]; DOB: 30-Oct-1970  Admit date: 07/30/2023 Date of Consult: 07/30/2023  PCP:  Annett Fabian, MD   [facility] Providers Cardiologist:  None        Patient Profile:   Dakota Ramirez is a 52 y.o. 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 Ninetta Lights.  History of Present Illness:   Mr. Dakota Ramirez states that he has been progressively gaining a significant amount of weight over the last weeks.  He ultimately presented to the emergency department today due to dyspnea at rest.  Denies chest pain.  States that he has not been taking his medications for multiple days now.  States that he was compliant with the Furoscix that was obtained for him through the advanced heart failure clinic but has not been taking his other medications recently.  Also has abdominal distention/bloating.  Denies fevers or chills.  Endorses significant scrotal swelling.  Last seen in advanced heart failure clinic on 07/24/2023, see that note for additional details regarding recent medical history.  Briefly, admitted 11/2021 with new onset heart failure and EF 20-25%.  Coronary angiogram normal and RHC with elevated filling pressures and normal CO.  Diuresed 50 pounds to dry weight of 259 pounds.  CMR 11/2022 with biventricular failure.  Admission 12/26/2022 for 40 pounds volume overload with LVEF at that time < 20%.  Admission 06/2023 with mixed cardiogenic and septic shock secondary to lower extremity cellulitis/wounds +/- osteomyelitis.  Required pressor support with nor epi.  Concern for culture negative endocarditis and refused to stay in the hospital for further treatment.  Not a candidate for home  antibiotics and PICC line due to substance abuse.  07/24/2023 advanced heart failure clinic plan to arrange for Furoscix 80 mg twice daily followed by torsemide and metolazone.  In the ED, tachycardic to the 120s, afebrile, BP 120s/90s.  No leukocytosis.  NA 133, BUN 25, creatinine 1.0, albumin 1.8, AST/ALT 31/20, BNP 2303, high-sensitivity troponins 12-15, lactate 3.1.  Blood cultures obtained.  EKG with sinus tach.  CXR without focal consolidation.  Scrotal ultrasound obtained.  Past Medical History:  Diagnosis Date   CHF (congestive heart failure) (HCC)    Diabetes mellitus without complication (HCC)    Type II   Heart failure with reduced ejection fraction (HCC) 11/26/2021   Hypertension    Sepsis with encephalopathy and septic shock (HCC) 06/26/2023   Shock (HCC) 06/26/2023    Past Surgical History:  Procedure Laterality Date   AMPUTATION Left 01/25/2023   Procedure: LEFT GREAT TOE AMPUTATION AND DEBRIDEMENT OF HEEL;  Surgeon: Nadara Mustard, MD;  Location: [facility] OR;  Service: Orthopedics;  Laterality: Left;   BACK SURGERY     CHOLECYSTECTOMY     RIGHT/LEFT HEART CATH AND CORONARY ANGIOGRAPHY N/A 11/30/2021   Procedure: RIGHT/LEFT HEART CATH AND CORONARY ANGIOGRAPHY;  Surgeon: Dolores Patty, MD;  Location: [facility] INVASIVE CV LAB;  Service: Cardiovascular;  Laterality: N/A;   TOE AMPUTATION Right    all toes on RT foot   TRANSESOPHAGEAL ECHOCARDIOGRAM (CATH LAB) N/A 07/01/2023   Procedure: TRANSESOPHAGEAL ECHOCARDIOGRAM;  Surgeon: Romie Minus, MD;  Location: [facility] INVASIVE CV LAB;  Service: Cardiovascular;  Laterality: N/A;     Home Medications:  Prior to Admission medications   Medication Sig Start Date End Date Taking? Authorizing Provider  NOVOLOG MIX 70/30 FLEXPEN (70-30) 100 UNIT/ML FlexPen Inject 30 Units into the skin 2 (two) times daily with a meal. 07/01/23  Yes Rocky Morel, DO  potassium chloride SA (KLOR-CON M) 20 MEQ tablet Take 2 tablets (40 mEq total) by mouth  daily. Take extra 40 meq on day you take metolazone. Take extra 40 mg on days you use Furoxcix. 07/24/23  Yes [place], Anderson Malta, FNP  spironolactone (ALDACTONE) 25 MG tablet Take 1 tablet (25 mg total) by mouth daily. 07/09/23  Yes Andrey Farmer, PA-C  carvedilol (COREG) 6.25 MG tablet Take 1 tablet (6.25 mg total) by mouth 2 (two) times daily. 07/01/23   Rocky Morel, DO  cefadroxil (DURICEF) 500 MG capsule Take 1 capsule (500 mg total) by mouth 2 (two) times daily. 07/01/23   Rocky Morel, DO  linezolid (ZYVOX) 600 MG tablet Take 1 tablet (600 mg total) by mouth 2 (two) times daily. 07/01/23   Rocky Morel, DO  metolazone (ZAROXOLYN) 5 MG tablet Take 1 tablet (5 mg total) by mouth as directed. 07/24/23 10/22/23  Jacklynn Ganong, FNP  torsemide (DEMADEX) 20 MG tablet Take 4 tablets (80 mg total) by mouth 2 (two) times daily. Patient not taking: Reported on 07/30/2023 07/24/23   Jacklynn Ganong, FNP    Inpatient Medications: Scheduled Meds:  enoxaparin (LOVENOX) injection  40 mg Subcutaneous Q24H   nystatin cream  1 Application Topical BID   sodium chloride flush  10 mL Intravenous Q12H   Continuous Infusions:  ceFEPime (MAXIPIME) IV     furosemide     vancomycin     Followed by   vancomycin     PRN Meds: acetaminophen **OR** acetaminophen, senna-docusate  Allergies:   No Known Allergies  Social History:   Social History   Socioeconomic History   Marital status: Single    Spouse name: Not on file   Number of children: Not on file   Years of education: Not on file   Highest education level: Not on file  Occupational History   Not on file  Tobacco Use   Smoking status: Never   Smokeless tobacco: Never  Vaping Use   Vaping status: Never Used  Substance and Sexual Activity   Alcohol use: Not Currently    Comment: rare   Drug use: Never   Sexual activity: Not on file  Other Topics Concern   Not on file  Social History Narrative   Not on file    Social Drivers of Health   Financial Resource Strain: High Risk (01/10/2023)   Overall Financial Resource Strain (CARDIA)    Difficulty of Paying Living Expenses: Hard  Food Insecurity: Food Insecurity Present (12/28/2022)   Hunger Vital Sign    Worried About Radiation protection practitioner of Food in the Last Year: Often true    Ran Out of Food in the Last Year: Often true  Transportation Needs: No Transportation Needs (11/29/2021)   PRAPARE - Administrator, Civil Service (Medical): No    Lack of Transportation (Non-Medical): No  Physical Activity: Not on file  Stress: No Stress Concern Present (02/28/2021)   Received [facility], [facility] - Occupational Stress Questionnaire    Feeling of Stress : Not at all  Social Connections: Unknown (12/15/2021)   Received from [facility], [facility]   Social Network    Social  Network: Not on file  Intimate Partner Violence: Unknown (11/14/2021)   Received from [facility], [facility]   HITS    Physically Hurt: Not on file    Insult or Talk Down To: Not on file    Threaten Physical Harm: Not on file    Scream or Curse: Not on file    Family History:    Family History  Problem Relation Age of Onset   Diabetes Mother    Hypertension Mother      ROS:  Please see the history of present illness.   All other ROS reviewed and negative.     Physical Exam/Data:   Vitals:   07/30/23 0015 07/30/23 0033 07/30/23 0230 07/30/23 0300  BP: 97/81  (!) 124/96   Pulse: (!) 125  (!) 126 (!) 125  Resp: (!) 22  (!) 23   Temp: (!) 97.3 F (36.3 C)     TempSrc: Oral     SpO2: 100%  98% 100%  Weight:  (!) 139.8 kg    Height:  5\' 11"  (1.803 m)      Intake/Output Summary (Last 24 hours) at 07/30/2023 0427 Last data filed at 07/30/2023 0419 Gross per 24 hour  Intake 610 ml  Output --  Net 610 ml      07/30/2023   12:33 AM 07/24/2023    1:44 PM 07/15/2023    4:12 PM  Last 3 Weights   Weight (lbs) 308 lb 3.2 oz 308 lb 3.2 oz 285 lb 3.2 oz  Weight (kg) 139.799 kg 139.799 kg 129.366 kg     Body mass index is 42.99 kg/m.  General: Disheveled with multiple scabs/excoriations HEENT: Poor dentition Neck: PD elevated Cardiac: Tachycardic and regular without murmur Lungs: Conversational dyspnea, crackles at bases Abd: Distended abdomen though soft Ext: Anasarca Musculoskeletal: Left great toe amputation, R TKA, left heel ulcer Skin: Barky, tense edema of the lower extremities and thighs Neuro:  CNs 2-12 intact, no focal abnormalities noted Psych:  Normal affect   EKG:  The EKG was personally reviewed and demonstrates: Sinus tachycardia Telemetry:  Telemetry was personally reviewed and demonstrates: Sinus tachycardia  Relevant CV Studies: Cardiac Studies: - TEE (11/24): no evidence of vegetation.   - Echo (11/24): EF 20-25%, RV mildly reduced, new mobile density on posterior mitral valve leaflet.   - Echo (5/24): EF < 20%, LV with GHK, RV mildly reduced, LA/RA sev dilated, mild-mod MR, mild TR, trivial pericardial effusion.    - Bedside echo (9/23): EF 20-25%   - Echo (4/23): EF 20-25%, regional WMA, moderately dilated LV, RV moderately reduced, RVSP 40 mmHg, mild to moderate MR, dilated IVC with estimated RAP 15 mmHg.   - R/LHC (4/23):    The left ventricular ejection fraction is less than 25% by visual estimate.   Ao = 97/67 (85) LV = 105/25 RA = 8 RV = 37/14 PA = 35/22 (25) PCW = 20 Fick cardiac output/index = 5.9/2.4 PVR = 0.8 WU SVR = 1040 Ao sat = 97% PA sat = 66%, 67%   1. Normal coronary arteries 2. 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 Mildly elevated filling pressures with normal cardiac output   cMRI (4/24): poor quality, LVEF 17%, RVEF 22%,  appears to have RV insertion site LGE (which is a nonspecific scar finding often seen in setting of elevated pulmonary pressures) and basal septal midwall LGE (which is a scar pattern seen in NICM and associated  with worse prognosis)    Laboratory Data:  High Sensitivity Troponin:   Recent Labs  Lab 07/30/23 0044 07/30/23 0253  TROPONINIHS 12 15     Chemistry Recent Labs  Lab 07/24/23 1451 07/30/23 0044  NA 129* 133*  K 4.6 4.6  CL 97* 97*  CO2 22 26  GLUCOSE 197* 281*  BUN 35* 25*  CREATININE 2.22* 1.05  CALCIUM 7.8* 8.3*  GFRNONAA 35* >60  ANIONGAP 10 10    Recent Labs  Lab 07/24/23 1451 07/30/23 0253  PROT 8.5* 8.1  ALBUMIN 1.8* 1.8*  AST 48* 31  ALT 23 20  ALKPHOS 191* 177*  BILITOT 1.2* 0.9   Lipids No results for input(s): "CHOL", "TRIG", "HDL", "LABVLDL", "LDLCALC", "CHOLHDL" in the last 168 hours.  Hematology Recent Labs  Lab 07/30/23 0044  WBC 8.8  RBC 4.54  HGB 12.6*  HCT 40.7  MCV 89.6  MCH 27.8  MCHC 31.0  RDW 17.8*  PLT 256   Thyroid No results for input(s): "TSH", "FREET4" in the last 168 hours.  BNP Recent Labs  Lab 07/24/23 1451 07/30/23 0044  BNP 1,670.4* 2,303.1*    DDimer No results for input(s): "DDIMER" in the last 168 hours.   Radiology/Studies:  DG CHEST PORT 1 VIEW Result Date: 07/30/2023 CLINICAL DATA:  Dyspnea EXAM: PORTABLE CHEST 1 VIEW COMPARISON:  06/26/2023 FINDINGS: Interval removal of left internal jugular central venous catheter. Pulmonary insufflation is stable. Opacification right costophrenic angle may be artifactual related to overlying soft tissue on this semi erect examination. Lungs are clear. No pneumothorax or pleural effusion. Cardiac size is stably, mildly enlarged. Pulmonary vascularity is normal. Multiple healed left rib fractures are noted. No acute bone abnormality. IMPRESSION: 1. Stable pulmonary insufflation. 2. Mild cardiomegaly. Electronically Signed   By: Helyn Numbers M.D.   On: 07/30/2023 03:08     Assessment and Plan:   Acute on chronic systolic heart failure Chronic osteomyelitis/cellulitis Presenting with massive volume overload and what appears to be a 50 pound weight gain from last  documented dry weight with progressive dyspnea consistent with acute on chronic systolic heart failure.  Has diffuse anasarca consistent with right heart failure.  His creatinine and LFTs are at baseline though lactate is 3.1.  Has not been taking his diuretics or GDMT or suppressive antibiotics in the outpatient setting.  There certainly may be an infectious component to his presentation, however he is significantly intravascularly volume overloaded.  Based on outpatient notes and history, this has likely been progressive for some time now.  Agree with IV antibiotics though will also need aggressive diuresis with possible inotropic support especially in the setting of elevated lactate and sinus tachycardia on presentation. - Hold beta-blockade - Okay to continue home spironolactone with normal renal function currently - IV Lasix 120 mg - Strict ins and outs - Daily weights - Keep K>4 and Mag>2 - May require inotropic support, PICC, Co. ox pending clinical course - TTE - Agree with blood cultures - Antibiotics per primary team   Risk Assessment/Risk Scores:        New York Heart Association (NYHA) Functional Class NYHA Class III        For questions or updates, please contact [facility] Please consult [email or web address] for contact info under    Signed, Aundra Dubin, MD  07/30/2023 4:27 AM

## 2023-07-30 NOTE — Progress Notes (Signed)
HD#0 SUBJECTIVE:  Patient Summary: Dakota Ramirez is a 52 y.o. with a pertinent PMH of heart failure with reduced ejection fraction, hypertension, uncontrolled diabetes, who presented with concerns for generalized body swelling and shortness of breath and admitted for acute on chronic heart failure.   Overnight Events: None, admitted to hospital early this morning  Interim History: Pt reports hunger but no other acute concerns. Denies fever, chills, fatigue, dyspnea, pain.  OBJECTIVE:  Vital Signs: Vitals:   07/30/23 0400 07/30/23 0507 07/30/23 0634 07/30/23 0637  BP:   (!) 121/90 (!) 121/90  Pulse: (!) 127   (!) 125  Resp:    16  Temp:  (!) 97.4 F (36.3 C)    TempSrc:  Oral    SpO2: 100%   100%  Weight:      Height:       Supplemental O2: Room Air SpO2: 100 %  Filed Weights   07/30/23 0033  Weight: (!) 139.8 kg     Intake/Output Summary (Last 24 hours) at 07/30/2023 0847 Last data filed at 07/30/2023 0631 Gross per 24 hour  Intake 910 ml  Output --  Net 910 ml   Net IO Since Admission: 910 mL [07/30/23 0847]  Physical Exam: Physical Exam Constitutional:      General: He is not in acute distress.    Appearance: He is well-developed. He is obese. He is not ill-appearing.  Cardiovascular:     Rate and Rhythm: Normal rate and regular rhythm.  Pulmonary:     Effort: Pulmonary effort is normal.     Breath sounds: Normal breath sounds.  Abdominal:     Palpations: Abdomen is soft.     Tenderness: There is no abdominal tenderness. There is no guarding or rebound.     Comments: Diffuse abdominal edema  Genitourinary:    Comments: Scrotal edema, inguinal intertrigo Musculoskeletal:     Right lower leg: No tenderness. Edema present.     Left lower leg: No tenderness. Edema present.     Comments: Nonpitting, barky edema  Skin:    General: Skin is warm and dry.     Comments: Chronic wounds - L heel and R transmetatarsal amputation. Appear enflamed but no active  purulence.  Neurological:     General: No focal deficit present.     Mental Status: He is alert and oriented to person, place, and time.  Psychiatric:        Mood and Affect: Mood normal.        Behavior: Behavior normal.   R foot wound:   L foot wound:   R foot edema:   Scrotal swelling:    Patient Lines/Drains/Airways Status     Active Line/Drains/Airways     Name Placement date Placement time Site Days   Peripheral IV 07/30/23 20 G Left Forearm 07/30/23  0255  Forearm   less than 1   Wound / Incision (Open or Dehisced) 12/26/22 Diabetic ulcer Toe (Comment  which one) Anterior;Left 12/26/22  1838  Toe (Comment  which one)  216   Wound / Incision (Open or Dehisced) 12/26/22 Other (Comment) Pretibial Left;Lateral 12/26/22  1839  Pretibial  216   Wound / Incision (Open or Dehisced) 12/26/22 Other (Comment) Pretibial Right 12/26/22  1839  Pretibial  216   Wound / Incision (Open or Dehisced) 12/26/22 Other (Comment) Pretibial Proximal;Right 12/26/22  1840  Pretibial  216   Wound / Incision (Open or Dehisced) 12/26/22 Other (Comment) Heel Left 12/26/22  1841  Heel  216   Wound / Incision (Open or Dehisced) 06/26/23 Diabetic ulcer Heel Left Stage 4 ulcer 06/26/23  0330  Heel  34   Wound / Incision (Open or Dehisced) 06/26/23 Amputation Foot Anterior;Right 06/26/23  0330  Foot  34             ASSESSMENT/PLAN:  Assessment: Principal Problem:   Acute on chronic systolic heart failure (HCC) Active Problems:   Acute on chronic congestive heart failure (HCC)  Dakota Ramirez is a 52 y.o. person living with a history of heart failure with reduced ejection fraction, hypertension, uncontrolled diabetes, who presented with concerns for generalized body swelling and shortness of breath and admitted for acute on chronic heart failure.   Plan:  Acute on Chronic Systolic Heart Failure  Pt with history of HFrEF (Echo 11/24 with EF 20-25%) presenting with volume overload, approximately  50lbs over dry weight, but only subjective dyspnea. Most likely cause is medication noncompliance - he follows with advanced heart failure team and received IV lasix from them as outpatient but did not take home medicine in between visits. Possible infectious etiology as well as patient is code sepsis with lactic acidosis and chronic foot wounds. Night team discussed case with on-call cardiology fellow. Presently he is perfusing well - extremities appropriately warm and mentation intact. Will pursue diuresis, but for now severe scrotal edema is preventing placement of catheter, will discuss with urology - Cardiology following, thank you - IV lasix pending placement of urinary catheter - Continue spironolactone 25 qd, hold beta blockers - TTE pending - Mg > 2 and K > 4 - Daily weights, Ins/Outs, goal weight 250lbs  Sepsis rule out Worsening chronic wound infection with chronic Osteomyelitis Inguinal fold intertrigo Code sepsis in setting of lactic acidosis and foot wounds. Not hypotensive. Mentation well. Afebrile and without leukocytosis. Will evaluate for recurrent osteomyelitis/bacteremia. Pt is meant to be on chronic antibiotics for chronic osteomyelitis, but non-adherent (Patient was given two week course of linezolid and cefadroxil at time of [facility] discharge in November). 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 He had recent septic osteomyelitis in November, but no surgical indication at that time. Intertrigo is present in inguinal folds bilaterally. - Blood cultures and UA pending - Vanc and Cefepime - MRI bilateral feet pending, will consider orthopedic help if indicated - Nystatin powder BID for inguinal intertrigo.  Anasarca, Scrotal Edema Severe scrotal edema impacting effective placement of urinary catheter. - Scrotal sling - US scrotum with possible L hydrocele - Urology to help with placement of catheter, thank you very much, rec Coude for man  over 40  Uncontrolled Type 2 Diabetes Recent A1c ws 10.3. Likely inconsistent with medicine use. Home regiment is novolog 30 BID. - Semglee 30 daily and SSI, CGM  Hypertension Pressures stable. Holding home coreg 6.25 BID. Continue home spironolactone.  Hypoalbuminemia and Protein-calorie malnutrition - consider RD consult  Best Practice: Diet: Heart with fluid restrict IVF: None VTE: enoxaparin (LOVENOX) injection 40 mg Start: 07/30/23 1000 Code: Full AB: Cefepime, Vancomycin DISPO: Anticipated discharge in 5 days to Home pending  Cardiovascular decongestion .  Signature: Katheran James, D.O.  Internal Medicine Resident, PGY-1 [facility]  Pager: [phone] 8:47 AM, 07/30/2023   Please contact the on call pager after 5 pm and on weekends at [phone].

## 2023-07-30 NOTE — ED Notes (Signed)
ED TO INPATIENT HANDOFF REPORT  ED Nurse Name and Phone #: Berna Spare [phone]  S Name/Age/Gender Dakota Ramirez 52 y.o. male Room/Bed: 006C/006C  Code Status   Code Status: Full Code  Home/SNF/Other Home Patient oriented to: self, place, and time Is this baseline? Yes   Triage Complete: Triage complete  Chief Complaint Acute on chronic heart failure (HCC) [I50.9]  Triage Note Pt arrived with brother d/t SOB - has CHF - has abdominal swelling as well as testicular swelling 5x the normal size he states.    Allergies No Known Allergies  Level of Care/Admitting Diagnosis ED Disposition     ED Disposition  Admit   Condition  --   Comment  Hospital Area: [facility] [[id number]]  Level of Care: Progressive [102]  Admit to Progressive based on following criteria: CARDIOVASCULAR & THORACIC of moderate stability with acute coronary syndrome symptoms/low risk myocardial infarction/hypertensive urgency/arrhythmias/heart failure potentially compromising stability and stable post cardiovascular intervention patients.  May admit patient to [facility] or [facility] if equivalent level of care is available:: No  Covid Evaluation: Asymptomatic - no recent exposure (last 10 days) testing not required  Diagnosis: Acute on chronic heart failure [facility]) [[id number]]  Admitting Physician: Yevette Edwards  Attending Physician: Ninetta Lights, JEFFREY C [[id number]  Certification:: I certify this patient will need inpatient services for at least 2 midnights  Expected Medical Readiness: 08/01/2023          B Medical/Surgery History Past Medical History:  Diagnosis Date   CHF (congestive heart failure) (HCC)    Diabetes mellitus without complication (HCC)    Type II   Heart failure with reduced ejection fraction (HCC) 11/26/2021   Hypertension    Sepsis with encephalopathy and septic shock (HCC) 06/26/2023   Shock (HCC) 06/26/2023   Past Surgical History:  Procedure  Laterality Date   AMPUTATION Left 01/25/2023   Procedure: LEFT GREAT TOE AMPUTATION AND DEBRIDEMENT OF HEEL;  Surgeon: Nadara Mustard, MD;  Location: [facility] OR;  Service: Orthopedics;  Laterality: Left;   BACK SURGERY     CHOLECYSTECTOMY     RIGHT/LEFT HEART CATH AND CORONARY ANGIOGRAPHY N/A 11/30/2021   Procedure: RIGHT/LEFT HEART CATH AND CORONARY ANGIOGRAPHY;  Surgeon: Dolores Patty, MD;  Location: [facility] INVASIVE CV LAB;  Service: Cardiovascular;  Laterality: N/A;   TOE AMPUTATION Right    all toes on RT foot   TRANSESOPHAGEAL ECHOCARDIOGRAM (CATH LAB) N/A 07/01/2023   Procedure: TRANSESOPHAGEAL ECHOCARDIOGRAM;  Surgeon: Romie Minus, MD;  Location: [facility] INVASIVE CV LAB;  Service: Cardiovascular;  Laterality: N/A;     A IV Location/Drains/Wounds Patient Lines/Drains/Airways Status     Active Line/Drains/Airways     Name Placement date Placement time Site Days   Peripheral IV 07/30/23 20 G Left Forearm 07/30/23  0255  Forearm   less than 1   Wound / Incision (Open or Dehisced) 12/26/22 Diabetic ulcer Toe (Comment  which one) Anterior;Left 12/26/22  1838  Toe (Comment  which one)  216   Wound / Incision (Open or Dehisced) 12/26/22 Other (Comment) Pretibial Left;Lateral 12/26/22  1839  Pretibial  216   Wound / Incision (Open or Dehisced) 12/26/22 Other (Comment) Pretibial Right 12/26/22  1839  Pretibial  216   Wound / Incision (Open or Dehisced) 12/26/22 Other (Comment) Pretibial Proximal;Right 12/26/22  1840  Pretibial  216   Wound / Incision (Open or Dehisced) 12/26/22 Other (Comment) Heel Left 12/26/22  1841  Heel  216  Wound / Incision (Open or Dehisced) 06/26/23 Diabetic ulcer Heel Left Stage 4 ulcer 06/26/23  0330  Heel  34   Wound / Incision (Open or Dehisced) 06/26/23 Amputation Foot Anterior;Right 06/26/23  0330  Foot  34            Intake/Output Last 24 hours  Intake/Output Summary (Last 24 hours) at 07/30/2023 1535 Last data filed at 07/30/2023 0631 Gross per 24  hour  Intake 910 ml  Output --  Net 910 ml    Labs/Imaging Results for orders placed or performed during the hospital encounter of 07/30/23 (from the past 48 hours)  Basic metabolic panel     Status: Abnormal   Collection Time: 07/30/23 12:44 AM  Result Value Ref Range   Sodium 133 (L) 135 - 145 mmol/L   Potassium 4.6 3.5 - 5.1 mmol/L   Chloride 97 (L) 98 - 111 mmol/L   CO2 26 22 - 32 mmol/L   Glucose, Bld 281 (H) 70 - 99 mg/dL    Comment: Glucose reference range applies only to samples taken after fasting for at least 8 hours.   BUN 25 (H) 6 - 20 mg/dL   Creatinine, Ser 6.57 0.61 - 1.24 mg/dL   Calcium 8.3 (L) 8.9 - 10.3 mg/dL   GFR, Estimated >84 >69 mL/min    Comment: (NOTE) Calculated using the CKD-EPI Creatinine Equation (2021)    Anion gap 10 5 - 15    Comment: Performed at [facility], [address] [place], [place] [address]  CBC     Status: Abnormal   Collection Time: 07/30/23 12:44 AM  Result Value Ref Range   WBC 8.8 4.0 - 10.5 K/uL   RBC 4.54 4.22 - 5.81 MIL/uL   Hemoglobin 12.6 (L) 13.0 - 17.0 g/dL   HCT 84.1 32.4 - 40.1 %   MCV 89.6 80.0 - 100.0 fL   MCH 27.8 26.0 - 34.0 pg   MCHC 31.0 30.0 - 36.0 g/dL   RDW 02.7 (H) 25.3 - 66.4 %   Platelets 256 150 - 400 K/uL   nRBC 0.5 (H) 0.0 - 0.2 %    Comment: Performed at [facility], [address], [place], [place] [address]  Brain natriuretic peptide     Status: Abnormal   Collection Time: 07/30/23 12:44 AM  Result Value Ref Range   B Natriuretic Peptide 2,303.1 (H) 0.0 - 100.0 pg/mL    Comment: Performed at [facility], [address], [place], [place] [address]  Troponin I (High Sensitivity)     Status: None   Collection Time: 07/30/23 12:44 AM  Result Value Ref Range   Troponin I (High Sensitivity) 12 <18 ng/L    Comment: (NOTE) Elevated high sensitivity troponin I (hsTnI) values and significant  changes across serial measurements may suggest ACS but many other  chronic and acute  conditions are known to elevate hsTnI results.  Refer to the "Links" section for chest pain algorithms and additional  guidance. Performed at [facility], [address] [place], [place] [address]   I-Stat CG4 Lactic Acid     Status: Abnormal   Collection Time: 07/30/23  1:00 AM  Result Value Ref Range   Lactic Acid, Venous 3.1 (HH) 0.5 - 1.9 mmol/L   Comment NOTIFIED PHYSICIAN   Urinalysis, Routine w reflex microscopic -Urine, Clean Catch     Status: Abnormal   Collection Time: 07/30/23  2:53 AM  Result Value Ref Range  Color, Urine YELLOW YELLOW   APPearance CLEAR CLEAR   Specific Gravity, Urine 1.011 1.005 - 1.030   pH 5.0 5.0 - 8.0   Glucose, UA 50 (A) NEGATIVE mg/dL   Hgb urine dipstick MODERATE (A) NEGATIVE   Bilirubin Urine NEGATIVE NEGATIVE   Ketones, ur NEGATIVE NEGATIVE mg/dL   Protein, ur 102 (A) NEGATIVE mg/dL   Nitrite NEGATIVE NEGATIVE   Leukocytes,Ua NEGATIVE NEGATIVE   RBC / HPF 0-5 0 - 5 RBC/hpf   WBC, UA 0-5 0 - 5 WBC/hpf   Bacteria, UA RARE (A) NONE SEEN   Squamous Epithelial / HPF 0-5 0 - 5 /HPF   Mucus PRESENT    Hyaline Casts, UA PRESENT    Amorphous Crystal PRESENT     Comment: Performed at [facility], [address] [place], [place] [address]  Troponin I (High Sensitivity)     Status: None   Collection Time: 07/30/23  2:53 AM  Result Value Ref Range   Troponin I (High Sensitivity) 15 <18 ng/L    Comment: (NOTE) Elevated high sensitivity troponin I (hsTnI) values and significant  changes across serial measurements may suggest ACS but many other  chronic and acute conditions are known to elevate hsTnI results.  Refer to the "Links" section for chest pain algorithms and additional  guidance. Performed at [facility], [address] [place], [place] [address]   Hepatic function panel     Status: Abnormal   Collection Time: 07/30/23  2:53 AM  Result Value Ref Range   Total Protein 8.1 6.5 - 8.1 g/dL   Albumin 1.8 (L) 3.5 -  5.0 g/dL   AST 31 15 - 41 U/L   ALT 20 0 - 44 U/L   Alkaline Phosphatase 177 (H) 38 - 126 U/L   Total Bilirubin 0.9 <1.2 mg/dL   Bilirubin, Direct 0.4 (H) 0.0 - 0.2 mg/dL   Indirect Bilirubin 0.5 0.3 - 0.9 mg/dL    Comment: Performed at [facility], [address] [place], [place] [address]  Creatinine, serum     Status: None   Collection Time: 07/30/23  4:24 AM  Result Value Ref Range   Creatinine, Ser 1.11 0.61 - 1.24 mg/dL   GFR, Estimated >95 >63 mL/min    Comment: (NOTE) Calculated using the CKD-EPI Creatinine Equation (2021) Performed at [facility], [address], [place], [place] [address]   I-Stat CG4 Lactic Acid     Status: Abnormal   Collection Time: 07/30/23  5:41 AM  Result Value Ref Range   Lactic Acid, Venous 2.5 (HH) 0.5 - 1.9 mmol/L   Comment NOTIFIED PHYSICIAN   CBG monitoring, ED     Status: Abnormal   Collection Time: 07/30/23  8:49 AM  Result Value Ref Range   Glucose-Capillary 243 (H) 70 - 99 mg/dL    Comment: Glucose reference range applies only to samples taken after fasting for at least 8 hours.  Comprehensive metabolic panel     Status: Abnormal   Collection Time: 07/30/23 12:35 PM  Result Value Ref Range   Sodium 133 (L) 135 - 145 mmol/L   Potassium 4.2 3.5 - 5.1 mmol/L   Chloride 101 98 - 111 mmol/L   CO2 25 22 - 32 mmol/L   Glucose, Bld 178 (H) 70 - 99 mg/dL    Comment: Glucose reference range applies only to samples taken after fasting for at least 8 hours.   BUN 25 (H) 6 -  20 mg/dL   Creatinine, Ser 5.40 0.61 - 1.24 mg/dL   Calcium 8.2 (L) 8.9 - 10.3 mg/dL   Total Protein 8.1 6.5 - 8.1 g/dL   Albumin 1.8 (L) 3.5 - 5.0 g/dL   AST 30 15 - 41 U/L   ALT 18 0 - 44 U/L   Alkaline Phosphatase 174 (H) 38 - 126 U/L   Total Bilirubin 1.0 <1.2 mg/dL   GFR, Estimated >98 >11 mL/min    Comment: (NOTE) Calculated using the CKD-EPI Creatinine Equation (2021)    Anion gap 7 5 - 15    Comment: Performed at [facility], [address], [place], [place] [address]  Magnesium     Status: Abnormal   Collection Time: 07/30/23 12:35 PM  Result Value Ref Range   Magnesium 1.2 (L) 1.7 - 2.4 mg/dL    Comment: Performed at [facility], [address] [place], [place] [address]  CBG monitoring, ED     Status: Abnormal   Collection Time: 07/30/23 12:35 PM  Result Value Ref Range   Glucose-Capillary 175 (H) 70 - 99 mg/dL    Comment: Glucose reference range applies only to samples taken after fasting for at least 8 hours.   Comment 1 Notify RN    Comment 2 Document in Chart   I-Stat CG4 Lactic Acid     Status: Abnormal   Collection Time: 07/30/23 12:43 PM  Result Value Ref Range   Lactic Acid, Venous 2.1 (HH) 0.5 - 1.9 mmol/L   Comment NOTIFIED PHYSICIAN    VAS [place] ABI WITH/WO TBI Result Date: 07/30/2023  LOWER EXTREMITY DOPPLER STUDY Patient Name:  Dakota Ramirez  Date of Exam:   07/30/2023 Medical Rec #: [id number]     Accession #:    [id number] Date of Birth: May 24, 1971     Patient Gender: M Patient Age:   52 years Exam Location:  [facility] Procedure:      VAS [place] ABI WITH/WO TBI Referring Phys: JEFFREY HATCHER --------------------------------------------------------------------------------  Indications: Claudication, and peripheral artery disease. High Risk Factors: Hypertension, Diabetes, past history of smoking.  Limitations: Today's exam was limited due to involuntary patient movement. Comparison Study: No significant changes seen since prior exam 12/28/22 Performing Technologist: Shona Simpson  Examination Guidelines: A complete evaluation includes at minimum, Doppler waveform signals and systolic blood pressure reading at the level of bilateral brachial, anterior tibial, and posterior tibial arteries, when vessel segments are accessible. Bilateral testing is considered an integral part of a complete examination. Photoelectric Plethysmograph (PPG) waveforms and toe systolic pressure readings are included as required  and additional duplex testing as needed. Limited examinations for reoccurring indications may be performed as noted.  ABI Findings: +--------+------------------+-----+-----------+--------+ Right   Rt Pressure (mmHg)IndexWaveform   Comment  +--------+------------------+-----+-----------+--------+ [id number]                    triphasic           +--------+------------------+-----+-----------+--------+ PTA     165               1.20 multiphasic         +--------+------------------+-----+-----------+--------+ DP      145               1.05 monophasic          +--------+------------------+-----+-----------+--------+ +--------+------------------+-----+-----------+-------+ Left    Lt Pressure (mmHg)IndexWaveform   Comment +--------+------------------+-----+-----------+-------+ [id number]  triphasic          +--------+------------------+-----+-----------+-------+ PTA     159               1.15 multiphasic        +--------+------------------+-----+-----------+-------+ DP      143               1.04 monophasic         +--------+------------------+-----+-----------+-------+ +-------+-----------+-----------+------------+------------+ ABI/TBIToday's ABIToday's TBIPrevious ABIPrevious TBI +-------+-----------+-----------+------------+------------+ Right  1.2                   1.1                      +-------+-----------+-----------+------------+------------+ Left   1.15                  1.02                     +-------+-----------+-----------+------------+------------+  Bilateral ABIs appear increased compared to prior study on 12/28/22. Unable to obtain TBI due to great toe amputation.  Summary: Right: Resting right ankle-brachial index is within normal range. Unable to obtain TBI due to multiple toe amputations. Left: Resting left ankle-brachial index is within normal range. *See table(s) above for measurements and observations.      Preliminary    DG Foot Complete Right Result Date: 07/30/2023 CLINICAL DATA:  Foot infection EXAM: RIGHT FOOT COMPLETE - 3 VIEW COMPARISON:  None Available. FINDINGS: Transmetatarsal amputation. Irregular margination of osteotomies involving the first through fourth rays. The fifth ray appears corticated and smoothly contoured. No acute fracture or dislocation. Punctate metallic density near the first metatarsal stump, likely chronic. The soft tissue stone is irregularly marginated with possible fissuring or ulceration medially. IMPRESSION: Transmetatarsal amputation with irregular osteotomies involving the first through fourth rays. The overlying stump is irregular and osteomyelitis may be present and multifocal. Electronically Signed   By: Tiburcio Pea M.D.   On: 07/30/2023 05:46   DG Foot Complete Left Result Date: 07/30/2023 CLINICAL DATA:  Evaluate for infection EXAM: LEFT FOOT - COMPLETE 3+ VIEW COMPARISON:  06/25/2023 FINDINGS: There is diffuse soft tissue edema. No signs of acute fracture or dislocation. Patient is status post first ray amputation at the level of the first MTP joint. There is a large soft tissue defect involving the plantar aspect of the hindfoot along the undersurface of the calcaneus. Posterior and plantar calcaneal heel spurs. As noted on the previous exam there is a focal cortical defect at the base of the plantar heel spur. The margins of this defect appear more distinct with increase sclerosis. IMPRESSION: 1. Large soft tissue ulcer involving the plantar aspect of the hindfoot along the undersurface of the calcaneus. 2. Focal cortical defect at the base of the plantar heel spur. The margins of this defect appear more distinct with increase sclerosis. Findings are equivocal for osteomyelitis. Consider further evaluation with MRI. 3. Diffuse soft tissue edema. 4. Status post first ray amputation at the level of the first MTP joint. Electronically Signed   By: Signa Kell  M.D.   On: 07/30/2023 05:46   US SCROTUM W/DOPPLER Result Date: 07/30/2023 CLINICAL DATA:  Scrotal swelling. EXAM: SCROTAL ULTRASOUND DOPPLER ULTRASOUND OF THE TESTICLES TECHNIQUE: Complete ultrasound examination of the testicles, epididymis, and other scrotal structures was performed. Color and spectral Doppler ultrasound were also utilized to evaluate blood flow to the testicles. COMPARISON:  None Available. FINDINGS: Right testicle Measurements: 3 x 2.2  x 2.3 cm. Scattered microcalcifications. No mass identified. Left testicle Measurements: 2.9 x 2.2 x 2.2 cm. Scattered microcalcifications identified. No mass identified. Right epididymis:  Not visualized Left epididymis:  Not visualized Hydrocele:  Left hydrocele Varicocele:  None visualized. Pulsed Doppler interrogation of both testes demonstrates normal low resistance arterial and venous waveforms bilaterally. Other: Marked scrotal edema identified which diminishes exam detail. Exam further limited by patient discomfort. IMPRESSION: 1. No evidence for testicular mass or torsion. 2. Marked scrotal edema identified which diminishes exam detail. 3. Left hydrocele. 4. Bilateral testicular microlithiasis. Current literature suggests that testicular microlithiasis is not a significant independent risk factor for development of testicular carcinoma, and that follow up imaging is not warranted in the absence of other risk factors. Monthly testicular self-examination and annual physical exams are considered appropriate surveillance. If patient has other risk factors for testicular carcinoma, then referral to Urology should be considered. (Reference: DeCastro, et al.: A 5-Year Follow up Study of Asymptomatic Men with Testicular Microlithiasis. J Urol 2008; 179:1420-1423.) Electronically Signed   By: Signa Kell M.D.   On: 07/30/2023 05:18   DG CHEST PORT 1 VIEW Result Date: 07/30/2023 CLINICAL DATA:  Dyspnea EXAM: PORTABLE CHEST 1 VIEW COMPARISON:  06/26/2023  FINDINGS: Interval removal of left internal jugular central venous catheter. Pulmonary insufflation is stable. Opacification right costophrenic angle may be artifactual related to overlying soft tissue on this semi erect examination. Lungs are clear. No pneumothorax or pleural effusion. Cardiac size is stably, mildly enlarged. Pulmonary vascularity is normal. Multiple healed left rib fractures are noted. No acute bone abnormality. IMPRESSION: 1. Stable pulmonary insufflation. 2. Mild cardiomegaly. Electronically Signed   By: Helyn Numbers M.D.   On: 07/30/2023 03:08    Pending Labs Unresulted Labs (From admission, onward)     Start     Ordered   08/06/23 0500  Creatinine, serum  (enoxaparin (LOVENOX)    CrCl >/= 30 ml/min)  Weekly,   R     Comments: while on enoxaparin therapy    07/30/23 0337   07/31/23 0500  CBC  Tomorrow morning,   R        07/30/23 1058   07/31/23 0500  Basic metabolic panel  Tomorrow morning,   R        07/30/23 1058   07/31/23 0500  Magnesium  Tomorrow morning,   R        07/30/23 1058   07/30/23 0053  Blood culture (routine x 2)  BLOOD CULTURE X 2,   R (with STAT occurrences)      07/30/23 0052            Vitals/Pain Today's Vitals   07/30/23 0742 07/30/23 1104 07/30/23 1300 07/30/23 1515  BP:  (!) 130/103 102/75 (!) 136/106  Pulse:  (!) 126    Resp:  (!) 21    Temp:  97.8 F (36.6 C)  (!) 97.5 F (36.4 C)  TempSrc:  Oral  Oral  SpO2:  92%  100%  Weight:      Height:      PainSc: 7        Isolation Precautions No active isolations  Medications Medications  sodium chloride flush (NS) 0.9 % injection 10 mL (10 mLs Intravenous Given 07/30/23 1242)  enoxaparin (LOVENOX) injection 40 mg (40 mg Subcutaneous Given 07/30/23 1243)  acetaminophen (TYLENOL) tablet 650 mg (has no administration in time range)    Or  acetaminophen (TYLENOL) suppository 650 mg (has no administration in time range)  senna-docusate (Senokot-S) tablet 1 tablet (has no  administration in time range)  nystatin cream (MYCOSTATIN) 1 Application (1 Application Topical Given 07/30/23 0554)  furosemide (LASIX) 120 mg in dextrose 5 % 50 mL IVPB (120 mg Intravenous New Bag/Given 07/30/23 1458)  insulin glargine-yfgn (SEMGLEE) injection 20 Units (20 Units Subcutaneous Given 07/30/23 0554)  insulin aspart (novoLOG) injection 0-15 Units (3 Units Subcutaneous Given 07/30/23 1259)  insulin aspart (novoLOG) injection 0-5 Units (has no administration in time range)  spironolactone (ALDACTONE) tablet 25 mg (25 mg Oral Given 07/30/23 1243)  ceFEPIme (MAXIPIME) 2 g in sodium chloride 0.9 % 100 mL IVPB (0 g Intravenous Stopped 07/30/23 1356)  vancomycin (VANCOREADY) IVPB 1250 mg/250 mL (has no administration in time range)  furosemide (LASIX) 120 mg in dextrose 5 % 50 mL IVPB (has no administration in time range)  magnesium chloride (SLOW-MAG) 64 MG SR tablet 64 mg (has no administration in time range)  furosemide (LASIX) tablet 40 mg (40 mg Oral Given 07/30/23 0128)  sodium chloride 0.9 % bolus 500 mL (0 mLs Intravenous Stopped 07/30/23 0349)  ceFEPIme (MAXIPIME) 2 g in sodium chloride 0.9 % 100 mL IVPB (0 g Intravenous Stopped 07/30/23 0510)  metroNIDAZOLE (FLAGYL) IVPB 500 mg (0 mg Intravenous Stopped 07/30/23 0419)  vancomycin (VANCOCIN) IVPB 1000 mg/200 mL premix (0 mg Intravenous Stopped 07/30/23 0631)    Followed by  vancomycin (VANCOCIN) IVPB 1000 mg/200 mL premix (0 mg Intravenous Stopped 07/30/23 0732)    Mobility walks with device     Focused Assessments     R Recommendations: See Admitting Provider Note  Report given to:   Additional Notes: Please call or message with any questions.

## 2023-07-30 NOTE — Progress Notes (Signed)
Ankle-brachial index completed. Please see CV Procedures for preliminary results.  Shona Simpson, RVT 07/30/23 2:06 PM

## 2023-07-30 NOTE — Consult Note (Addendum)
Advanced Heart Failure Team Consult Note   Primary Physician: Annett Fabian, MD PCP-Cardiologist:  None  Reason for Consultation: Acute on chronic systolic heart failure, cardiogenic shock  HPI:    Dakota Ramirez is seen today for evaluation of acute on chronic systolic heart failure and cardiogenic shock at the request of Dr. Mickie Bail, Internal medicine.   Braedan Varin is a 52 y.o. 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    Admitted 4/23 with new-onset acute HF.  Given IV lasix. Echo EF 20-25%, regional WMA, moderately dilated LV, RV moderately reduced, RVSP 40 mmHg, mild to moderate MR, dilated IVC with estimated RAP 15 mmHg. Cards consulted and GDMT started. 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  Overall diuresed 50 lbs. No SGLT2i with uncontrolled DM. Discharged home, weight 259 lbs.    cMRI 4/24: severe biventricular failure, LVEF 17%. RVEF 22% LGE imaging very poor quality, which limits interpretation. 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    Follow up 12/26/22, NYHA IIIb, markedly volume overload weight up 40 lbs, ReDs 42%. Advised to go to ED for evaluation, where he was admitted. Diuresed with IV lasix. Echo showed EF <20%, LV with GHK, RV mildly reduced, LA/RA sev dilated, mild-mod MR, mild TR, trivial pericardial effusion. GDMT titrated. Dr Lajoyce Corners consulted for chronic osteomyelitis and Unna boot placed on RLE, no indication for surgical intervention.   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  The last 3 follow ups in AHF clinic he has been volume overloaded and refused direct admission and only wanted to up titrate his diuretics. He was given several Furoscix doses but they did not help much. Non compliant with all other HF medications. He was not very interactive on  assessment and wouldn't fully answer questions. With conversational dyspnea. Denies CP.   Labs reviewed from ED: SCr 1.05, Na 133, K 4.6, BNP 2303, HsTrop 12, Lactic acid 3.1. Blood cultures have been drawn. Starting diuresis.    Home Medications Prior to Admission medications   Medication Sig Start Date End Date Taking? Authorizing Provider  NOVOLOG MIX 70/30 FLEXPEN (70-30) 100 UNIT/ML FlexPen Inject 30 Units into the skin 2 (two) times daily with a meal. 07/01/23  Yes Rocky Morel, DO  potassium chloride SA (KLOR-CON M) 20 MEQ tablet Take 2 tablets (40 mEq total) by mouth daily. Take extra 40 meq on day you take metolazone. Take extra 40 mg on days you use Furoxcix. 07/24/23  Yes [place], Anderson Malta, FNP  spironolactone (ALDACTONE) 25 MG tablet Take 1 tablet (25 mg total) by mouth daily. 07/09/23  Yes Andrey Farmer, PA-C  carvedilol (COREG) 6.25 MG tablet Take 1 tablet (6.25 mg total) by mouth 2 (two) times daily. 07/01/23   Rocky Morel, DO  cefadroxil (DURICEF) 500 MG capsule Take 1 capsule (500 mg total) by mouth 2 (two) times daily. 07/01/23   Rocky Morel, DO  linezolid (ZYVOX) 600 MG tablet Take 1 tablet (600 mg total) by mouth 2 (two) times daily. 07/01/23   Rocky Morel, DO  metolazone (ZAROXOLYN) 5 MG tablet Take 1 tablet (5 mg total) by mouth as directed. 07/24/23 10/22/23  Jacklynn Ganong, FNP  torsemide (DEMADEX) 20 MG tablet Take 4 tablets (80 mg total) by mouth 2 (two) times daily. 07/24/23   Jacklynn Ganong, FNP  Past Medical History: Past Medical History:  Diagnosis Date   CHF (congestive heart failure) (HCC)    Diabetes mellitus without complication (HCC)    Type II   Heart failure with reduced ejection fraction (HCC) 11/26/2021   Hypertension    Sepsis with encephalopathy and septic shock (HCC) 06/26/2023   Shock (HCC) 06/26/2023   Past Surgical History: Past Surgical History:  Procedure Laterality Date   AMPUTATION Left 01/25/2023    Procedure: LEFT GREAT TOE AMPUTATION AND DEBRIDEMENT OF HEEL;  Surgeon: Nadara Mustard, MD;  Location: [facility] OR;  Service: Orthopedics;  Laterality: Left;   BACK SURGERY     CHOLECYSTECTOMY     RIGHT/LEFT HEART CATH AND CORONARY ANGIOGRAPHY N/A 11/30/2021   Procedure: RIGHT/LEFT HEART CATH AND CORONARY ANGIOGRAPHY;  Surgeon: Dolores Patty, MD;  Location: [facility] INVASIVE CV LAB;  Service: Cardiovascular;  Laterality: N/A;   TOE AMPUTATION Right    all toes on RT foot   TRANSESOPHAGEAL ECHOCARDIOGRAM (CATH LAB) N/A 07/01/2023   Procedure: TRANSESOPHAGEAL ECHOCARDIOGRAM;  Surgeon: Romie Minus, MD;  Location: [facility] INVASIVE CV LAB;  Service: Cardiovascular;  Laterality: N/A;   Family History: Family History  Problem Relation Age of Onset   Diabetes Mother    Hypertension Mother    Social History: Social History   Socioeconomic History   Marital status: Single    Spouse name: Not on file   Number of children: Not on file   Years of education: Not on file   Highest education level: Not on file  Occupational History   Not on file  Tobacco Use   Smoking status: Never   Smokeless tobacco: Never  Vaping Use   Vaping status: Never Used  Substance and Sexual Activity   Alcohol use: Not Currently    Comment: rare   Drug use: Never   Sexual activity: Not on file  Other Topics Concern   Not on file  Social History Narrative   Not on file   Social Drivers of Health   Financial Resource Strain: High Risk (01/10/2023)   Overall Financial Resource Strain (CARDIA)    Difficulty of Paying Living Expenses: Hard  Food Insecurity: Food Insecurity Present (12/28/2022)   Hunger Vital Sign    Worried About Running Out of Food in the Last Year: Often true    Ran Out of Food in the Last Year: Often true  Transportation Needs: No Transportation Needs (11/29/2021)   PRAPARE - Administrator, Civil Service (Medical): No    Lack of Transportation (Non-Medical): No  Physical Activity:  Not on file  Stress: No Stress Concern Present (02/28/2021)   Received from [facility], [facility] of Occupational Health - Occupational Stress Questionnaire    Feeling of Stress : Not at all  Social Connections: Unknown (12/15/2021)   Received from [facility], [facility]   Social Network    Social Network: Not on file   Allergies:  No Known Allergies  Objective:   Vital Signs:   Temp:  [97.3 F (36.3 C)-97.4 F (36.3 C)] 97.4 F (36.3 C) (12/17 0507) Pulse Rate:  [125-127] 125 (12/17 0637) Resp:  [16-23] 16 (12/17 0637) BP: (97-124)/(81-96) 121/90 (12/17 0637) SpO2:  [98 %-100 %] 100 % (12/17 0637) Weight:  [139.8 kg] 139.8 kg (12/17 0033)    Weight change: Filed Weights   07/30/23 0033  Weight: (!) 139.8 kg   Intake/Output:   Intake/Output Summary (Last 24 hours) at 07/30/2023 1478  Last data filed at 07/30/2023 0631 Gross per 24 hour  Intake 910 ml  Output --  Net 910 ml    Physical Exam  General:  restless appearing.  +conversational dyspnea HEENT: normal Neck: supple. JVD to ear. Carotids 2+ bilat; no bruits. No lymphadenopathy or thyromegaly appreciated. Cor: PMI nondisplaced. Tachy/regular rate & rhythm. No rubs, gallops or murmurs. Lungs: diminished bases, coarse throughout  Abdomen: soft, nontender, nondistended. No hepatosplenomegaly. No bruits or masses. Good bowel sounds. Extremities: no cyanosis, clubbing, rash, +2-3 BLE edema to abd Neuro: alert & oriented x 3, cranial nerves grossly intact. moves all 4 extremities w/o difficulty.  Telemetry   ST 120s (Personally reviewed)    EKG    ST 120  Labs   Basic Metabolic Panel: Recent Labs  Lab 07/24/23 1451 07/30/23 0044 07/30/23 0424  NA 129* 133*  --   K 4.6 4.6  --   CL 97* 97*  --   CO2 22 26  --   GLUCOSE 197* 281*  --   BUN 35* 25*  --   CREATININE 2.22* 1.05 1.11  CALCIUM 7.8* 8.3*  --     Liver Function Tests: Recent Labs  Lab 07/24/23 1451  07/30/23 0253  AST 48* 31  ALT 23 20  ALKPHOS 191* 177*  BILITOT 1.2* 0.9  PROT 8.5* 8.1  ALBUMIN 1.8* 1.8*   No results for input(s): "LIPASE", "AMYLASE" in the last 168 hours. No results for input(s): "AMMONIA" in the last 168 hours.  CBC: Recent Labs  Lab 07/30/23 0044  WBC 8.8  HGB 12.6*  HCT 40.7  MCV 89.6  PLT 256   Cardiac Enzymes: No results for input(s): "CKTOTAL", "CKMB", "CKMBINDEX", "TROPONINI" in the last 168 hours.  BNP: BNP (last 3 results) Recent Labs    07/09/23 1353 07/24/23 1451 07/30/23 0044  BNP 1,441.0* 1,670.4* 2,303.1*   ProBNP (last 3 results) No results for input(s): "PROBNP" in the last 8760 hours.  CBG: Recent Labs  Lab 07/30/23 0849  GLUCAP 243*   Coagulation Studies: No results for input(s): "LABPROT", "INR" in the last 72 hours.  Imaging   DG Foot Complete Right Result Date: 07/30/2023 CLINICAL DATA:  Foot infection EXAM: RIGHT FOOT COMPLETE - 3 VIEW COMPARISON:  None Available. FINDINGS: Transmetatarsal amputation. Irregular margination of osteotomies involving the first through fourth rays. The fifth ray appears corticated and smoothly contoured. No acute fracture or dislocation. Punctate metallic density near the first metatarsal stump, likely chronic. The soft tissue stone is irregularly marginated with possible fissuring or ulceration medially. IMPRESSION: Transmetatarsal amputation with irregular osteotomies involving the first through fourth rays. The overlying stump is irregular and osteomyelitis may be present and multifocal. Electronically Signed   By: Tiburcio Pea M.D.   On: 07/30/2023 05:46   DG Foot Complete Left Result Date: 07/30/2023 CLINICAL DATA:  Evaluate for infection EXAM: LEFT FOOT - COMPLETE 3+ VIEW COMPARISON:  06/25/2023 FINDINGS: There is diffuse soft tissue edema. No signs of acute fracture or dislocation. Patient is status post first ray amputation at the level of the first MTP joint. There is a large  soft tissue defect involving the plantar aspect of the hindfoot along the undersurface of the calcaneus. Posterior and plantar calcaneal heel spurs. As noted on the previous exam there is a focal cortical defect at the base of the plantar heel spur. The margins of this defect appear more distinct with increase sclerosis. IMPRESSION: 1. Large soft tissue ulcer involving the plantar aspect of the  hindfoot along the undersurface of the calcaneus. 2. Focal cortical defect at the base of the plantar heel spur. The margins of this defect appear more distinct with increase sclerosis. Findings are equivocal for osteomyelitis. Consider further evaluation with MRI. 3. Diffuse soft tissue edema. 4. Status post first ray amputation at the level of the first MTP joint. Electronically Signed   By: Signa Kell M.D.   On: 07/30/2023 05:46   US SCROTUM W/DOPPLER Result Date: 07/30/2023 CLINICAL DATA:  Scrotal swelling. EXAM: SCROTAL ULTRASOUND DOPPLER ULTRASOUND OF THE TESTICLES TECHNIQUE: Complete ultrasound examination of the testicles, epididymis, and other scrotal structures was performed. Color and spectral Doppler ultrasound were also utilized to evaluate blood flow to the testicles. COMPARISON:  None Available. FINDINGS: Right testicle Measurements: 3 x 2.2 x 2.3 cm. Scattered microcalcifications. No mass identified. Left testicle Measurements: 2.9 x 2.2 x 2.2 cm. Scattered microcalcifications identified. No mass identified. Right epididymis:  Not visualized Left epididymis:  Not visualized Hydrocele:  Left hydrocele Varicocele:  None visualized. Pulsed Doppler interrogation of both testes demonstrates normal low resistance arterial and venous waveforms bilaterally. Other: Marked scrotal edema identified which diminishes exam detail. Exam further limited by patient discomfort. IMPRESSION: 1. No evidence for testicular mass or torsion. 2. Marked scrotal edema identified which diminishes exam detail. 3. Left hydrocele.  4. Bilateral testicular microlithiasis. Current literature suggests that testicular microlithiasis is not a significant independent risk factor for development of testicular carcinoma, and that follow up imaging is not warranted in the absence of other risk factors. Monthly testicular self-examination and annual physical exams are considered appropriate surveillance. If patient has other risk factors for testicular carcinoma, then referral to Urology should be considered. (Reference: DeCastro, et al.: A 5-Year Follow up Study of Asymptomatic Men with Testicular Microlithiasis. J Urol 2008; 179:1420-1423.) Electronically Signed   By: Signa Kell M.D.   On: 07/30/2023 05:18   DG CHEST PORT 1 VIEW Result Date: 07/30/2023 CLINICAL DATA:  Dyspnea EXAM: PORTABLE CHEST 1 VIEW COMPARISON:  06/26/2023 FINDINGS: Interval removal of left internal jugular central venous catheter. Pulmonary insufflation is stable. Opacification right costophrenic angle may be artifactual related to overlying soft tissue on this semi erect examination. Lungs are clear. No pneumothorax or pleural effusion. Cardiac size is stably, mildly enlarged. Pulmonary vascularity is normal. Multiple healed left rib fractures are noted. No acute bone abnormality. IMPRESSION: 1. Stable pulmonary insufflation. 2. Mild cardiomegaly. Electronically Signed   By: Helyn Numbers M.D.   On: 07/30/2023 03:08     Medications:   Current Medications:  enoxaparin (LOVENOX) injection  40 mg Subcutaneous Q24H   insulin aspart  0-15 Units Subcutaneous TID WC   insulin aspart  0-5 Units Subcutaneous QHS   insulin glargine-yfgn  20 Units Subcutaneous Daily   nystatin cream  1 Application Topical BID   sodium chloride flush  10 mL Intravenous Q12H   spironolactone  25 mg Oral Daily    Infusions:  ceFEPime (MAXIPIME) IV     furosemide     vancomycin      Patient Profile  CHAPMAN TRETTIN is a 52 y.o. male w/ chronic systolic heart failure w/ severe  biventricular dysfunction due to NICM, uncontrolled T2DM, chronic LE wounds/ chronic osteomyelitis s/p left toe amputation, rt TMA and poor compliance. AHF team to see for acute on chronic systolic heart failure and cardiogenic shock.    Assessment/Plan  Acute on chronic systolic heart failure with cardiogenic shock - NICM. Reports hx CHF in his mother.  -  R/LHC (4/23): showed normal cors, severe NICM EF 20-25% and mildly elevated filling pressures w/ normal CO - cMRI (4/24): showed severe biventricular failure, LVEF 17%. RVEF 22% LGE imaging very poor quality, which limits interpretation. Appears to have RV insertion site LGE (associated with worse prognosis) - Echo (5/24): EF < 20%, LV with GHK, RV mildly reduced, LA/RA sev dilated, mild-mod MR, mild TR, trivial pericardial effusion.  - Echo 11/24: EF 20-25%, RV mildly reduced, mild MR - NYHA IV on admission. SCAI stage B. Suspect exacerbation 2/2 noncompliance and drug use.  - Lactic acid 3.1>2.5. Continue to trend. - Massively volume overloaded, increase lasix 120 mg daily>BID. Diuresed well during the last admission without inotropic support. If UOP poor can add inotropic support.  - Continue spironolactone 25 mg daily  - No SGLT2i with uncontrolled DM and concern for hygiene. - Hold BB with volume overload and cardiogenic shock.   - Hold UNNA boots with chronic BLE wounds - Fluid restrict. Strict I&O, daily weights - He would benefit from digoxin but I worry about compliance  - Not candidate for ICD with ongoing LE wounds/infection.  - Not a candidate for advanced therapies given poor compliance and co morbidities   Chronic foot wounds/osteomyelitis - Treated for cellulitis/osteomyelitis recent admit - Xray left foot concerning for osteomyelitis - No evidence of vegetation on TEE - Seen by ID during recent admit. Concerned for culture neg endocarditis. He left AMA. Discharged with course of po cefadroxil and linezolid. Not candidate  for home PICC line. - Continue cefepime and vanc  Poor med compliance/SDOH - lives in a camper behind his fathers house. Unemployed - declined paramedicine - high risk for frequent readmits - Can consider palliative consult.   Length of Stay: 0  Alen Bleacher, NP  07/30/2023, 9:47 AM  Advanced Heart Failure Team Pager [phone] (M-F; 7a - 5p)  Please contact [facility] for night-coverage after hours (4p -7a ) and weekends on [email or web address]

## 2023-07-30 NOTE — Procedures (Signed)
   Urology Procedure Note:   [facility] was consulted for difficult catheter placement on Mr. Dakota Ramirez.  Patient presented to [facility] emergency department with an acute heart failure exacerbation requiring significant diuresis.  Foley catheter could not be placed due to his severe and global edema, resulting in stiff indurated tissue surrounding his groin.  Please see separate consult note  On my arrival patient was able to urinate 2 bladder scans of 0.  However due to his edema/anatomical changes, a pure wick and accurate intake/output could not be achieved without a catheter placement.  After consent was obtained patient was prepped and draped in the usual sterile fashion.  I was able to start a 51f Foley catheter blindly but could not advance past the prostate.  Unfortunately there were no coud catheters available on the urology cart and penis was buried too deep to start a sensor wire.  This only left me the option to proceed with cystoscopy and over wire placement.  The urethral meatus was easily identified under direct visualization.  I then advanced the catheter without much difficulty to the level of the bladder.  A sensor wire was passed through the scope and the cystoscope was offloaded.  Next a well-lubricated 65F council catheter was advanced over wire with immediate return of clear yellow urine.  Once placement confirmed, the retention balloon was inflated with 10 cc of sterile water and placed to gravity drainage without dependent loops.  Patient had around 100 cc of clear yellow urine in the bladder.  Catheter was secured on the right upper thigh with a retention strap.   As patient was not retaining the Foley catheter can be removed at any point.  However it will require probably as much efforts but it back again.  Please do not remove without discussing with urology.

## 2023-07-30 NOTE — Progress Notes (Signed)
PHARMACY ANTIBIOTIC CONSULT NOTE   JJUAN MASTROGIOVANNI a 52 y.o. male p/w SOB presumed 2/2 HF exacerbation. Also c/f DFI with ulceration of feet. Recent L great toe amputation in June, R TMA 02/2021.  Pharmacy has been consulted for Cefepime and Vancomycin dosing.  12/17: Scr 1.11 (~BL), WBC 8.8  Vital Signs: hypothermic, HR elevated , BP labile (soft-WNL)   Estimated Creatinine Clearance: 112.6 mL/min (by C-G formula based on SCr of 1.11 mg/dL).  Plan: START Cefepime 2g IV Q8H GIVE Vancomycin 2,000 mg IV x1  THEN Vancomycin 1,250 mg IV Q12H (Scr used: 1.11, Vd used: 0.5, eAUC: 483) Monitor renal function, clinical status, de-escalation, C/S, levels as indicated   Allergies:  No Known Allergies  Filed Weights   07/30/23 0033  Weight: (!) 139.8 kg (308 lb 3.2 oz)       Latest Ref Rng & Units 07/30/2023   12:44 AM 07/01/2023    4:30 AM 06/30/2023    3:24 AM  CBC  WBC 4.0 - 10.5 K/uL 8.8  11.5  10.4   Hemoglobin 13.0 - 17.0 g/dL 08.6  57.8  46.9   Hematocrit 39.0 - 52.0 % 40.7  40.7  39.9   Platelets 150 - 400 K/uL 256  163  181     Antibiotics Given (last 72 hours)     Date/Time Action Medication Dose Rate   07/30/23 0317 New Bag/Given   metroNIDAZOLE (FLAGYL) IVPB 500 mg 500 mg 100 mL/hr   07/30/23 0440 New Bag/Given   ceFEPIme (MAXIPIME) 2 g in sodium chloride 0.9 % 100 mL IVPB 2 g 200 mL/hr   07/30/23 0542 New Bag/Given   vancomycin (VANCOCIN) IVPB 1000 mg/200 mL premix 1,000 mg 200 mL/hr       Antimicrobials this admission: Cefepime 12/17>> Vancomycin 12/17>> MTZ x1 12/17   Microbiology results: 12/17 Bcx: sent  Thank you for allowing pharmacy to be a part of this patient's care.  Jani Gravel, PharmD Clinical Pharmacist  07/30/2023 5:51 AM

## 2023-07-30 NOTE — Consult Note (Signed)
Urology Consult Note   Requesting Attending Physician:  Ginnie Smart, MD Service Providing Consult: Urology  Consulting Attending: [facility]   Reason for Consult: Difficult Foley placement  HPI: Dakota Ramirez is seen in consultation for reasons noted above at the request of Ginnie Smart, MD. patient is a 52 year old male presenting to [facility] emergency department with shortness of breath and more retention/swelling.  PMH significant for HFrEF, hypertension, and uncontrolled T2DM. Patient is not in urinary obstruction and had two bladder scans while I was in the room with a PVR of 0.  Internal medicine team is requesting Foley catheter placement.  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  [facility] was consulted to place Foley catheter.  On my arrival patient was alert, oriented, and struggling with shortness of breath.  Pulse ox read 100% during this time.  Occupational therapy was just leaving having created a scrotal sling for him.  He reports no lower urinary tract symptoms and states that he can urinate without difficulty.  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  Please see separate procedure note. he does not appear to need catheterization for anything other than accurate intake and output.  That being said, do not remove this without consulting urology.  Will sign off at this time.  Case and plan discussed with Dr. Berneice Heinrich  Past Medical History: Past Medical History:  Diagnosis Date   CHF (congestive heart failure) (HCC)    Diabetes mellitus without complication (HCC)    Type II   Heart failure with reduced ejection fraction (HCC) 11/26/2021   Hypertension    Sepsis with encephalopathy and septic shock (HCC) 06/26/2023    Shock (HCC) 06/26/2023    Past Surgical History:  Past Surgical History:  Procedure Laterality Date   AMPUTATION Left 01/25/2023   Procedure: LEFT GREAT TOE AMPUTATION AND DEBRIDEMENT OF HEEL;  Surgeon: Nadara Mustard, MD;  Location: [facility] OR;  Service: Orthopedics;  Laterality: Left;   BACK SURGERY     CHOLECYSTECTOMY     RIGHT/LEFT HEART CATH AND CORONARY ANGIOGRAPHY N/A 11/30/2021   Procedure: RIGHT/LEFT HEART CATH AND CORONARY ANGIOGRAPHY;  Surgeon: Dolores Patty, MD;  Location: [facility] INVASIVE CV LAB;  Service: Cardiovascular;  Laterality: N/A;   TOE AMPUTATION Right    all toes on RT foot   TRANSESOPHAGEAL ECHOCARDIOGRAM (CATH LAB) N/A 07/01/2023   Procedure: TRANSESOPHAGEAL ECHOCARDIOGRAM;  Surgeon: Romie Minus, MD;  Location: [facility] INVASIVE CV LAB;  Service: Cardiovascular;  Laterality: N/A;    Medication: Current Facility-Administered Medications  Medication Dose Route Frequency Provider Last Rate Last Admin   acetaminophen (TYLENOL) tablet 650 mg  650 mg Oral Q6H PRN Morene Crocker, MD       Or   acetaminophen (TYLENOL) suppository 650 mg  650 mg Rectal Q6H PRN Morene Crocker, MD       ceFEPIme (MAXIPIME) 2 g in sodium chloride 0.9 % 100 mL IVPB  2 g Intravenous Q8H Paytes, Austin A, RPH 200 mL/hr at 07/30/23 1244 2 g at 07/30/23 1244   enoxaparin (LOVENOX) injection 40 mg  40 mg Subcutaneous Q24H Morene Crocker, MD   40 mg at 07/30/23 1243   furosemide (LASIX) 120 mg in dextrose 5 % 50 mL IVPB  120 mg Intravenous Once Aundra Dubin, MD  furosemide (LASIX) 120 mg in dextrose 5 % 50 mL IVPB  120 mg Intravenous BID Brynda Peon L, NP       insulin aspart (novoLOG) injection 0-15 Units  0-15 Units Subcutaneous TID WC Morene Crocker, MD       insulin aspart (novoLOG) injection 0-5 Units  0-5 Units Subcutaneous QHS Morene Crocker, MD       insulin glargine-yfgn [facility]) injection 20 Units  20 Units Subcutaneous Daily Morene Crocker, MD   20 Units at 07/30/23 0554   nystatin cream (MYCOSTATIN) 1 Application  1 Application Topical BID Morene Crocker, MD   1 Application at 07/30/23 0554   senna-docusate (Senokot-S) tablet 1 tablet  1 tablet Oral QHS PRN Morene Crocker, MD       sodium chloride flush (NS) 0.9 % injection 10 mL  10 mL Intravenous Q12H Morene Crocker, MD   10 mL at 07/30/23 1242   spironolactone (ALDACTONE) tablet 25 mg  25 mg Oral Daily Aundra Dubin, MD   25 mg at 07/30/23 1243   vancomycin (VANCOREADY) IVPB 1250 mg/250 mL  1,250 mg Intravenous Q12H Paytes, Florentina Addison, RPH       Current Outpatient Medications  Medication Sig Dispense Refill   NOVOLOG MIX 70/30 FLEXPEN (70-30) 100 UNIT/ML FlexPen Inject 30 Units into the skin 2 (two) times daily with a meal. 15 mL 3   potassium chloride SA (KLOR-CON M) 20 MEQ tablet Take 2 tablets (40 mEq total) by mouth daily. Take extra 40 meq on day you take metolazone. Take extra 40 mg on days you use Furoxcix. 120 tablet 5   spironolactone (ALDACTONE) 25 MG tablet Take 1 tablet (25 mg total) by mouth daily. 90 tablet 3   carvedilol (COREG) 6.25 MG tablet Take 1 tablet (6.25 mg total) by mouth 2 (two) times daily. 180 tablet 0   cefadroxil (DURICEF) 500 MG capsule Take 1 capsule (500 mg total) by mouth 2 (two) times daily. 28 capsule 0   linezolid (ZYVOX) 600 MG tablet Take 1 tablet (600 mg total) by mouth 2 (two) times daily. 28 tablet 0   metolazone (ZAROXOLYN) 5 MG tablet Take 1 tablet (5 mg total) by mouth as directed. 5 tablet 3   torsemide (DEMADEX) 20 MG tablet Take 4 tablets (80 mg total) by mouth 2 (two) times daily. 240 tablet 5    Allergies: No Known Allergies  Social History: Social History   Tobacco Use   Smoking status: Never   Smokeless tobacco: Never  Vaping Use   Vaping status: Never Used  Substance Use Topics   Alcohol use: Not Currently    Comment: rare   Drug use: Never    Family History Family History   Problem Relation Age of Onset   Diabetes Mother    Hypertension Mother     Review of Systems  Respiratory:  Positive for shortness of breath.   Genitourinary:  Negative for dysuria, flank pain, frequency, hematuria and urgency.  Musculoskeletal:  Positive for back pain.     Objective   Vital signs in last 24 hours: BP (!) 130/103 (BP Location: Right Arm)   Pulse (!) 126   Temp 97.8 F (36.6 C) (Oral)   Resp (!) 21   Ht 5\' 11"  (1.803 m)   Wt (!) 139.8 kg   SpO2 92%   BMI 42.99 kg/m   Physical Exam: General: chronically ill, A&O, resting, appropriate HEENT: [place]/AT Pulmonary: Normal work of breathing Cardiovascular: no cyanosis Abdomen: Soft, NTTP,  nondistended GU: buried penis with indurated surrounding tissue 2/2 global edema Neuro: Appropriate, no focal neurological deficits  Most Recent Labs: Lab Results  Component Value Date   WBC 8.8 07/30/2023   HGB 12.6 (L) 07/30/2023   HCT 40.7 07/30/2023   PLT 256 07/30/2023    Lab Results  Component Value Date   NA 133 (L) 07/30/2023   K 4.6 07/30/2023   CL 97 (L) 07/30/2023   CO2 26 07/30/2023   BUN 25 (H) 07/30/2023   CREATININE 1.11 07/30/2023   CALCIUM 8.3 (L) 07/30/2023   MG 1.2 (L) 07/01/2023   PHOS 2.1 (L) 07/01/2023    Lab Results  Component Value Date   INR 1.3 (H) 12/27/2022     Urine Culture: @[id number] (laburin,org,[id number],[id number])@   IMAGING: DG Foot Complete Right Result Date: 07/30/2023 CLINICAL DATA:  Foot infection EXAM: RIGHT FOOT COMPLETE - 3 VIEW COMPARISON:  None Available. FINDINGS: Transmetatarsal amputation. Irregular margination of osteotomies involving the first through fourth rays. The fifth ray appears corticated and smoothly contoured. No acute fracture or dislocation. Punctate metallic density near the first metatarsal stump, likely chronic. The soft tissue stone is irregularly marginated with possible fissuring or ulceration medially. IMPRESSION: Transmetatarsal amputation with  irregular osteotomies involving the first through fourth rays. The overlying stump is irregular and osteomyelitis may be present and multifocal. Electronically Signed   By: Tiburcio Pea M.D.   On: 07/30/2023 05:46   DG Foot Complete Left Result Date: 07/30/2023 CLINICAL DATA:  Evaluate for infection EXAM: LEFT FOOT - COMPLETE 3+ VIEW COMPARISON:  06/25/2023 FINDINGS: There is diffuse soft tissue edema. No signs of acute fracture or dislocation. Patient is status post first ray amputation at the level of the first MTP joint. There is a large soft tissue defect involving the plantar aspect of the hindfoot along the undersurface of the calcaneus. Posterior and plantar calcaneal heel spurs. As noted on the previous exam there is a focal cortical defect at the base of the plantar heel spur. The margins of this defect appear more distinct with increase sclerosis. IMPRESSION: 1. Large soft tissue ulcer involving the plantar aspect of the hindfoot along the undersurface of the calcaneus. 2. Focal cortical defect at the base of the plantar heel spur. The margins of this defect appear more distinct with increase sclerosis. Findings are equivocal for osteomyelitis. Consider further evaluation with MRI. 3. Diffuse soft tissue edema. 4. Status post first ray amputation at the level of the first MTP joint. Electronically Signed   By: Signa Kell M.D.   On: 07/30/2023 05:46   US SCROTUM W/DOPPLER Result Date: 07/30/2023 CLINICAL DATA:  Scrotal swelling. EXAM: SCROTAL ULTRASOUND DOPPLER ULTRASOUND OF THE TESTICLES TECHNIQUE: Complete ultrasound examination of the testicles, epididymis, and other scrotal structures was performed. Color and spectral Doppler ultrasound were also utilized to evaluate blood flow to the testicles. COMPARISON:  None Available. FINDINGS: Right testicle Measurements: 3 x 2.2 x 2.3 cm. Scattered microcalcifications. No mass identified. Left testicle Measurements: 2.9 x 2.2 x 2.2 cm. Scattered  microcalcifications identified. No mass identified. Right epididymis:  Not visualized Left epididymis:  Not visualized Hydrocele:  Left hydrocele Varicocele:  None visualized. Pulsed Doppler interrogation of both testes demonstrates normal low resistance arterial and venous waveforms bilaterally. Other: Marked scrotal edema identified which diminishes exam detail. Exam further limited by patient discomfort. IMPRESSION: 1. No evidence for testicular mass or torsion. 2. Marked scrotal edema identified which diminishes exam detail. 3. Left hydrocele. 4. Bilateral testicular microlithiasis. Current literature  suggests that testicular microlithiasis is not a significant independent risk factor for development of testicular carcinoma, and that follow up imaging is not warranted in the absence of other risk factors. Monthly testicular self-examination and annual physical exams are considered appropriate surveillance. If patient has other risk factors for testicular carcinoma, then referral to Urology should be considered. (Reference: DeCastro, et al.: A 5-Year Follow up Study of Asymptomatic Men with Testicular Microlithiasis. J Urol 2008; 179:1420-1423.) Electronically Signed   By: Signa Kell M.D.   On: 07/30/2023 05:18   DG CHEST PORT 1 VIEW Result Date: 07/30/2023 CLINICAL DATA:  Dyspnea EXAM: PORTABLE CHEST 1 VIEW COMPARISON:  06/26/2023 FINDINGS: Interval removal of left internal jugular central venous catheter. Pulmonary insufflation is stable. Opacification right costophrenic angle may be artifactual related to overlying soft tissue on this semi erect examination. Lungs are clear. No pneumothorax or pleural effusion. Cardiac size is stably, mildly enlarged. Pulmonary vascularity is normal. Multiple healed left rib fractures are noted. No acute bone abnormality. IMPRESSION: 1. Stable pulmonary insufflation. 2. Mild cardiomegaly. Electronically Signed   By: Helyn Numbers M.D.   On: 07/30/2023 03:08     ------  Elmon Kirschner, NP Pager: [phone]   Please contact the urology consult pager with any further questions/concerns.

## 2023-07-30 NOTE — Hospital Course (Addendum)
Acute on Chronic Systolic Heart Failure  Mr Madey was agressively diuresed after presenting approximately 60 poinds over a dry weight of 250lbs. Diuresis occurred over 7 days, initially with lasix drip and then transitioned to oral bumex. He was followed by the cardiology/heart failure team. In-hospital ECHO revealed EF below 10% and he was tachycardic throughout hospitalization. Pre-hospital ECHO with EF 20-25%. Symptomatically, he initially reported dyspnea and pain due to anasarca. These improved as he was decongested. Medication adherence is likely cause of this exacerbation - he does not take his medicine as an outpatient although he follows with cardiology. In this setting, his prognosis was very guarded. Palliative medicine was brought on board, the patient opted to be made DNR. A GOC with family was to be performed on 12/23 but patient became too agitated to remain in hospital until then. He was discharged on the following cardiac regimen: - Entresto 24/26mg  BID - Potassium daily - Spironolactone 25mg  daily - Bumex 4mg  BID - Holding BB Coreg  Chronic wound infection with chronic Osteomyelitis Inguinal fold intertrigo MRI at admission suggested acute on chronic osteomyelitis. Wounds were examined and cared for by wound team, but he was not acutely ill from this infection. Per discussion with patient, he does not want to pursue surgery. While here, we treated with cefepime and daptomycin (initially vanco but changed due to creatinine elevation). He will be discharged on linezolid 600 twice daily and cefadroxil 500 twice daily x 6 weeks.  This was chosen because it was his discharge regimen at his last hospitalization where and blood cultures grew micrococcus gluteus.  At that time there was concern for vegetation demonstrated on TEE on 11/18, however repeat TEE during this hospitalization ruled out any vegetations. 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  He had recent septic osteomyelitis in November, but no surgical indication at that time. He also had intertrigo in the inguinal folds that was treated with nystatin powder. - Blood cultures without growth - Daptomycin and Cefepime as inpatient, linezolid and cefadroxil on discharge - MRI bilateral feet reveals possible early osteomyelitis of L calcaneus, R 2nd/3rd metatarsals and possible midfoot vs arthropathy   Anasarca, Scrotal Edema Severe scrotal edema impeded placement of urinary catheter for which urology was consulted and achieved. Discomfort via scrotal sling. - US scrotum with possible L hydrocele - Catheter removed and pt left before trial of voiding. Risks were explained to him and he opted to leave without trial.   Uncontrolled Type 2 Diabetes Recent A1c ws 10.3. Likely inconsistent with medicine use. Home regiment is novolog 30 BID. - Semglee 30 daily and SSI   Hypertension Pressures stable. Holding home coreg 6.25 BID. Continue home spironolactone. Started on entresto for GDMT.

## 2023-07-31 ENCOUNTER — Inpatient Hospital Stay (HOSPITAL_COMMUNITY): Payer: 59

## 2023-07-31 DIAGNOSIS — E1169 Type 2 diabetes mellitus with other specified complication: Secondary | ICD-10-CM

## 2023-07-31 DIAGNOSIS — I5023 Acute on chronic systolic (congestive) heart failure: Secondary | ICD-10-CM | POA: Diagnosis not present

## 2023-07-31 DIAGNOSIS — R0602 Shortness of breath: Secondary | ICD-10-CM | POA: Diagnosis not present

## 2023-07-31 DIAGNOSIS — M86172 Other acute osteomyelitis, left ankle and foot: Secondary | ICD-10-CM | POA: Diagnosis not present

## 2023-07-31 LAB — GLUCOSE, CAPILLARY
Glucose-Capillary: 139 mg/dL — ABNORMAL HIGH (ref 70–99)
Glucose-Capillary: 140 mg/dL — ABNORMAL HIGH (ref 70–99)
Glucose-Capillary: 180 mg/dL — ABNORMAL HIGH (ref 70–99)
Glucose-Capillary: 180 mg/dL — ABNORMAL HIGH (ref 70–99)

## 2023-07-31 LAB — BASIC METABOLIC PANEL
Anion gap: 8 (ref 5–15)
BUN: 26 mg/dL — ABNORMAL HIGH (ref 6–20)
CO2: 26 mmol/L (ref 22–32)
Calcium: 8.1 mg/dL — ABNORMAL LOW (ref 8.9–10.3)
Chloride: 100 mmol/L (ref 98–111)
Creatinine, Ser: 1.35 mg/dL — ABNORMAL HIGH (ref 0.61–1.24)
GFR, Estimated: >60 mL/min (ref >60)
Glucose, Bld: 108 mg/dL — ABNORMAL HIGH (ref 70–99)
Potassium: 4.1 mmol/L (ref 3.5–5.1)
Sodium: 134 mmol/L — ABNORMAL LOW (ref 135–145)

## 2023-07-31 LAB — ECHOCARDIOGRAM COMPLETE
Area-P 1/2: 5.54 cm2
Calc EF: 15.6 %
Est EF: <10
Height: 71 in
MV M vel: 4.72 m/s
MV Peak grad: 89.1 mm[Hg]
Radius: 0.5 cm
S' Lateral: 6 cm
Single Plane A2C EF: 14.4 %
Single Plane A4C EF: 10.5 %
Weight: 4913.61 [oz_av]

## 2023-07-31 LAB — CBC
HCT: 38 % — ABNORMAL LOW (ref 39.0–52.0)
Hemoglobin: 12 g/dL — ABNORMAL LOW (ref 13.0–17.0)
MCH: 27.6 pg (ref 26.0–34.0)
MCHC: 31.6 g/dL (ref 30.0–36.0)
MCV: 87.6 fL (ref 80.0–100.0)
Platelets: 243 10*3/uL (ref 150–400)
RBC: 4.34 MIL/uL (ref 4.22–5.81)
RDW: 17.7 % — ABNORMAL HIGH (ref 11.5–15.5)
WBC: 8.9 10*3/uL (ref 4.0–10.5)
nRBC: 0.2 % (ref 0.0–0.2)

## 2023-07-31 LAB — MAGNESIUM: Magnesium: 1.3 mg/dL — ABNORMAL LOW (ref 1.7–2.4)

## 2023-07-31 MED ORDER — SODIUM CHLORIDE 0.9 % IV SOLN: INTRAVENOUS | Status: AC | PRN

## 2023-07-31 MED ORDER — JUVEN PO PACK
1.0000 | PACK | Freq: Two times a day (BID) | ORAL | Status: DC
Start: 2023-08-01 — End: 2023-08-04
  Administered 2023-08-01 – 2023-08-04 (×7): 1 via ORAL
  Filled 2023-07-31 (×7): qty 1

## 2023-07-31 MED ORDER — PERFLUTREN LIPID MICROSPHERE
1.0000 mL | INTRAVENOUS | Status: AC | PRN
  Administered 2023-07-31: 2 mL via INTRAVENOUS

## 2023-07-31 MED ORDER — PROSOURCE PLUS PO LIQD
30.0000 mL | Freq: Three times a day (TID) | ORAL | Status: DC
  Administered 2023-07-31 – 2023-08-04 (×11): 30 mL via ORAL
  Filled 2023-07-31 (×11): qty 30

## 2023-07-31 MED ORDER — MAGNESIUM SULFATE 4 GM/100ML IV SOLN
4.0000 g | Freq: Once | INTRAVENOUS | Status: AC
  Administered 2023-07-31: 4 g via INTRAVENOUS
  Filled 2023-07-31: qty 100

## 2023-07-31 MED ORDER — POTASSIUM CHLORIDE CRYS ER 20 MEQ PO TBCR
40.0000 meq | EXTENDED_RELEASE_TABLET | Freq: Two times a day (BID) | ORAL | Status: DC
  Administered 2023-07-31: 40 meq via ORAL
  Filled 2023-07-31: qty 2

## 2023-07-31 MED ORDER — FUROSEMIDE 10 MG/ML IJ SOLN
20.0000 mg/h | INTRAVENOUS | Status: AC
  Administered 2023-07-31: 15 mg/h via INTRAVENOUS
  Administered 2023-08-01 – 2023-08-03 (×7): 20 mg/h via INTRAVENOUS
  Filled 2023-07-31 (×9): qty 20

## 2023-07-31 MED ORDER — ADULT MULTIVITAMIN W/MINERALS CH
1.0000 | ORAL_TABLET | Freq: Every day | ORAL | Status: DC
  Administered 2023-07-31 – 2023-08-04 (×5): 1 via ORAL
  Filled 2023-07-31 (×5): qty 1

## 2023-07-31 MED ORDER — POTASSIUM CHLORIDE CRYS ER 20 MEQ PO TBCR
40.0000 meq | EXTENDED_RELEASE_TABLET | Freq: Once | ORAL | Status: AC
  Administered 2023-07-31: 40 meq via ORAL
  Filled 2023-07-31: qty 2

## 2023-07-31 MED ORDER — METOLAZONE 5 MG PO TABS
10.0000 mg | ORAL_TABLET | Freq: Once | ORAL | Status: AC
  Administered 2023-07-31: 10 mg via ORAL
  Filled 2023-07-31: qty 2

## 2023-07-31 NOTE — Progress Notes (Signed)
Advanced Heart Failure Rounding Note  Cardiologist: None   Subjective:    ~2.5L UOP last 24 hrs with IV lasix. No change in weight.   Now has foley d/t marked edema involving groin.   He is miserable. Feels really hot. Dyspneic at rest.   Objective:   Weight Range: (!) 139.3 kg Body mass index is 42.83 kg/m.   Vital Signs:   Temp:  [97.5 F (36.4 C)-98 F (36.7 C)] 97.7 F (36.5 C) (12/18 0810) Pulse Rate:  [124-130] 124 (12/18 0810) Resp:  [16-21] 19 (12/18 0810) BP: (102-136)/(75-106) 121/94 (12/18 0810) SpO2:  [92 %-100 %] 100 % (12/18 0810) Weight:  [139.3 kg-140 kg] 139.3 kg (12/18 0400) Last BM Date : 07/30/23  Weight change: Filed Weights   07/30/23 0033 07/30/23 1601 07/31/23 0400  Weight: (!) 139.8 kg (!) 140 kg (!) 139.3 kg    Intake/Output:   Intake/Output Summary (Last 24 hours) at 07/31/2023 1031 Last data filed at 07/31/2023 0857 Gross per 24 hour  Intake 1169.89 ml  Output 2475 ml  Net -1305.11 ml      Physical Exam    General:  Acute on chronically ill appearing.  HEENT: Multiple wounds Neck: Supple. JVP to jaw.  Cor: PMI nondisplaced. Regular rate & rhythm, tachy. No rubs, gallops or murmurs. Lungs: Diminished Abdomen: + distended Extremities: No cyanosis, clubbing, rash, 2+ edema to waist Neuro: Awake. Affect flat.    Telemetry   ST 120s   Labs    CBC Recent Labs    07/30/23 0044 07/31/23 0256  WBC 8.8 8.9  HGB 12.6* 12.0*  HCT 40.7 38.0*  MCV 89.6 87.6  PLT 256 243   Basic Metabolic Panel Recent Labs    41/32/44 1235 07/31/23 0256  NA 133* 134*  K 4.2 4.1  CL 101 100  CO2 25 26  GLUCOSE 178* 108*  BUN 25* 26*  CREATININE 0.95 1.35*  CALCIUM 8.2* 8.1*  MG 1.2* 1.3*   Liver Function Tests Recent Labs    07/30/23 0253 07/30/23 1235  AST 31 30  ALT 20 18  ALKPHOS 177* 174*  BILITOT 0.9 1.0  PROT 8.1 8.1  ALBUMIN 1.8* 1.8*   No results for input(s): "LIPASE", "AMYLASE" in the last 72  hours. Cardiac Enzymes No results for input(s): "CKTOTAL", "CKMB", "CKMBINDEX", "TROPONINI" in the last 72 hours.  BNP: BNP (last 3 results) Recent Labs    07/09/23 1353 07/24/23 1451 07/30/23 0044  BNP 1,441.0* 1,670.4* 2,303.1*    ProBNP (last 3 results) No results for input(s): "PROBNP" in the last 8760 hours.   D-Dimer No results for input(s): "DDIMER" in the last 72 hours. Hemoglobin A1C No results for input(s): "HGBA1C" in the last 72 hours. Fasting Lipid Panel No results for input(s): "CHOL", "HDL", "LDLCALC", "TRIG", "CHOLHDL", "LDLDIRECT" in the last 72 hours. Thyroid Function Tests No results for input(s): "TSH", "T4TOTAL", "T3FREE", "THYROIDAB" in the last 72 hours.  Invalid input(s): "FREET3"  Other results:   Imaging    MR FOOT LEFT WO CONTRAST Result Date: 07/31/2023 CLINICAL DATA:  Heel wound EXAM: MRI OF THE LEFT HINDFOOT (ANKLE) WITHOUT CONTRAST TECHNIQUE: Multiplanar, multisequence MR imaging of the ankle was performed. No intravenous contrast was administered. COMPARISON:  07/30/2023 FINDINGS: TENDONS Peroneal: Unremarkable Posteromedial: Mild tibialis posterior tenosynovitis. Mild flexor hallucis longus tenosynovitis. Anterior: Unremarkable Achilles: Unremarkable Plantar Fascia: Plantar ulceration underlying the plantar fascia, with severe thickening and indistinctness of the medial band of the plantar fascia which may be ruptured  in the vicinity of the ulcer. LIGAMENTS Lateral: Unremarkable Medial: Unremarkable CARTILAGE Ankle Joint: Unremarkable Subtalar Joints/Sinus Tarsi: Unremarkable. Incidental large os trigonum. Bones: Abnormal patchy marrow edema in the calcaneus especially posteriorly in the vicinity of the ulceration, concerning for early osteomyelitis. This is especially notable in the vicinity of the plantar calcaneal spur. Other: Deep plantar ulceration just below the plantar calcaneal spur and medial band of the plantar fascia as noted above.  IMPRESSION: 1. Deep plantar ulceration underlying the plantar fascia, with severe thickening and indistinctness of the medial band of the plantar fascia which may be ruptured in the vicinity of the ulcer. 2. Abnormal patchy marrow edema in the calcaneus especially posteriorly in the vicinity of the ulceration, concerning for early osteomyelitis. 3. Mild tibialis posterior and flexor hallucis longus tenosynovitis. Electronically Signed   By: Gaylyn Rong M.D.   On: 07/31/2023 08:52   MR FOOT RIGHT WO CONTRAST Result Date: 07/31/2023 CLINICAL DATA:  Transmetatarsal amputation, suspicion for osteomyelitis EXAM: MRI OF THE RIGHT HINDFOOT WITHOUT CONTRAST TECHNIQUE: Multiplanar, multisequence MR imaging of the right hindfoot was performed. No intravenous contrast was administered. The patient refused IV contrast. COMPARISON:  Radiographs 07/30/2023 FINDINGS: Bones/Joint/Cartilage Remote transmetatarsal amputation. Marrow edema in the remaining diaphysis and proximal metaphysis of the second metatarsal suspicious for osteomyelitis. Patchy marrow edema in the navicular, cuboid, and lateral cuneiform proximally, potentially from stress reaction/arthropathy or early osteomyelitis. Trace edema distally in the remaining diaphysis of the third metatarsal suspicious for low-grade osteomyelitis. 6 mm non-fragmented osteochondral lesion along the lateral talar dome. Ligaments Lisfranc ligament intact. Lateral ligamentous complex and deltoid ligament grossly intact. Muscles and Tendons Diffuse low-level edema and regional musculature, likely neurogenic. Soft tissues No drainable abscess. Cutaneous thickening/irregularity along the amputation site with some speckled low signal intensity along the plantar margin of the amputation site for example on image 30 series 7 suggesting small amount of gas in the soft tissues, although poorly corroborated in other imaging planes. IMPRESSION: 1. Remote transmetatarsal amputation. 2.  Marrow edema in the remaining diaphysis and proximal metaphysis of the second metatarsal suspicious for osteomyelitis. 3. Patchy marrow edema in the navicular, cuboid, and lateral cuneiform proximally, potentially from stress reaction/arthropathy or early osteomyelitis. 4. Trace edema distally in the remaining diaphysis of the third metatarsal suspicious for low-grade osteomyelitis. 5. 6 mm non-fragmented osteochondral lesion along the lateral talar dome. 6. Diffuse low-level edema in regional musculature, likely neurogenic. 7. Cutaneous thickening/irregularity along the amputation site with some speckled low signal intensity along the plantar margin of the amputation site suggesting small amount of gas in the soft tissues, although poorly corroborated in other imaging planes. Electronically Signed   By: Gaylyn Rong M.D.   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 High Risk Factors: Hypertension, Diabetes, past history of smoking.  Limitations: Today's exam was limited due to involuntary patient movement. Comparison Study: No significant changes seen since prior exam 12/28/22 Performing Technologist: Shona Simpson  Examination Guidelines: A complete evaluation includes at minimum, Doppler waveform signals  and systolic blood pressure reading at the level of bilateral brachial, anterior tibial, and posterior tibial arteries, when vessel segments are accessible. Bilateral testing is considered an integral part of a complete  examination. Photoelectric Plethysmograph (PPG) waveforms and toe systolic pressure readings are included as required and additional duplex testing as needed. Limited examinations for reoccurring indications may be performed as noted.  ABI Findings: +--------+------------------+-----+-----------+--------+ Right   Rt Pressure (mmHg)IndexWaveform   Comment  +--------+------------------+-----+-----------+--------+ [id number]                    triphasic           +--------+------------------+-----+-----------+--------+ PTA     165               1.20 multiphasic         +--------+------------------+-----+-----------+--------+ DP      145               1.05 monophasic          +--------+------------------+-----+-----------+--------+ +--------+------------------+-----+-----------+-------+ Left    Lt Pressure (mmHg)IndexWaveform   Comment +--------+------------------+-----+-----------+-------+ [id number]                    triphasic          +--------+------------------+-----+-----------+-------+ PTA     159               1.15 multiphasic        +--------+------------------+-----+-----------+-------+ DP      143               1.04 monophasic         +--------+------------------+-----+-----------+-------+ +-------+-----------+-----------+------------+------------+ ABI/TBIToday's ABIToday's TBIPrevious ABIPrevious TBI +-------+-----------+-----------+------------+------------+ Right  1.2                   1.1                      +-------+-----------+-----------+------------+------------+ Left   1.15                  1.02                     +-------+-----------+-----------+------------+------------+  Bilateral ABIs appear increased compared to prior study on 12/28/22. Unable to obtain TBI due to great toe amputation.  Summary: Right: Resting right ankle-brachial index is within normal range. Unable to obtain TBI due to multiple toe amputations. Left: Resting  left ankle-brachial index is within normal range. *See table(s) above for measurements and observations.  Electronically signed by Lemar Livings MD on 07/30/2023 at 6:54:41 PM.    Final      Medications:     Scheduled Medications:  Chlorhexidine Gluconate Cloth  6 each Topical Daily   diclofenac Sodium  2 g Topical QID   enoxaparin (LOVENOX) injection  40 mg Subcutaneous Q24H   insulin aspart  0-15 Units Subcutaneous TID WC   insulin aspart  0-5 Units Subcutaneous QHS   insulin glargine-yfgn  20 Units Subcutaneous Daily   magnesium chloride  1 tablet Oral Daily   melatonin  3 mg Oral QHS   nystatin cream  1 Application Topical BID   sodium chloride flush  10 mL Intravenous Q12H   spironolactone  25 mg Oral Daily    Infusions:  sodium chloride 10 mL/hr at 07/31/23 0830   ceFEPime (MAXIPIME) IV 2 g (07/31/23 0510)   furosemide 120 mg (07/30/23 1816)   magnesium sulfate bolus IVPB 4 g (07/31/23 0832)   vancomycin 1,250  mg (07/30/23 2012)    PRN Medications: sodium chloride, acetaminophen **OR** acetaminophen, mouth rinse, senna-docusate    Patient Profile  Dakota Ramirez is a 52 y.o. male w/ chronic systolic heart failure w/ severe biventricular dysfunction due to NICM, uncontrolled T2DM, chronic LE wounds/ chronic osteomyelitis s/p left toe amputation, rt TMA and poor compliance. AHF team to see for acute on chronic systolic heart failure and cardiogenic shock.    Assessment/Plan  Acute on chronic systolic heart failure with cardiogenic shock - NICM. Reports hx CHF in his mother.  - R/LHC (4/23): showed normal cors, severe NICM EF 20-25% and mildly elevated filling pressures w/ normal CO - cMRI (4/24): showed severe biventricular failure, LVEF 17%. RVEF 22% LGE imaging very poor quality, which limits interpretation. Appears to have RV insertion site LGE (associated with worse prognosis) - Echo (5/24): EF < 20%, LV with GHK, RV mildly reduced, LA/RA sev dilated, mild-mod MR, mild  TR, trivial pericardial effusion.  - Echo 11/24: EF 20-25%, RV mildly reduced, mild MR - NYHA IV on admission. SCAI stage B. Suspect exacerbation 2/2 noncompliance and drug use.  - Lactic acid 3.1>2.5>2.1.  - Not sure that inotrope support would provide much benefit. He would not be a candidate for home inotrope and worry he is approaching need for hospice. - Diuresis not optimal on high dose IV lasix. Give 120 mg lasix IV now then start lasix gtt at 15/hr. May need metolazone if UOP not pick up this afternoon. - Supp K and Mag aggressively - Continue spironolactone 25 mg daily  - No SGLT2i with uncontrolled DM and concern for hygiene. - Hold BB with volume overload and cardiogenic shock.   - Hold UNNA boots with chronic BLE wounds - He would benefit from digoxin but I worry about compliance  - Not candidate for ICD with ongoing LE wounds/infection.  - Not a candidate for advanced therapies given poor compliance and co morbidities. May need to consider palliative consult for goals of care discussions.   Chronic foot wounds/osteomyelitis - Treated for cellulitis/osteomyelitis recent admit. Seen by ID during recent admit. Concerned for culture neg endocarditis. He left AMA. Discharged with course of po cefadroxil and linezolid. Not candidate for home PICC line. - MRI this admit with concern for osteomyelitis involving both lower extremities. Continue cefepime and vanc per primary team/ID   Poor med compliance/SDOH - lives in a camper behind his fathers house or in his cousin's basement - declined paramedicine - high risk for frequent readmits - Consider palliative care consult as above   Length of Stay: 1  Brach Birdsall N, PA-C  07/31/2023, 10:31 AM  Advanced Heart Failure Team Pager [phone] (M-F; 7a - 5p)  Please contact [facility] for night-coverage after hours (5p -7a ) and weekends on [email or web address]

## 2023-07-31 NOTE — Progress Notes (Signed)
Physical Therapy Treatment Patient Details Name: Dakota Ramirez MRN: [id number] DOB: [birth year] Today's Date: 07/31/2023   History of Present Illness 52 y.o. male [facility] hospital on 07/30/2023 with LE swelling and SOB. Pt recently admitted 11/13 for septic shock 2/2 osteomyelitis of foot, left AMA on 11/18. Pt admitted for management of acute on chronic systolic heart failure. 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    PT Comments  Pt is slowly progressing towards goals. He continues with difficulty with mobility due to swelling in the scrotum with an extremely wide BOS and short short steps. Pt has very low foot clearance and is unstable due to posturing in standing. Pt continues with fluid on abdomen as well which makes body habitus large and unwieldy for pt to move fluidly. Pt continues at Min A for sit to stand and transfers. CGA for bed mobility this session. Due to pt current functional status, home set up and available assistance at home recommending skilled physical therapy services 3x/week in order to address strength, balance and functional mobility to decrease risk for falls, injury and re-hospitalization.      If plan is discharge home, recommend the following: A little help with walking and/or transfers;Assistance with cooking/housework;Assist for transportation;Direct supervision/assist for medications management;Direct supervision/assist for financial management     Equipment Recommendations  Rollator (4 wheels)       Precautions / Restrictions Precautions Precautions: Fall Precaution Comments: significant BLE and abdominal edema, bilateral foot wounds, RLE transmet, Restrictions Weight Bearing Restrictions Per Provider Order: No     Mobility  Bed Mobility Overal bed mobility: Needs Assistance Bed Mobility: Supine to Sit     Supine to sit: Contact guard      General bed mobility comments: increased time, multiple attempts getting trunk to mid line prior to sitting EOB.    Transfers Overall transfer level: Needs assistance Equipment used: 1 person hand held assist Transfers: Sit to/from Stand Sit to Stand: Min assist           General transfer comment: Wide BOS due to swelling in the scrotum.    Ambulation/Gait       Pre-gait activities: steps from EOB to recliner. very wide BOS due to swelling in the scrotum, with UE support on physical therapist and on IV pole with short quick steps to sit in recliner.          Balance Overall balance assessment: Needs assistance Sitting-balance support: No upper extremity supported, Feet supported Sitting balance-Leahy Scale: Fair     Standing balance support: During functional activity, Bilateral upper extremity supported, Single extremity supported Standing balance-Leahy Scale: Fair Standing balance comment: Pt with hand on therapist and IV pole while standing and taking steps        Cognition Arousal: Alert Behavior During Therapy: WFL for tasks assessed/performed Overall Cognitive Status: Within Functional Limits for tasks assessed             General Comments General comments (skin integrity, edema, etc.): Pt HR remains elevated 100-110 during activity and O2 sats WNL, small open areas ~1 cm on forehead and bridge of nose noted and various areas on trunk.      Pertinent Vitals/Pain Pain Assessment Pain Assessment: Faces Faces Pain Scale: Hurts even more Pain Location: ribs Pain Descriptors / Indicators: Aching Pain Intervention(s): Monitored during session     PT Goals (current goals can now be found  in the care plan section) Acute Rehab PT Goals Patient Stated Goal: to reduce swelling and improve activity tolerance PT Goal Formulation: With patient Time For Goal Achievement: 08/13/23 Potential to Achieve Goals: Fair Progress towards PT goals: Progressing toward  goals    Frequency    Min 1X/week      PT Plan  Continue with current POC        AM-PAC PT "6 Clicks" Mobility   Outcome Measure  Help needed turning from your back to your side while in a flat bed without using bedrails?: A Little Help needed moving from lying on your back to sitting on the side of a flat bed without using bedrails?: A Little Help needed moving to and from a bed to a chair (including a wheelchair)?: A Little Help needed standing up from a chair using your arms (e.g., wheelchair or bedside chair)?: A Little Help needed to walk in hospital room?: Total Help needed climbing 3-5 steps with a railing? : Total 6 Click Score: 14    End of Session Equipment Utilized During Treatment: Gait belt Activity Tolerance: Patient limited by fatigue;Patient limited by pain;Other (comment) (pain and swelling in scrotum) Patient left: in chair;with call bell/phone within reach Nurse Communication: Mobility status PT Visit Diagnosis: Other abnormalities of gait and mobility (R26.89);Muscle weakness (generalized) (M62.81)     Time: 9562-1308 PT Time Calculation (min) (ACUTE ONLY): 16 min  Charges:    $Therapeutic Activity: 8-22 mins PT General Charges $$ ACUTE PT VISIT: 1 Visit                    Harrel Carina, DPT, CLT  Acute Rehabilitation Services Office: [phone] (Secure chat preferred)    Claudia Desanctis 07/31/2023, 12:48 PM

## 2023-07-31 NOTE — Progress Notes (Addendum)
HD#1 SUBJECTIVE:  Patient Summary: Dakota Ramirez is a 52 y.o. with a pertinent PMH of heart failure with reduced ejection fraction, hypertension, uncontrolled diabetes, who presented with concerns for generalized body swelling and shortness of breath and admitted for acute on chronic heart failure.   Overnight Events: None  Interim History: Pt with no acute concerns. Notes being hungry and somewhat warm in bed. No dyspnea, no pain or discomfort, no fevers.  OBJECTIVE:  Vital Signs: Vitals:   07/30/23 1911 07/31/23 0026 07/31/23 0400 07/31/23 0810  BP: (!) 127/100 116/87 (!) 122/93 (!) 121/94  Pulse: (!) 130 (!) 130 (!) 125 (!) 124  Resp: 17 16 19 19   Temp: 97.6 F (36.4 C) 98 F (36.7 C) 97.7 F (36.5 C) 97.7 F (36.5 C)  TempSrc: Oral Axillary Oral Oral  SpO2: 100% 98% 98% 100%  Weight:   (!) 139.3 kg   Height:       Supplemental O2: Room Air SpO2: 100 %  Filed Weights   07/30/23 0033 07/30/23 1601 07/31/23 0400  Weight: (!) 139.8 kg (!) 140 kg (!) 139.3 kg     Intake/Output Summary (Last 24 hours) at 07/31/2023 0920 Last data filed at 07/31/2023 0857 Gross per 24 hour  Intake 1169.89 ml  Output 2475 ml  Net -1305.11 ml   Net IO Since Admission: -395.11 mL [07/31/23 0920]  Physical Exam: Physical Exam Constitutional:      General: He is not in acute distress.    Appearance: He is well-developed. He is obese. He is not ill-appearing.  Cardiovascular:     Rate and Rhythm: Regular rhythm. Tachycardia present.  Pulmonary:     Effort: Pulmonary effort is normal.     Breath sounds: Normal breath sounds. No rhonchi or rales.  Abdominal:     Tenderness: There is no abdominal tenderness. There is no guarding.     Comments: Anasarca present. Abdomen remains tight but not tympanic. No pain.  Musculoskeletal:     Right lower leg: No tenderness. Edema present.     Left lower leg: No tenderness. Edema present.     Comments: Non-pitting edema. Barky skin. L calcaneus  wound. R transmetatarsal amputation. Grossly stable from yesterday.  Skin:    General: Skin is warm and dry.  Neurological:     General: No focal deficit present.     Mental Status: He is alert and oriented to person, place, and time.  Psychiatric:        Mood and Affect: Mood normal.        Behavior: Behavior normal.     Patient Lines/Drains/Airways Status     Active Line/Drains/Airways     Name Placement date Placement time Site Days   Peripheral IV 07/30/23 20 G Left Forearm 07/30/23  0255  Forearm   1   Urethral Catheter 07/30/23  1900  --  1   Wound / Incision (Open or Dehisced) 12/26/22 Diabetic ulcer Toe (Comment  which one) Anterior;Left 12/26/22  1838  Toe (Comment  which one)  217   Wound / Incision (Open or Dehisced) 12/26/22 Other (Comment) Pretibial Left;Lateral 12/26/22  1839  Pretibial  217   Wound / Incision (Open or Dehisced) 12/26/22 Other (Comment) Pretibial Right 12/26/22  1839  Pretibial  217   Wound / Incision (Open or Dehisced) 12/26/22 Other (Comment) Pretibial Proximal;Right 12/26/22  1840  Pretibial  217   Wound / Incision (Open or Dehisced) 12/26/22 Other (Comment) Heel Left 12/26/22  1841  Heel  217   Wound / Incision (Open or Dehisced) 06/26/23 Diabetic ulcer Heel Left Stage 4 ulcer 06/26/23  0330  Heel  35   Wound / Incision (Open or Dehisced) 06/26/23 Amputation Foot Anterior;Right 06/26/23  0330  Foot  35   Wound / Incision (Open or Dehisced) 07/30/23 Diabetic ulcer Heel Left;Posterior 07/30/23  1734  Heel  1   Wound / Incision (Open or Dehisced) 07/30/23 (ITD) Intertriginous Dermatitis Groin Right 07/30/23  1735  Groin  1             ASSESSMENT/PLAN:  Assessment: Principal Problem:   Acute on chronic systolic heart failure (HCC) Active Problems:   Acute on chronic congestive heart failure (HCC)   Acute on chronic heart failure (HCC)   Plan: Acute on Chronic Systolic Heart Failure  Day 2 of diuresis. Yesterday received 120 IV lasix x2  with net output 1.8L, suboptimal. Presently he is perfusing well - extremities appropriately warm and mentation intact. He feels well, no dyspnea, believes his swelling in abdomen is somewhat improved. He does report being very warm, but he is afebrile. Medication adherence is likely cause of this exacerbation. Current weight is 306lbs, dry weight is 250lbs. Most recent ECHO 08/13/22 revealed EF 20-25%. Cardiology remains involved.   - Cardiology following, thank you - Lasix drip starting today - Continue spironolactone 25 qd, hold beta blockers - TTE pending - Mg > 2 and K > 4, replete both today - Daily weights, Ins/Outs, goal weight 250lbs   Sepsis rule out Worsening chronic wound infection with chronic Osteomyelitis Inguinal fold intertrigo MRI suggests acute osteomyelitis. Per discussion with patient, he does not want to pursue surgery, is willing to do antibiotics. Pt is meant to be on chronic antibiotics for chronic osteomyelitis, but non-adherent (Patient was given two week course of linezolid and cefadroxil at time of [facility] discharge in November). 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 He had recent septic osteomyelitis in November, but no surgical indication at that time. Intertrigo is present in inguinal folds bilaterally. - Blood cultures no growth at 24 hours - Vanc and Cefepime - MRI bilateral feet reveals possible early osteomyelitis of L calcaneus, R 2nd/3rd metatarsals and possible midfoot vs arthropathy - Nystatin powder BID for inguinal intertrigo.   Anasarca, Scrotal Edema Severe scrotal edema. Appreciate urology's assistance with catheter placement. - Scrotal sling present - US scrotum with possible L hydrocele   Uncontrolled Type 2 Diabetes Recent A1c ws 10.3. Likely inconsistent with medicine use. Home regiment is novolog 30 BID. - Semglee 30 daily and SSI   Hypertension Pressures stable. Holding home coreg 6.25 BID. Continue home  spironolactone.   Hypoalbuminemia and Protein-calorie malnutrition - RD consult   Best Practice: Diet: Heart with fluid restrict IVF: None VTE: enoxaparin (LOVENOX) injection 40 mg Start: 07/30/23 1000 Code: Full AB: Cefepime, Vancomycin DISPO: Anticipated discharge in 5 days to Home pending  Cardiovascular decongestion .  Signature: Katheran James, D.O.  Internal Medicine Resident, PGY-1 [facility]  Pager: [phone] 9:20 AM, 07/31/2023   Please contact the on call pager after 5 pm and on weekends at [phone].

## 2023-07-31 NOTE — Progress Notes (Signed)
Initial Nutrition Assessment  DOCUMENTATION CODES:   Not applicable  INTERVENTION:  Continue 2 gram sodium diet as ordered 30 ml ProSource Plus TID, each supplement provides 100 kcals and 15 grams protein.  Juven BID to support wound healing Magic cup TID with meals, each supplement provides 290 kcal and 9 grams of protein MVI with minerals daily  NUTRITION DIAGNOSIS:   Increased nutrient needs related to wound healing as evidenced by estimated needs.  GOAL:   Patient will meet greater than or equal to 90% of their needs  MONITOR:   PO intake, Supplement acceptance, Labs, Weight trends, I & O's  REASON FOR ASSESSMENT:   Consult Assessment of nutrition requirement/status, Wound healing  ASSESSMENT:   Pt admitted with increased abdominal distension and fluid overload d/t acute on chronic systolic heart failure and cardiogenic shock. 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  Attempted to speak with pt at bedside. Pt resting soundly at time of visit. No family/visitors present. RN report pt frustrated with current fluid restriction. Unable to obtain further detailed nutrition related history from pt at this time.   Noted MRI this admit with concern for osteomyelitis involving both lower extremities continue with abx. Considering PMT consult for GOC given multi co-morbidities.   Per MD note, pt about 50 lbs above dry weight. Goal weight 250 lbs.  Admit weight: 139.8 kg Current weight: 139.3 kg  Reviewed weight documentation on file. Pt's weight noted to have been about 115 kg until 07/15 when his weight appeared to have begun trending up. Since that time pt's weight is +23.5 kg.   + edema: deep pitting BLE, perineal; moderate pitting generalized  Medications: SSI 0-15 units TID, SSI 0-5 units at bedtime, semglee 20 units daily, magnesium chloride  daily, melatonin, abx, IV lasix 120mg  BID  Labs:  sodium 134 BUN 26 Cr 1.35 Mg 1.3 CBG's 125-243 x24 hours HgbA1c 10.3% (06/26/23)  UOP: x24 hours  NUTRITION - FOCUSED PHYSICAL EXAM: Deferred to follow up.   Diet Order:   Diet Order             Diet 2 gram sodium Room service appropriate? Yes; Fluid consistency: Thin; Fluid restriction: 1200 mL Fluid  Diet effective now                   EDUCATION NEEDS:  No education needs have been identified at this time  Skin:  Skin Assessment: Skin Integrity Issues: Skin Integrity Issues:: Diabetic Ulcer, Other (Comment) Diabetic Ulcer: L heel Other: ITD R groing; open wound to R anterior foot  Last BM:  12/17 type 1 small  Height:  Ht Readings from Last 1 Encounters:  07/30/23 5\' 11"  (1.803 m)    Weight:  Wt Readings from Last 1 Encounters:  07/31/23 (!) 139.3 kg   Ideal Body Weight:  78.2 kg  BMI:  Body mass index is 42.83 kg/m.  Estimated Nutritional Needs:  Kcal:  2200-2400 Protein:  115-130g Fluid:  >/=2L  Drusilla Kanner, RDN, LDN Clinical Nutrition

## 2023-07-31 NOTE — Progress Notes (Signed)
Notified Dr. Ninfa Meeker and Dr. Ned Card that patient repetitively states "I can't breathe". O2 sats staying between 98-100%. Rapid Response RN called. RRT recommends requesting anxiety medication. Also notified MDs that thick, white liquid appearing to be pus comes out from around catheter while performing peri/foley care. MDs up to assess patient. No new orders received.

## 2023-07-31 NOTE — Progress Notes (Signed)
  Echocardiogram 2D Echocardiogram has been performed.  Dakota Ramirez 07/31/2023, 2:21 PM

## 2023-07-31 NOTE — TOC Progression Note (Signed)
Transition of Care [facility]) - Progression Note    Patient Details  Name: Dakota Ramirez MRN: [id number] Date of Birth: 01/12/1971  Transition of Care [facility]) CM/SW Contact  Leone Haven, RN Phone Number: 07/31/2023, 4:16 PM  Clinical Narrative:    NCM spoke with patient at the bedside regarding rollator and [facility] services, could not understand what he was saying.   NCM informed him will check back tomorrow. Left agnecy list at the bedside.    Expected Discharge Plan: Home/Self Care Barriers to Discharge: Continued Medical Work up  Expected Discharge Plan and Services       Living arrangements for the past 2 months: Mobile Home                                       Social Determinants of Health (SDOH) Interventions SDOH Screenings   Food Insecurity: Food Insecurity Present (12/28/2022)  Housing: Patient Declined (12/28/2022)  Transportation Needs: No Transportation Needs (11/29/2021)  Utilities: Not At Risk (12/28/2022)  Depression (PHQ2-9): Low Risk  (01/17/2023)  Recent Concern: Depression (PHQ2-9) - Medium Risk (01/10/2023)  Financial Resource Strain: High Risk (01/10/2023)  Social Connections: Unknown (12/15/2021)   Received from [facility], [facility]  Stress: No Stress Concern Present (02/28/2021)   Received from [facility], [facility]  Tobacco Use: Low Risk  (07/30/2023)    Readmission Risk Interventions     No data to display

## 2023-07-31 NOTE — Consult Note (Signed)
Value-Based Care [facility] Liaison Consult Note    07/31/2023  [name] 22-Mar-1971 [id number]  Insurance: [name] Medicaid also noted]   Primary Care Provider: Annett Fabian, MD with [facility] Internal Medicine, this provider is listed for the transition of care follow up appointments  and [facility] calls   [facility] Liaison came to the bedside and is having a review with Pharmacy and nursing at bedside.      The patient was screened for 30 day readmission hospitalization with noted high risk score for unplanned readmission risk 2 hospital admissions in 6 months.  The patient was assessed for potential Community Care Coordination service needs for post hospital transition for care coordination. Review of patient's electronic medical record reveals patient listed as a research patient listed with Advanced HF team noted.   Plan: [facility] Liaison will continue to follow progress and disposition to asess for post hospital community care coordination/management needs.  Referral request for community care coordination: pending disposition and needs.   VBCI Community Care, Population Health does not replace or interfere with any arrangements made by the Inpatient Transition of Care team.   For questions contact:   Charlesetta Shanks, RN, BSN, CCM [place]  [facility] Liaison Direct Dial: [phone] or secure chat Email: [email or web address]

## 2023-07-31 NOTE — Plan of Care (Signed)
  Problem: Education: Goal: Ability to describe self-care measures that may prevent or decrease complications (Diabetes Survival Skills Education) will improve Outcome: Progressing Goal: Individualized Educational Video(s) Outcome: Progressing   

## 2023-07-31 NOTE — Plan of Care (Signed)
  Problem: Metabolic: Goal: Ability to maintain appropriate glucose levels will improve Outcome: Progressing   Problem: Nutritional: Goal: Progress toward achieving an optimal weight will improve Outcome: Progressing   Problem: Skin Integrity: Goal: Risk for impaired skin integrity will decrease Outcome: Progressing   Problem: Tissue Perfusion: Goal: Adequacy of tissue perfusion will improve Outcome: Progressing   Problem: Clinical Measurements: Goal: Cardiovascular complication will be avoided Outcome: Progressing   Problem: Pain Management: Goal: General experience of comfort will improve Outcome: Progressing   Problem: Safety: Goal: Ability to remain free from injury will improve Outcome: Progressing   Problem: Skin Integrity: Goal: Risk for impaired skin integrity will decrease Outcome: Progressing   Problem: Education: Goal: Ability to demonstrate management of disease process will improve Outcome: Progressing Goal: Ability to verbalize understanding of medication therapies will improve Outcome: Progressing   Problem: Activity: Goal: Capacity to carry out activities will improve Outcome: Progressing   Problem: Cardiac: Goal: Ability to achieve and maintain adequate cardiopulmonary perfusion will improve Outcome: Progressing

## 2023-07-31 NOTE — TOC Initial Note (Signed)
Transition of Care [facility]) - Initial/Assessment Note    Patient Details  Name: Dakota Ramirez MRN: [id number] Date of Birth: 10-06-1970  Transition of Care [facility]) CM/SW Contact:    Nicanor Bake Phone Number: [phone] 07/31/2023, 11:55 AM  Clinical Narrative:     HF CSW met with pt at bedside. Pt stated that he was hot and frustrated. Pt stated that he lives alone. Pt stated that he has a past history of HH services. Pt stated that he does not have a scale. Pt stated that he uses a walking stick. Pt stated that he did not go to his last PCP visit after dc. CSW explained the importance of the pt going to his hospital follow up appointment with PCP.   TOC will continue following.               Expected Discharge Plan: Home/Self Care Barriers to Discharge: Continued Medical Work up   Patient Goals and CMS Choice            Expected Discharge Plan and Services       Living arrangements for the past 2 months: Mobile Home                                      Prior Living Arrangements/Services Living arrangements for the past 2 months: Mobile Home Lives with:: Self Patient language and need for interpreter reviewed:: Yes Do you feel safe going back to the place where you live?: Yes      Need for Family Participation in Patient Care: No (Comment) Care giver support system in place?: No (comment)   Criminal Activity/Legal Involvement Pertinent to Current Situation/Hospitalization: No - Comment as needed  Activities of Daily Living      Permission Sought/Granted                  Emotional Assessment Appearance:: Appears older than stated age Attitude/Demeanor/Rapport: Inconsistent, Engaged Affect (typically observed): Appropriate Orientation: : Oriented to Self, Oriented to Place, Oriented to  Time, Oriented to Situation Alcohol / Substance Use: Not Applicable Psych Involvement: No (comment)  Admission diagnosis:  Acute on chronic heart failure  (HCC) [I50.9] Acute on chronic congestive heart failure, unspecified heart failure type (HCC) [I50.9] Sepsis, due to unspecified organism, unspecified whether acute organ dysfunction present 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 01/10/2023   Diabetic osteomyelitis (HCC) 12/28/2022   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 to breakdown of skin (HCC) 12/28/2022   Severe protein-calorie malnutrition (HCC) 12/28/2022   Acute on chronic systolic heart failure (HCC) 12/28/2022   Elevated alkaline phosphatase level 12/27/2022   Acute on chronic congestive heart failure (HCC) 12/26/2022   Hyponatremia 11/29/2021   PVD (peripheral vascular disease) (HCC) 11/29/2021   Class 2 obesity 11/29/2021   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) 11/26/2021   Type 2 diabetes mellitus with foot ulcer, with long-term current use of insulin (HCC) 11/26/2021  Essential hypertension 11/26/2021   PCP:  Annett Fabian, MD Pharmacy:   [facility] [address], [place] - [address] [place] [address] Phone: [phone] Fax: [phone]  [facility] [place], [place] - [address] [place] [address] Phone: [phone] Fax: [phone]  [facility] [facility] [address] [place] [address] Phone: [phone] Fax: [phone]  [facility] [address] [place] [address] Phone: [phone] Fax: [phone]     Social Drivers of Health (SDOH) Social History: SDOH Screenings   Food Insecurity: Food Insecurity Present (12/28/2022)  Housing: Patient Declined (12/28/2022)  Transportation Needs: No Transportation Needs (11/29/2021)  Utilities: Not At Risk (12/28/2022)  Depression (PHQ2-9): Low Risk  (01/17/2023)  Recent Concern: Depression (PHQ2-9) - Medium Risk (01/10/2023)  Financial Resource Strain: High Risk (01/10/2023)  Social Connections: Unknown (12/15/2021)   [facility] from [facility]  Stress: No Stress Concern Present (02/28/2021)   Received from [facility], [facility]  Tobacco Use: Low Risk  (07/30/2023)   SDOH Interventions:     Readmission Risk Interventions     No data to display

## 2023-08-01 ENCOUNTER — Inpatient Hospital Stay (HOSPITAL_COMMUNITY): Admission: RE | Admit: 2023-08-01 | Payer: 59 | Source: Ambulatory Visit

## 2023-08-01 DIAGNOSIS — I5023 Acute on chronic systolic (congestive) heart failure: Secondary | ICD-10-CM | POA: Diagnosis not present

## 2023-08-01 DIAGNOSIS — I110 Hypertensive heart disease with heart failure: Secondary | ICD-10-CM | POA: Diagnosis not present

## 2023-08-01 LAB — CBC
HCT: 43.6 % (ref 39.0–52.0)
Hemoglobin: 13.4 g/dL (ref 13.0–17.0)
MCH: 27.2 pg (ref 26.0–34.0)
MCHC: 30.7 g/dL (ref 30.0–36.0)
MCV: 88.6 fL (ref 80.0–100.0)
Platelets: 260 10*3/uL (ref 150–400)
RBC: 4.92 MIL/uL (ref 4.22–5.81)
RDW: 18.1 % — ABNORMAL HIGH (ref 11.5–15.5)
WBC: 7.4 10*3/uL (ref 4.0–10.5)
nRBC: 0.3 % — ABNORMAL HIGH (ref 0.0–0.2)

## 2023-08-01 LAB — BASIC METABOLIC PANEL
Anion gap: 9 (ref 5–15)
Anion gap: 8 (ref 5–15)
BUN: 34 mg/dL — ABNORMAL HIGH (ref 6–20)
BUN: 38 mg/dL — ABNORMAL HIGH (ref 6–20)
CO2: 26 mmol/L (ref 22–32)
CO2: 25 mmol/L (ref 22–32)
Calcium: 8.4 mg/dL — ABNORMAL LOW (ref 8.9–10.3)
Calcium: 8.5 mg/dL — ABNORMAL LOW (ref 8.9–10.3)
Chloride: 98 mmol/L (ref 98–111)
Chloride: 99 mmol/L (ref 98–111)
Creatinine, Ser: 1.64 mg/dL — ABNORMAL HIGH (ref 0.61–1.24)
Creatinine, Ser: 1.36 mg/dL — ABNORMAL HIGH (ref 0.61–1.24)
GFR, Estimated: 50 mL/min — ABNORMAL LOW (ref >60)
GFR, Estimated: >60 mL/min (ref >60)
Glucose, Bld: 163 mg/dL — ABNORMAL HIGH (ref 70–99)
Glucose, Bld: 163 mg/dL — ABNORMAL HIGH (ref 70–99)
Potassium: 5 mmol/L (ref 3.5–5.1)
Potassium: 4.4 mmol/L (ref 3.5–5.1)
Sodium: 133 mmol/L — ABNORMAL LOW (ref 135–145)
Sodium: 132 mmol/L — ABNORMAL LOW (ref 135–145)

## 2023-08-01 LAB — GLUCOSE, CAPILLARY
Glucose-Capillary: 123 mg/dL — ABNORMAL HIGH (ref 70–99)
Glucose-Capillary: 186 mg/dL — ABNORMAL HIGH (ref 70–99)
Glucose-Capillary: 287 mg/dL — ABNORMAL HIGH (ref 70–99)
Glucose-Capillary: 103 mg/dL — ABNORMAL HIGH (ref 70–99)

## 2023-08-01 LAB — MAGNESIUM: Magnesium: 1.7 mg/dL (ref 1.7–2.4)

## 2023-08-01 MED ORDER — VANCOMYCIN HCL 1.5 G IV SOLR
1500.0000 mg | INTRAVENOUS | Status: DC
Start: 2023-08-02 — End: 2023-08-01

## 2023-08-01 MED ORDER — METOLAZONE 5 MG PO TABS
10.0000 mg | ORAL_TABLET | Freq: Two times a day (BID) | ORAL | Status: DC
  Administered 2023-08-01 (×2): 10 mg via ORAL
  Filled 2023-08-01 (×2): qty 2

## 2023-08-01 MED ORDER — SODIUM CHLORIDE 0.9 % IV SOLN
800.0000 mg | Freq: Every day | INTRAVENOUS | Status: DC
  Administered 2023-08-01 – 2023-08-03 (×3): 800 mg via INTRAVENOUS
  Filled 2023-08-01 (×4): qty 16

## 2023-08-01 MED ORDER — MAGNESIUM SULFATE 2 GM/50ML IV SOLN
2.0000 g | Freq: Once | INTRAVENOUS | Status: AC
  Administered 2023-08-01: 2 g via INTRAVENOUS
  Filled 2023-08-01: qty 50

## 2023-08-01 MED ORDER — LOSARTAN POTASSIUM 25 MG PO TABS
25.0000 mg | ORAL_TABLET | Freq: Every day | ORAL | Status: DC
  Administered 2023-08-01: 25 mg via ORAL
  Filled 2023-08-01: qty 1

## 2023-08-01 MED ORDER — METOLAZONE 5 MG PO TABS: 10.0000 mg | ORAL_TABLET | Freq: Once | ORAL | Status: DC

## 2023-08-01 NOTE — Progress Notes (Signed)
PHARMACY ANTIBIOTIC CONSULT NOTE   Dakota Ramirez a 52 y.o. male  with BL feet osteomyelitis and large L calcaneous ulcer. Hx of L great toe amputation in 01/2023, R TMA 02/2021.  Pharmacy has been consulted for Cefepime and daptomycin dosing.  Stop vancomycin given AKI, which is like caused by aggressive diuresis. Patient is refusing surgery so plan for long antibiotic course. Plan to switch to po at discharge.   Plan: Cefepime 2g q8hr  Stop vancomycin Daptomycin 8 mg/kg adjusted weight, rounded to 800mg  q24hr Monitor cultures, clinical status, renal function Narrow abx as able and f/u duration    Allergies:  No Known Allergies  Filed Weights   07/30/23 1601 07/31/23 0400 08/01/23 0034  Weight: (!) 140 kg (308 lb 10.3 oz) (!) 139.3 kg (307 lb 1.6 oz) (!) 142 kg (313 lb 0.9 oz)       Latest Ref Rng & Units 08/01/2023    2:57 AM 07/31/2023    2:56 AM 07/30/2023   12:44 AM  CBC  WBC 4.0 - 10.5 K/uL 7.4  8.9  8.8   Hemoglobin 13.0 - 17.0 g/dL 16.1  09.6  04.5   Hematocrit 39.0 - 52.0 % 43.6  38.0  40.7   Platelets 150 - 400 K/uL 260  243  256      Antimicrobials this admission: MTZ x1 12/17  Cefepime 12/17>> Vancomycin 12/17>>12/18 Dapto 12/19>>   Microbiology results: 12/17 Bcx: ngtd  Thank you for allowing pharmacy to be a part of this patient's care.  Alphia Moh, PharmD, BCPS, BCCP Clinical Pharmacist  Please check AMION for all [facility] Pharmacy phone numbers After 10:00 PM, call Main Pharmacy [phone]

## 2023-08-01 NOTE — Progress Notes (Addendum)
Mobility Specialist Progress Note:   08/01/23 1402  Mobility  Activity Transferred from bed to chair  Level of Assistance Minimal assist, patient does 75% or more  Assistive Device Other (Comment) (HHA)  Distance Ambulated (ft) 3 ft  Activity Response Tolerated fair  Mobility Referral Yes  Mobility visit 1 Mobility  Mobility Specialist Start Time (ACUTE ONLY) 1402  Mobility Specialist Stop Time (ACUTE ONLY) 1415  Mobility Specialist Time Calculation (min) (ACUTE ONLY) 13 min   Rn requesting assistance with transfer to chair d/t wet bed. Required minA via HHA to stand and pivot to chair. Pt grimacing in pain throughout transfer. Left with all needs met.   Addison Lank Mobility Specialist Please contact via SecureChat or  Rehab office at [phone]

## 2023-08-01 NOTE — TOC Progression Note (Addendum)
Transition of Care [facility]) - Progression Note    Patient Details  Name: Dakota Ramirez MRN: [id number] Date of Birth: 04/16/1971  Transition of Care [facility]) CM/SW Contact  Nicanor Bake Phone Number: [phone] 08/01/2023, 1:05 PM  Clinical Narrative:   HF CSW called and scheduled pts Hospital follow up appointment for Monday, August 05, 2023 at 1:15 PM.   CSW met with pt at bedside. Pt appeared to be anxious and sad. Pt was sitting in chair and stated the he was uncomfortable. CSW went to get the NT to assist the pt with transition from chair to bed. CSW provided pt with hospital follow up information.  TOC will continue following.     Expected Discharge Plan: Home/Self Care Barriers to Discharge: Continued Medical Work up  Expected Discharge Plan and Services       Living arrangements for the past 2 months: Mobile Home                                       Social Determinants of Health (SDOH) Interventions SDOH Screenings   Food Insecurity: Food Insecurity Present (12/28/2022)  Housing: Patient Declined (12/28/2022)  Transportation Needs: No Transportation Needs (11/29/2021)  Utilities: Not At Risk (12/28/2022)  Depression (PHQ2-9): Low Risk  (01/17/2023)  Recent Concern: Depression (PHQ2-9) - Medium Risk (01/10/2023)  Financial Resource Strain: High Risk (01/10/2023)  Social Connections: Unknown (12/15/2021)   [facility]  Stress: No Stress Concern Present (02/28/2021)   Received from [facility], [facility]  Tobacco Use: Low Risk  (07/30/2023)    Readmission Risk Interventions     No data to display

## 2023-08-01 NOTE — Consult Note (Signed)
WOC Nurse Consult Note: Consult requested for left heel and right foot wounds.  This was already performed on 12/17; please refer to previous progress notes for assessment and plan of care, topical treatment orders have been provided for bedside nurses to perform.  Please re-consult if further assistance is needed.  Thank-you,  Cammie Mcgee MSN, RN, CWOCN, [place], CNS [phone]

## 2023-08-01 NOTE — Progress Notes (Signed)
HD#2 SUBJECTIVE:  Patient Summary: Dakota Ramirez is a 52 y.o. with a pertinent PMH of heart failure with reduced ejection fraction, hypertension, uncontrolled diabetes, who presented with concerns for generalized body swelling and shortness of breath and admitted for acute on chronic heart failure.   Overnight Events: None  Interim History: Some anxiety yesterday, some subjective dyspnea (saturating well) relieved with oxygen for comfort, dislikes the needles in his arms, feels uncomfortable in the bed, some pain in legs due to swelling.  OBJECTIVE:  Vital Signs: Vitals:   07/31/23 1922 08/01/23 0034 08/01/23 0411 08/01/23 0756  BP: (!) 129/102 (!) 128/113 (!) 113/92 (!) 129/102  Pulse: (!) 123 (!) 122 (!) 117 (!) 122  Resp: 18 12 12 20   Temp: 98.2 F (36.8 C) (!) 97.4 F (36.3 C) (!) 97.5 F (36.4 C)   TempSrc: Oral Oral Oral   SpO2: 99% 100% 99% 99%  Weight:  (!) 142 kg    Height:       Supplemental O2: Room Air SpO2: 99 %  Filed Weights   07/30/23 1601 07/31/23 0400 08/01/23 0034  Weight: (!) 140 kg (!) 139.3 kg (!) 142 kg     Intake/Output Summary (Last 24 hours) at 08/01/2023 4034 Last data filed at 08/01/2023 7425 Gross per 24 hour  Intake 622.44 ml  Output 2750 ml  Net -2127.56 ml   Net IO Since Admission: -2,522.67 mL [08/01/23 0909]  Physical Exam: Physical Exam Constitutional:      General: He is not in acute distress.    Appearance: He is well-developed. He is obese. He is not ill-appearing.  Cardiovascular:     Rate and Rhythm: Normal rate and regular rhythm.  Pulmonary:     Effort: Pulmonary effort is normal.     Breath sounds: Decreased breath sounds present.  Abdominal:     Palpations: Abdomen is soft.     Tenderness: There is abdominal tenderness.     Comments: Anasarca  Musculoskeletal:     Right lower leg: Edema present.     Left lower leg: Edema present.     Comments: Woody nonpitting edema  Skin:    General: Skin is warm and dry.      Capillary Refill: Capillary refill takes less than 2 seconds.  Neurological:     General: No focal deficit present.     Mental Status: He is alert and oriented to person, place, and time.  Psychiatric:        Mood and Affect: Mood is anxious.        Behavior: Behavior normal.     Patient Lines/Drains/Airways Status     Active Line/Drains/Airways     Name Placement date Placement time Site Days   Peripheral IV 07/30/23 20 G Left Forearm 07/30/23  0255  Forearm   2   Peripheral IV 07/31/23 20 G 1.88" Anterior;Right Forearm 07/31/23  1212  Forearm  1   Urethral Catheter 07/30/23  1900  --  2   Wound / Incision (Open or Dehisced) 12/26/22 Diabetic ulcer Toe (Comment  which one) Anterior;Left 12/26/22  1838  Toe (Comment  which one)  218   Wound / Incision (Open or Dehisced) 12/26/22 Other (Comment) Pretibial Left;Lateral 12/26/22  1839  Pretibial  218   Wound / Incision (Open or Dehisced) 12/26/22 Other (Comment) Pretibial Right 12/26/22  1839  Pretibial  218   Wound / Incision (Open or Dehisced) 12/26/22 Other (Comment) Pretibial Proximal;Right 12/26/22  1840  Pretibial  218  Wound / Incision (Open or Dehisced) 12/26/22 Other (Comment) Heel Left 12/26/22  1841  Heel  218   Wound / Incision (Open or Dehisced) 06/26/23 Diabetic ulcer Heel Left Stage 4 ulcer 06/26/23  0330  Heel  36   Wound / Incision (Open or Dehisced) 06/26/23 Amputation Foot Anterior;Right 06/26/23  0330  Foot  36   Wound / Incision (Open or Dehisced) 07/30/23 Diabetic ulcer Heel Left;Posterior 07/30/23  1734  Heel  2   Wound / Incision (Open or Dehisced) 07/30/23 (ITD) Intertriginous Dermatitis Groin Right 07/30/23  1735  Groin  2             ASSESSMENT/PLAN:  Assessment: Principal Problem:   Acute on chronic systolic heart failure (HCC) Active Problems:   Acute on chronic congestive heart failure (HCC)   Acute on chronic heart failure (HCC)  Dakota Ramirez is a 52 y.o. with a pertinent PMH of heart failure  with reduced ejection fraction, hypertension, uncontrolled diabetes, who presented with concerns for generalized body swelling and shortness of breath and admitted for acute on chronic heart failure.   Plan: Acute on Chronic Systolic Heart Failure  Day 3 of diuresis. Heart failure team is following. Yesterday was changed to lasix drip 20/hr and metolazone, 2L output, about 4L total this hospitalization. Presently he is perfusing well. He is uncomfortable, anxious, swelling is painful. Medication adherence is likely cause of this exacerbation. Current weight is 313lbs (an increase since admission), dry weight is 250lbs. Pre-hospital ECHO 08/13/22 revealed EF 20-25%.   - Cardiology following, thank you - Suboptimal diuretic response with limited options for escalation - Creatinine increasing, now 1.6 from 1.3. Will change vancomycin to daptomycin in case there is contributing nephropathy - continue Lasix drip 20/hr and metolazone 10 BID  - Continue spironolactone 25 qd, hold beta blockers - TTE with EF < 10% - Mg > 2 and K > 4, replete Mg today - Daily weights, Ins/Outs, down 4L total, goal weight 250lbs, present weight 313, no improvement. - Approaching end stage disease, might need to involve palliative medicine   Sepsis rule out Worsening chronic wound infection with chronic Osteomyelitis Inguinal fold intertrigo MRI suggests acute osteomyelitis. Per discussion with patient, he does not want to pursue surgery, is willing to do antibiotics. Continue Cefepime, change Vanc do daptomycin per increase in creatinine. Pt is meant to be on chronic antibiotics for chronic osteomyelitis, but non-adherent (Patient was given two week course of linezolid and cefadroxil at time of [facility] discharge in November). 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 He had recent septic osteomyelitis in November, but no surgical indication at that time. Intertrigo is present in inguinal folds  bilaterally. - Blood cultures no growth at 48 hours - Daptomycin and Cefepime - MRI bilateral feet reveals possible early osteomyelitis of L calcaneus, R 2nd/3rd metatarsals and possible midfoot vs arthropathy - Nystatin powder BID for inguinal intertrigo.   Anasarca, Scrotal Edema Severe scrotal edema. Appreciate urology's assistance with catheter placement. - Scrotal sling present, continue - US scrotum with possible L hydrocele   Uncontrolled Type 2 Diabetes Recent A1c ws 10.3. Likely inconsistent with medicine use. Home regiment is novolog 30 BID. - Semglee 30 daily and SSI   Hypertension Pressures stable. Holding home coreg 6.25 BID. Continue home spironolactone.   Hypoalbuminemia and Protein-calorie malnutrition - RD consult   Best Practice: Diet: Heart with fluid restrict IVF: None VTE: enoxaparin (LOVENOX) injection 40 mg Start: 07/30/23  1000 Code: Full AB: Cefepime, Daptomycin DISPO: Anticipated discharge in 5 days to Home pending  Cardiovascular decongestion .  Signature: Katheran James, D.O.  Internal Medicine Resident, PGY-1 [facility]  Pager: [phone] 9:09 AM, 08/01/2023   Please contact the on call pager after 5 pm and on weekends at [phone].

## 2023-08-01 NOTE — Progress Notes (Addendum)
Advanced Heart Failure Rounding Note  Cardiologist: None   Subjective:    On lasix gtt at 20/hr + 10 mg metolazone. 2.8L UOP charted for the last 24 hrs. Weight trending up.   Scr 0.9>1.35>1.64  Feels like a "caged animal" given multiple IVs and lines. Didn't sleep well and extremities are cramping.  Legs and abdomen hurt from degree of swelling.  Objective:   Weight Range: (!) 142 kg Body mass index is 43.66 kg/m.   Vital Signs:   Temp:  [97.4 F (36.3 C)-98.2 F (36.8 C)] 97.5 F (36.4 C) (12/19 0411) Pulse Rate:  [117-124] 117 (12/19 0411) Resp:  [12-19] 12 (12/19 0411) BP: (113-129)/(92-113) 113/92 (12/19 0411) SpO2:  [99 %-100 %] 99 % (12/19 0411) Weight:  [142 kg] 142 kg (12/19 0034) Last BM Date : 07/30/23  Weight change: Filed Weights   07/30/23 1601 07/31/23 0400 08/01/23 0034  Weight: (!) 140 kg (!) 139.3 kg (!) 142 kg    Intake/Output:   Intake/Output Summary (Last 24 hours) at 08/01/2023 0736 Last data filed at 08/01/2023 0656 Gross per 24 hour  Intake 822.44 ml  Output 2750 ml  Net -1927.56 ml      Physical Exam  General:  Acute on chronically ill appearing HEENT: Multiple wounds in various stages of healing Neck: supple. Jvp to jaw.  Cor: PMI nondisplaced. Regular rate & rhythm, tachy. No rubs, gallops or murmurs. Lungs: diminished Abdomen: obese, distended Extremities: no cyanosis, clubbing, rash, 2+ edema to waist,  Neuro: alert & oriented x 3. moves all 4 extremities w/o difficulty. Affect irritable.   Telemetry   Sinus tach, 120s   Labs    CBC Recent Labs    07/31/23 0256 08/01/23 0257  WBC 8.9 7.4  HGB 12.0* 13.4  HCT 38.0* 43.6  MCV 87.6 88.6  PLT 243 260   Basic Metabolic Panel Recent Labs    96/04/54 0256 08/01/23 0257  NA 134* 133*  K 4.1 5.0  CL 100 98  CO2 26 26  GLUCOSE 108* 163*  BUN 26* 34*  CREATININE 1.35* 1.64*  CALCIUM 8.1* 8.4*  MG 1.3* 1.7   Liver Function Tests Recent Labs     07/30/23 0253 07/30/23 1235  AST 31 30  ALT 20 18  ALKPHOS 177* 174*  BILITOT 0.9 1.0  PROT 8.1 8.1  ALBUMIN 1.8* 1.8*   No results for input(s): "LIPASE", "AMYLASE" in the last 72 hours. Cardiac Enzymes No results for input(s): "CKTOTAL", "CKMB", "CKMBINDEX", "TROPONINI" in the last 72 hours.  BNP: BNP (last 3 results) Recent Labs    07/09/23 1353 07/24/23 1451 07/30/23 0044  BNP 1,441.0* 1,670.4* 2,303.1*    ProBNP (last 3 results) No results for input(s): "PROBNP" in the last 8760 hours.   D-Dimer No results for input(s): "DDIMER" in the last 72 hours. Hemoglobin A1C No results for input(s): "HGBA1C" in the last 72 hours. Fasting Lipid Panel No results for input(s): "CHOL", "HDL", "LDLCALC", "TRIG", "CHOLHDL", "LDLDIRECT" in the last 72 hours. Thyroid Function Tests No results for input(s): "TSH", "T4TOTAL", "T3FREE", "THYROIDAB" in the last 72 hours.  Invalid input(s): "FREET3"  Other results:   Imaging    ECHOCARDIOGRAM COMPLETE Result Date: 07/31/2023    ECHOCARDIOGRAM REPORT   Patient Name:   EMIR STROMMER Date of Exam: 07/31/2023 Medical Rec #:  [id number]    Height:       71.0 in Accession #:    [id number]   Weight:  307.1 lb Date of Birth:  [birth year]    BSA:          2.530 m Patient Age:    52 years     BP:           126/99 mmHg Patient Gender: M            HR:           120 bpm. Exam Location:  Inpatient Procedure: 2D Echo, Cardiac Doppler, Color Doppler and Intracardiac            Opacification Agent Indications:    R06.02 SOB  History:        Patient has prior history of Echocardiogram examinations, most                 recent 07/01/2023. CHF and Cardiomyopathy, Mitral Valve Disease,                 Signs/Symptoms:Bacteremia; Risk Factors:Diabetes.                 Methamphetamine use.  Sonographer:    Sheralyn Boatman RDCS Referring Phys: [phone] [facility]  Sonographer Comments: Technically difficult study due to poor echo windows and patient is obese.  Image acquisition challenging due to patient body habitus. IMPRESSIONS  1. Left ventricular ejection fraction, by estimation, is < 10%%. The left ventricle has severely decreased function. The left ventricle demonstrates global hypokinesis. The left ventricular internal cavity size was severely dilated. Left ventricular diastolic parameters are consistent with Grade III diastolic dysfunction (restrictive).  2. Right ventricular systolic function is severely reduced. The right ventricular size is severely enlarged. There is mildly elevated pulmonary artery systolic pressure. The estimated right ventricular systolic pressure is 43.5 mmHg.  3. Left atrial size was severely dilated.  4. Right atrial size was severely dilated.  5. A small pericardial effusion is present. The pericardial effusion is circumferential. There is no evidence of cardiac tamponade.  6. The mitral valve is normal in structure. Mild mitral valve regurgitation.  7. The aortic valve is tricuspid. Aortic valve regurgitation is not visualized. No aortic stenosis is present.  8. The inferior vena cava is dilated in size with <50% respiratory variability, suggesting right atrial pressure of 15 mmHg. FINDINGS  Left Ventricle: Left ventricular ejection fraction, by estimation, is < 10%%. The left ventricle has severely decreased function. The left ventricle demonstrates global hypokinesis. Definity contrast agent was given IV to delineate the left ventricular endocardial borders. The left ventricular internal cavity size was severely dilated. There is no left ventricular hypertrophy. Left ventricular diastolic parameters are consistent with Grade III diastolic dysfunction (restrictive). Right Ventricle: The right ventricular size is severely enlarged. No increase in right ventricular wall thickness. Right ventricular systolic function is severely reduced. There is mildly elevated pulmonary artery systolic pressure. The tricuspid regurgitant velocity is  2.67 m/s, and with an assumed right atrial pressure of 15 mmHg, the estimated right ventricular systolic pressure is 43.5 mmHg. Left Atrium: Left atrial size was severely dilated. Right Atrium: Right atrial size was severely dilated. Pericardium: A small pericardial effusion is present. The pericardial effusion is circumferential. There is no evidence of cardiac tamponade. Mitral Valve: The mitral valve is normal in structure. Mild mitral valve regurgitation. Tricuspid Valve: The tricuspid valve is normal in structure. Tricuspid valve regurgitation is mild. Aortic Valve: The aortic valve is tricuspid. Aortic valve regurgitation is not visualized. No aortic stenosis is present. Pulmonic Valve: The pulmonic valve was grossly normal. Pulmonic  valve regurgitation is not visualized. Aorta: The aortic root and ascending aorta are structurally normal, with no evidence of dilitation. Venous: The inferior vena cava is dilated in size with less than 50% respiratory variability, suggesting right atrial pressure of 15 mmHg. IAS/Shunts: No atrial level shunt detected by color flow Doppler.  LEFT VENTRICLE PLAX 2D LVIDd:         6.50 cm      Diastology LVIDs:         6.00 cm      LV e' medial:    2.39 cm/s LV PW:         1.20 cm      LV E/e' medial:  48.3 LV IVS:        1.40 cm      LV e' lateral:   11.40 cm/s LVOT diam:     2.40 cm      LV E/e' lateral: 10.1 LV SV:         33 LV SV Index:   13 LVOT Area:     4.52 cm  LV Volumes (MOD) LV vol d, MOD A2C: 236.0 ml LV vol d, MOD A4C: 285.0 ml LV vol s, MOD A2C: 202.0 ml LV vol s, MOD A4C: 255.0 ml LV SV MOD A2C:     34.0 ml LV SV MOD A4C:     285.0 ml LV SV MOD BP:      43.2 ml RIGHT VENTRICLE            IVC RV S prime:     6.31 cm/s  IVC diam: 3.10 cm TAPSE (M-mode): 1.2 cm LEFT ATRIUM              Index        RIGHT ATRIUM           Index LA diam:        5.80 cm  2.29 cm/m   RA Area:     31.50 cm LA Vol (A2C):   142.0 ml 56.12 ml/m  RA Volume:   131.00 ml 51.78 ml/m LA Vol  (A4C):   107.0 ml 42.29 ml/m LA Biplane Vol: 126.0 ml 49.80 ml/m  AORTIC VALVE             PULMONIC VALVE LVOT Vmax:   59.40 cm/s  PR End Diast Vel: 2.08 msec LVOT Vmean:  38.300 cm/s LVOT VTI:    0.074 m  AORTA Ao Root diam: 3.10 cm Ao Asc diam:  3.60 cm MITRAL VALVE                  TRICUSPID VALVE MV Area (PHT): 5.54 cm       TR Peak grad:   28.5 mmHg MV Decel Time: 137 msec       TR Vmax:        267.00 cm/s MR Peak grad:    89.1 mmHg MR Mean grad:    53.0 mmHg    SHUNTS MR Vmax:         472.00 cm/s  Systemic VTI:  0.07 m MR Vmean:        335.0 cm/s   Systemic Diam: 2.40 cm MR PISA:         1.57 cm MR PISA Eff ROA: 15 mm MR PISA Radius:  0.50 cm MV E velocity: 115.50 cm/s Arvilla Meres MD Electronically signed by Arvilla Meres MD Signature Date/Time: 07/31/2023/2:22:37 PM    Final      Medications:  Scheduled Medications:  (feeding supplement) PROSource Plus  30 mL Oral TID BM   Chlorhexidine Gluconate Cloth  6 each Topical Daily   diclofenac Sodium  2 g Topical QID   enoxaparin (LOVENOX) injection  40 mg Subcutaneous Q24H   insulin aspart  0-15 Units Subcutaneous TID WC   insulin aspart  0-5 Units Subcutaneous QHS   insulin glargine-yfgn  20 Units Subcutaneous Daily   magnesium chloride  1 tablet Oral Daily   melatonin  3 mg Oral QHS   metolazone  10 mg Oral Once   multivitamin with minerals  1 tablet Oral Daily   nutrition supplement (JUVEN)  1 packet Oral BID BM   nystatin cream  1 Application Topical BID   sodium chloride flush  10 mL Intravenous Q12H   spironolactone  25 mg Oral Daily    Infusions:  sodium chloride Stopped (07/31/23 1503)   ceFEPime (MAXIPIME) IV 2 g (08/01/23 0612)   furosemide (LASIX) 200 mg in dextrose 5 % 100 mL (2 mg/mL) infusion 20 mg/hr (08/01/23 0101)   magnesium sulfate bolus IVPB      PRN Medications: sodium chloride, acetaminophen **OR** acetaminophen, mouth rinse, senna-docusate    Patient Profile  OTHAL RACKOW is a 52 y.o. male  w/ chronic systolic heart failure w/ severe biventricular dysfunction due to NICM, uncontrolled T2DM, chronic LE wounds/ chronic osteomyelitis s/p left toe amputation, rt TMA and poor compliance. AHF team to see for acute on chronic systolic heart failure and cardiogenic shock.    Assessment/Plan  Acute on chronic systolic heart failure with cardiogenic shock - NICM. Reports hx CHF in his mother.  - R/LHC (4/23): showed normal cors, mildly elevated filling pressures w/ normal CO - cMRI (4/24): LVEF 17%. RVEF 22%, LGE imaging very poor quality. Appears to have RV insertion site LGE (associated with worse prognosis) - Echo (5/24): EF < 20%, LV with GHK, RV mildly reduced, LA/RA sev dilated, mild-mod MR, mild TR, trivial pericardial effusion.  - Echo 11/24: EF 20-25%, RV mildly reduced, mild MR - NYHA IV on admission. SCAI stage B. Suspect exacerbation 2/2 noncompliance and drug use.  - Lactic acid 3.1>2.5>2.1.  - Sub-optimal diuresis with lasix gtt at 20/hr and 10 mg metolazone. Will give 10 mg metolazone BID today. We have very limited options if he fails to respond to increasing diuretics. Appears low output with sinus tach, narrow pulse pressure and rising Scr with attempts to diurese. - Continue spironolactone 25 mg daily  - No SGLT2i with uncontrolled DM and concern for hygiene. - Hold BB with volume overload and cardiogenic shock.   - Add losartan 25 for afterload reduction. - Hold UNNA boots with chronic BLE wounds - Not candidate for ICD with ongoing LE wounds/infection.  - Inotrope support would provide limited benefit. He would not be a candidate for home inotrope and worry he is now end-stage. He is also not a candidate for advanced therapies given poor compliance and co morbidities. Prognosis is poor. Will consult palliative care for GOC discussions.  2. Hypokalemia/hypomagnesemia -K corrected, 5 today. Hold K supp -Mag 1.7. Supp today   3. Chronic foot wounds/osteomyelitis -  Treated for cellulitis/osteomyelitis recent admit. Seen by ID. Concerned for culture neg endocarditis. He left AMA. Discharged with course of po antibiotics. Not candidate for home PICC line. - MRI this admit with concern for osteomyelitis involving both lower extremities. Continue IV antibiotics per primary team/ID   4. Poor med compliance/SDOH - lives in a camper behind  his fathers house and most recently in his cousin's basement - declined paramedicine - high risk for frequent readmits. Not sure that he is going to become stable enough to discharge back into the community. Palliative care consult as above.    Very poor prognosis. Limited options if he fails to respond to high dose IV lasix and BID metolazone.   Length of Stay: 2  Sharesa Kemp N, PA-C  08/01/2023, 7:36 AM  Advanced Heart Failure Team Pager [phone] (M-F; 7a - 5p)  Please contact [facility] for night-coverage after hours (5p -7a ) and weekends on [email or web address]

## 2023-08-01 NOTE — Plan of Care (Signed)

## 2023-08-02 DIAGNOSIS — E11621 Type 2 diabetes mellitus with foot ulcer: Secondary | ICD-10-CM | POA: Diagnosis not present

## 2023-08-02 DIAGNOSIS — I5023 Acute on chronic systolic (congestive) heart failure: Secondary | ICD-10-CM | POA: Diagnosis not present

## 2023-08-02 DIAGNOSIS — Z7189 Other specified counseling: Secondary | ICD-10-CM

## 2023-08-02 DIAGNOSIS — Z515 Encounter for palliative care: Secondary | ICD-10-CM

## 2023-08-02 DIAGNOSIS — E1169 Type 2 diabetes mellitus with other specified complication: Secondary | ICD-10-CM | POA: Diagnosis not present

## 2023-08-02 DIAGNOSIS — L97509 Non-pressure chronic ulcer of other part of unspecified foot with unspecified severity: Secondary | ICD-10-CM

## 2023-08-02 DIAGNOSIS — I110 Hypertensive heart disease with heart failure: Secondary | ICD-10-CM | POA: Diagnosis not present

## 2023-08-02 LAB — GLUCOSE, CAPILLARY
Glucose-Capillary: 137 mg/dL — ABNORMAL HIGH (ref 70–99)
Glucose-Capillary: 204 mg/dL — ABNORMAL HIGH (ref 70–99)
Glucose-Capillary: 143 mg/dL — ABNORMAL HIGH (ref 70–99)
Glucose-Capillary: 152 mg/dL — ABNORMAL HIGH (ref 70–99)
Glucose-Capillary: 155 mg/dL — ABNORMAL HIGH (ref 70–99)

## 2023-08-02 LAB — BASIC METABOLIC PANEL
Anion gap: 9 (ref 5–15)
BUN: 34 mg/dL — ABNORMAL HIGH (ref 6–20)
CO2: 28 mmol/L (ref 22–32)
Calcium: 8.2 mg/dL — ABNORMAL LOW (ref 8.9–10.3)
Chloride: 94 mmol/L — ABNORMAL LOW (ref 98–111)
Creatinine, Ser: 1.16 mg/dL (ref 0.61–1.24)
GFR, Estimated: >60 mL/min (ref >60)
Glucose, Bld: 133 mg/dL — ABNORMAL HIGH (ref 70–99)
Potassium: 3.6 mmol/L (ref 3.5–5.1)
Sodium: 131 mmol/L — ABNORMAL LOW (ref 135–145)

## 2023-08-02 LAB — CBC
HCT: 42.4 % (ref 39.0–52.0)
Hemoglobin: 13.4 g/dL (ref 13.0–17.0)
MCH: 27 pg (ref 26.0–34.0)
MCHC: 31.6 g/dL (ref 30.0–36.0)
MCV: 85.3 fL (ref 80.0–100.0)
Platelets: 245 10*3/uL (ref 150–400)
RBC: 4.97 MIL/uL (ref 4.22–5.81)
RDW: 18 % — ABNORMAL HIGH (ref 11.5–15.5)
WBC: 7.8 10*3/uL (ref 4.0–10.5)
nRBC: 0 % (ref 0.0–0.2)

## 2023-08-02 LAB — MAGNESIUM: Magnesium: 1.3 mg/dL — ABNORMAL LOW (ref 1.7–2.4)

## 2023-08-02 LAB — CK: Total CK: 42 U/L — ABNORMAL LOW (ref 49–397)

## 2023-08-02 MED ORDER — MAGNESIUM SULFATE 2 GM/50ML IV SOLN
2.0000 g | Freq: Once | INTRAVENOUS | Status: AC
  Administered 2023-08-02: 2 g via INTRAVENOUS
  Filled 2023-08-02: qty 50

## 2023-08-02 MED ORDER — POTASSIUM CHLORIDE CRYS ER 20 MEQ PO TBCR
40.0000 meq | EXTENDED_RELEASE_TABLET | Freq: Once | ORAL | Status: AC
  Administered 2023-08-02: 40 meq via ORAL
  Filled 2023-08-02: qty 2

## 2023-08-02 MED ORDER — MAGNESIUM SULFATE 4 GM/100ML IV SOLN
4.0000 g | Freq: Once | INTRAVENOUS | Status: AC
  Administered 2023-08-02: 4 g via INTRAVENOUS
  Filled 2023-08-02: qty 100

## 2023-08-02 MED ORDER — LOSARTAN POTASSIUM 50 MG PO TABS
50.0000 mg | ORAL_TABLET | Freq: Every day | ORAL | Status: DC
  Administered 2023-08-02 – 2023-08-03 (×2): 50 mg via ORAL
  Filled 2023-08-02 (×2): qty 1

## 2023-08-02 MED ORDER — METOLAZONE 5 MG PO TABS
10.0000 mg | ORAL_TABLET | Freq: Every day | ORAL | Status: DC
  Administered 2023-08-02 – 2023-08-04 (×3): 10 mg via ORAL
  Filled 2023-08-02 (×3): qty 2

## 2023-08-02 MED ORDER — MAGNESIUM SULFATE 2 GM/50ML IV SOLN
INTRAVENOUS | Status: AC
  Filled 2023-08-02: qty 50

## 2023-08-02 NOTE — Progress Notes (Signed)
Physical Therapy Treatment Patient Details Name: Dakota Ramirez MRN: [id number] DOB: 08/23/1970 Today's Date: 08/02/2023   History of Present Illness 52 y.o. male presents to [facility] hospital on 07/30/2023 with LE swelling and SOB. Pt recently admitted 11/13 for septic shock 2/2 osteomyelitis of foot, left AMA on 11/18. Pt admitted for management of acute on chronic systolic heart failure. 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    PT Comments  Pt seen for PT tx with pt reporting back, BLE pain. Pt agreeable to OOB mobility but requires encouragement & cuing to initiate supine>sit & requires extra time to do so. Pt is able to complete bed>recliner with CGA but reaching for armrests for BUE support/balance. Pt declines further mobility despite education & various options (gait, marching in place) & pt not open to education.  Will continue to follow pt & progress mobility as able.   If plan is discharge home, recommend the following: A little help with walking and/or transfers;Assistance with cooking/housework;Assist for transportation;Direct supervision/assist for medications management;Direct supervision/assist for financial management   Can travel by private vehicle        Equipment Recommendations  Rollator (4 wheels)    Recommendations for Other Services       Precautions / Restrictions Precautions Precautions: Fall Precaution Comments: significant BLE and abdominal edema, bilateral foot wounds, RLE transmet Restrictions Weight Bearing Restrictions Per Provider Order: No     Mobility  Bed Mobility Overal bed mobility: Needs Assistance Bed Mobility: Supine to Sit     Supine to sit: Supervision, Used rails, HOB elevated (extra time, cuing to initiate, exits L side of bed, holds to bed rails)          Transfers Overall transfer level: Needs assistance Equipment used:  None Transfers: Bed to chair/wheelchair/BSC     Step pivot transfers: Contact guard assist (pt turns to hold to chair armrests for support/balance)            Ambulation/Gait Ambulation/Gait assistance:  (pt declines)                 Stairs             Wheelchair Mobility     Tilt Bed    Modified Rankin (Stroke Patients Only)       Balance Overall balance assessment: Needs assistance Sitting-balance support: No upper extremity supported, Feet supported Sitting balance-Leahy Scale: Fair     Standing balance support: No upper extremity supported, During functional activity, Single extremity supported Standing balance-Leahy Scale: Fair                              Cognition Arousal: Alert Behavior During Therapy: WFL for tasks assessed/performed Overall Cognitive Status: Within Functional Limits for tasks assessed                                 General Comments: decreased insight, not open to education        Exercises      General Comments General comments (skin integrity, edema, etc.): HR in 120s bpm at rest & during movement, no c/o symptoms      Pertinent Vitals/Pain Pain Assessment Pain Assessment: Faces Faces Pain Scale: Hurts little more Pain Location: back, feet Pain Descriptors / Indicators: Discomfort, Guarding, Grimacing Pain Intervention(s):  Monitored during session, Limited activity within patient's tolerance, Repositioned    Home Living                          Prior Function            PT Goals (current goals can now be found in the care plan section) Acute Rehab PT Goals Patient Stated Goal: to reduce swelling and improve activity tolerance PT Goal Formulation: With patient Time For Goal Achievement: 08/13/23 Potential to Achieve Goals: Fair Progress towards PT goals: PT to reassess next treatment    Frequency    Min 1X/week      PT Plan      Co-evaluation               AM-PAC PT "6 Clicks" Mobility   Outcome Measure  Help needed turning from your back to your side while in a flat bed without using bedrails?: A Little Help needed moving from lying on your back to sitting on the side of a flat bed without using bedrails?: A Little Help needed moving to and from a bed to a chair (including a wheelchair)?: A Little Help needed standing up from a chair using your arms (e.g., wheelchair or bedside chair)?: A Little Help needed to walk in hospital room?: A Lot Help needed climbing 3-5 steps with a railing? : A Lot 6 Click Score: 16    End of Session   Activity Tolerance:  (pt self limiting) Patient left: in chair;with call bell/phone within reach   PT Visit Diagnosis: Other abnormalities of gait and mobility (R26.89);Muscle weakness (generalized) (M62.81);Pain;Unsteadiness on feet (R26.81) Pain - Right/Left:  (bilateral) Pain - part of body: Ankle and joints of foot     Time: 2376-2831 PT Time Calculation (min) (ACUTE ONLY): 16 min  Charges:    $Therapeutic Activity: 8-22 mins PT General Charges $$ ACUTE PT VISIT: 1 Visit                     Aleda Grana, PT, DPT 08/02/23, 9:44 AM   [name] 08/02/2023, 9:43 AM

## 2023-08-02 NOTE — Consult Note (Signed)
Palliative Care Consult Note                                  Date: 08/02/2023   Patient Name: Dakota Ramirez  DOB: 1971-03-28  MRN: [id number]  Age / Sex: 52 y.o., male  PCP: Annett Fabian, MD Referring Physician: Ginnie Smart, MD  Reason for Consultation: Establishing goals of care  HPI/Patient Profile: 52 y.o. 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   Past Medical History:  Diagnosis Date   CHF (congestive heart failure) (HCC)    Diabetes mellitus without complication (HCC)    Type II   Heart failure with reduced ejection fraction (HCC) 11/26/2021   Hypertension    Sepsis with encephalopathy and septic shock (HCC) 06/26/2023   Shock (HCC) 06/26/2023    Subjective:   I have reviewed medical records including EPIC notes, labs and imaging, assessed the patient and then met with the patient to discuss diagnosis prognosis, GOC, EOL wishes, disposition and options.  I introduced Palliative Medicine as specialized medical care for people living with serious illness. It focuses on providing relief from symptoms and stress of a serious illness. The goal is to improve quality of life for both the patient and the family.  Patient faces treatment option decisions, advanced directive decisions and anticipatory care needs.  Today's Discussion: Patient understands his chronic and acute conditions. I reviewed my understanding, including limitations of ongoing interventions and risk for further decline despite treatment efforts. He shares that is mother had heart failure before she passed away  3 years ago.  We discuss his heart failure is a progressive disease with his most recent ejection fraction being < 10%. We discussed his two recent hospitalizations and the likelihood of future rehospitalizations. He discussed that he feels much better after "getting the fluid off." We discussed his hospitalizations are like "tune ups" and at some point the tune ups will no longer work as well.   Prior to admission the patient lived independently using a walking stick. He was eating well. 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 He shares his partner passed away three years ago and he has no children (his step child also passed away).   We discussed advanced directives. He shares he would want his brother Schad Melka to be his proxy decision maker if he were unable to make medical decisions himself. We discussed code status. Recommended consideration of DNR status, understanding evidenced-based poor outcomes in similar hospitalized patients, as the cause of the arrest is likely associated with chronic/terminal disease rather than a reversible acute cardio-pulmonary event. He would like to change his code status to DNR. We discussed scopes of care and the difference between a aggressive medical intervention path and a palliative comfort care path for this patient  at this time was had. We discussed he would likely be hospice eligible. This is a lot for him to consider and we agree to discuss again when his brother is also available. He remains full scope.  Patient shares he does not know what to do. He has put his "body through a lot." He seems to have some guilt regarding his health status. He wants to live but does not want to be a burden. He also would not want to be "a vegetable." We discussed options moving forward. We discussed the importance of considering his quality of life, overall suffering, what would be acceptable to him in the future. He  agrees these discussions are important. His brother is a [employer] architect but will be in town Monday. We hope to have a meeting to discuss goals of care with the patient and his brother.  Discussed the importance of continued conversation with family and the medical providers regarding overall plan of care and treatment options, ensuring decisions are within the context of the patient's values and GOCs.  Questions and concerns were addressed. The patient was encouraged to call with questions or concerns. PMT will continue to support holistically.  Review of Systems  Respiratory:  Positive for shortness of breath.   Cardiovascular:        Generalized edema at admission    Objective:   Primary Diagnoses: Present on Admission:  Acute on chronic systolic heart failure (HCC)  Diabetic foot ulcer with osteomyelitis [facility])   Physical Exam Vitals reviewed.  HENT:     Head: Normocephalic and atraumatic.  Cardiovascular:     Rate and Rhythm: Tachycardia present.  Pulmonary:     Effort: Pulmonary effort is normal.  Skin:    General: Skin is warm and dry.  Neurological:     Mental Status: He is alert.     Vital Signs:  BP 116/81 (BP Location: Left Arm)   Pulse (!) 119   Temp (!) 97.5 F (36.4 C) (Oral)   Resp 20   Ht 5\' 11"  (1.803 m)   Wt 130.6 kg   SpO2 100%   BMI 40.16 kg/m     Advanced Care Planning:   Existing Vynca/ACP Documentation: None  Primary Decision Maker: PATIENT. If patient were unable to make medical decisions he wants his brother Dennes Volkov to make decisions for him.  Code Status/Advance Care Planning: Changed to DNR    Assessment & Plan:   SUMMARY OF RECOMMENDATIONS   Changed to DNR Continue full scope of care Plan to meet with patient and brother Monday for further GOC discussion Continued PMT support  Extensive chart review has been completed prior to seeing the patient including labs, vital signs, imaging, progress/consult notes, orders,  medications, and available advance directive documents.  Discussed with: bedside RN and attending team  Time Total: 80 minutes  Thank you for allowing [place] to participate in the care of [name] PMT will continue to support holistically.   Signed by: Sarina Ser, NP Palliative Medicine Team  Team Phone # [phone] (Nights/Weekends)  08/02/2023, 3:15 PM

## 2023-08-02 NOTE — Progress Notes (Signed)
HD#3 SUBJECTIVE:  Patient Summary: Dakota Ramirez is a 52 y.o. with a pertinent PMH of heart failure with reduced ejection fraction, hypertension, uncontrolled diabetes, who presented with concerns for generalized body swelling and shortness of breath and admitted for acute on chronic heart failure.   Overnight Events: None  Interim History: Doing better this morning. General discomfort is somewhat relieved. He denies dyspnea. He feels his swelling is much better. He is eating breakfast.  OBJECTIVE:  Vital Signs: Vitals:   08/02/23 0033 08/02/23 0500 08/02/23 0551 08/02/23 0723  BP: 126/89  125/83 (!) 130/91  Pulse: (!) 120  (!) 119 (!) 121  Resp: 13  13 16   Temp: 97.7 F (36.5 C)  97.6 F (36.4 C) 97.8 F (36.6 C)  TempSrc: Oral  Oral Oral  SpO2: 100%  100% 100%  Weight:  130.6 kg    Height:       Supplemental O2: Room Air SpO2: 100 %  Filed Weights   07/31/23 0400 08/01/23 0034 08/02/23 0500  Weight: (!) 139.3 kg (!) 142 kg 130.6 kg     Intake/Output Summary (Last 24 hours) at 08/02/2023 1002 Last data filed at 08/02/2023 0600 Gross per 24 hour  Intake 1320 ml  Output 96045 ml  Net -11580 ml   Net IO Since Admission: -14,022.67 mL [08/02/23 1002]  Physical Exam: Physical Exam Constitutional:      General: He is not in acute distress.    Appearance: He is well-developed. He is obese. He is not ill-appearing.  Cardiovascular:     Rate and Rhythm: Regular rhythm. Tachycardia present.  Pulmonary:     Effort: Pulmonary effort is normal.     Breath sounds: Normal breath sounds. No wheezing or rales.  Abdominal:     Palpations: Abdomen is soft.     Tenderness: There is no abdominal tenderness.  Musculoskeletal:     Right lower leg: Edema present.     Left lower leg: Edema present.     Comments: Stable woody edema  Neurological:     Mental Status: He is alert.     Patient Lines/Drains/Airways Status     Active Line/Drains/Airways     Name Placement date  Placement time Site Days   Peripheral IV 07/30/23 20 G Left Forearm 07/30/23  0255  Forearm   3   Peripheral IV 07/31/23 20 G 1.88" Anterior;Right Forearm 07/31/23  1212  Forearm  2   Urethral Catheter urology 18 Fr. 07/30/23  1900  --  3   Wound / Incision (Open or Dehisced) 12/26/22 Diabetic ulcer Toe (Comment  which one) Anterior;Left 12/26/22  1838  Toe (Comment  which one)  219   Wound / Incision (Open or Dehisced) 12/26/22 Other (Comment) Pretibial Left;Lateral 12/26/22  1839  Pretibial  219   Wound / Incision (Open or Dehisced) 12/26/22 Other (Comment) Pretibial Right 12/26/22  1839  Pretibial  219   Wound / Incision (Open or Dehisced) 12/26/22 Other (Comment) Pretibial Proximal;Right 12/26/22  1840  Pretibial  219   Wound / Incision (Open or Dehisced) 12/26/22 Other (Comment) Heel Left 12/26/22  1841  Heel  219   Wound / Incision (Open or Dehisced) 06/26/23 Diabetic ulcer Heel Left Stage 4 ulcer 06/26/23  0330  Heel  37   Wound / Incision (Open or Dehisced) 06/26/23 Amputation Foot Anterior;Right 06/26/23  0330  Foot  37   Wound / Incision (Open or Dehisced) 07/30/23 Diabetic ulcer Heel Left;Posterior 07/30/23  1734  Heel  3   Wound / Incision (Open or Dehisced) 07/30/23 (ITD) Intertriginous Dermatitis Groin Right 07/30/23  1735  Groin  3             ASSESSMENT/PLAN:  Assessment: Principal Problem:   Acute on chronic systolic heart failure (HCC) Active Problems:   Acute on chronic congestive heart failure (HCC)   Acute on chronic heart failure (HCC)   Dakota Ramirez is a 52 y.o. with a pertinent PMH of heart failure with reduced ejection fraction, hypertension, uncontrolled diabetes, who presented with concerns for generalized body swelling and shortness of breath and admitted for acute on chronic heart failure.    Plan: Acute on Chronic Systolic Heart Failure  Day 4 of diuresis. Heart failure team is following. Presently treated with lasix drip 20/hr and metolazone. Down  11.5L and about 30 pounds, strong response yesterday. Perfusing well. Dyspnea and swelling pain in abdomen/legs is improved. Medication adherence is likely cause of this exacerbation. Current weight is 286lbs, admission 310lbs, dry weight is 250lbs. Pre-hospital ECHO 08/13/22 revealed EF 20-25%, in hospital ECHO with EF < 10%.   - Cardiology following, thank you - Rec: continue lasix drip 20/hr and reduce metolazone to 10 qd - Creatinine improved to 1.16 from 1.6. Yesterday vancomycin was changed to daptomycin with concern of nephropathy - Continue spironolactone 25 qd, hold beta blockers - Mg > 2 and K > 4, repleted both today - Daily weights, Ins/Outs, down 11.5L total, goal weight 250lbs, present weight 286lbs. - Guarded prognosis for end stage disease, palliative medicine involved   Chronic wound infection with chronic Osteomyelitis Inguinal fold intertrigo Stable. MRI suggests acute on chronic osteomyelitis. Per discussion with patient, he does not want to pursue surgery, is willing to do antibiotics. Continue Cefepime, changed Vanc do daptomycin per increase in creatinine. Pt is meant to be on chronic antibiotics for chronic osteomyelitis, but non-adherent (Patient was given two week course of linezolid and cefadroxil at time of [facility] discharge in November). 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 He had recent septic osteomyelitis in November, but no surgical indication at that time. Intertrigo is present in inguinal folds bilaterally. - Blood cultures no growth at 72 hours - Daptomycin and Cefepime - MRI bilateral feet reveals possible early osteomyelitis of L calcaneus, R 2nd/3rd metatarsals and possible midfoot vs arthropathy - Nystatin powder BID for inguinal intertrigo.   Anasarca, Scrotal Edema Severe scrotal edema, relief with scrotal sling, continue - US scrotum with possible L hydrocele   Uncontrolled Type 2 Diabetes Recent A1c ws 10.3. Likely  inconsistent with medicine use. Home regiment is novolog 30 BID. - Semglee 30 daily and SSI   Hypertension Pressures stable. Holding home coreg 6.25 BID. Continue home spironolactone.   Hypoalbuminemia and Protein-calorie malnutrition - RD consult: Continue 2 gram sodium diet as ordered 30 ml ProSource Plus TID, each supplement provides 100 kcals and 15 grams protein.  Juven BID to support wound healing Magic cup TID with meals, each supplement provides 290 kcal and 9 grams of protein MVI with minerals daily  Best Practice: Diet: Heart with fluid restrict IVF: None VTE: enoxaparin (LOVENOX) injection 40 mg Start: 07/30/23 1000 Code: Full AB: Cefepime, Daptomycin DISPO: Anticipated discharge in 4 days to Home vs SNF pending  Cardiovascular decongestion .  Signature: Katheran James, D.O.  Internal Medicine Resident, PGY-1 [facility]  Pager: [phone] 10:02 AM, 08/02/2023   Please contact the on call pager after  5 pm and on weekends at [phone].

## 2023-08-02 NOTE — TOC Progression Note (Signed)
Transition of Care [facility]) - Progression Note    Patient Details  Name: Dakota Ramirez MRN: [id number] Date of Birth: 02/23/1971  [facility] of [facility] CM/SW Contact  Elliot Cousin, RN Phone Number: [phone] 08/02/2023, 3:57 PM  Clinical Narrative:    HF TOC CM spoke to pt and offered choice for [facility], pt agreeable to [facility]. Contacted [facility] rep, Amy for HH. Will need HHRN orders with [facility].  Pt states his brother will provide transportation home.  Contacted HF CSW to follow up on [employer].  PCP appt scheduled.  Pt reports having his walking stick.  Educated on importance of daily weights and adhering to a low sodium, heart healthy diet.    Expected Discharge Plan: Home w Home Health Services Barriers to Discharge: Continued Medical Work up  Expected Discharge Plan and Services   Discharge Planning Services: CM Consult Post Acute Care Choice: Home Health Living arrangements for the past 2 months: Mobile Home                           HH Arranged: RN [facility] Agency: [facility] Date [facility] Agency Contacted: 08/02/23 Time HH Agency Contacted: 1554 Representative spoke with at [facility] Agency: Amy Hyatt   Social Determinants of Health (SDOH) Interventions SDOH Screenings   Food Insecurity: Food Insecurity Present (12/28/2022)  Housing: Patient Declined (12/28/2022)  Transportation Needs: No Transportation Needs (11/29/2021)  Utilities: Not At Risk (12/28/2022)  Depression (PHQ2-9): Low Risk  (01/17/2023)  Recent Concern: Depression (PHQ2-9) - Medium Risk (01/10/2023)  Financial Resource Strain: High Risk (01/10/2023)  Social Connections: Unknown (12/15/2021)   Received from [facility], [facility]  Stress: No Stress Concern Present (02/28/2021)   Received from [facility], [facility]  Tobacco Use: Low Risk  (07/30/2023)    Readmission Risk Interventions     No data to display

## 2023-08-02 NOTE — Progress Notes (Signed)
Advanced Heart Failure Rounding Note  Cardiologist: None   Subjective:    ~13L UOP yesterday with lasix gtt at 20/hr + 10 mg metolazone BID. Weight down 26 lb.   Labs pending.  Dyspnea and lower extremity edema improving.    Objective:   Weight Range: 130.6 kg Body mass index is 40.16 kg/m.   Vital Signs:   Temp:  [97 F (36.1 C)-98 F (36.7 C)] 97.6 F (36.4 C) (12/20 0551) Pulse Rate:  [119-128] 119 (12/20 0551) Resp:  [13-20] 13 (12/20 0551) BP: (125-135)/(83-102) 125/83 (12/20 0551) SpO2:  [99 %-100 %] 100 % (12/20 0551) Weight:  [130.6 kg] 130.6 kg (12/20 0500) Last BM Date : 08/01/23  Weight change: Filed Weights   07/31/23 0400 08/01/23 0034 08/02/23 0500  Weight: (!) 139.3 kg (!) 142 kg 130.6 kg    Intake/Output:   Intake/Output Summary (Last 24 hours) at 08/02/2023 0659 Last data filed at 08/02/2023 0600 Gross per 24 hour  Intake 1400 ml  Output 09811 ml  Net -11500 ml      Physical Exam  General: Acute on chronically ill appearing HEENT: multiple healing wounds Neck: supple. Jvp to jaw.  Cor: Regular rate & rhythm, tachy. No rubs, gallops or murmurs. Lungs: diminished Abdomen: soft, nontender, distended.  Extremities: no cyanosis, clubbing, rash, 2+ woody edema Neuro: alert & orientedx3. Affect depressed.    Telemetry   ST 120s   Labs    CBC Recent Labs    07/31/23 0256 08/01/23 0257  WBC 8.9 7.4  HGB 12.0* 13.4  HCT 38.0* 43.6  MCV 87.6 88.6  PLT 243 260   Basic Metabolic Panel Recent Labs    91/47/82 0256 08/01/23 0257 08/01/23 1804  NA 134* 133* 132*  K 4.1 5.0 4.4  CL 100 98 99  CO2 26 26 25   GLUCOSE 108* 163* 163*  BUN 26* 34* 38*  CREATININE 1.35* 1.64* 1.36*  CALCIUM 8.1* 8.4* 8.5*  MG 1.3* 1.7  --    Liver Function Tests Recent Labs    07/30/23 1235  AST 30  ALT 18  ALKPHOS 174*  BILITOT 1.0  PROT 8.1  ALBUMIN 1.8*   No results for input(s): "LIPASE", "AMYLASE" in the last 72  hours. Cardiac Enzymes No results for input(s): "CKTOTAL", "CKMB", "CKMBINDEX", "TROPONINI" in the last 72 hours.  BNP: BNP (last 3 results) Recent Labs    07/09/23 1353 07/24/23 1451 07/30/23 0044  BNP 1,441.0* 1,670.4* 2,303.1*    ProBNP (last 3 results) No results for input(s): "PROBNP" in the last 8760 hours.   D-Dimer No results for input(s): "DDIMER" in the last 72 hours. Hemoglobin A1C No results for input(s): "HGBA1C" in the last 72 hours. Fasting Lipid Panel No results for input(s): "CHOL", "HDL", "LDLCALC", "TRIG", "CHOLHDL", "LDLDIRECT" in the last 72 hours. Thyroid Function Tests No results for input(s): "TSH", "T4TOTAL", "T3FREE", "THYROIDAB" in the last 72 hours.  Invalid input(s): "FREET3"  Other results:   Imaging    No results found.    Medications:     Scheduled Medications:  (feeding supplement) PROSource Plus  30 mL Oral TID BM   Chlorhexidine Gluconate Cloth  6 each Topical Daily   diclofenac Sodium  2 g Topical QID   enoxaparin (LOVENOX) injection  40 mg Subcutaneous Q24H   insulin aspart  0-15 Units Subcutaneous TID WC   insulin aspart  0-5 Units Subcutaneous QHS   insulin glargine-yfgn  20 Units Subcutaneous Daily   losartan  25 mg  Oral Daily   magnesium chloride  1 tablet Oral Daily   melatonin  3 mg Oral QHS   metolazone  10 mg Oral BID   multivitamin with minerals  1 tablet Oral Daily   nutrition supplement (JUVEN)  1 packet Oral BID BM   nystatin cream  1 Application Topical BID   sodium chloride flush  10 mL Intravenous Q12H   spironolactone  25 mg Oral Daily    Infusions:  ceFEPime (MAXIPIME) IV 2 g (08/02/23 0452)   DAPTOmycin Stopped (08/01/23 1901)   furosemide (LASIX) 200 mg in dextrose 5 % 100 mL (2 mg/mL) infusion 20 mg/hr (08/02/23 0459)    PRN Medications: acetaminophen **OR** acetaminophen, mouth rinse, senna-docusate    Patient Profile  Dakota Ramirez is a 52 y.o. male w/ chronic systolic heart failure w/  severe biventricular dysfunction due to NICM, uncontrolled T2DM, chronic LE wounds/ chronic osteomyelitis s/p left toe amputation, rt TMA and poor compliance. AHF team to see for acute on chronic systolic heart failure and cardiogenic shock.    Assessment/Plan  Stage D acute on chronic biventricular heart failure - NICM.  - R/LHC (4/23): showed normal cors, mildly elevated filling pressures w/ normal CO - cMRI (4/24): LVEF 17%. RVEF 22%, LGE imaging very poor quality. Appears to have RV insertion site LGE (associated with worse prognosis) - Echo (5/24): EF < 20%, LV with GHK, RV mildly reduced, LA/RA sev dilated, mild-mod MR, mild TR, trivial pericardial effusion.  - Echo 11/24: EF 20-25%, RV mildly reduced, mild MR - NYHA IV on admission. Stage D CHF with biventricular dysfunction. Suspect exacerbation 2/2 noncompliance and drug use.  - Lactic acid 3.1>2.5>2.1.  - Excellent diuresis yesterday. Continue lasix gtt at 20/hr. Will give 10 mg metolazone just once today d/t > 20 lb weight loss over last 24 hrs.  - Continue spironolactone 25 mg daily  - No SGLT2i with uncontrolled DM and concern for hygiene. - Hold BB with volume overload and cardiogenic shock.   - Increase losartan to 50 mg daily - Hold UNNA boots with chronic BLE wounds - Not candidate for ICD with ongoing LE wounds/infection.  - Inotrope support would provide limited benefit. He would not be a candidate for home inotrope and worry he is now end-stage. He is also not a candidate for advanced therapies given poor compliance and co morbidities. Prognosis is poor. Palliative Care consulted to assist with goals of care discussions. Hospice would be appropriate if he is willing to consider.  2. Hypokalemia/hypomagnesemia -Supp aggressively with brisk diuresis -Awaiting AM labs   3. Chronic foot wounds/osteomyelitis - Treated for cellulitis/osteomyelitis recent admit. Seen by ID. Concerned for culture neg endocarditis. He left AMA.  Discharged with course of po antibiotics. Not candidate for home PICC line. - MRI this admit with concern for osteomyelitis involving both lower extremities. Continue IV antibiotics per primary team/ID - No evidence of endocarditis on TTE   4. Poor med compliance/SDOH - lives in a camper behind his fathers house and most recently in his cousin's basement - declined paramedicine - high risk for frequent readmits. Not sure that he is going to become stable enough to discharge back into the community. Palliative care consult as above.    Length of Stay: 3  Adelis Docter N, PA-C  08/02/2023, 6:59 AM  Advanced Heart Failure Team Pager [phone] (M-F; 7a - 5p)  Please contact [facility] for night-coverage after hours (5p -7a ) and weekends on [email or web address]

## 2023-08-02 NOTE — Plan of Care (Signed)
  Problem: Coping: Goal: Ability to adjust to condition or change in health will improve Outcome: Progressing   Problem: Fluid Volume: Goal: Ability to maintain a balanced intake and output will improve Outcome: Progressing

## 2023-08-02 NOTE — Progress Notes (Signed)
Occupational Therapy Treatment Patient Details Name: Dakota Ramirez MRN: [id number] DOB: 1970-11-26 Today's Date: 08/02/2023   History of present illness Pt is a 52 y.o. male presents to [facility] hospital on 07/30/2023 with LE swelling and SOB. Pt recently admitted 11/13 for septic shock 2/2 osteomyelitis of foot, left AMA on 11/18. Pt admitted for management of acute on chronic systolic heart failure. 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   OT comments  Pt resting in bed upon OT arrival and initially declining participation, transfer to recliner, and repositioning in the bed due to back and foot pain and discomfort in positioning in the bed. OT educated pt in importance and benefits of OOB activity, role of skilled rehab services, and benefits of improved positioning in the bed. Pt verbalizing understanding of all training and then agreeable to repositioning in the bed but continuing to decline sitting EOB or OOB activity this session stating he had already been up in the recliner earlier in the day. Pt demonstrates ability to complete rolling and repositioning in the bed with Supervision and cues for positioning in alignment with proper support for head/neck. Pt with HR in 120s and O2 sat >/98% on RA throughout session. Pt with limited participation and not making progress toward OT goals secondary to self-limiting behaviors. With improved participation, pt will benefit from continued acute skilled OT services to address deficits outlined below and increase safety and independence with functional tasks. Post acute discharge, pt will benefit from continued skilled OT services in the home to maximize rehab potential.       If plan is discharge home, recommend the following:  A little help with walking and/or transfers;A little help with bathing/dressing/bathroom;Assist for transportation;Help with stairs  or ramp for entrance;Assistance with cooking/housework   Equipment Recommendations  None recommended by OT    Recommendations for Other Services      Precautions / Restrictions Precautions Precautions: Fall Precaution Comments: significant BLE and abdominal edema, bilateral foot wounds, RLE transmet Restrictions Weight Bearing Restrictions Per Provider Order: No       Mobility Bed Mobility Overal bed mobility: Needs Assistance Bed Mobility: Rolling Rolling: Used rails, Supervision         General bed mobility comments: Pt reporting back and foot pain and discomfort in positioning in bed upon OT arrival but with pt declining transfer to recliner and initially declining repositioning in bed. OT educated pt in importance of OOB activity, benefits of sitting up in recliner, and benefits of repositioning in the bed with improved core alignment and head/neck support with pt verbalizing understanding. Following education, pt agreeable to repositioning in the bed with cues needed for initiation and improved positioning. Pt conitnued to decline sitting EOB or OOB activity this session.    Transfers                   General transfer comment: Pt declined OOB activity     Balance                                           ADL either performed or assessed with clinical judgement   ADL  General ADL Comments: Pt declined addressing ADLs this session secondary to pain and fatigue.    Extremity/Trunk Assessment Upper Extremity Assessment Upper Extremity Assessment: Overall WFL for tasks assessed   Lower Extremity Assessment Lower Extremity Assessment: Defer to PT evaluation        Vision       Perception     Praxis      Cognition Arousal: Alert Behavior During Therapy: [name] for tasks assessed/performed Overall Cognitive Status: Within Functional Limits for tasks assessed                                  General Comments: Largely WFL but with poor insight into deficits and role of skilled rehab.        Exercises      Shoulder Instructions       General Comments OT educated pt on role of skilled rehab services and importance of OOB activity and active participation with pt verbalizing understanding but with minimal participation this session. HR in 120s throughout session from bed level. O2 sat >/98% on RA throughout session.    Pertinent Vitals/ Pain       Pain Assessment Pain Assessment: Faces Faces Pain Scale: Hurts even more Pain Location: back, feet Pain Descriptors / Indicators: Discomfort, Guarding, Grimacing Pain Intervention(s): Monitored during session, Repositioned, Limited activity within patient's tolerance  Home Living                                          Prior Functioning/Environment              Frequency  Min 1X/week        Progress Toward Goals  OT Goals(current goals can now be found in the care plan section)  Progress towards OT goals: Not progressing toward goals - comment (secondary to self-limiting behaviors)  Acute Rehab OT Goals Patient Stated Goal: to return home, feel better, and have more time to rest  Plan      Co-evaluation                 AM-PAC OT "6 Clicks" Daily Activity     Outcome Measure   Help from another person eating meals?: None Help from another person taking care of personal grooming?: A Little Help from another person toileting, which includes using toliet, bedpan, or urinal?: A Little Help from another person bathing (including washing, rinsing, drying)?: A Lot Help from another person to put on and taking off regular upper body clothing?: A Little Help from another person to put on and taking off regular lower body clothing?: A Lot 6 Click Score: 17    End of Session    OT Visit Diagnosis: Other abnormalities of gait and mobility (R26.89);Repeated  falls (R29.6)   Activity Tolerance Other (comment);Patient limited by fatigue;Patient limited by pain (Pt with self-limiting behaviors)   Patient Left in bed;with call bell/phone within reach   Nurse Communication Other (comment);Mobility status (Pt with limited participation and refusing OOB activity this session.)        Time: 3086-5784 OT Time Calculation (min): 9 min  Charges: OT General Charges $OT Visit: 1 Visit OT Treatments $Therapeutic Activity: 8-22 mins  Martel Galvan "Orson Eva., OTR/L, MA Acute Rehab [phone]   Lendon Colonel 08/02/2023, 4:01 PM

## 2023-08-03 DIAGNOSIS — Z7189 Other specified counseling: Secondary | ICD-10-CM | POA: Diagnosis not present

## 2023-08-03 DIAGNOSIS — I5023 Acute on chronic systolic (congestive) heart failure: Secondary | ICD-10-CM | POA: Diagnosis not present

## 2023-08-03 LAB — BASIC METABOLIC PANEL
Anion gap: 6 (ref 5–15)
BUN: 41 mg/dL — ABNORMAL HIGH (ref 6–20)
CO2: 33 mmol/L — ABNORMAL HIGH (ref 22–32)
Calcium: 8.1 mg/dL — ABNORMAL LOW (ref 8.9–10.3)
Chloride: 90 mmol/L — ABNORMAL LOW (ref 98–111)
Creatinine, Ser: 1.2 mg/dL (ref 0.61–1.24)
GFR, Estimated: >60 mL/min (ref >60)
Glucose, Bld: 122 mg/dL — ABNORMAL HIGH (ref 70–99)
Potassium: 3.9 mmol/L (ref 3.5–5.1)
Sodium: 129 mmol/L — ABNORMAL LOW (ref 135–145)

## 2023-08-03 LAB — GLUCOSE, CAPILLARY
Glucose-Capillary: 191 mg/dL — ABNORMAL HIGH (ref 70–99)
Glucose-Capillary: 135 mg/dL — ABNORMAL HIGH (ref 70–99)
Glucose-Capillary: 151 mg/dL — ABNORMAL HIGH (ref 70–99)
Glucose-Capillary: 144 mg/dL — ABNORMAL HIGH (ref 70–99)

## 2023-08-03 LAB — CBC
HCT: 41.1 % (ref 39.0–52.0)
Hemoglobin: 13.1 g/dL (ref 13.0–17.0)
MCH: 27.1 pg (ref 26.0–34.0)
MCHC: 31.9 g/dL (ref 30.0–36.0)
MCV: 84.9 fL (ref 80.0–100.0)
Platelets: 259 10*3/uL (ref 150–400)
RBC: 4.84 MIL/uL (ref 4.22–5.81)
RDW: 18.3 % — ABNORMAL HIGH (ref 11.5–15.5)
WBC: 7.6 10*3/uL (ref 4.0–10.5)
nRBC: 0 % (ref 0.0–0.2)

## 2023-08-03 LAB — MAGNESIUM: Magnesium: 1.8 mg/dL (ref 1.7–2.4)

## 2023-08-03 MED ORDER — POTASSIUM CHLORIDE CRYS ER 20 MEQ PO TBCR
40.0000 meq | EXTENDED_RELEASE_TABLET | Freq: Once | ORAL | Status: AC
  Administered 2023-08-03: 40 meq via ORAL
  Filled 2023-08-03: qty 2

## 2023-08-03 MED ORDER — ACETAZOLAMIDE 250 MG PO TABS
500.0000 mg | ORAL_TABLET | Freq: Once | ORAL | Status: AC
  Administered 2023-08-03: 500 mg via ORAL
  Filled 2023-08-03: qty 2

## 2023-08-03 MED ORDER — ACETAMINOPHEN 500 MG PO TABS
1000.0000 mg | ORAL_TABLET | Freq: Three times a day (TID) | ORAL | Status: DC
Start: 2023-08-03 — End: 2023-08-04
  Administered 2023-08-03 – 2023-08-04 (×3): 1000 mg via ORAL
  Filled 2023-08-03 (×3): qty 2

## 2023-08-03 MED ORDER — BUMETANIDE 2 MG PO TABS
4.0000 mg | ORAL_TABLET | Freq: Two times a day (BID) | ORAL | Status: DC
Start: 2023-08-04 — End: 2023-08-04
  Administered 2023-08-04: 4 mg via ORAL
  Filled 2023-08-03 (×2): qty 2

## 2023-08-03 MED ORDER — SACUBITRIL-VALSARTAN 24-26 MG PO TABS
1.0000 | ORAL_TABLET | Freq: Two times a day (BID) | ORAL | Status: DC
  Administered 2023-08-03 – 2023-08-04 (×2): 1 via ORAL
  Filled 2023-08-03 (×2): qty 1

## 2023-08-03 NOTE — Progress Notes (Signed)
Advanced Heart Failure Rounding Note  Cardiologist: None   Subjective:    ~10L UOP yesterday with lasix gtt at 20/hr + 10 mg metolazone x1. Weight likely inaccurate.   Breathing better but uncomfortable overnight, tired of getting woken up.   Dyspnea and lower extremity edema improving. Would like to go home sooner rather than later.    Objective:   Weight Range: (!) 138.1 kg Body mass index is 42.46 kg/m.   Vital Signs:   Temp:  [96.5 F (35.8 C)-98.2 F (36.8 C)] 98.2 F (36.8 C) (12/21 1106) Pulse Rate:  [115-120] 119 (12/21 1106) Resp:  [13-18] 13 (12/21 1106) BP: (104-130)/(73-95) 108/73 (12/21 1106) SpO2:  [98 %-100 %] 98 % (12/21 1106) Weight:  [138.1 kg] 138.1 kg (12/21 0541) Last BM Date : 08/03/23  Weight change: Filed Weights   08/01/23 0034 08/02/23 0500 08/03/23 0541  Weight: (!) 142 kg 130.6 kg (!) 138.1 kg    Intake/Output:   Intake/Output Summary (Last 24 hours) at 08/03/2023 1200 Last data filed at 08/03/2023 1120 Gross per 24 hour  Intake 500 ml  Output 14782 ml  Net -12600 ml      Physical Exam  General: Acute on chronically ill appearing HEENT: multiple healing wounds Neck: supple. Jvp to jaw.  Cor: Regular rate & rhythm, tachy. No rubs, gallops or murmurs. Lungs: diminished Abdomen: soft, nontender, distended.  Extremities: no cyanosis, clubbing, rash, 2+ woody edema Neuro: alert & orientedx3. Affect depressed.    Telemetry   ST 120s   Patient Profile  Dakota Ramirez is a 52 y.o. male w/ chronic systolic heart failure w/ severe biventricular dysfunction due to NICM, uncontrolled T2DM, chronic LE wounds/ chronic osteomyelitis s/p left toe amputation, rt TMA and poor compliance. AHF team to see for acute on chronic systolic heart failure and cardiogenic shock.    Assessment/Plan  Stage D acute on chronic biventricular heart failure presented with normotensive cardiogenic shock - NICM.  - NYHA IV on admission. Stage D CHF  with biventricular dysfunction. Exacerbation progression of disease. - Lactic acid 3.1>2.5>2.1.  - Excellent diuresis yesterday. Continue lasix gtt at 20/hr. No metolazone today, given 1x acetazolamide given worsening hypochloremia. Will stop gtt tonight and start 4mg  bumex BID tomorrow. Will plan for biweekly metolazone - Continue spironolactone 25 mg daily  - No SGLT2i with uncontrolled DM and concern for hygiene. - Hold BB with volume overload and cardiogenic shock.   - Transition losartan to low dose entresto today - Hold UNNA boots with chronic BLE wounds - Not candidate for ICD with ongoing LE wounds/infection.  - Inotrope support would provide limited benefit. He would not be a candidate for home inotrope and worry he is now end-stage. He is also not a candidate for advanced therapies given poor compliance and co morbidities. Prognosis is poor. Palliative Care consulted to assist with goals of care discussions. Changed to DNR, will have outpatient palliative care support  2. Hypokalemia/hypomagnesemia -Supp aggressively with brisk diuresis   3. Chronic foot wounds/osteomyelitis - Treated for cellulitis/osteomyelitis recent admit. Seen by ID. Concerned for culture neg endocarditis. He left AMA. Discharged with course of po antibiotics. Not candidate for home PICC line. - MRI this admit with concern for osteomyelitis involving both lower extremities. Continue antibiotics per primary team/ID - No evidence of endocarditis on TTE   4. Poor med compliance/SDOH - lives in a camper behind his fathers house and most recently in his cousin's basement - declined paramedicine - high risk  for frequent readmits. Palliative care consult helpful    Length of Stay: 4  Romie Minus, MD  08/03/2023, 12:00 PM  Advanced Heart Failure Team Pager [phone] (M-F; 7a - 5p)  Please contact [facility] for night-coverage after hours (5p -7a ) and weekends on [email or web address]

## 2023-08-03 NOTE — Plan of Care (Signed)
  Problem: Coping: Goal: Ability to adjust to condition or change in health will improve Outcome: Progressing   Problem: Fluid Volume: Goal: Ability to maintain a balanced intake and output will improve Outcome: Progressing

## 2023-08-03 NOTE — Progress Notes (Signed)
HD#4 SUBJECTIVE:  Patient Summary: Dakota Ramirez is a 52 y.o. with a pertinent PMH of heart failure with reduced ejection fraction, hypertension, uncontrolled diabetes, who presented with concerns for generalized body swelling and shortness of breath and admitted for acute on chronic heart failure.   Overnight Events: None  Interim History: Pt made DNR yesterday, intends a goals of care discussion when borther can be present Monday. He is somewhat uncomfortable this morning, back pain from the bed, his IV came out last night and needed replacement. He feels that his breathing, abdominal swelling, and associated pain are much improved since admission.  OBJECTIVE:  Vital Signs: Vitals:   08/03/23 0036 08/03/23 0541 08/03/23 0725 08/03/23 1106  BP: 111/82 117/82 (!) 130/95 108/73  Pulse: (!) 120 (!) 119 (!) 119 (!) 119  Resp: 17 13 18 13   Temp: 97.8 F (36.6 C) 97.7 F (36.5 C) 97.9 F (36.6 C) 98.2 F (36.8 C)  TempSrc: Oral Oral Axillary Oral  SpO2: 99% 100% 98% 98%  Weight:  (!) 138.1 kg    Height:       Supplemental O2: Room Air SpO2: 98 %  Filed Weights   08/01/23 0034 08/02/23 0500 08/03/23 0541  Weight: (!) 142 kg 130.6 kg (!) 138.1 kg     Intake/Output Summary (Last 24 hours) at 08/03/2023 1120 Last data filed at 08/03/2023 1109 Gross per 24 hour  Intake 240 ml  Output 82956 ml  Net -12860 ml   Net IO Since Admission: -29,282.67 mL [08/03/23 1120]  Physical Exam: Physical Exam Constitutional:      General: He is not in acute distress.    Appearance: He is well-developed. He is obese. He is not ill-appearing.  Cardiovascular:     Rate and Rhythm: Regular rhythm. Tachycardia present.     Heart sounds: No murmur heard. Pulmonary:     Effort: Pulmonary effort is normal.     Breath sounds: Normal breath sounds.  Abdominal:     Palpations: Abdomen is soft.     Tenderness: There is no abdominal tenderness. There is no guarding.     Comments: Edema remains but  is improving since admission  Musculoskeletal:     Right lower leg: No tenderness. Edema present.     Left lower leg: No tenderness. Edema present.     Comments: Stable woody edema  Skin:    General: Skin is warm and dry.     Comments: Several chronic and stable healing skin ulcerations on face, trunk, legs  Neurological:     General: No focal deficit present.     Mental Status: He is alert and oriented to person, place, and time.  Psychiatric:        Behavior: Behavior normal.     Patient Lines/Drains/Airways Status     Active Line/Drains/Airways     Name Placement date Placement time Site Days   Peripheral IV 07/31/23 20 G 1.88" Anterior;Right Forearm 07/31/23  1212  Forearm  3   Urethral Catheter urology 18 Fr. 07/30/23  1900  --  4   Wound / Incision (Open or Dehisced) 12/26/22 Diabetic ulcer Toe (Comment  which one) Anterior;Left 12/26/22  1838  Toe (Comment  which one)  220   Wound / Incision (Open or Dehisced) 12/26/22 Other (Comment) Pretibial Left;Lateral 12/26/22  1839  Pretibial  220   Wound / Incision (Open or Dehisced) 12/26/22 Other (Comment) Pretibial Right 12/26/22  1839  Pretibial  220   Wound / Incision (Open  or Dehisced) 12/26/22 Other (Comment) Pretibial Proximal;Right 12/26/22  1840  Pretibial  220   Wound / Incision (Open or Dehisced) 12/26/22 Other (Comment) Heel Left 12/26/22  1841  Heel  220   Wound / Incision (Open or Dehisced) 06/26/23 Diabetic ulcer Heel Left Stage 4 ulcer 06/26/23  0330  Heel  38   Wound / Incision (Open or Dehisced) 06/26/23 Amputation Foot Anterior;Right 06/26/23  0330  Foot  38   Wound / Incision (Open or Dehisced) 07/30/23 Diabetic ulcer Heel Left;Posterior 07/30/23  1734  Heel  4   Wound / Incision (Open or Dehisced) 07/30/23 (ITD) Intertriginous Dermatitis Groin Right 07/30/23  1735  Groin  4             ASSESSMENT/PLAN:  Assessment: Principal Problem:   Acute on chronic systolic heart failure (HCC) Active Problems:    Diabetic foot ulcer with osteomyelitis (HCC)   Acute on chronic congestive heart failure (HCC)   Acute on chronic heart failure (HCC)  Dakota Ramirez is a 52 y.o. with a pertinent PMH of heart failure with reduced ejection fraction, hypertension, uncontrolled diabetes, who presented with concerns for generalized body swelling and shortness of breath and admitted for acute on chronic heart failure.    Plan: Acute on Chronic Systolic Heart Failure  Day 5 of diuresis. Heart failure team is following. Presently treated with lasix drip 20/hr and metolazone. Down 23L since admission. Weights variable and likely not accurate. Perfusing well. Dyspnea and swelling pain in abdomen/legs is improved. Medication adherence is likely cause of this exacerbation. Current weight is 304lbs, yesterday recorded 286lbs, admission 310lbs, dry weight is 250lbs. Pre-hospital ECHO 08/13/22 revealed EF 20-25%, in hospital ECHO with EF < 10%.   - Cardiology following, thank you - Rec: continue lasix drip 20/hr, stop metolazone, give acetazolamide 500 once. Change to orals tomorrow. - Continue spironolactone 25 qd, hold beta blockers, start entresto 24-26 daily - Mg > 2 and K > 4, repleted both today - Daily weights, Ins/Outs, down 29L total, goal weight 250lbs, present weight unclear. - Guarded prognosis for end stage disease, palliative medicine GOC discussion on 12/23   Chronic wound infection with chronic Osteomyelitis Inguinal fold intertrigo Stable. MRI suggests acute on chronic osteomyelitis. Per discussion with patient, he does not want to pursue surgery. Continue Cefepime and daptomycin (vanco correlated with creatinine elevatio). Pt is meant to be on chronic antibiotics for chronic osteomyelitis, but non-adherent (Patient was given two week course of linezolid and cefadroxil at time of [facility] discharge in November). 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 He had recent septic  osteomyelitis in November, but no surgical indication at that time. Intertrigo is improving in inguinal folds. - Blood cultures no growth at 96 hours - Daptomycin and Cefepime - MRI bilateral feet reveals possible early osteomyelitis of L calcaneus, R 2nd/3rd metatarsals and possible midfoot vs arthropathy - Nystatin powder BID for inguinal intertrigo.   Anasarca, Scrotal Edema Severe scrotal edema is improving, relief with scrotal sling, continue - US scrotum with possible L hydrocele   Uncontrolled Type 2 Diabetes Recent A1c ws 10.3. Likely inconsistent with medicine use. Home regiment is novolog 30 BID. - Semglee 30 daily and SSI   Hypertension Pressures stable. Holding home coreg 6.25 BID. Continue home spironolactone.   Hypoalbuminemia and Protein-calorie malnutrition - RD consult: 30 ml ProSource Plus TID, each supplement provides 100 kcals and 15 grams protein.  Juven BID to support wound healing Magic  cup TID with meals, each supplement provides 290 kcal and 9 grams of protein MVI with minerals daily  Best Practice: Diet: Heart healthy, will lift fluid restriction IVF: none VTE: enoxaparin (LOVENOX) injection 40 mg Start: 07/30/23 1000 Code: DNR/DNI AB: Cefepime and Daptomycin Family Contact: Aquila Mahi (brother), [phone] DISPO: Anticipated discharge in 2-3 days to  TBD  pending  cardiac decongestion and goals of care discussion .  Signature: Katheran James, D.O.  Internal Medicine Resident, PGY-1 [facility]  Pager: [phone] 11:20 AM, 08/03/2023   Please contact the on call pager after 5 pm and on weekends at [phone].

## 2023-08-03 NOTE — Progress Notes (Signed)
Daily Progress Note   Patient Name: Dakota Ramirez       Date: 08/03/2023 DOB: [birth year]  Age: 52 y.o. MRN#: [id number] Attending Physician: Tyson Alias, * Primary Care Physician: Annett Fabian, MD Admit Date: 07/30/2023  Reason for Consultation/Follow-up: Establishing goals of care  Length of Stay: 4  Current Medications: Scheduled Meds:   (feeding supplement) PROSource Plus  30 mL Oral TID BM   acetaminophen  1,000 mg Oral Q8H   [START ON 08/04/2023] bumetanide  4 mg Oral BID   Chlorhexidine Gluconate Cloth  6 each Topical Daily   diclofenac Sodium  2 g Topical QID   enoxaparin (LOVENOX) injection  40 mg Subcutaneous Q24H   insulin aspart  0-15 Units Subcutaneous TID WC   insulin aspart  0-5 Units Subcutaneous QHS   insulin glargine-yfgn  20 Units Subcutaneous Daily   magnesium chloride  1 tablet Oral Daily   melatonin  3 mg Oral QHS   metolazone  10 mg Oral Daily   multivitamin with minerals  1 tablet Oral Daily   nutrition supplement (JUVEN)  1 packet Oral BID BM   nystatin cream  1 Application Topical BID   sacubitril-valsartan  1 tablet Oral BID   sodium chloride flush  10 mL Intravenous Q12H   spironolactone  25 mg Oral Daily    Continuous Infusions:  ceFEPime (MAXIPIME) IV 2 g (08/03/23 0515)   DAPTOmycin 800 mg (08/02/23 1510)   furosemide (LASIX) 200 mg in dextrose 5 % 100 mL (2 mg/mL) infusion 20 mg/hr (08/03/23 1229)    PRN Meds: mouth rinse, senna-docusate  Physical Exam Vitals reviewed.  Constitutional:      Appearance: He is ill-appearing.  HENT:     Head: Normocephalic.  Cardiovascular:     Rate and Rhythm: Tachycardia present.  Pulmonary:     Effort: Pulmonary effort is normal.  Skin:    General: Skin is warm and dry.  Neurological:      Mental Status: He is alert and oriented to person, place, and time.  Psychiatric:        Mood and Affect: Mood is anxious.            Vital Signs: BP 108/73 (BP Location: Left Arm)   Pulse (!) 119   Temp 98.2 F (36.8 C) (Oral)  Resp 13   Ht 5\' 11"  (1.803 m)   Wt (!) 138.1 kg   SpO2 98%   BMI 42.46 kg/m  SpO2: SpO2: 98 % O2 Device: O2 Device: Room Air O2 Flow Rate:     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 01/10/2023   Diabetic osteomyelitis (HCC) 12/28/2022   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 to breakdown of skin (HCC) 12/28/2022   Severe protein-calorie malnutrition (HCC) 12/28/2022   Acute on chronic systolic heart failure (HCC) 12/28/2022   Elevated alkaline phosphatase level 12/27/2022   Acute on chronic congestive heart failure (HCC) 12/26/2022   Hyponatremia 11/29/2021   PVD (peripheral vascular disease) (HCC) 11/29/2021   Class 2 obesity 11/29/2021   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 male  with past medical history of chronic systolic heart failure w/ severe biventricular dysfunction due to NICM, hypertension, chronic LE wounds/ chronic osteomyelitis s/p left toe amputation, right transmetatarsal amputation and uncontrolled diabetes admitted on 07/30/2023 with concerns  for generalized body swelling and shortness of breath and admitted for acute on chronic heart failure.   Today's Discussion: Patient is sitting in bed. He looks uncomfortable. He reports he had trouble sleeping last night. He also reports he is having trouble getting comfortable-- he is having generalized discomfort. We discuss adding scheduled tylenol every eight hours to see if this helps his comfort level. With the combination of his poor sleep last night and discomfort he is eager to get home. Heart Failure provider Dr. Elwyn Lade shared with patient that he should be discharged in the next day or two once his health is optimized.   Patient shares that he plans to take his medications as prescribed at discharge and we discuss him having somebody help him organize his medications. He shares that he wants to live. He also shares that he doesn't know what to do and asks if people can make wrong decisions. We discuss that no decision is the wrong decision. I encourage him to continue discussions with his brother regarding his goals of care. If the patient is still hospitalized when his brother arrives Monday we hope to have a GOC conversation. We discussed outpatient palliative follow up as an additional support once he is discharged.   Encouraged patient to call PMT with any questions or concerns. PMT will continue to support.  Recommendations/Plan: DNR Full scope of care Plan to meet with patient and brother Monday re: goals of care discussion Add scheduled tylenol 1000 mg every 8 hours Continued PMT support- discussed outpatient palliative follow up   Code Status:    Code Status Orders  (From admission, onward)           Start     Ordered   08/02/23 1548  Do not attempt resuscitation (DNR) Pre-Arrest Interventions Desired  (Code Status)  Continuous       Question Answer  Comment  If pulseless and not breathing No CPR or chest compressions.   In Pre-Arrest Conditions (Patient Has Pulse  and Is Breathing) May intubate, use advanced airway interventions and cardioversion/ACLS medications if appropriate or indicated. May transfer to ICU.   Consent: Discussion documented in EHR or advanced directives reviewed      08/02/23 1547         Extensive chart review has been completed prior to seeing the patient including labs, vital signs, imaging, progress/consult notes, orders, medications, and available advance directive documents.   Care plan was discussed with Dr. Thomasene Ripple and Dr. Elwyn Lade  Time spent: 50 minutes  Thank you for allowing the Palliative Medicine Team to assist in the care of this patient.   Sherryll Burger, NP  Please contact Palliative Medicine Team phone at [phone] for questions and concerns.

## 2023-08-04 DIAGNOSIS — Z7189 Other specified counseling: Secondary | ICD-10-CM | POA: Diagnosis not present

## 2023-08-04 DIAGNOSIS — Z515 Encounter for palliative care: Secondary | ICD-10-CM | POA: Diagnosis not present

## 2023-08-04 DIAGNOSIS — I5023 Acute on chronic systolic (congestive) heart failure: Secondary | ICD-10-CM | POA: Diagnosis not present

## 2023-08-04 LAB — CBC
HCT: 38 % — ABNORMAL LOW (ref 39.0–52.0)
Hemoglobin: 12.3 g/dL — ABNORMAL LOW (ref 13.0–17.0)
MCH: 27.4 pg (ref 26.0–34.0)
MCHC: 32.4 g/dL (ref 30.0–36.0)
MCV: 84.6 fL (ref 80.0–100.0)
Platelets: 215 10*3/uL (ref 150–400)
RBC: 4.49 MIL/uL (ref 4.22–5.81)
RDW: 17.5 % — ABNORMAL HIGH (ref 11.5–15.5)
WBC: 7.6 10*3/uL (ref 4.0–10.5)
nRBC: 0 % (ref 0.0–0.2)

## 2023-08-04 LAB — BASIC METABOLIC PANEL
Anion gap: 11 (ref 5–15)
BUN: 47 mg/dL — ABNORMAL HIGH (ref 6–20)
CO2: 33 mmol/L — ABNORMAL HIGH (ref 22–32)
Calcium: 8.2 mg/dL — ABNORMAL LOW (ref 8.9–10.3)
Chloride: 87 mmol/L — ABNORMAL LOW (ref 98–111)
Creatinine, Ser: 1.26 mg/dL — ABNORMAL HIGH (ref 0.61–1.24)
GFR, Estimated: >60 mL/min (ref >60)
Glucose, Bld: 151 mg/dL — ABNORMAL HIGH (ref 70–99)
Potassium: 3.7 mmol/L (ref 3.5–5.1)
Sodium: 131 mmol/L — ABNORMAL LOW (ref 135–145)

## 2023-08-04 LAB — MAGNESIUM: Magnesium: 1.4 mg/dL — ABNORMAL LOW (ref 1.7–2.4)

## 2023-08-04 LAB — CULTURE, BLOOD (ROUTINE X 2)
Culture: NO GROWTH
Culture: NO GROWTH
Special Requests: ADEQUATE

## 2023-08-04 LAB — GLUCOSE, CAPILLARY: Glucose-Capillary: 129 mg/dL — ABNORMAL HIGH (ref 70–99)

## 2023-08-04 MED ORDER — POTASSIUM CHLORIDE CRYS ER 20 MEQ PO TBCR: 40.0000 meq | EXTENDED_RELEASE_TABLET | Freq: Every day | ORAL | 5 refills | Status: DC

## 2023-08-04 MED ORDER — SPIRONOLACTONE 25 MG PO TABS: 25.0000 mg | ORAL_TABLET | Freq: Every day | ORAL | 3 refills | Status: DC

## 2023-08-04 MED ORDER — LINEZOLID 600 MG PO TABS: 600.0000 mg | ORAL_TABLET | Freq: Two times a day (BID) | ORAL | 0 refills | Status: AC

## 2023-08-04 MED ORDER — CEFADROXIL 500 MG PO CAPS: 500.0000 mg | ORAL_CAPSULE | Freq: Two times a day (BID) | ORAL | 0 refills | Status: AC

## 2023-08-04 MED ORDER — BUMETANIDE 2 MG PO TABS: 4.0000 mg | ORAL_TABLET | Freq: Two times a day (BID) | ORAL | 3 refills | Status: DC

## 2023-08-04 MED ORDER — POTASSIUM CHLORIDE CRYS ER 20 MEQ PO TBCR
40.0000 meq | EXTENDED_RELEASE_TABLET | Freq: Two times a day (BID) | ORAL | Status: DC
  Administered 2023-08-04: 40 meq via ORAL
  Filled 2023-08-04: qty 2

## 2023-08-04 MED ORDER — MAGNESIUM SULFATE 4 GM/100ML IV SOLN
4.0000 g | Freq: Once | INTRAVENOUS | Status: AC
  Administered 2023-08-04: 4 g via INTRAVENOUS
  Filled 2023-08-04: qty 100

## 2023-08-04 MED ORDER — SACUBITRIL-VALSARTAN 24-26 MG PO TABS: 1.0000 | ORAL_TABLET | Freq: Two times a day (BID) | ORAL | 3 refills | Status: DC

## 2023-08-04 NOTE — Progress Notes (Signed)
Mobility Specialist Progress Note:    08/04/23 1222  Mobility  Activity Transferred from bed to chair  Level of Assistance Contact guard assist, steadying assist  Assistive Device Other (Comment) (HHA)  Distance Ambulated (ft) 5 ft  Activity Response Tolerated well  Mobility Referral Yes  Mobility visit 1 Mobility  Mobility Specialist Start Time (ACUTE ONLY) 1030  Mobility Specialist Stop Time (ACUTE ONLY) 1041  Mobility Specialist Time Calculation (min) (ACUTE ONLY) 11 min   Pt received sitting EOB agreeable to mobility. Pt in a poor mood d/t wanting to go home. Was able to stand and transfer to the chair w/ only HHA and contact guard. Situated in chair w/ call bell and personal belongings in reach. All needs met.  Thompson Grayer Mobility Specialist  Please contact vis Secure Chat or  Rehab Office [phone]

## 2023-08-04 NOTE — Progress Notes (Addendum)
Patient expresses frustration and states he is ready to go. Does not want to wait for voiding trail. Foley catheter removed without difficulty. Patient voided after foley removal in bathroom. Patient stated " it was medium" void. Patient putting on clothes at this time and escorted off unit by nursing.

## 2023-08-04 NOTE — Progress Notes (Signed)
PHARMACY ANTIBIOTIC CONSULT NOTE   Dakota Ramirez a 52 y.o. male  with BL feet osteomyelitis and large L calcaneous ulcer. Hx of L great toe amputation in 01/2023, R TMA 02/2021.  Pharmacy has been consulted for Cefepime and daptomycin dosing.  Patient is refusing surgery so plan for long antibiotic course. Plan to switch to po at discharge.   Patient is stable, wbc remains wnl, afebrile, BP stable. CK on 12/20 was 42, continue to monitor. Weight has decreased with diuresis, daptomycin dose remains appropriate.  Plan: Continue cefepime 2g q8hr  Continue daptomycin 8 mg/kg adjusted weight, rounded to 800mg  q24hr Monitor cultures, clinical status, renal function Narrow abx as able and f/u duration    Allergies:  No Known Allergies  Filed Weights   08/01/23 0034 08/02/23 0500 08/03/23 0541  Weight: (!) 142 kg (313 lb 0.9 oz) 130.6 kg (287 lb 14.7 oz) (!) 138.1 kg (304 lb 7.3 oz)       Latest Ref Rng & Units 08/04/2023    2:53 AM 08/03/2023    2:40 AM 08/02/2023    6:49 AM  CBC  WBC 4.0 - 10.5 K/uL 7.6  7.6  7.8   Hemoglobin 13.0 - 17.0 g/dL 16.1  09.6  04.5   Hematocrit 39.0 - 52.0 % 38.0  41.1  42.4   Platelets 150 - 400 K/uL 215  259  245      Antimicrobials this admission: MTZ x1 12/17  Cefepime 12/17>> Vancomycin 12/17>>12/18 Dapto 12/19>>   Microbiology results: 12/17 Bcx: ngtd x5  Thank you for allowing pharmacy to be a part of this patient's care.  Romie Minus, PharmD PGY1 Pharmacy Resident  Please check AMION for all [facility] Pharmacy phone numbers After 10:00 PM, call Main Pharmacy [phone]

## 2023-08-04 NOTE — Discharge Instructions (Addendum)
Mr. Gannon, Niccum were hospitalized for an acute exacerbation of your heart failure.  While here you were able to remove nearly 60 pounds of fluid that had collected in your body.  I hope that this will improve your breathing, pain, and mobility.  Going forward will be very important to take daily medicines for your heart failure, or else she will certainly end up in the hospital again and you would likely become more sick with each exacerbation.  Please take the following medicines: - Entresto 24/26mg  twice daily - Potassium daily - Spironolactone 25mg  daily - Bumex 4mg  twice daily - STOP your beta blocker Coreg - STOP your diuretic metolazone  You also have a bone infection in your feet.  In order to avoid sepsis and amputation, I would recommend taking oral antibiotics.  Please take the following medicines: - Linezolid 600mg  twice daily - Cefadroxil 500mg  twice daily Take these for 6 weeks. We request that you speak with our infectious disease physician Dr. Ninetta Lights for an appointment. Please expect a call to set that up.  You have a cardiology appointment on 08/18/22 with Dr. Gala Romney. Here you can discuss long-term treatment for your heart disease. The phone number is [phone]. You have an appointment with your PCP at the Internal Medicine clinic on 08/05/23. Here you will discuss your diabetes, bone infection, and other general health. The phone number is [phone]. Please do your best to attend these appointments. If unable, please call to reschedule.  Severe pain, severe fatigue, fevers, shortness of breath, and worsening swelling would be reasons to return to the ER.

## 2023-08-04 NOTE — Progress Notes (Signed)
Dr. Clotilde Dieter informed patient has foley catheter that was placed by urology. Patient has orders to discharge home. No urology follow up. Urology last note was on 12/17. Per provider, will consult with urology. Informed that urology stated it was ok for nursing to remove foley catheter.   Spoke with urologist at nursing station, informed to do voiding trial before discharging patient.

## 2023-08-04 NOTE — Progress Notes (Signed)
Advanced Heart Failure Rounding Note  Cardiologist: None   Subjective:    Patient would very much like to go home today. Appears to be diuresing well on po bumex, feels signfiicantly better than he has in months. We talked at length about paramedicine assistance given difficulty with diruesis and his meds and he was agreeable. He will see Dr. Gala Romney on 1/6.   Objective:   Weight Range: (!) 138.1 kg Body mass index is 42.46 kg/m.   Vital Signs:   Temp:  [97.2 F (36.2 C)-98.5 F (36.9 C)] 97.2 F (36.2 C) (12/22 0817) Pulse Rate:  [119] 119 (12/21 1544) Resp:  [13-24] 16 (12/22 0817) BP: (99-117)/(60-83) 117/83 (12/22 0817) SpO2:  [98 %-99 %] 98 % (12/22 0817) Last BM Date : 08/04/23  Weight change: Filed Weights   08/01/23 0034 08/02/23 0500 08/03/23 0541  Weight: (!) 142 kg 130.6 kg (!) 138.1 kg    Intake/Output:   Intake/Output Summary (Last 24 hours) at 08/04/2023 1051 Last data filed at 08/04/2023 0847 Gross per 24 hour  Intake 3425.36 ml  Output 16109 ml  Net -6724.64 ml      Physical Exam  General: Chronically ill appearing HEENT: multiple healing wounds Neck: supple. JVP mildly elevated Cor: Regular rate & rhythm, tachy. Systolic murmur. Lungs: diminished Abdomen: soft, nontender, distended.  Extremities: no cyanosis, clubbing, rash, no pitting edema, woody chronic venous stasis changes Neuro: alert & orientedx3. Affect normal    Telemetry   ST 120s   Patient Profile  Dakota Ramirez is a 52 y.o. male w/ chronic systolic heart failure w/ severe biventricular dysfunction due to NICM, uncontrolled T2DM, chronic LE wounds/ chronic osteomyelitis s/p left toe amputation, rt TMA and poor compliance. AHF team to see for acute on chronic systolic heart failure and cardiogenic shock.    Assessment/Plan  Stage D acute on chronic biventricular heart failure presented with normotensive cardiogenic shock - NICM.  - NYHA IV on admission. Stage D CHF  with biventricular dysfunction. Exacerbation progression of disease. - Lactic acid 3.1>2.5>2.1.  - Diuresed well with lasix gtt at 20 and metolazone 10mg  BID, net negative over 30L since admission.\ - Continue 4mg  BID bumex - Continue spironolactone 25 mg daily  - No SGLT2i with uncontrolled DM and concern for hygiene. - Hold BB with volume overload and cardiogenic shock.   - Continue low dose entresto - Hold UNNA boots with chronic BLE wounds - Not candidate for ICD with ongoing LE wounds/infection.  - Prognosis is poor. Palliative Care consulted to assist with goals of care discussions. Changed to DNR, will have outpatient palliative care support - Reasonable to discharge today with close follow up  2. Hypokalemia/hypomagnesemia -Supp aggressively with brisk diuresis, improving   3. Chronic foot wounds/osteomyelitis - Treated for cellulitis/osteomyelitis recent admit. Seen by ID. Concerned for culture neg endocarditis. He left AMA. Discharged with course of po antibiotics. Not candidate for home PICC line. - MRI this admit with concern for osteomyelitis involving both lower extremities. Continue antibiotics per primary team/ID, transition to orals at discharge - No evidence of endocarditis on TTE   4. Poor med compliance/SDOH - lives in a camper behind his fathers house and most recently in his cousin's basement - willing to discuss paramedicine - high risk for frequent readmits. Palliative care consult helpful  Medicine recommendations:  - Entresto 24/26mg  BID - Potassium daily - Spironolactone 25mg  daily - Hold BB - Bumex 4mg  BID  Length of Stay: 5  Sharlet Salina  Gerome Sam, MD  08/04/2023, 10:51 AM  Advanced Heart Failure Team Pager [phone] (M-F; 7a - 5p)  Please contact [facility] for night-coverage after hours (5p -7a ) and weekends on [email or web address]

## 2023-08-04 NOTE — TOC Transition Note (Signed)
Transition of Care [facility]) - Discharge Note   Patient Details  Name: Dakota Ramirez MRN: [id number] Date of Birth: [birth year]  Transition of [facility]) CM/SW Contact:  Lawerance Sabal, RN Phone Number: 08/04/2023, 12:54 PM   Clinical Narrative:     Notified [facility] that patient will DC today    Barriers to Discharge: Continued Medical Work up   Patient Goals and CMS Choice Patient states their goals for this hospitalization and ongoing recovery are:: wants to get better CMS Medicare.gov Compare Post Acute Care list provided to:: Patient    ownership interest in [facility]provided to:: Patient    Discharge Placement                       Discharge Plan and Services Additional resources added to the After Visit Summary for     Discharge Planning Services: CM Consult Post Acute Care Choice: Home Health                    HH Arranged: RN [facility] Agency: [facility] Date [facility] Agency Contacted: 08/02/23 Time HH Agency Contacted: 1554 Representative spoke with at [facility] Agency: Amy Hyatt  Social Drivers of Health (SDOH) Interventions SDOH Screenings   Food Insecurity: Food Insecurity Present (12/28/2022)  Housing: Patient Declined (12/28/2022)  Transportation Needs: No Transportation Needs (11/29/2021)  Utilities: Not At Risk (12/28/2022)  Depression (PHQ2-9): Low Risk  (01/17/2023)  Recent Concern: Depression (PHQ2-9) - Medium Risk (01/10/2023)  Financial Resource Strain: High Risk (01/10/2023)  Social Connections: Unknown (12/15/2021)   Received from [facility]  Stress: No Stress Concern Present (02/28/2021)   Received from [facility], [facility]  Tobacco Use: Low Risk  (07/30/2023)     Readmission Risk Interventions     No data to display

## 2023-08-04 NOTE — Progress Notes (Signed)
Daily Progress Note   Patient Name: Dakota Ramirez       Date: 08/04/2023 DOB: 1971-07-17  Age: 52 y.o. MRN#: [id number] Attending Physician: Tyson Alias, * Primary Care Physician: Annett Fabian, MD Admit Date: 07/30/2023  Reason for Consultation/Follow-up: Establishing goals of care  Length of Stay: 5  Current Medications: Scheduled Meds:   (feeding supplement) PROSource Plus  30 mL Oral TID BM   acetaminophen  1,000 mg Oral Q8H   bumetanide  4 mg Oral BID   Chlorhexidine Gluconate Cloth  6 each Topical Daily   diclofenac Sodium  2 g Topical QID   enoxaparin (LOVENOX) injection  40 mg Subcutaneous Q24H   insulin aspart  0-15 Units Subcutaneous TID WC   insulin aspart  0-5 Units Subcutaneous QHS   insulin glargine-yfgn  20 Units Subcutaneous Daily   magnesium chloride  1 tablet Oral Daily   melatonin  3 mg Oral QHS   metolazone  10 mg Oral Daily   multivitamin with minerals  1 tablet Oral Daily   nutrition supplement (JUVEN)  1 packet Oral BID BM   nystatin cream  1 Application Topical BID   potassium chloride  40 mEq Oral BID   sacubitril-valsartan  1 tablet Oral BID   sodium chloride flush  10 mL Intravenous Q12H   spironolactone  25 mg Oral Daily    Continuous Infusions:  ceFEPime (MAXIPIME) IV 2 g (08/04/23 0541)   DAPTOmycin 800 mg (08/03/23 1515)   magnesium sulfate bolus IVPB 4 g (08/04/23 0821)    PRN Meds: mouth rinse, senna-docusate  Physical Exam Vitals reviewed.  Constitutional:      Appearance: He is ill-appearing.  HENT:     Head: Normocephalic.  Cardiovascular:     Rate and Rhythm: Tachycardia present.  Pulmonary:     Effort: Pulmonary effort is normal.  Skin:    General: Skin is warm and dry.  Neurological:     Mental Status: He is  alert and oriented to person, place, and time.  Psychiatric:        Mood and Affect: Mood is anxious.             Vital Signs: BP 117/83 (BP Location: Left Arm)   Pulse (!) 119   Temp (!) 97.2 F (36.2 C) (Axillary)  Resp 16   Ht 5\' 11"  (1.803 m)   Wt (!) 138.1 kg   SpO2 98%   BMI 42.46 kg/m  SpO2: SpO2: 98 % O2 Device: O2 Device: Room Air O2 Flow Rate:     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 01/10/2023   Diabetic osteomyelitis (HCC) 12/28/2022   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 to breakdown of skin (HCC) 12/28/2022   Severe protein-calorie malnutrition (HCC) 12/28/2022   Acute on chronic systolic heart failure (HCC) 12/28/2022   Elevated alkaline phosphatase level 12/27/2022   Acute on chronic congestive heart failure (HCC) 12/26/2022   Hyponatremia 11/29/2021   PVD (peripheral vascular disease) (HCC) 11/29/2021   Class 2 obesity 11/29/2021   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 male  with past medical history of chronic systolic heart failure w/ severe biventricular dysfunction due to NICM, hypertension, chronic LE wounds/ chronic osteomyelitis s/p left toe amputation, right transmetatarsal amputation and uncontrolled diabetes admitted on 07/30/2023 with concerns for generalized  body swelling and shortness of breath and admitted for acute on chronic heart failure.   Today's Discussion: Patient is sitting in bed. He is very eager to get home. He is used to being outside and being active and sitting in the room is getting to him-- he states he is agitated. He reports the tylenol has helped but he is still having trouble getting comfortable.   Patient shares that he plans to take his medications as prescribed at discharge and we discuss him having somebody help him organize his medications. He shares that he wants to live. I encourage him to continue discussions with his brother regarding his goals of care We discussed outpatient palliative follow up as an additional support once he is discharged but he is not currently interested in this service.  Encouraged patient to call PMT with any questions or concerns. PMT will continue to support.  Recommendations/Plan: DNR Full scope of care Continue scheduled tylenol 1000 mg every 8 hours Encouraged continued discussion re: goals of care Continued PMT support- discussed outpatient palliative follow up   Code Status:    Code Status Orders  (From admission, onward)           Start     Ordered   08/02/23 1548  Do not attempt resuscitation (DNR) Pre-Arrest Interventions Desired  (Code Status)  Continuous       Question Answer Comment  If pulseless and not breathing No CPR or chest compressions.   In Pre-Arrest Conditions (Patient Has Pulse and Is Breathing) May intubate, use advanced airway interventions and cardioversion/ACLS medications if appropriate or indicated. May transfer to ICU.   Consent: Discussion documented in EHR or advanced directives reviewed      08/02/23 1547         Extensive chart review has been completed prior to seeing the patient including labs, vital signs,  imaging, progress/consult notes, orders, medications, and available advance directive documents.   Care plan was discussed with Dr.  Thomasene Ripple and bedside RN  Time spent: 50 minutes  Thank you for allowing the Palliative Medicine Team to assist in the care of this patient.   Sherryll Burger, NP  Please contact Palliative Medicine Team phone at [phone] for questions and concerns.

## 2023-08-04 NOTE — Discharge Summary (Addendum)
Name: Dakota Ramirez MRN: [id number] DOB: 29-Oct-1970 52 y.o. PCP: Annett Fabian, MD  Date of Admission: 07/30/2023 12:07 AM Date of Discharge:  08/04/23 Attending Physician: Dr. Oswaldo Done  DISCHARGE DIAGNOSIS:  Primary Problem: Acute on chronic systolic heart failure [facility])   Hospital Problems: Principal Problem:   Acute on chronic systolic heart failure [facility]) Active Problems:   Diabetic foot ulcer with osteomyelitis (HCC)   Acute on chronic congestive heart failure (HCC)   Acute on chronic heart failure (HCC)    DISCHARGE MEDICATIONS:   Allergies as of 08/04/2023   No Known Allergies      Medication List     STOP taking these medications    carvedilol 6.25 MG tablet Commonly known as: COREG   metolazone 5 MG tablet Commonly known as: ZAROXOLYN   torsemide 20 MG tablet Commonly known as: DEMADEX       TAKE these medications    bumetanide 2 MG tablet Commonly known as: BUMEX Take 2 tablets (4 mg total) by mouth 2 (two) times daily.   cefadroxil 500 MG capsule Commonly known as: DURICEF Take 1 capsule (500 mg total) by mouth 2 (two) times daily.   linezolid 600 MG tablet Commonly known as: ZYVOX Take 1 tablet (600 mg total) by mouth 2 (two) times daily.   NovoLOG Mix 70/30 FlexPen (70-30) 100 UNIT/ML FlexPen Generic drug: insulin aspart protamine - aspart Inject 30 Units into the skin 2 (two) times daily with a meal.   potassium chloride SA 20 MEQ tablet Commonly known as: KLOR-CON M Take 2 tablets (40 mEq total) by mouth daily. Take extra 40 meq on day you take metolazone. Take extra 40 mg on days you use Furoxcix.   sacubitril-valsartan 24-26 MG Commonly known as: ENTRESTO Take 1 tablet by mouth 2 (two) times daily.   spironolactone 25 MG tablet Commonly known as: ALDACTONE Take 1 tablet (25 mg total) by mouth daily.        DISPOSITION AND FOLLOW-UP:   Follow-up Recommendations: Dakota Ramirez was discharged from [facility] in fair condition. At the hospital follow up visit please address:  He was hospitalized for acute exacerbation of HFrEF. He was diuresed over the course of six days, removing approximately 60lbs of fluid with admission weight of 313lbs.  At follow up, please evaluate for weight gain (unfortunately discharge weight is unreliable but dry weight is 250lbs and he is was nearly euvolemic on discharge), adherence with medication. Consider BMP.   He was discharged on the following cardiac regimen: - Entresto 24/26mg  BID - Potassium daily - Spironolactone 25mg  daily - Bumex 4mg  BID - Hold BB given heart failure/concern for shock - Hold SGLT2i given uncontrolled diabetes/concern for infection  For osteomyelitis, he was discharged with: Linezolid 600 BID x 6 weeks Cefadroxil 500 BID x 6 weeks  Medicines sent to [facility] in [place]. Contacted the [facility] front desk for arrange PCP and ID clinic follow-up.  Consults: Sees cardiology on 08/18/22 Labs: Basic Metabolic Profile  Follow-up Appointments:  Follow-up Information     Annett Fabian, MD. Go in 2 day(s).   Specialty: Internal Medicine Why: Hospital follow up appointment scheduled for Monday, August 05, 2023 at 1:15 PM.  PLEASE ARRIVE 10-15 minutes early.  PLEASE call and cancel/reschedule if you CANNOT make appointment. Contact information: [address] [place] [address] [phone]         [facility] Follow up.   Why: Home health agency  will call to arrange appts Contact information: [address] [place] [address] [phone]                 HOSPITAL COURSE:  Patient Summary:  Acute on Chronic Systolic Heart Failure  Dakota Ramirez was agressively diuresed after presenting approximately 60 poinds over a dry weight of 250lbs. Diuresis occurred over 7 days, initially with lasix drip and then transitioned to oral bumex. He was followed by the cardiology/heart failure  team. In-hospital ECHO revealed EF below 10% and he was tachycardic throughout hospitalization. Pre-hospital ECHO with EF 20-25%. Symptomatically, he initially reported dyspnea and pain due to anasarca. These improved as he was decongested. Medication adherence is likely cause of this exacerbation - he does not take his medicine as an outpatient although he follows with cardiology. In this setting, his prognosis was very guarded. Palliative medicine was brought on board, the patient opted to be made DNR. A GOC with family was to be performed on 12/23 but patient became too agitated to remain in hospital until then. He was discharged on the following cardiac regimen: - Entresto 24/26mg  BID - Potassium daily - Spironolactone 25mg  daily - Bumex 4mg  BID - Holding BB Coreg  Chronic wound infection with chronic Osteomyelitis Inguinal fold intertrigo MRI at admission suggested acute on chronic osteomyelitis. Wounds were examined and cared for by wound team, but he was not acutely ill from this infection. Per discussion with patient, he does not want to pursue surgery. While here, we treated with cefepime and daptomycin (initially vanco but changed due to creatinine elevation). He will be discharged on linezolid 600 twice daily and cefadroxil 500 twice daily x 6 weeks.  This was chosen because it was his discharge regimen at his last hospitalization where and blood cultures grew micrococcus gluteus.  At that time there was concern for vegetation demonstrated on TEE on 11/18, however repeat TEE during this hospitalization ruled out any vegetations. 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 He had recent septic osteomyelitis in November, but no surgical indication at that time. He also had intertrigo in the inguinal folds that was treated with nystatin powder. - Blood cultures without growth - Daptomycin and Cefepime as inpatient, linezolid and cefadroxil on discharge -  MRI bilateral feet reveals possible early osteomyelitis of L calcaneus, R 2nd/3rd metatarsals and possible midfoot vs arthropathy   Anasarca, Scrotal Edema Severe scrotal edema impeded placement of urinary catheter for which urology was consulted and achieved. Discomfort via scrotal sling. - US scrotum with possible L hydrocele - Catheter removed and pt left before trial of voiding. Risks were explained to him and he opted to leave without trial.   Uncontrolled Type 2 Diabetes Recent A1c ws 10.3. Likely inconsistent with medicine use. Home regiment is novolog 30 BID. - Semglee 30 daily and SSI   Hypertension Pressures stable. Holding home coreg 6.25 BID. Continue home spironolactone. Started on entresto for GDMT.   DISCHARGE INSTRUCTIONS:   Discharge Instructions     Call MD for:  difficulty breathing, headache or visual disturbances   Complete by: As directed    Call MD for:  extreme fatigue   Complete by: As directed    Call MD for:  persistant dizziness or light-headedness   Complete by: As directed    Call MD for:  persistant nausea and vomiting   Complete by: As directed    Call MD for:  redness, tenderness, or signs  of infection (pain, swelling, redness, odor or green/yellow discharge around incision site)   Complete by: As directed    Call MD for:  temperature >100.4   Complete by: As directed    Increase activity slowly   Complete by: As directed    No wound care   Complete by: As directed        SUBJECTIVE:  Dakota Ramirez very agitated this morning.  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  He is seriously considering leaving AMA.  Not sleep well here, is uncomfortable, and has things to attend to at home. Discharge Vitals:   BP 117/83 (BP Location: Left Arm)   Pulse (!) 119   Temp (!) 97.2 F (36.2 C) (Axillary)   Resp 16   Ht 5\' 11"  (1.803 m)   Wt (!) 138.1 kg   SpO2 98%   BMI 42.46 kg/m    OBJECTIVE:  Physical Exam Constitutional:      General: He is not in acute distress.    Appearance: He is obese. He is not ill-appearing.     Comments: Exam limited due to patient's frustration.  Pulmonary:     Effort: Pulmonary effort is normal.  Abdominal:     Comments: Abdominal swelling greatly reduced from admission. Very little remains.  Musculoskeletal:     Comments: Stable chronic lipodermatosclerosis. Overall swelling is improved.  Skin:    General: Skin is warm and dry.  Neurological:     Mental Status: He is alert and oriented to person, place, and time.  Psychiatric:        Mood and Affect: Mood is anxious.        Behavior: Behavior is agitated.      Pertinent Labs, Studies, and Procedures:     Latest Ref Rng & Units 08/04/2023    2:53 AM 08/03/2023    2:40 AM 08/02/2023    6:49 AM  CBC  WBC 4.0 - 10.5 K/uL 7.6  7.6  7.8   Hemoglobin 13.0 - 17.0 g/dL 04.5  40.9  81.1   Hematocrit 39.0 - 52.0 % 38.0  41.1  42.4   Platelets 150 - 400 K/uL 215  259  245        Latest Ref Rng & Units 08/04/2023    2:53 AM 08/03/2023    2:40 AM 08/02/2023    6:49 AM  CMP  Glucose 70 - 99 mg/dL 914  782  956   BUN 6 - 20 mg/dL 47  41  34   Creatinine 0.61 - 1.24 mg/dL 2.13  0.86  5.78   Sodium 135 - 145 mmol/L 131  129  131   Potassium 3.5 - 5.1 mmol/L 3.7  3.9  3.6   Chloride 98 - 111 mmol/L 87  90  94   CO2 22 - 32 mmol/L 33  33  28   Calcium 8.9 - 10.3 mg/dL 8.2  8.1  8.2     ECHOCARDIOGRAM COMPLETE Result Date: 07/31/2023    ECHOCARDIOGRAM REPORT   Patient Name:   KIERCE CEFALO Date of Exam: 07/31/2023 Medical Rec #:  [id number]    Height:       71.0 in Accession #:    [id number]   Weight:       307.1 lb Date of Birth:  1971/06/13    BSA:          2.530 m Patient Age:  52 years     BP:           126/99 mmHg Patient Gender: M            HR:           120 bpm. Exam Location:  Inpatient Procedure: 2D Echo, Cardiac Doppler, Color Doppler and Intracardiac             Opacification Agent Indications:    R06.02 SOB  History:        Patient has prior history of Echocardiogram examinations, most                 recent 07/01/2023. CHF and Cardiomyopathy, Mitral Valve Disease,                 Signs/Symptoms:Bacteremia; Risk Factors:Diabetes.                 Methamphetamine use.  Sonographer:    Sheralyn Boatman RDCS Referring Phys: [phone] [facility]  Sonographer Comments: Technically difficult study due to poor echo windows and patient is obese. Image acquisition challenging due to patient body habitus. IMPRESSIONS  1. Left ventricular ejection fraction, by estimation, is < 10%%. The left ventricle has severely decreased function. The left ventricle demonstrates global hypokinesis. The left ventricular internal cavity size was severely dilated. Left ventricular diastolic parameters are consistent with Grade III diastolic dysfunction (restrictive).  2. Right ventricular systolic function is severely reduced. The right ventricular size is severely enlarged. There is mildly elevated pulmonary artery systolic pressure. The estimated right ventricular systolic pressure is 43.5 mmHg.  3. Left atrial size was severely dilated.  4. Right atrial size was severely dilated.  5. A small pericardial effusion is present. The pericardial effusion is circumferential. There is no evidence of cardiac tamponade.  6. The mitral valve is normal in structure. Mild mitral valve regurgitation.  7. The aortic valve is tricuspid. Aortic valve regurgitation is not visualized. No aortic stenosis is present.  8. The inferior vena cava is dilated in size with <50% respiratory variability, suggesting right atrial pressure of 15 mmHg. FINDINGS  Left Ventricle: Left ventricular ejection fraction, by estimation, is < 10%%. The left ventricle has severely decreased function. The left ventricle demonstrates global hypokinesis. Definity contrast agent was given IV to delineate the left ventricular endocardial borders. The  left ventricular internal cavity size was severely dilated. There is no left ventricular hypertrophy. Left ventricular diastolic parameters are consistent with Grade III diastolic dysfunction (restrictive). Right Ventricle: The right ventricular size is severely enlarged. No increase in right ventricular wall thickness. Right ventricular systolic function is severely reduced. There is mildly elevated pulmonary artery systolic pressure. The tricuspid regurgitant velocity is 2.67 m/s, and with an assumed right atrial pressure of 15 mmHg, the estimated right ventricular systolic pressure is 43.5 mmHg. Left Atrium: Left atrial size was severely dilated. Right Atrium: Right atrial size was severely dilated. Pericardium: A small pericardial effusion is present. The pericardial effusion is circumferential. There is no evidence of cardiac tamponade. Mitral Valve: The mitral valve is normal in structure. Mild mitral valve regurgitation. Tricuspid Valve: The tricuspid valve is normal in structure. Tricuspid valve regurgitation is mild. Aortic Valve: The aortic valve is tricuspid. Aortic valve regurgitation is not visualized. No aortic stenosis is present. Pulmonic Valve: The pulmonic valve was grossly normal. Pulmonic valve regurgitation is not visualized. Aorta: The aortic root and ascending aorta are structurally normal, with no evidence of dilitation. Venous: The inferior vena cava is dilated  in size with less than 50% respiratory variability, suggesting right atrial pressure of 15 mmHg. IAS/Shunts: No atrial level shunt detected by color flow Doppler.  LEFT VENTRICLE PLAX 2D LVIDd:         6.50 cm      Diastology LVIDs:         6.00 cm      LV e' medial:    2.39 cm/s LV PW:         1.20 cm      LV E/e' medial:  48.3 LV IVS:        1.40 cm      LV e' lateral:   11.40 cm/s LVOT diam:     2.40 cm      LV E/e' lateral: 10.1 LV SV:         33 LV SV Index:   13 LVOT Area:     4.52 cm  LV Volumes (MOD) LV vol d, MOD A2C: 236.0  ml LV vol d, MOD A4C: 285.0 ml LV vol s, MOD A2C: 202.0 ml LV vol s, MOD A4C: 255.0 ml LV SV MOD A2C:     34.0 ml LV SV MOD A4C:     285.0 ml LV SV MOD BP:      43.2 ml RIGHT VENTRICLE            IVC RV S prime:     6.31 cm/s  IVC diam: 3.10 cm TAPSE (M-mode): 1.2 cm LEFT ATRIUM              Index        RIGHT ATRIUM           Index LA diam:        5.80 cm  2.29 cm/m   RA Area:     31.50 cm LA Vol (A2C):   142.0 ml 56.12 ml/m  RA Volume:   131.00 ml 51.78 ml/m LA Vol (A4C):   107.0 ml 42.29 ml/m LA Biplane Vol: 126.0 ml 49.80 ml/m  AORTIC VALVE             PULMONIC VALVE LVOT Vmax:   59.40 cm/s  PR End Diast Vel: 2.08 msec LVOT Vmean:  38.300 cm/s LVOT VTI:    0.074 m  AORTA Ao Root diam: 3.10 cm Ao Asc diam:  3.60 cm MITRAL VALVE                  TRICUSPID VALVE MV Area (PHT): 5.54 cm       TR Peak grad:   28.5 mmHg MV Decel Time: 137 msec       TR Vmax:        267.00 cm/s Dakota Peak grad:    89.1 mmHg Dakota Mean grad:    53.0 mmHg    SHUNTS Dakota Vmax:         472.00 cm/s  Systemic VTI:  0.07 m Dakota Vmean:        335.0 cm/s   Systemic Diam: 2.40 cm Dakota PISA:         1.57 cm Dakota PISA Eff ROA: 15 mm Dakota PISA Radius:  0.50 cm MV E velocity: 115.50 cm/s Arvilla Meres MD Electronically signed by Arvilla Meres MD Signature Date/Time: 07/31/2023/2:22:37 PM    Final    Dakota FOOT LEFT WO CONTRAST Result Date: 07/31/2023 CLINICAL DATA:  Heel wound EXAM: MRI OF THE LEFT HINDFOOT (ANKLE) WITHOUT CONTRAST TECHNIQUE: Multiplanar, multisequence Dakota imaging of the ankle was performed.  No intravenous contrast was administered. COMPARISON:  07/30/2023 FINDINGS: TENDONS Peroneal: Unremarkable Posteromedial: Mild tibialis posterior tenosynovitis. Mild flexor hallucis longus tenosynovitis. Anterior: Unremarkable Achilles: Unremarkable Plantar Fascia: Plantar ulceration underlying the plantar fascia, with severe thickening and indistinctness of the medial band of the plantar fascia which may be ruptured in the vicinity of the ulcer.  LIGAMENTS Lateral: Unremarkable Medial: Unremarkable CARTILAGE Ankle Joint: Unremarkable Subtalar Joints/Sinus Tarsi: Unremarkable. Incidental large os trigonum. Bones: Abnormal patchy marrow edema in the calcaneus especially posteriorly in the vicinity of the ulceration, concerning for early osteomyelitis. This is especially notable in the vicinity of the plantar calcaneal spur. Other: Deep plantar ulceration just below the plantar calcaneal spur and medial band of the plantar fascia as noted above. IMPRESSION: 1. Deep plantar ulceration underlying the plantar fascia, with severe thickening and indistinctness of the medial band of the plantar fascia which may be ruptured in the vicinity of the ulcer. 2. Abnormal patchy marrow edema in the calcaneus especially posteriorly in the vicinity of the ulceration, concerning for early osteomyelitis. 3. Mild tibialis posterior and flexor hallucis longus tenosynovitis. Electronically Signed   By: Gaylyn Rong M.D.   On: 07/31/2023 08:52   Dakota FOOT RIGHT WO CONTRAST Result Date: 07/31/2023 CLINICAL DATA:  Transmetatarsal amputation, suspicion for osteomyelitis EXAM: MRI OF THE RIGHT HINDFOOT WITHOUT CONTRAST TECHNIQUE: Multiplanar, multisequence Dakota imaging of the right hindfoot was performed. No intravenous contrast was administered. The patient refused IV contrast. COMPARISON:  Radiographs 07/30/2023 FINDINGS: Bones/Joint/Cartilage Remote transmetatarsal amputation. Marrow edema in the remaining diaphysis and proximal metaphysis of the second metatarsal suspicious for osteomyelitis. Patchy marrow edema in the navicular, cuboid, and lateral cuneiform proximally, potentially from stress reaction/arthropathy or early osteomyelitis. Trace edema distally in the remaining diaphysis of the third metatarsal suspicious for low-grade osteomyelitis. 6 mm non-fragmented osteochondral lesion along the lateral talar dome. Ligaments Lisfranc ligament intact. Lateral ligamentous  complex and deltoid ligament grossly intact. Muscles and Tendons Diffuse low-level edema and regional musculature, likely neurogenic. Soft tissues No drainable abscess. Cutaneous thickening/irregularity along the amputation site with some speckled low signal intensity along the plantar margin of the amputation site for example on image 30 series 7 suggesting small amount of gas in the soft tissues, although poorly corroborated in other imaging planes. IMPRESSION: 1. Remote transmetatarsal amputation. 2. Marrow edema in the remaining diaphysis and proximal metaphysis of the second metatarsal suspicious for osteomyelitis. 3. Patchy marrow edema in the navicular, cuboid, and lateral cuneiform proximally, potentially from stress reaction/arthropathy or early osteomyelitis. 4. Trace edema distally in the remaining diaphysis of the third metatarsal suspicious for low-grade osteomyelitis. 5. 6 mm non-fragmented osteochondral lesion along the lateral talar dome. 6. Diffuse low-level edema in regional musculature, likely neurogenic. 7. Cutaneous thickening/irregularity along the amputation site with some speckled low signal intensity along the plantar margin of the amputation site suggesting small amount of gas in the soft tissues, although poorly corroborated in other imaging planes. Electronically Signed   By: Gaylyn Rong M.D.   On: 07/31/2023 08:48   VAS [place] ABI WITH/WO TBI Result Date: 07/30/2023  LOWER EXTREMITY DOPPLER STUDY Patient Name:  KENTRAL SEPTER  Date of Exam:   07/30/2023 Medical Rec #: [id number]     Accession #:    [id number] Date of Birth: [birth year]     Patient Gender: M Patient Age:   23 years Exam Location:  [facility] Procedure:      VAS [place] ABI WITH/WO TBI Referring Phys: JEFFREY HATCHER --------------------------------------------------------------------------------  Indications:  Claudication, and peripheral artery disease. High Risk Factors: Hypertension, Diabetes, past history of  smoking.  Limitations: Today's exam was limited due to involuntary patient movement. Comparison Study: No significant changes seen since prior exam 12/28/22 Performing Technologist: Shona Simpson  Examination Guidelines: A complete evaluation includes at minimum, Doppler waveform signals and systolic blood pressure reading at the level of bilateral brachial, anterior tibial, and posterior tibial arteries, when vessel segments are accessible. Bilateral testing is considered an integral part of a complete examination. Photoelectric Plethysmograph (PPG) waveforms and toe systolic pressure readings are included as required and additional duplex testing as needed. Limited examinations for reoccurring indications may be performed as noted.  ABI Findings: +--------+------------------+-----+-----------+--------+ Right   Rt Pressure (mmHg)IndexWaveform   Comment  +--------+------------------+-----+-----------+--------+ [id number]                    triphasic           +--------+------------------+-----+-----------+--------+ PTA     165               1.20 multiphasic         +--------+------------------+-----+-----------+--------+ DP      145               1.05 monophasic          +--------+------------------+-----+-----------+--------+ +--------+------------------+-----+-----------+-------+ Left    Lt Pressure (mmHg)IndexWaveform   Comment +--------+------------------+-----+-----------+-------+ [id number]                    triphasic          +--------+------------------+-----+-----------+-------+ PTA     159               1.15 multiphasic        +--------+------------------+-----+-----------+-------+ DP      143               1.04 monophasic         +--------+------------------+-----+-----------+-------+ +-------+-----------+-----------+------------+------------+ ABI/TBIToday's ABIToday's TBIPrevious ABIPrevious TBI  +-------+-----------+-----------+------------+------------+ Right  1.2                   1.1                      +-------+-----------+-----------+------------+------------+ Left   1.15                  1.02                     +-------+-----------+-----------+------------+------------+  Bilateral ABIs appear increased compared to prior study on 12/28/22. Unable to obtain TBI due to great toe amputation.  Summary: Right: Resting right ankle-brachial index is within normal range. Unable to obtain TBI due to multiple toe amputations. Left: Resting left ankle-brachial index is within normal range. *See table(s) above for measurements and observations.  Electronically signed by Lemar Livings MD on 07/30/2023 at 6:54:41 PM.    Final    DG Foot Complete Right Result Date: 07/30/2023 CLINICAL DATA:  Foot infection EXAM: RIGHT FOOT COMPLETE - 3 VIEW COMPARISON:  None Available. FINDINGS: Transmetatarsal amputation. Irregular margination of osteotomies involving the first through fourth rays. The fifth ray appears corticated and smoothly contoured. No acute fracture or dislocation. Punctate metallic density near the first metatarsal stump, likely chronic. The soft tissue stone is irregularly marginated with possible fissuring or ulceration medially. IMPRESSION: Transmetatarsal amputation with irregular osteotomies involving the first through fourth rays. The overlying stump is irregular and osteomyelitis may be present and multifocal. Electronically Signed  By: Tiburcio Pea M.D.   On: 07/30/2023 05:46   DG Foot Complete Left Result Date: 07/30/2023 CLINICAL DATA:  Evaluate for infection EXAM: LEFT FOOT - COMPLETE 3+ VIEW COMPARISON:  06/25/2023 FINDINGS: There is diffuse soft tissue edema. No signs of acute fracture or dislocation. Patient is status post first ray amputation at the level of the first MTP joint. There is a large soft tissue defect involving the plantar aspect of the hindfoot along the  undersurface of the calcaneus. Posterior and plantar calcaneal heel spurs. As noted on the previous exam there is a focal cortical defect at the base of the plantar heel spur. The margins of this defect appear more distinct with increase sclerosis. IMPRESSION: 1. Large soft tissue ulcer involving the plantar aspect of the hindfoot along the undersurface of the calcaneus. 2. Focal cortical defect at the base of the plantar heel spur. The margins of this defect appear more distinct with increase sclerosis. Findings are equivocal for osteomyelitis. Consider further evaluation with MRI. 3. Diffuse soft tissue edema. 4. Status post first ray amputation at the level of the first MTP joint. Electronically Signed   By: Signa Kell M.D.   On: 07/30/2023 05:46   US SCROTUM W/DOPPLER Result Date: 07/30/2023 CLINICAL DATA:  Scrotal swelling. EXAM: SCROTAL ULTRASOUND DOPPLER ULTRASOUND OF THE TESTICLES TECHNIQUE: Complete ultrasound examination of the testicles, epididymis, and other scrotal structures was performed. Color and spectral Doppler ultrasound were also utilized to evaluate blood flow to the testicles. COMPARISON:  None Available. FINDINGS: Right testicle Measurements: 3 x 2.2 x 2.3 cm. Scattered microcalcifications. No mass identified. Left testicle Measurements: 2.9 x 2.2 x 2.2 cm. Scattered microcalcifications identified. No mass identified. Right epididymis:  Not visualized Left epididymis:  Not visualized Hydrocele:  Left hydrocele Varicocele:  None visualized. Pulsed Doppler interrogation of both testes demonstrates normal low resistance arterial and venous waveforms bilaterally. Other: Marked scrotal edema identified which diminishes exam detail. Exam further limited by patient discomfort. IMPRESSION: 1. No evidence for testicular mass or torsion. 2. Marked scrotal edema identified which diminishes exam detail. 3. Left hydrocele. 4. Bilateral testicular microlithiasis. Current literature suggests that  testicular microlithiasis is not a significant independent risk factor for development of testicular carcinoma, and that follow up imaging is not warranted in the absence of other risk factors. Monthly testicular self-examination and annual physical exams are considered appropriate surveillance. If patient has other risk factors for testicular carcinoma, then referral to Urology should be considered. (Reference: DeCastro, et al.: A 5-Year Follow up Study of Asymptomatic Men with Testicular Microlithiasis. J Urol 2008; 179:1420-1423.) Electronically Signed   By: Signa Kell M.D.   On: 07/30/2023 05:18   DG CHEST PORT 1 VIEW Result Date: 07/30/2023 CLINICAL DATA:  Dyspnea EXAM: PORTABLE CHEST 1 VIEW COMPARISON:  06/26/2023 FINDINGS: Interval removal of left internal jugular central venous catheter. Pulmonary insufflation is stable. Opacification right costophrenic angle may be artifactual related to overlying soft tissue on this semi erect examination. Lungs are clear. No pneumothorax or pleural effusion. Cardiac size is stably, mildly enlarged. Pulmonary vascularity is normal. Multiple healed left rib fractures are noted. No acute bone abnormality. IMPRESSION: 1. Stable pulmonary insufflation. 2. Mild cardiomegaly. Electronically Signed   By: Helyn Numbers M.D.   On: 07/30/2023 03:08     Signed: Katheran James, DO Internal Medicine Resident, PGY-1 [facility]  Pager: [phone] 12:30 PM, 08/04/2023

## 2023-08-04 NOTE — Plan of Care (Signed)
  Problem: Metabolic: Goal: Ability to maintain appropriate glucose levels will improve Outcome: Progressing   

## 2023-08-05 ENCOUNTER — Encounter: Payer: 59 | Admitting: Student

## 2023-08-05 ENCOUNTER — Telehealth: Payer: Self-pay

## 2023-08-05 NOTE — Transitions of Care (Post Inpatient/ED Visit) (Signed)
   08/05/2023  Name: Dakota Ramirez MRN: [id number] DOB: November 03, 1970  Today's TOC FU Call Status: Today's TOC FU Call Status:: Unsuccessful Call (1st Attempt) Unsuccessful Call (1st Attempt) Date: 08/05/23  Attempted to reach the patient regarding the most recent Inpatient/ED visit.  Follow Up Plan: Additional outreach attempts will be made to reach the patient to complete the Transitions of Care (Post Inpatient/ED visit) call.  Patient was in a restaurant with his friend and requested a call back tomorrow.  Jodelle Gross RN, BSN, CCM RN Care Manager  Transitions of Care  [facility] - [facility]  [phone]

## 2023-08-05 NOTE — Progress Notes (Deleted)
Established Patient Office Visit  Subjective   Patient ID: Dakota Ramirez, male    DOB: 1971/05/10  Age: 52 y.o. MRN: [id number]  No chief complaint on file.   HPI  This is a 52 year old male living with a history stated below and presents today for HFU. Please see problem based assessment and plan for additional details.  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 01/10/2023   Diabetic osteomyelitis (HCC) 12/28/2022   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 to breakdown of skin (HCC) 12/28/2022   Severe protein-calorie malnutrition (HCC) 12/28/2022   Acute on chronic systolic heart failure (HCC) 12/28/2022   Elevated alkaline phosphatase level 12/27/2022   Acute on chronic congestive heart failure (HCC) 12/26/2022   Hyponatremia 11/29/2021   PVD (peripheral vascular disease) (HCC) 11/29/2021   Class 2 obesity 11/29/2021   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) 11/26/2021   Diabetic foot ulcer with osteomyelitis (HCC) 11/26/2021   Essential hypertension 11/26/2021   Past Medical History:  Diagnosis Date   CHF (congestive heart failure) (HCC)    Diabetes mellitus without complication (HCC)    Type II   Heart failure with reduced ejection fraction (HCC) 11/26/2021   Hypertension    Sepsis with encephalopathy and septic shock (HCC) 06/26/2023   Shock (HCC) 06/26/2023   Past Surgical History:  Procedure Laterality Date   AMPUTATION Left 01/25/2023   Procedure: LEFT  GREAT TOE AMPUTATION AND DEBRIDEMENT OF HEEL;  Surgeon: Nadara Mustard, MD;  Location: [facility] OR;  Service: Orthopedics;  Laterality: Left;   BACK SURGERY     CHOLECYSTECTOMY     RIGHT/LEFT HEART CATH AND CORONARY ANGIOGRAPHY N/A 11/30/2021   Procedure: RIGHT/LEFT HEART CATH AND CORONARY ANGIOGRAPHY;  Surgeon: Dolores Patty, MD;  Location: [facility] INVASIVE CV LAB;  Service: Cardiovascular;  Laterality: N/A;   TOE AMPUTATION Right    all toes on RT foot   TRANSESOPHAGEAL ECHOCARDIOGRAM (CATH LAB) N/A 07/01/2023   Procedure: TRANSESOPHAGEAL ECHOCARDIOGRAM;  Surgeon: Romie Minus, MD;  Location: [facility] INVASIVE CV LAB;  Service: Cardiovascular;  Laterality: N/A;   Social History   Tobacco Use   Smoking status: Never   Smokeless tobacco: Never  Vaping Use   Vaping status: Never Used  Substance Use Topics   Alcohol use: Not Currently    Comment: rare   Drug use: Never   Social History   Socioeconomic History   Marital status: Single    Spouse name: Not on file   Number of children: Not on file   Years of education: Not on file   Highest education level: Not on file  Occupational History   Not on file  Tobacco Use   Smoking status: Never   Smokeless tobacco: Never  Vaping Use   Vaping status: Never Used  Substance and Sexual Activity   Alcohol use: Not Currently    Comment: rare   Drug use: Never   Sexual activity: Not on file  Other Topics Concern   Not on file  Social History  Narrative   Not on file   Social Drivers of Health   Financial Resource Strain: High Risk (01/10/2023)   Overall Financial Resource Strain (CARDIA)    Difficulty of Paying Living Expenses: Hard  Food Insecurity: Food Insecurity Present (12/28/2022)   Hunger Vital Sign    Worried About Running Out of Food in the Last Year: Often true    Ran Out of Food in the Last Year: Often true  Transportation Needs: No Transportation Needs (11/29/2021)   PRAPARE - Administrator, Civil Service  (Medical): No    Lack of Transportation (Non-Medical): No  Physical Activity: Not on file  Stress: No Stress Concern Present (02/28/2021)   Received from [facility], [facility] of Occupational Health - Occupational Stress Questionnaire    Feeling of Stress : Not at all  Social Connections: Unknown (12/15/2021)   Received from [facility], [facility]   Social Network    Social Network: Not on file  Intimate Partner Violence: Unknown (11/14/2021)   Received from [facility], [facility]   HITS    Physically Hurt: Not on file    Insult or Talk Down To: Not on file    Threaten Physical Harm: Not on file    Scream or Curse: Not on file   Family Status  Relation Name Status   Mother  (Not Specified)  No partnership data on file   Family History  Problem Relation Age of Onset   Diabetes Mother    Hypertension Mother    No Known Allergies  ROS    Objective:     There were no vitals taken for this visit. BP Readings from Last 3 Encounters:  08/04/23 117/83  07/24/23 (!) 124/90  07/15/23 104/82   Wt Readings from Last 3 Encounters:  08/03/23 (!) 304 lb 7.3 oz (138.1 kg)  07/24/23 (!) 308 lb 3.2 oz (139.8 kg)  07/15/23 285 lb 3.2 oz (129.4 kg)   SpO2 Readings from Last 3 Encounters:  08/04/23 98%  07/24/23 99%  07/15/23 100%      Physical Exam   No results found for any visits on 08/05/23.  Last CBC Lab Results  Component Value Date   WBC 7.6 08/04/2023   HGB 12.3 (L) 08/04/2023   HCT 38.0 (L) 08/04/2023   MCV 84.6 08/04/2023   MCH 27.4 08/04/2023   RDW 17.5 (H) 08/04/2023   PLT 215 08/04/2023   Last metabolic panel Lab Results  Component Value Date   GLUCOSE 151 (H) 08/04/2023   NA 131 (L) 08/04/2023   K 3.7 08/04/2023   CL 87 (L) 08/04/2023   CO2 33 (H) 08/04/2023   BUN 47 (H) 08/04/2023   CREATININE 1.26 (H) 08/04/2023   GFRNONAA >60 08/04/2023   CALCIUM 8.2 (L) 08/04/2023   PHOS 2.1 (L) 07/01/2023   PROT 8.1  07/30/2023   ALBUMIN 1.8 (L) 07/30/2023   BILITOT 1.0 07/30/2023   ALKPHOS 174 (H) 07/30/2023   AST 30 07/30/2023   ALT 18 07/30/2023   ANIONGAP 11 08/04/2023   Last lipids Lab Results  Component Value Date   CHOL 93 11/28/2021   HDL 26 (L) 11/28/2021   LDLCALC 53 11/28/2021   TRIG 72 11/28/2021   CHOLHDL 3.6 11/28/2021   Last hemoglobin A1c Lab Results  Component Value Date   HGBA1C 10.3 (H) 06/26/2023      The ASCVD Risk score (Arnett DK, et al., 2019) failed to calculate for the following  reasons:   The valid total cholesterol range is 130 to 320 mg/dL    Assessment & Plan:   Admitted on 07/30/2023 Discharged on 08/04/2023 Primary problem: Acute on chronic systolic heart failure.  Sherryll Burger 24/26mg  BID - Potassium daily - Spironolactone 25mg  daily - Bumex 4mg  BID - Hold BB given heart failure/concern for shock - Hold SGLT2i given uncontrolled diabetes/concern for infection  - dry weight is 250lbs    Acute on chornic osteomyelitis, bilateral foot wounds- - he was discharged with: Linezolid 600 BID x 6 weeks Cefadroxil 500 BID x 6 weeks   Diabetes Lab Results  Component Value Date   HGBA1C 10.3 (H) 06/26/2023   Home medication novolog 30 BID.   Problem List Items Addressed This Visit   None   No follow-ups on file.    Jeral Pinch, DO

## 2023-08-06 ENCOUNTER — Telehealth: Payer: Self-pay

## 2023-08-06 NOTE — Transitions of Care (Post Inpatient/ED Visit) (Signed)
   08/06/2023  Name: Dakota Ramirez MRN: [id number] DOB: May 13, 1971  Today's TOC FU Call Status: Today's TOC FU Call Status:: Unsuccessful Call (2nd Attempt) Unsuccessful Call (2nd Attempt) Date: 08/06/23  Attempted to reach the patient regarding the most recent Inpatient/ED visit.  Follow Up Plan: Additional outreach attempts will be made to reach the patient to complete the Transitions of Care (Post Inpatient/ED visit) call.   Jodelle Gross RN, BSN, CCM RN Care Manager  Transitions of Care  [facility] - [facility]  [phone]

## 2023-08-09 ENCOUNTER — Telehealth: Payer: Self-pay

## 2023-08-09 NOTE — Telephone Encounter (Signed)
Return call to [place] PT with [facility] who stated pt does not want PT. The pt would not allow PT to enter into his house. And he even yelled out her at one point. Pt told PT he does not need PT. The pt told her he's in a lot of pain and he had hit his right foot - she advised him to go to the ER but he refuse. Pt did agreed to nurse eval. PT requesting verbal order for "Nurse evaluation for next week to address pain, assess skin on buttocks (raw per pt) and assess CHF.diabetes". VO was given - sending to the [employer] for approval or denial.

## 2023-08-09 NOTE — Telephone Encounter (Signed)
Charr[place]e with [facility] requesting VO for skilled nursing and start of care date for next week. States pt refused PT and OT at this time. Please call back @ [phone].

## 2023-08-12 ENCOUNTER — Telehealth (HOSPITAL_COMMUNITY): Payer: Self-pay | Admitting: Licensed Clinical Social Worker

## 2023-08-12 ENCOUNTER — Telehealth: Payer: Self-pay

## 2023-08-12 NOTE — Telephone Encounter (Signed)
CSW attempted to call pt to discuss referral to [facility] paramedicine program- unable to reach- phone went to VM of woman who was not listed on his contact sheet so did not leave VM at this time  Put note in to see patient in clinic next week to discuss at that time  Burna Sis, LCSW Clinical Social Worker [facility] Desk#: [phone] Cell#: [phone]

## 2023-08-12 NOTE — Transitions of Care (Post Inpatient/ED Visit) (Signed)
   08/12/2023  Name: Dakota Ramirez MRN: [id number] DOB: 08/18/1970  Today's TOC FU Call Status: Today's TOC FU Call Status:: Unsuccessful Call (3rd Attempt) Unsuccessful Call (3rd Attempt) Date: 08/12/23  Attempted to reach the patient regarding the most recent Inpatient/ED visit.  Follow Up Plan: No further outreach attempts will be made at this time. We have been unable to contact the patient.  Jodelle Gross RN, BSN, CCM RN Care Manager  Transitions of Care  [facility] - [id number]  [phone]

## 2023-08-14 DIAGNOSIS — Z419 Encounter for procedure for purposes other than remedying health state, unspecified: Secondary | ICD-10-CM | POA: Diagnosis not present

## 2023-08-15 ENCOUNTER — Telehealth: Payer: Self-pay | Admitting: Internal Medicine

## 2023-08-15 NOTE — Telephone Encounter (Signed)
 Entered in error

## 2023-08-15 NOTE — Telephone Encounter (Signed)
 Spoke with Powell of [facility]. She came to this patient's home to do a start-visit today, however, she states that [facility] cannot admit him to their services due to his living conditions. She states that he lives in a camper behind his family member's home, but the camper does not have running water. It is also infested with bugs and garbage, thus, the home health team cannot do his wound care there. She filed a report with [employer] today.

## 2023-08-19 ENCOUNTER — Encounter (HOSPITAL_COMMUNITY): Payer: Self-pay | Admitting: Internal Medicine

## 2023-08-19 ENCOUNTER — Ambulatory Visit (HOSPITAL_COMMUNITY)
Admission: RE | Admit: 2023-08-19 | Discharge: 2023-08-19 | Disposition: A | Discharge status: Home / Self Care | Payer: 59 | Source: Ambulatory Visit | Attending: Internal Medicine | Admitting: Internal Medicine

## 2023-08-19 VITALS — BP 118/80 | HR 124 | Wt 252.4 lb

## 2023-08-19 DIAGNOSIS — S91309A Unspecified open wound, unspecified foot, initial encounter: Secondary | ICD-10-CM

## 2023-08-19 DIAGNOSIS — I5022 Chronic systolic (congestive) heart failure: Secondary | ICD-10-CM | POA: Diagnosis not present

## 2023-08-19 DIAGNOSIS — N179 Acute kidney failure, unspecified: Secondary | ICD-10-CM

## 2023-08-19 LAB — BASIC METABOLIC PANEL
Anion gap: 13 (ref 5–15)
BUN: 21 mg/dL — ABNORMAL HIGH (ref 6–20)
CO2: 28 mmol/L (ref 22–32)
Calcium: 7.7 mg/dL — ABNORMAL LOW (ref 8.9–10.3)
Chloride: 95 mmol/L — ABNORMAL LOW (ref 98–111)
Creatinine, Ser: 1 mg/dL (ref 0.61–1.24)
GFR, Estimated: >60 mL/min (ref >60)
Glucose, Bld: 212 mg/dL — ABNORMAL HIGH (ref 70–99)
Potassium: 3.8 mmol/L (ref 3.5–5.1)
Sodium: 136 mmol/L (ref 135–145)

## 2023-08-19 LAB — BRAIN NATRIURETIC PEPTIDE: B Natriuretic Peptide: >4500 pg/mL — ABNORMAL HIGH (ref 0.0–100.0)

## 2023-08-19 MED ORDER — DIGOXIN 125 MCG PO TABS: 0.1250 mg | ORAL_TABLET | Freq: Every day | ORAL | 3 refills | Status: DC

## 2023-08-19 NOTE — Progress Notes (Signed)
 ADVANCED HF CLINIC NOTE   Primary Care: [facility] Dept. HF Cardiologist: Dr. Cherrie  HPI: Dakota Ramirez is a 53 y.o. 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    Admitted 4/23 with new-onset acute HF.  Given IV lasix . Echo EF 20-25%, regional WMA, moderately dilated LV, RV moderately reduced, RVSP 40 mmHg, mild to moderate MR, dilated IVC with estimated RAP 15 mmHg. Cards consulted and GDMT started. 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  Overall diuresed 50 lbs. No SGLT2i with uncontrolled DM. Discharged home, weight 259 lbs.   cMRI 4/24 showed severe biventricular failure, LVEF 17%. RVEF 22% LGE imaging very poor quality, which limits interpretation. 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   Follow up 12/26/22, NYHA IIIb, markedly volume overload weight up 40 lbs, ReDs 42%. Advised to go to ED for evaluation, where he was admitted. Diuresed with IV lasix . Echo showed EF <20%, LV with GHK, RV mildly reduced, LA/RA sev dilated, mild-mod MR, mild TR, trivial pericardial effusion. GDMT titrated. Dr Harden consulted for chronic osteomyelitis and Unna boot placed on RLE, no indication for surgical intervention.  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  Admitted 06/25/23 with mixed cardiogenic and septic shock secondary to LE cellulitis/wounds w/ ? osteomyelitis. He required pressor support with norepinephrine . UDS + for amphetamines. Advanced heart failure consulted. Diuresed with IV lasix  and GDMT slowly added back. Echo with EF 20-25%, RV mildly reduced, new mobile density on posterior mitral valve leaflet. Subsequently had TEE which did not show any evidence of vegetation. ID consulted. 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 Additional imaging to  foot wounds also suggested. He refused to stay for additional antibiotics and treatment. Not a candidate for home antibiotics and PICC line d/t substance abuse.   Post hospital follow up 11/24, NYHA IIIb and volume overloaded. Concern for CGS. Diuretics increased with close follow up. He was unwilling to be re-admitted.  Admitted for HF/shock and massive overload from 12/17 - 08/04/23. Diuresed 60 pounds. D/c weight 253 pounds. Also treated for osteomyelitis Linezolid  600 BID x 6 weeks Cefadroxil  500 BID x 6 weeks  Here for f/u. Feels bad. Weak. Struggles with ADLs Nauseated. Stomach hurts, Says he thinks it is due to the abx. Breathing ok. Compliant with meds     Cardiac Studies: - TEE (11/24): no evidence of vegetation.  - Echo (11/24): EF 20-25%, RV mildly reduced, new mobile density on posterior mitral valve leaflet.  - Echo (5/24): EF < 20%, LV with GHK, RV mildly reduced, LA/RA sev dilated, mild-mod MR, mild TR, trivial pericardial effusion.   - Bedside echo (9/23): EF 20-25%  - Echo (4/23): EF 20-25%, regional WMA, moderately dilated LV, RV moderately reduced, RVSP 40 mmHg, mild to moderate MR, dilated IVC with estimated RAP 15 mmHg.  - R/LHC (4/23):    The left ventricular ejection fraction is less than 25% by visual estimate.  Ao = 97/67 (85) LV = 105/25 RA = 8 RV = 37/14 PA = 35/22 (25) PCW = 20 Fick cardiac output/index = 5.9/2.4 PVR = 0.8 WU SVR = 1040 Ao sat = 97% PA sat = 66%, 67%   1. Normal coronary arteries 2. 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 Mildly elevated filling pressures with normal cardiac output  cMRI (4/24): poor quality, LVEF  17%, RVEF 22%,  appears to have RV insertion site LGE (which is a nonspecific scar finding often seen in setting of elevated pulmonary pressures) and basal septal midwall LGE (which is a scar pattern seen in NICM and associated with worse prognosis)   Past Medical History:  Diagnosis Date   CHF (congestive heart failure) (HCC)     Diabetes mellitus without complication (HCC)    Type II   Heart failure with reduced ejection fraction (HCC) 11/26/2021   Hypertension    Sepsis with encephalopathy and septic shock (HCC) 06/26/2023   Shock (HCC) 06/26/2023   Current Outpatient Medications  Medication Sig Dispense Refill   bumetanide  (BUMEX ) 2 MG tablet Take 2 tablets (4 mg total) by mouth 2 (two) times daily. 120 tablet 3   cefadroxil  (DURICEF) 500 MG capsule Take 1 capsule (500 mg total) by mouth 2 (two) times daily. 84 capsule 0   digoxin  (LANOXIN ) 0.125 MG tablet Take 1 tablet (0.125 mg total) by mouth daily. 90 tablet 3   linezolid  (ZYVOX ) 600 MG tablet Take 1 tablet (600 mg total) by mouth 2 (two) times daily. 84 tablet 0   NOVOLOG  MIX 70/30 FLEXPEN (70-30) 100 UNIT/ML FlexPen Inject 30 Units into the skin 2 (two) times daily with a meal. 15 mL 3   potassium chloride  SA (KLOR-CON  M) 20 MEQ tablet Take 2 tablets (40 mEq total) by mouth daily. Take extra 40 meq on day you take metolazone . Take extra 40 mg on days you use Furoxcix. 120 tablet 5   sacubitril -valsartan  (ENTRESTO ) 24-26 MG Take 1 tablet by mouth 2 (two) times daily. 180 tablet 3   spironolactone  (ALDACTONE ) 25 MG tablet Take 1 tablet (25 mg total) by mouth daily. 90 tablet 3   No current facility-administered medications for this encounter.   No Known Allergies  Social History   Socioeconomic History   Marital status: Single    Spouse name: Not on file   Number of children: Not on file   Years of education: Not on file   Highest education level: Not on file  Occupational History   Not on file  Tobacco Use   Smoking status: Never   Smokeless tobacco: Never  Vaping Use   Vaping status: Never Used  Substance and Sexual Activity   Alcohol  use: Not Currently    Comment: rare   Drug use: Never   Sexual activity: Not on file  Other Topics Concern   Not on file  Social History Narrative   Not on file   Social Drivers of Health   Financial  Resource Strain: High Risk (01/10/2023)   Overall Financial Resource Strain (CARDIA)    Difficulty of Paying Living Expenses: Hard  Food Insecurity: Food Insecurity Present (12/28/2022)   Hunger Vital Sign    Worried About Running Out of Food in the Last Year: Often true    Ran Out of Food in the Last Year: Often true  Transportation Needs: No Transportation Needs (11/29/2021)   PRAPARE - Administrator, Civil Service (Medical): No    Lack of Transportation (Non-Medical): No  Physical Activity: Not on file  Stress: No Stress Concern Present (02/28/2021)   Received from [facility] of Occupational Health - Occupational Stress Questionnaire    Feeling of Stress : Not at all  Social Connections: Unknown (12/15/2021)   Received from [facility], [facility]   Social Network    Social Network: Not on file  Intimate Partner Violence: Unknown (11/14/2021)   Received from [facility], [facility]   HITS    Physically Hurt: Not on file    Insult or Talk Down To: Not on file    Threaten Physical Harm: Not on file    Scream or Curse: Not on file   Family History  Problem Relation Age of Onset   Diabetes Mother    Hypertension Mother    BP 118/80   Pulse (!) 124   Wt 114.5 kg (252 lb 6.4 oz)   SpO2 97%   BMI 35.20 kg/m   Wt Readings from Last 3 Encounters:  08/19/23 114.5 kg (252 lb 6.4 oz)  08/03/23 (!) 138.1 kg (304 lb 7.3 oz)  07/24/23 (!) 139.8 kg (308 lb 3.2 oz)   PHYSICAL EXAM: General:  Chronically-ill appearing. No resp difficulty HEENT: normal Neck: supple. Jvp 8-9. Carotids 2+ bilat; no bruits. No lymphadenopathy or thryomegaly appreciated. Cor: PMI nondisplaced. Regular tachy + s3 Lungs: clear Abdomen: soft, nontender, nondistended. No hepatosplenomegaly. No bruits or masses. Good bowel sounds. Extremities: no cyanosis, clubbing, rash, 1+ woody edema edema feet wrapped with wounds Neuro: alert & orientedx3, cranial  nerves grossly intact. moves all 4 extremities w/o difficulty. Affect pleasant  Assessment/Plan:   Acute on Chronic Systolic Heart Failure - NICM. Reports hx CHF in his mother. No hx alcohol  or drug abuse. 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 RVEF 22% LGE imaging very poor quality, which limits interpretation. Appears to have RV insertion site LGE (associated with worse prognosis) - Echo (5/24): EF < 20%, LV with GHK, RV mildly reduced, LA/RA sev dilated, mild-mod MR, mild TR, trivial pericardial effusion.  - Echo 11/24: EF 20-25%, RV mildly reduced, mild MR - He is end-stage. Recent admit 12/24 with 60 pound diuresis - Volume status doesn't look so bad but he is clearly end-stage with advanced symptoms, prominent resting tachycardia and low BP - Not a candidate for advanced therapies given chronic wounds and social situation - I offered Palliative services to him but he is not interested in having people come to his house - continue current meds.    2. Chronic foot wounds - Treated for cellulitis/osteomyelitis recent admit - Now on home abx - foot wounds are wrapped    2. Uncontrolled DM2 - Most recent A1C 10.3 - On insulin  - Per PCP   3. Hx osteomyelitis s/p right TMA - 2/2 poorly controlled DM - on abx as above.   4. Recent AKI - Scr peaked at 4.8 during recent admit in setting of shock - Improved to baseline at discharge. Scr 1.26  - Labs today   7. SDOH  - Now has medicaid - Lives in a camper behind father's house. Unemployed and has limited social support. - Declined Paramedicine and Palliative support. Discussed again today  I spent a total of 45 minutes today: 1) reviewing the  patient's medical records including previous charts, labs and recent notes from other providers; 2) examining the patient and counseling them on their medical issues/explaining the plan of care; 3) adjusting meds as needed and 4) ordering lab work or other needed tests.    Toribio Fuel, MD  5:13 PM

## 2023-08-19 NOTE — Patient Instructions (Signed)
 Medication Changes:  START Digoxin  0.125 mg Daily  Lab Work:  Labs done today, your results will be available in MyChart, we will contact you for abnormal readings.  Special Instructions // Education:  Do the following things EVERYDAY: Weigh yourself in the morning before breakfast. Write it down and keep it in a log. Take your medicines as prescribed Eat low salt foods--Limit salt (sodium) to 2000 mg per day.  Stay as active as you can everyday Limit all fluids for the day to less than 2 liters   Follow-Up in: 3 weeks   At the [facility], you and your health needs are our priority. We have a designated team specialized in the treatment of Heart Failure. This Care Team includes your primary Heart Failure Specialized Cardiologist (physician), Advanced Practice Providers (APPs- Physician Assistants and Nurse Practitioners), and Pharmacist who all work together to provide you with the care you need, when you need it.   You may see any of the following providers on your designated Care Team at your next follow up:  Dr. Toribio Fuel Dr. Ezra Shuck Dr. Ria Commander Dr. Odis Brownie Greig Mosses, NP Caffie Shed, [place] [facility] [place], [place] Beckey Coe, NP Jordan Lee, NP Tinnie Redman, PharmD   Please be sure to bring in all your medications bottles to every appointment.   Need to Contact Us :  If you have any questions or concerns before your next appointment please send us  a message through [place] or call our office at [phone].    TO LEAVE A MESSAGE FOR THE NURSE SELECT OPTION 2, PLEASE LEAVE A MESSAGE INCLUDING: YOUR NAME DATE OF BIRTH CALL BACK NUMBER REASON FOR CALL**this is important as we prioritize the call backs  YOU WILL RECEIVE A CALL BACK THE SAME DAY AS LONG AS YOU CALL BEFORE 4:00 PM

## 2023-08-21 ENCOUNTER — Ambulatory Visit: Payer: 59 | Admitting: Infectious Diseases

## 2023-08-22 ENCOUNTER — Telehealth: Payer: Self-pay

## 2023-08-22 NOTE — Progress Notes (Signed)
..   Medicaid Managed Care   Unsuccessful Outreach Note  08/22/2023 Name: Dakota Ramirez MRN: [id number] DOB: 1971/02/23  Referred by: Gregary Sharper, MD Reason for referral : High Risk Managed Medicaid (I called the patient today to get him scheduled with the MM Team. He did not answer and the VM was full.)   An unsuccessful telephone outreach was attempted today. The patient was referred to the case management team for assistance with care management and care coordination.   Follow Up Plan: The care management team will reach out to the patient again over the next 7 days.   Delon Kleine Care Guide  [facility] Managed  Care Guide [facility]  [phone]

## 2023-09-10 ENCOUNTER — Telehealth: Payer: Self-pay

## 2023-09-10 DIAGNOSIS — I429 Cardiomyopathy, unspecified: Secondary | ICD-10-CM

## 2023-09-10 DIAGNOSIS — I428 Other cardiomyopathies: Secondary | ICD-10-CM

## 2023-09-10 DIAGNOSIS — I10 Essential (primary) hypertension: Secondary | ICD-10-CM

## 2023-09-12 ENCOUNTER — Encounter (HOSPITAL_COMMUNITY): Payer: Self-pay | Admitting: Internal Medicine

## 2023-09-12 ENCOUNTER — Ambulatory Visit (HOSPITAL_COMMUNITY)
Admission: RE | Admit: 2023-09-12 | Discharge: 2023-09-12 | Disposition: A | Discharge status: Home / Self Care | Payer: 59 | Source: Ambulatory Visit | Attending: Internal Medicine | Admitting: Internal Medicine

## 2023-09-12 VITALS — BP 104/60 | HR 119 | Wt 298.8 lb

## 2023-09-12 DIAGNOSIS — I5022 Chronic systolic (congestive) heart failure: Secondary | ICD-10-CM | POA: Diagnosis not present

## 2023-09-12 DIAGNOSIS — N179 Acute kidney failure, unspecified: Secondary | ICD-10-CM

## 2023-09-12 DIAGNOSIS — E1159 Type 2 diabetes mellitus with other circulatory complications: Secondary | ICD-10-CM

## 2023-09-12 DIAGNOSIS — R000 Tachycardia, unspecified: Secondary | ICD-10-CM | POA: Insufficient documentation

## 2023-09-12 DIAGNOSIS — I5023 Acute on chronic systolic (congestive) heart failure: Secondary | ICD-10-CM

## 2023-09-12 DIAGNOSIS — Z8739 Personal history of other diseases of the musculoskeletal system and connective tissue: Secondary | ICD-10-CM | POA: Diagnosis not present

## 2023-09-12 DIAGNOSIS — Z794 Long term (current) use of insulin: Secondary | ICD-10-CM

## 2023-09-12 MED ORDER — METOLAZONE 5 MG PO TABS: 5.0000 mg | ORAL_TABLET | Freq: Every day | ORAL | 0 refills | Status: DC

## 2023-09-12 NOTE — Progress Notes (Addendum)
 ADVANCED HF CLINIC NOTE   Primary Care: [facility] Dept. HF Cardiologist: Dr. Gala Romney  HPI: Dakota Ramirez is a 53 y.o. 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    Admitted 4/23 with new-onset acute HF.  Given IV lasix. Echo EF 20-25%, regional WMA, moderately dilated LV, RV moderately reduced, RVSP 40 mmHg, mild to moderate MR, dilated IVC with estimated RAP 15 mmHg. Cards consulted and GDMT started. 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  Overall diuresed 50 lbs. No SGLT2i with uncontrolled DM. Discharged home, weight 259 lbs.   cMRI 4/24 showed severe biventricular failure, LVEF 17%. RVEF 22% LGE imaging very poor quality, which limits interpretation. 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   Follow up 12/26/22, NYHA IIIb, markedly volume overload weight up 40 lbs, ReDs 42%. Advised to go to ED for evaluation, where he was admitted. Diuresed with IV lasix. Echo showed EF <20%, LV with GHK, RV mildly reduced, LA/RA sev dilated, mild-mod MR, mild TR, trivial pericardial effusion. GDMT titrated. Dr Lajoyce Corners consulted for chronic osteomyelitis and Unna boot placed on RLE, no indication for surgical intervention.  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  Admitted 06/25/23 with mixed cardiogenic and septic shock secondary to LE cellulitis/wounds w/ ? osteomyelitis. He required pressor support with norepinephrine. UDS + for amphetamines. Advanced heart failure consulted. Diuresed with IV lasix and GDMT slowly added back. Echo with EF 20-25%, RV mildly reduced, new mobile density on posterior mitral valve leaflet. Subsequently had TEE which did not show any evidence of vegetation. ID consulted. 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 Additional imaging to  foot wounds also suggested. He refused to stay for additional antibiotics and treatment. Not a candidate for home antibiotics and PICC line d/t substance abuse.   Post hospital follow up 11/24, NYHA IIIb and volume overloaded. Concern for CGS. Diuretics increased with close follow up. He was unwilling to be re-admitted.  Admitted for HF/shock and massive overload from 12/17 - 08/04/23. Diuresed 60 pounds. D/c weight 253 pounds. Also treated for osteomyelitis Linezolid 600 BID x 6 weeks Cefadroxil 500 BID x 6 weeks  Here for f/u. Continues to feel bad. SOB with any activity. Feels like fluid is coming back. Weight back up at least 30 pounds. + LE edema Taking meds as scheduled. Drinking a lot of fluid. Foot wounds not healed completely    Cardiac Studies: - TEE (11/24): no evidence of vegetation.  - Echo (11/24): EF 20-25%, RV mildly reduced, new mobile density on posterior mitral valve leaflet.  - Echo (5/24): EF < 20%, LV with GHK, RV mildly reduced, LA/RA sev dilated, mild-mod MR, mild TR, trivial pericardial effusion.   - Bedside echo (9/23): EF 20-25%  - Echo (4/23): EF 20-25%, regional WMA, moderately dilated LV, RV moderately reduced, RVSP 40 mmHg, mild to moderate MR, dilated IVC with estimated RAP 15 mmHg.  - R/LHC (4/23):    The left ventricular ejection fraction is less than 25% by visual estimate.  Ao = 97/67 (85) LV = 105/25 RA = 8 RV = 37/14 PA = 35/22 (25) PCW = 20 Fick cardiac output/index = 5.9/2.4 PVR = 0.8 WU SVR = 1040 Ao sat = 97% PA sat = 66%, 67%   1. Normal coronary arteries 2. 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  Mildly elevated filling pressures with normal cardiac output  cMRI (4/24): poor quality, LVEF 17%, RVEF 22%,  appears to have RV insertion site LGE (which is a nonspecific scar finding often seen in setting of elevated pulmonary pressures) and basal septal midwall LGE (which is a scar pattern seen in NICM and associated with worse prognosis)   Past  Medical History:  Diagnosis Date   CHF (congestive heart failure) (HCC)    Diabetes mellitus without complication (HCC)    Type II   Heart failure with reduced ejection fraction (HCC) 11/26/2021   Hypertension    Sepsis with encephalopathy and septic shock (HCC) 06/26/2023   Shock (HCC) 06/26/2023   Current Outpatient Medications  Medication Sig Dispense Refill   digoxin (LANOXIN) 0.125 MG tablet Take 1 tablet (0.125 mg total) by mouth daily. 90 tablet 3   NOVOLOG MIX 70/30 FLEXPEN (70-30) 100 UNIT/ML FlexPen Inject 30 Units into the skin 2 (two) times daily with a meal. 15 mL 3   potassium chloride SA (KLOR-CON M) 20 MEQ tablet Take 2 tablets (40 mEq total) by mouth daily. Take extra 40 meq on day you take metolazone. Take extra 40 mg on days you use Furoxcix. 120 tablet 5   sacubitril-valsartan (ENTRESTO) 24-26 MG Take 1 tablet by mouth 2 (two) times daily. 180 tablet 3   spironolactone (ALDACTONE) 25 MG tablet Take 1 tablet (25 mg total) by mouth daily. 90 tablet 3   magnesium oxide (MAGOX 400) 400 (240 Mg) MG tablet Take 1 tablet (400 mg total) by mouth daily at 12 noon. 90 tablet 3   metolazone (ZAROXOLYN) 5 MG tablet Take 1 tablet (5 mg total) by mouth daily as needed (as instructed by HF clinic for extra volume). Take an extra 2 potassium tablets with your Metolazone     torsemide (DEMADEX) 20 MG tablet Take 4 tablets (80 mg total) by mouth 2 (two) times daily. 120 tablet 3   No current facility-administered medications for this encounter.   No Known Allergies  Social History   Socioeconomic History   Marital status: Single    Spouse name: Not on file   Number of children: Not on file   Years of education: Not on file   Highest education level: Not on file  Occupational History   Not on file  Tobacco Use   Smoking status: Never   Smokeless tobacco: Never  Vaping Use   Vaping status: Never Used  Substance and Sexual Activity   Alcohol use: Not Currently    Comment:  rare   Drug use: Never   Sexual activity: Not on file  Other Topics Concern   Not on file  Social History Narrative   Not on file   Social Drivers of Health   Financial Resource Strain: High Risk (01/10/2023)   Overall Financial Resource Strain (CARDIA)    Difficulty of Paying Living Expenses: Hard  Food Insecurity: Food Insecurity Present (09/16/2023)   Hunger Vital Sign    Worried About Running Out of Food in the Last Year: Never true    Ran Out of Food in the Last Year: Sometimes true  Transportation Needs: No Transportation Needs (09/16/2023)   PRAPARE - Administrator, Civil Service (Medical): No    Lack of Transportation (Non-Medical): No  Physical Activity: Not on file  Stress: No Stress Concern Present (02/28/2021)   [facility]   [facility] of Occupational Health - Occupational Stress Questionnaire  Feeling of Stress : Not at all  Social Connections: Unknown (12/15/2021)   Received from [facility], [facility]   Social Network    Social Network: Not on file  Intimate Partner Violence: Not At Risk (09/16/2023)   Humiliation, Afraid, Rape, and Kick questionnaire    Fear of Current or Ex-Partner: No    Emotionally Abused: No    Physically Abused: No    Sexually Abused: No   Family History  Problem Relation Age of Onset   Diabetes Mother    Hypertension Mother    BP 104/60   Pulse (!) 119   Wt 135.5 kg (298 lb 12.8 oz)   SpO2 96%   BMI 41.67 kg/m   Wt Readings from Last 3 Encounters:  09/20/23 108.7 kg (239 lb 11.2 oz)  09/16/23 136.1 kg (300 lb)  09/12/23 135.5 kg (298 lb 12.8 oz)   PHYSICAL EXAM: General:  Chronically-ill appearing. No resp difficulty HEENT: normal Neck: supple. JVP to ear Carotids 2+ bilat; no bruits. No lymphadenopathy or thryomegaly appreciated. Cor: PMI nondisplaced. Regular rate & rhythm. No rubs, gallops or murmurs. Lungs: clear Abdomen: soft, nontender, ++ distended. No  hepatosplenomegaly. No bruits or masses. Good bowel sounds. Extremities: no cyanosis, clubbing, rash, 3-4+ edema into thighs  feet wrapped Neuro: alert & orientedx3, cranial nerves grossly intact. moves all 4 extremities w/o difficulty. Affect pleasant   Sinus tach 119 low volts Personally reviewed   Assessment/Plan:   Acute on Chronic Systolic Heart Failure - NICM. Reports hx CHF in his mother. No hx alcohol or drug abuse. 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 RVEF 22% LGE imaging very poor quality, which limits interpretation. Appears to have RV insertion site LGE (associated with worse prognosis) - Echo (5/24): EF < 20%, LV with GHK, RV mildly reduced, LA/RA sev dilated, mild-mod MR, mild TR, trivial pericardial effusion.  - Echo 11/24: EF 20-25%, RV mildly reduced, mild MR - Recent admit 12/24 with 60 pound diuresis - He is end-stage now with recurrent massive volume overload - I suspect non-compliance with diuretics and fluid restriction contributing - I suggested hospital re-admission but he refused - Will add metolazone 5mg  daily for 3 days + KCL 40 daily - Will see back in 1 week to reassess respon - Not a candidate for advanced therapies given chronic wounds and social situation - I again offered Palliative services to him but he is not interested in having people come to his house   2. Chronic foot wounds - Treated for cellulitis/osteomyelitis recent admit - Now on home abx - fFOot wounds are wrapped   3. Uncontrolled DM2 - Most recent A1C 10.3 - On insulin - Per PCP   4. Hx osteomyelitis s/p right TMA - 2/2 poorly controlled DM - on abx as above. - Foot wounds wrapped   4.  Recent AKI - Scr peaked at 4.8 during recent admit in setting of shock - Improved to baseline at discharge. Scr 1.26  - Labs today and 1 week   7. SDOH  - Now has medicaid - Lives in a camper behind father's house. Unemployed and has limited social support. - Declined Paramedicine and Palliative support. Discussed again today  I spent a  total of 50 minutes today: 1) reviewing the patient's medical records including previous charts, labs and recent notes from other providers; 2) examining the patient and counseling them on their medical issues/explaining the plan of care; 3) adjusting meds as needed and 4) ordering lab work or other needed tests.    Arvilla Meres, MD  10:10 PM

## 2023-09-12 NOTE — Patient Instructions (Signed)
START Metolazone 5 mg daily for the next 3 days  Take and extra 2 potassium tablets with the Metolazone.  Your physician recommends that you schedule a follow-up appointment on Monday afternoon at 1.40 pm.   PLEASE EITHER PARK IN THE PARKING GARAGE THE CODE IS 1127 OR YOU CAN USE THE VALET PARKING.  If you have any questions or concerns before your next appointment please send [place] a message through [place] or call our office at [phone].    TO LEAVE A MESSAGE FOR THE NURSE SELECT OPTION 2, PLEASE LEAVE A MESSAGE INCLUDING: YOUR NAME DATE OF BIRTH CALL BACK NUMBER REASON FOR CALL**this is important as we prioritize the call backs  YOU WILL RECEIVE A CALL BACK THE SAME DAY AS LONG AS YOU CALL BEFORE 4:00 PM  At the [facility], you and your health needs are our priority. As part of our continuing mission to provide you with exceptional heart care, we have created designated Provider Care Teams. These Care Teams include your primary Cardiologist (physician) and Advanced Practice Providers (APPs- Physician Assistants and Nurse Practitioners) who all work together to provide you with the care you need, when you need it.   You may see any of the following providers on your designated Care Team at your next follow up: Dr Arvilla Meres Dr Marca Ancona Dr. Dorthula Nettles Dr. Clearnce Hasten Amy Filbert Schilder, NP Robbie Lis, [place] [facility] [place], [place] Brynda Peon, NP [place] Lee, NP Karle Plumber, PharmD   Please be sure to bring in all your medications bottles to every appointment.    Thank you for choosing [facility]

## 2023-09-13 ENCOUNTER — Other Ambulatory Visit: Payer: Self-pay | Admitting: *Deleted

## 2023-09-13 NOTE — Progress Notes (Signed)
H&V Care Navigation CSW Progress Note  Clinical Social Worker met with pt to discuss recommendation for [employer].  Patient had them previously and does not like people coming out to his home- is not agreeable to referral at this time.  Burna Sis, LCSW Clinical Social Worker [facility] Desk#: [phone] Cell#: [phone]

## 2023-09-13 NOTE — Patient Outreach (Addendum)
Care Coordination  09/13/2023  BROWNIE NEHME [birth year] [id number]   Successful telephone outreach today with Mr. Metzgar to establish case management. Mr. Reither had difficulty talking today. He told RNCM that he "lost his voice" but was willing to attempt this phone appointment. Shortly into the visit Mr. Welch request to reschedule this telephone visit. RNCM rescheduled to 09/18/23 at 1pm. Patient agreed to new date and time. RNCM advised patient to keep his scheduled visit with Dr. Gala Romney on 09/16/23 and following up with PCP.  Estanislado Emms RN, BSN [place]  [name] RN Care Manager [phone]

## 2023-09-14 DIAGNOSIS — Z419 Encounter for procedure for purposes other than remedying health state, unspecified: Secondary | ICD-10-CM | POA: Diagnosis not present

## 2023-09-16 ENCOUNTER — Inpatient Hospital Stay (HOSPITAL_COMMUNITY)
Admission: EM | Admit: 2023-09-16 | Discharge: 2023-09-20 | DRG: 291 | Disposition: A | Discharge status: Home / Self Care | Payer: 59 | Source: Other Acute Inpatient Hospital | Attending: Cardiology | Admitting: Cardiology

## 2023-09-16 ENCOUNTER — Telehealth (HOSPITAL_COMMUNITY): Payer: Self-pay

## 2023-09-16 ENCOUNTER — Encounter (HOSPITAL_COMMUNITY): Payer: Self-pay | Admitting: Internal Medicine

## 2023-09-16 ENCOUNTER — Other Ambulatory Visit: Payer: Self-pay

## 2023-09-16 ENCOUNTER — Encounter (HOSPITAL_COMMUNITY): Payer: Self-pay

## 2023-09-16 ENCOUNTER — Ambulatory Visit (HOSPITAL_BASED_OUTPATIENT_CLINIC_OR_DEPARTMENT_OTHER)
Admission: RE | Admit: 2023-09-16 | Discharge: 2023-09-16 | Disposition: A | Discharge status: Home / Self Care | Payer: 59 | Source: Ambulatory Visit | Attending: Internal Medicine | Admitting: Internal Medicine

## 2023-09-16 VITALS — BP 122/86 | HR 112 | Ht 71.0 in | Wt 300.0 lb

## 2023-09-16 DIAGNOSIS — E11621 Type 2 diabetes mellitus with foot ulcer: Secondary | ICD-10-CM | POA: Insufficient documentation

## 2023-09-16 DIAGNOSIS — I428 Other cardiomyopathies: Secondary | ICD-10-CM | POA: Diagnosis present

## 2023-09-16 DIAGNOSIS — Z560 Unemployment, unspecified: Secondary | ICD-10-CM | POA: Insufficient documentation

## 2023-09-16 DIAGNOSIS — E1165 Type 2 diabetes mellitus with hyperglycemia: Secondary | ICD-10-CM | POA: Diagnosis present

## 2023-09-16 DIAGNOSIS — Z89412 Acquired absence of left great toe: Secondary | ICD-10-CM

## 2023-09-16 DIAGNOSIS — Z5941 Food insecurity: Secondary | ICD-10-CM

## 2023-09-16 DIAGNOSIS — L97529 Non-pressure chronic ulcer of other part of left foot with unspecified severity: Secondary | ICD-10-CM | POA: Insufficient documentation

## 2023-09-16 DIAGNOSIS — I110 Hypertensive heart disease with heart failure: Principal | ICD-10-CM | POA: Diagnosis present

## 2023-09-16 DIAGNOSIS — L89152 Pressure ulcer of sacral region, stage 2: Secondary | ICD-10-CM | POA: Diagnosis present

## 2023-09-16 DIAGNOSIS — L97519 Non-pressure chronic ulcer of other part of right foot with unspecified severity: Secondary | ICD-10-CM | POA: Insufficient documentation

## 2023-09-16 DIAGNOSIS — I5084 End stage heart failure: Secondary | ICD-10-CM | POA: Diagnosis present

## 2023-09-16 DIAGNOSIS — E1169 Type 2 diabetes mellitus with other specified complication: Secondary | ICD-10-CM | POA: Diagnosis present

## 2023-09-16 DIAGNOSIS — E66813 Obesity, class 3: Secondary | ICD-10-CM | POA: Diagnosis present

## 2023-09-16 DIAGNOSIS — M869 Osteomyelitis, unspecified: Secondary | ICD-10-CM | POA: Diagnosis present

## 2023-09-16 DIAGNOSIS — I5082 Biventricular heart failure: Secondary | ICD-10-CM | POA: Diagnosis present

## 2023-09-16 DIAGNOSIS — Z5986 Financial insecurity: Secondary | ICD-10-CM | POA: Diagnosis not present

## 2023-09-16 DIAGNOSIS — Z5919 Other inadequate housing: Secondary | ICD-10-CM | POA: Insufficient documentation

## 2023-09-16 DIAGNOSIS — I5023 Acute on chronic systolic (congestive) heart failure: Secondary | ICD-10-CM | POA: Insufficient documentation

## 2023-09-16 DIAGNOSIS — R000 Tachycardia, unspecified: Secondary | ICD-10-CM | POA: Diagnosis present

## 2023-09-16 DIAGNOSIS — Z608 Other problems related to social environment: Secondary | ICD-10-CM | POA: Diagnosis present

## 2023-09-16 DIAGNOSIS — Z8739 Personal history of other diseases of the musculoskeletal system and connective tissue: Secondary | ICD-10-CM | POA: Insufficient documentation

## 2023-09-16 DIAGNOSIS — Z6839 Body mass index (BMI) 39.0-39.9, adult: Secondary | ICD-10-CM

## 2023-09-16 DIAGNOSIS — L0390 Cellulitis, unspecified: Secondary | ICD-10-CM | POA: Diagnosis present

## 2023-09-16 DIAGNOSIS — Z79899 Other long term (current) drug therapy: Secondary | ICD-10-CM | POA: Diagnosis not present

## 2023-09-16 DIAGNOSIS — Z833 Family history of diabetes mellitus: Secondary | ICD-10-CM

## 2023-09-16 DIAGNOSIS — Z8249 Family history of ischemic heart disease and other diseases of the circulatory system: Secondary | ICD-10-CM | POA: Diagnosis not present

## 2023-09-16 DIAGNOSIS — Z91199 Patient's noncompliance with other medical treatment and regimen due to unspecified reason: Secondary | ICD-10-CM | POA: Diagnosis not present

## 2023-09-16 DIAGNOSIS — M86279 Subacute osteomyelitis, unspecified ankle and foot: Secondary | ICD-10-CM

## 2023-09-16 LAB — CBC WITH DIFFERENTIAL/PLATELET
Abs Immature Granulocytes: 0.04 10*3/uL (ref 0.00–0.07)
Basophils Absolute: 0.1 10*3/uL (ref 0.0–0.1)
Basophils Relative: 2 %
Eosinophils Absolute: 0.1 10*3/uL (ref 0.0–0.5)
Eosinophils Relative: 2 %
HCT: 43.5 % (ref 39.0–52.0)
Hemoglobin: 13.4 g/dL (ref 13.0–17.0)
Immature Granulocytes: 1 %
Lymphocytes Relative: 25 %
Lymphs Abs: 1.6 10*3/uL (ref 0.7–4.0)
MCH: 27.7 pg (ref 26.0–34.0)
MCHC: 30.8 g/dL (ref 30.0–36.0)
MCV: 89.9 fL (ref 80.0–100.0)
Monocytes Absolute: 0.7 10*3/uL (ref 0.1–1.0)
Monocytes Relative: 11 %
Neutro Abs: 3.7 10*3/uL (ref 1.7–7.7)
Neutrophils Relative %: 59 %
Platelets: 239 10*3/uL (ref 150–400)
RBC: 4.84 MIL/uL (ref 4.22–5.81)
RDW: 22.9 % — ABNORMAL HIGH (ref 11.5–15.5)
WBC: 6.1 10*3/uL (ref 4.0–10.5)
nRBC: 0.3 % — ABNORMAL HIGH (ref 0.0–0.2)

## 2023-09-16 LAB — COMPREHENSIVE METABOLIC PANEL
ALT: 16 U/L (ref 0–44)
AST: 35 U/L (ref 15–41)
Albumin: 2.3 g/dL — ABNORMAL LOW (ref 3.5–5.0)
Alkaline Phosphatase: 151 U/L — ABNORMAL HIGH (ref 38–126)
Anion gap: 11 (ref 5–15)
BUN: 32 mg/dL — ABNORMAL HIGH (ref 6–20)
CO2: 26 mmol/L (ref 22–32)
Calcium: 8.4 mg/dL — ABNORMAL LOW (ref 8.9–10.3)
Chloride: 98 mmol/L (ref 98–111)
Creatinine, Ser: 1.39 mg/dL — ABNORMAL HIGH (ref 0.61–1.24)
GFR, Estimated: >60 mL/min (ref >60)
Glucose, Bld: 135 mg/dL — ABNORMAL HIGH (ref 70–99)
Potassium: 4.3 mmol/L (ref 3.5–5.1)
Sodium: 135 mmol/L (ref 135–145)
Total Bilirubin: 1.3 mg/dL — ABNORMAL HIGH (ref 0.0–1.2)
Total Protein: 8.1 g/dL (ref 6.5–8.1)

## 2023-09-16 LAB — GLUCOSE, CAPILLARY
Glucose-Capillary: 142 mg/dL — ABNORMAL HIGH (ref 70–99)
Glucose-Capillary: 131 mg/dL — ABNORMAL HIGH (ref 70–99)

## 2023-09-16 MED ORDER — FUROSEMIDE 10 MG/ML IJ SOLN
80.0000 mg | Freq: Two times a day (BID) | INTRAMUSCULAR | Status: DC
  Administered 2023-09-16 – 2023-09-17 (×2): 80 mg via INTRAVENOUS
  Filled 2023-09-16 (×2): qty 8

## 2023-09-16 MED ORDER — SODIUM CHLORIDE 0.9 % IV SOLN: 250.0000 mL | INTRAVENOUS | Status: AC | PRN

## 2023-09-16 MED ORDER — INSULIN ASPART 100 UNIT/ML IJ SOLN
0.0000 [IU] | INTRAMUSCULAR | Status: DC
  Administered 2023-09-16 – 2023-09-17 (×3): 3 [IU] via SUBCUTANEOUS

## 2023-09-16 MED ORDER — SPIRONOLACTONE 25 MG PO TABS
25.0000 mg | ORAL_TABLET | Freq: Every day | ORAL | Status: DC
  Administered 2023-09-16 – 2023-09-20 (×5): 25 mg via ORAL
  Filled 2023-09-16 (×5): qty 1

## 2023-09-16 MED ORDER — ACETAMINOPHEN 325 MG PO TABS
650.0000 mg | ORAL_TABLET | ORAL | Status: DC | PRN
Start: 2023-09-16 — End: 2023-09-20

## 2023-09-16 MED ORDER — SODIUM CHLORIDE 0.9% FLUSH: 3.0000 mL | INTRAVENOUS | Status: DC | PRN

## 2023-09-16 MED ORDER — SODIUM CHLORIDE 0.9% FLUSH
3.0000 mL | Freq: Two times a day (BID) | INTRAVENOUS | Status: DC
  Administered 2023-09-16 – 2023-09-19 (×6): 3 mL via INTRAVENOUS

## 2023-09-16 MED ORDER — DIGOXIN 125 MCG PO TABS
0.1250 mg | ORAL_TABLET | Freq: Every day | ORAL | Status: DC
  Administered 2023-09-16 – 2023-09-20 (×5): 0.125 mg via ORAL
  Filled 2023-09-16 (×5): qty 1

## 2023-09-16 MED ORDER — ONDANSETRON HCL 4 MG/2ML IJ SOLN: 4.0000 mg | Freq: Four times a day (QID) | INTRAMUSCULAR | Status: DC | PRN

## 2023-09-16 MED ORDER — POTASSIUM CHLORIDE CRYS ER 20 MEQ PO TBCR
40.0000 meq | EXTENDED_RELEASE_TABLET | Freq: Two times a day (BID) | ORAL | Status: DC
  Administered 2023-09-16 – 2023-09-17 (×2): 40 meq via ORAL
  Filled 2023-09-16 (×2): qty 2

## 2023-09-16 MED ORDER — SACUBITRIL-VALSARTAN 24-26 MG PO TABS
1.0000 | ORAL_TABLET | Freq: Two times a day (BID) | ORAL | Status: DC
  Administered 2023-09-16 – 2023-09-20 (×8): 1 via ORAL
  Filled 2023-09-16 (×8): qty 1

## 2023-09-16 MED ORDER — ORAL CARE MOUTH RINSE: 15.0000 mL | OROMUCOSAL | Status: DC | PRN

## 2023-09-16 MED ORDER — HEPARIN SODIUM (PORCINE) 5000 UNIT/ML IJ SOLN
5000.0000 [IU] | Freq: Three times a day (TID) | INTRAMUSCULAR | Status: DC
  Administered 2023-09-16 – 2023-09-20 (×12): 5000 [IU] via SUBCUTANEOUS
  Filled 2023-09-16 (×12): qty 1

## 2023-09-16 NOTE — Progress Notes (Signed)
ADVANCED HF CLINIC NOTE   Primary Care: [facility] Dept. HF Cardiologist: Dr. Gala Romney  HPI: Dakota Ramirez is a 53 y.o. 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    Admitted 4/23 with new-onset acute HF.  Given IV lasix. Echo EF 20-25%, regional WMA, moderately dilated LV, RV moderately reduced, RVSP 40 mmHg, mild to moderate MR, dilated IVC with estimated RAP 15 mmHg. Cards consulted and GDMT started. 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  Overall diuresed 50 lbs. No SGLT2i with uncontrolled DM. Discharged home, weight 259 lbs.   cMRI 4/24 showed severe biventricular failure, LVEF 17%. RVEF 22% LGE imaging very poor quality, which limits interpretation. 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   Follow up 12/26/22, NYHA IIIb, markedly volume overload weight up 40 lbs, ReDs 42%. Advised to go to ED for evaluation, where he was admitted. Diuresed with IV lasix. Echo showed EF <20%, LV with GHK, RV mildly reduced, LA/RA sev dilated, mild-mod MR, mild TR, trivial pericardial effusion. GDMT titrated. Dr Lajoyce Corners consulted for chronic osteomyelitis and Unna boot placed on RLE, no indication for surgical intervention.  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  Admitted 06/25/23 with mixed cardiogenic and septic shock secondary to LE cellulitis/wounds w/ ? osteomyelitis. He required pressor support with norepinephrine. UDS + for amphetamines. Advanced heart failure consulted. Diuresed with IV lasix and GDMT slowly added back. Echo with EF 20-25%, RV mildly reduced, new mobile density on posterior mitral valve leaflet. Subsequently had TEE which did not show any evidence of vegetation. ID consulted. 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 Additional imaging to  foot wounds also suggested. He refused to stay for additional antibiotics and treatment. Not a candidate for home antibiotics and PICC line d/t substance abuse.   Post hospital follow up 11/24, NYHA IIIb and volume overloaded. Concern for CGS. Diuretics increased with close follow up. He was unwilling to be re-admitted.  Admitted for HF/shock and massive overload from 12/17 - 08/04/23. Diuresed 60 pounds. D/c weight 253 pounds. Also treated for osteomyelitis Linezolid 600 BID x 6 weeks Cefadroxil 500 BID x 6 weeks  I saw him last week and was nearly 30 pounds up with fluid. Refused admission. Given metolazone throughout the weekend. Returns today for f/u. Weight up another 2 pounds. Remains SOB and edematous.     Cardiac Studies: - TEE (11/24): no evidence of vegetation.  - Echo (11/24): EF 20-25%, RV mildly reduced, new mobile density on posterior mitral valve leaflet.  - Echo (5/24): EF < 20%, LV with GHK, RV mildly reduced, LA/RA sev dilated, mild-mod MR, mild TR, trivial pericardial effusion.   - Bedside echo (9/23): EF 20-25%  - Echo (4/23): EF 20-25%, regional WMA, moderately dilated LV, RV moderately reduced, RVSP 40 mmHg, mild to moderate MR, dilated IVC with estimated RAP 15 mmHg.  - R/LHC (4/23):    The left ventricular ejection fraction is less than 25% by visual estimate.  Ao = 97/67 (85) LV = 105/25 RA = 8 RV = 37/14 PA = 35/22 (25) PCW = 20 Fick cardiac output/index = 5.9/2.4 PVR = 0.8 WU SVR = 1040 Ao sat = 97% PA sat = 66%, 67%   1. Normal coronary arteries 2. 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 Mildly elevated filling pressures with normal cardiac  output  cMRI (4/24): poor quality, LVEF 17%, RVEF 22%,  appears to have RV insertion site LGE (which is a nonspecific scar finding often seen in setting of elevated pulmonary pressures) and basal septal midwall LGE (which is a scar pattern seen in NICM and associated with worse prognosis)   Past Medical History:  Diagnosis  Date   CHF (congestive heart failure) (HCC)    Diabetes mellitus without complication (HCC)    Type II   Heart failure with reduced ejection fraction (HCC) 11/26/2021   Hypertension    Sepsis with encephalopathy and septic shock (HCC) 06/26/2023   Shock (HCC) 06/26/2023   Current Outpatient Medications  Medication Sig Dispense Refill   bumetanide (BUMEX) 2 MG tablet Take 2 tablets (4 mg total) by mouth 2 (two) times daily. 120 tablet 3   digoxin (LANOXIN) 0.125 MG tablet Take 1 tablet (0.125 mg total) by mouth daily. 90 tablet 3   metolazone (ZAROXOLYN) 5 MG tablet Take 1 tablet (5 mg total) by mouth daily. Take an extra 2 potassium tablets with your Metolazone 5 tablet 0   NOVOLOG MIX 70/30 FLEXPEN (70-30) 100 UNIT/ML FlexPen Inject 30 Units into the skin 2 (two) times daily with a meal. 15 mL 3   potassium chloride SA (KLOR-CON M) 20 MEQ tablet Take 2 tablets (40 mEq total) by mouth daily. Take extra 40 meq on day you take metolazone. Take extra 40 mg on days you use Furoxcix. 120 tablet 5   sacubitril-valsartan (ENTRESTO) 24-26 MG Take 1 tablet by mouth 2 (two) times daily. 180 tablet 3   spironolactone (ALDACTONE) 25 MG tablet Take 1 tablet (25 mg total) by mouth daily. 90 tablet 3   No current facility-administered medications for this encounter.   No Known Allergies  Social History   Socioeconomic History   Marital status: Single    Spouse name: Not on file   Number of children: Not on file   Years of education: Not on file   Highest education level: Not on file  Occupational History   Not on file  Tobacco Use   Smoking status: Never   Smokeless tobacco: Never  Vaping Use   Vaping status: Never Used  Substance and Sexual Activity   Alcohol use: Not Currently    Comment: rare   Drug use: Never   Sexual activity: Not on file  Other Topics Concern   Not on file  Social History Narrative   Not on file   Social Drivers of Health   Financial Resource Strain: High  Risk (01/10/2023)   Overall Financial Resource Strain (CARDIA)    Difficulty of Paying Living Expenses: Hard  Food Insecurity: Food Insecurity Present (12/28/2022)   Hunger Vital Sign    Worried About Running Out of Food in the Last Year: Often true    Ran Out of Food in the Last Year: Often true  Transportation Needs: No Transportation Needs (09/13/2023)   PRAPARE - Administrator, Civil Service (Medical): No    Lack of Transportation (Non-Medical): No  Physical Activity: Not on file  Stress: No Stress Concern Present (02/28/2021)   Received from [name]ational [facility] - Occupational Stress Questionnaire    Feeling of Stress : Not at all  Social Connections: Unknown (12/15/2021)   Received from [facility], [facility]   Social Network    Social Network: Not on file  Intimate Partner Violence: Unknown (11/14/2021)   [facility]   HITS    Physically Hurt: Not on file    Insult or Talk Down To: Not on file    Threaten Physical Harm: Not on file    Scream or Curse: Not on file   Family History  Problem Relation Age of Onset   Diabetes Mother    Hypertension Mother    BP 122/86   Pulse (!) 112   Ht 5\' 11"  (1.803 m)   Wt 136.1 kg (300 lb)   SpO2 95%   BMI 41.84 kg/m   Wt Readings from Last 3 Encounters:  09/16/23 136.1 kg (300 lb)  09/12/23 135.5 kg (298 lb 12.8 oz)  08/19/23 114.5 kg (252 lb 6.4 oz)   PHYSICAL EXAM: General:  Chronically-ill appearing. No resp difficulty HEENT: normal Neck: supple. JVP to jaw Carotids 2+ bilat; no bruits. No lymphadenopathy or thryomegaly appreciated. Cor. Regular tachy 2/6 TR Lungs: clear Abdomen: soft, nontender, + distended. No hepatosplenomegaly. No bruits or masses. Good bowel sounds. Extremities: no cyanosis, clubbing, rash, 3-4+ edema into thighs Neuro: alert & orientedx3, cranial nerves grossly intact. moves all 4 extremities w/o  difficulty. Affect pleasant   Assessment/Plan:   Acute on Chronic Systolic Heart Failure - NICM. Reports hx CHF in his mother. No hx alcohol or drug abuse. 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 RVEF 22% LGE imaging very poor quality, which limits interpretation. Appears to have RV insertion site LGE (associated with worse prognosis) - Echo (5/24): EF < 20%, LV with GHK, RV mildly reduced, LA/RA sev dilated, mild-mod MR, mild TR, trivial pericardial effusion.  - Echo 11/24: EF 20-25%, RV mildly reduced, mild MR - Recent admit 12/24 with 60 pound diuresis - He is end-stage now with recurrent massive volume overload and he has again failed outpatient management with high-dose oral diuretics - I suspect non-compliance is contributing - Will admit for IV diuresis - Not a candidate for advanced therapies given chronic wounds and social situation - I offered Palliative services to him but he is not interested in having people come to his house   2. Chronic foot wounds - Treated for cellulitis/osteomyelitis recent admit - Now on home abx - foot wounds are wrapped and improved   3. Uncontrolled DM2 - Most recent A1C 10.3 - On insulin - will cover with SSI as inpatient   4. Hx osteomyelitis s/p right TMA - 2/2 poorly controlled DM - abx complete - reassess as inpatient   4. Recent AKI - Scr peaked at 4.8 during recent admit in setting of shock - Check labs today and follow with diuresis   7. SDOH  - Now has medicaid - Lives in a camper behind father's house. Unemployed and has limited social support. - Declined Paramedicine and Palliative support. I asked him again today   Arvilla Meres, MD   2:41 PM

## 2023-09-16 NOTE — H&P (Signed)
ADVANCED HF CLINIC H&P     Primary Care: [facility] Dept. HF Cardiologist: Dr. Gala Romney   HPI: Dakota Ramirez is a 53 y.o. 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    Admitted 4/23 with new-onset acute HF.  Given IV lasix. Echo EF 20-25%, regional WMA, moderately dilated LV, RV moderately reduced, RVSP 40 mmHg, mild to moderate MR, dilated IVC with estimated RAP 15 mmHg. Cards consulted and GDMT started. 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  Overall diuresed 50 lbs. No SGLT2i with uncontrolled DM. Discharged home, weight 259 lbs.    cMRI 4/24 showed severe biventricular failure, LVEF 17%. RVEF 22% LGE imaging very poor quality, which limits interpretation. 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    Follow up 12/26/22, NYHA IIIb, markedly volume overload weight up 40 lbs, ReDs 42%. Advised to go to ED for evaluation, where he was admitted. Diuresed with IV lasix. Echo showed EF <20%, LV with GHK, RV mildly reduced, LA/RA sev dilated, mild-mod MR, mild TR, trivial pericardial effusion. GDMT titrated. Dr Lajoyce Corners consulted for chronic osteomyelitis and Unna boot placed on RLE, no indication for surgical intervention.   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   Admitted 06/25/23 with mixed cardiogenic and septic shock secondary to LE cellulitis/wounds w/ ? osteomyelitis. He required pressor support with norepinephrine. UDS + for amphetamines. Advanced heart failure consulted. Diuresed with IV lasix and GDMT slowly added back. Echo with EF 20-25%, RV mildly reduced, new mobile density on posterior mitral valve leaflet. Subsequently had TEE which did not show any evidence of vegetation. ID consulted. 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 Additional imaging  to foot wounds also suggested. He refused to stay for additional antibiotics and treatment. Not a candidate for home antibiotics and PICC line d/t substance abuse.   Post hospital follow up 11/24, NYHA IIIb and volume overloaded. Concern for CGS. Diuretics increased with close follow up. He was unwilling to be re-admitted.   Admitted for HF/shock and massive overload from 12/17 - 08/04/23. Diuresed 60 pounds. D/c weight 253 pounds. Also treated for osteomyelitis Linezolid 600 BID x 6 weeks Cefadroxil 500 BID x 6 weeks   I saw him last week and was nearly 30 pounds up with fluid. Refused admission. Given metolazone throughout the weekend. Returns today for f/u. Weight up another 2 pounds. Remains SOB and edematous.      Cardiac Studies: - TEE (11/24): no evidence of vegetation.   - Echo (11/24): EF 20-25%, RV mildly reduced, new mobile density on posterior mitral valve leaflet.   - Echo (5/24): EF < 20%, LV with GHK, RV mildly reduced, LA/RA sev dilated, mild-mod MR, mild TR, trivial pericardial effusion.    - Bedside echo (9/23): EF 20-25%   - Echo (4/23): EF 20-25%, regional WMA, moderately dilated LV, RV moderately reduced, RVSP 40 mmHg, mild to moderate MR, dilated IVC with estimated RAP 15 mmHg.   - R/LHC (4/23):    The left ventricular ejection fraction is less than 25% by visual estimate.   Ao = 97/67 (85) LV = 105/25 RA = 8 RV = 37/14 PA = 35/22 (25) PCW = 20 Fick cardiac output/index = 5.9/2.4 PVR = 0.8 WU SVR = 1040 Ao sat = 97% PA sat = 66%, 67%  1. Normal coronary arteries 2. 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 Mildly elevated filling pressures with normal cardiac output   cMRI (4/24): poor quality, LVEF 17%, RVEF 22%,  appears to have RV insertion site LGE (which is a nonspecific scar finding often seen in setting of elevated pulmonary pressures) and basal septal midwall LGE (which is a scar pattern seen in NICM and associated with worse prognosis)         Past Medical  History:  Diagnosis Date   CHF (congestive heart failure) (HCC)     Diabetes mellitus without complication (HCC)      Type II   Heart failure with reduced ejection fraction (HCC) 11/26/2021   Hypertension     Sepsis with encephalopathy and septic shock (HCC) 06/26/2023   Shock (HCC) 06/26/2023              Current Outpatient Medications  Medication Sig Dispense Refill   bumetanide (BUMEX) 2 MG tablet Take 2 tablets (4 mg total) by mouth 2 (two) times daily. 120 tablet 3   digoxin (LANOXIN) 0.125 MG tablet Take 1 tablet (0.125 mg total) by mouth daily. 90 tablet 3   metolazone (ZAROXOLYN) 5 MG tablet Take 1 tablet (5 mg total) by mouth daily. Take an extra 2 potassium tablets with your Metolazone 5 tablet 0   NOVOLOG MIX 70/30 FLEXPEN (70-30) 100 UNIT/ML FlexPen Inject 30 Units into the skin 2 (two) times daily with a meal. 15 mL 3   potassium chloride SA (KLOR-CON M) 20 MEQ tablet Take 2 tablets (40 mEq total) by mouth daily. Take extra 40 meq on day you take metolazone. Take extra 40 mg on days you use Furoxcix. 120 tablet 5   sacubitril-valsartan (ENTRESTO) 24-26 MG Take 1 tablet by mouth 2 (two) times daily. 180 tablet 3   spironolactone (ALDACTONE) 25 MG tablet Take 1 tablet (25 mg total) by mouth daily. 90 tablet 3      No current facility-administered medications for this encounter.      Allergies  No Known Allergies     Social History         Socioeconomic History   Marital status: Single      Spouse name: Not on file   Number of children: Not on file   Years of education: Not on file   Highest education level: Not on file  Occupational History   Not on file  Tobacco Use   Smoking status: Never   Smokeless tobacco: Never  Vaping Use   Vaping status: Never Used  Substance and Sexual Activity   Alcohol use: Not Currently      Comment: rare   Drug use: Never   Sexual activity: Not on file  Other Topics Concern   Not on file  Social History Narrative   Not on  file    Social Drivers of Health        Financial Resource Strain: High Risk (01/10/2023)    Overall Financial Resource Strain (CARDIA)     Difficulty of Paying Living Expenses: Hard  Food Insecurity: Food Insecurity Present (12/28/2022)    Hunger Vital Sign     Worried About Running Out of Food in the Last Year: Often true     Ran Out of Food in the Last Year: Often true  Transportation Needs: No Transportation Needs (09/13/2023)    PRAPARE - Therapist, art (Medical): No     Lack of Transportation (Non-Medical): No  Physical Activity:  Not on file  Stress: No Stress Concern Present (02/28/2021)    Received from [facility], [facility] of Occupational Health - Occupational Stress Questionnaire     Feeling of Stress : Not at all  Social Connections: Unknown (12/15/2021)    Received from [name], [facility]    Social Network     Social Network: Not on file  Intimate Partner Violence: Unknown (11/14/2021)    Received from [facility], [facility]    HITS     Physically Hurt: Not on file     Insult or Talk Down To: Not on file     Threaten Physical Harm: Not on file     Scream or Curse: Not on file         Family History  Problem Relation Age of Onset   Diabetes Mother     Hypertension Mother          BP 122/86   Pulse (!) 112   Ht 5\' 11"  (1.803 m)   Wt 136.1 kg (300 lb)   SpO2 95%   BMI 41.84 kg/m       Wt Readings from Last 3 Encounters:  09/16/23 136.1 kg (300 lb)  09/12/23 135.5 kg (298 lb 12.8 oz)  08/19/23 114.5 kg (252 lb 6.4 oz)    PHYSICAL EXAM: General:  Chronically-ill appearing. No resp difficulty HEENT: normal Neck: supple. JVP to jaw Carotids 2+ bilat; no bruits. No lymphadenopathy or thryomegaly appreciated. Cor. Regular tachy 2/6 TR Lungs: clear Abdomen: soft, nontender, + distended. No hepatosplenomegaly. No bruits or masses. Good bowel sounds. Extremities: no cyanosis, clubbing, rash,  3-4+ edema into thighs Neuro: alert & orientedx3, cranial nerves grossly intact. moves all 4 extremities w/o difficulty. Affect pleasant     Assessment/Plan:    Acute on Chronic Systolic Heart Failure - NICM. Reports hx CHF in his mother. No hx alcohol or drug abuse. 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 RVEF 22% LGE imaging very poor quality, which limits interpretation. Appears to have RV insertion site LGE (associated with worse prognosis) - Echo (5/24): EF < 20%, LV with GHK, RV mildly reduced, LA/RA sev dilated, mild-mod MR, mild TR, trivial pericardial effusion.  - Echo 11/24: EF 20-25%, RV mildly reduced, mild MR - Recent admit 12/24 with 60 pound diuresis - He is end-stage now with recurrent massive volume overload and he has again failed outpatient management with high-dose oral diuretics - I suspect non-compliance is contributing - Will admit for IV diuresis - Not a candidate for advanced therapies given chronic wounds and social situation - I offered Palliative services to him but he is not interested in having people come to his house   2. Chronic foot wounds - Treated for cellulitis/osteomyelitis recent admit - Now on home abx - foot wounds are wrapped and improved   3. Uncontrolled DM2 - Most recent A1C 10.3 - On insulin - will cover with SSI as inpatient   4. Hx osteomyelitis s/p right TMA - 2/2 poorly controlled DM - abx complete - reassess as inpatient   4. Recent AKI - Scr peaked at 4.8 during recent admit in setting of shock - Check labs today and  follow with diuresis   7. SDOH  - Now has medicaid - Lives in a camper behind father's house. Unemployed and has limited  social support. - Declined Paramedicine and Palliative support. I asked him again today     Arvilla Meres, MD  2:41 PM

## 2023-09-16 NOTE — Progress Notes (Signed)
Verified code status on admission. Wants to be a full code. Discussed that there would be very minimal likelihood of meaningful recovery if he had cardiac arrest requiring CPR. He states that he would want a chance at resuscitation.

## 2023-09-16 NOTE — Telephone Encounter (Signed)
Per Dr. Gala Romney- patient needs to be direct admitted to HF service today.   Called and spoke with patient placement- they will have a bed for patient today on [facility]- per patient placement- patient to be escorted to room [facility] 12 when room ready    Room is ready per patient placemed- call placed to [facility] report given to bedside nurse.  Patient escorted to [facility] room 12 care taken over by [facility] staff.

## 2023-09-16 NOTE — Progress Notes (Signed)
MEWS Progress Note  Patient Details Name: Dakota Ramirez MRN: [id number] DOB: [birth year] Today's Date: 09/16/2023   MEWS Flowsheet Documentation:  Assess: MEWS Score Temp: 97.7 F (36.5 C) BP: 94/82 MAP (mmHg): 87 Pulse Rate: (!) 114 ECG Heart Rate: (!) 114 Resp: 18 Level of Consciousness: Alert SpO2: 96 % O2 Device: Room Air Assess: MEWS Score MEWS Temp: 0 MEWS Systolic: 1 MEWS Pulse: 2 MEWS RR: 0 MEWS LOC: 0 MEWS Score: 3 MEWS Score Color: Yellow Assess: SIRS CRITERIA SIRS Temperature : 0 SIRS Respirations : 0 SIRS Pulse: 1 SIRS WBC: 0 SIRS Score Sum : 1 SIRS Temperature : 0 SIRS Pulse: 1 SIRS Respirations : 0 SIRS WBC: 0 SIRS Score Sum : 1 Assess: if the MEWS score is Yellow or Red Were vital signs accurate and taken at a resting state?: Yes Does the patient meet 2 or more of the SIRS criteria?: No MEWS guidelines implemented : Yes, yellow Treat MEWS Interventions: Considered administering scheduled or prn medications/treatments as ordered Take Vital Signs Increase Vital Sign Frequency : Yellow: Q2hr x1, continue Q4hrs until patient remains green for 12hrs Escalate MEWS: Escalate: Yellow: Discuss with charge nurse and consider notifying provider and/or RRT        Myrtha Mantis 09/16/2023, 6:56 PM

## 2023-09-16 NOTE — Plan of Care (Signed)

## 2023-09-16 NOTE — Patient Instructions (Signed)
Admitting here at [facility] is going to call regarding your admission to the hospital    Follow up to be determined post hospital stay.

## 2023-09-17 ENCOUNTER — Other Ambulatory Visit (HOSPITAL_COMMUNITY): Payer: Self-pay

## 2023-09-17 ENCOUNTER — Telehealth (HOSPITAL_COMMUNITY): Payer: Self-pay | Admitting: Pharmacy Technician

## 2023-09-17 DIAGNOSIS — I5023 Acute on chronic systolic (congestive) heart failure: Secondary | ICD-10-CM | POA: Diagnosis not present

## 2023-09-17 LAB — BASIC METABOLIC PANEL
Anion gap: 11 (ref 5–15)
Anion gap: 10 (ref 5–15)
BUN: 31 mg/dL — ABNORMAL HIGH (ref 6–20)
BUN: 32 mg/dL — ABNORMAL HIGH (ref 6–20)
CO2: 26 mmol/L (ref 22–32)
CO2: 30 mmol/L (ref 22–32)
Calcium: 8.5 mg/dL — ABNORMAL LOW (ref 8.9–10.3)
Calcium: 8.9 mg/dL (ref 8.9–10.3)
Chloride: 97 mmol/L — ABNORMAL LOW (ref 98–111)
Chloride: 95 mmol/L — ABNORMAL LOW (ref 98–111)
Creatinine, Ser: 1.34 mg/dL — ABNORMAL HIGH (ref 0.61–1.24)
Creatinine, Ser: 1.46 mg/dL — ABNORMAL HIGH (ref 0.61–1.24)
GFR, Estimated: >60 mL/min (ref >60)
GFR, Estimated: 58 mL/min — ABNORMAL LOW (ref >60)
Glucose, Bld: 110 mg/dL — ABNORMAL HIGH (ref 70–99)
Glucose, Bld: 122 mg/dL — ABNORMAL HIGH (ref 70–99)
Potassium: 4.3 mmol/L (ref 3.5–5.1)
Potassium: 4.6 mmol/L (ref 3.5–5.1)
Sodium: 134 mmol/L — ABNORMAL LOW (ref 135–145)
Sodium: 135 mmol/L (ref 135–145)

## 2023-09-17 LAB — GLUCOSE, CAPILLARY
Glucose-Capillary: 117 mg/dL — ABNORMAL HIGH (ref 70–99)
Glucose-Capillary: 117 mg/dL — ABNORMAL HIGH (ref 70–99)
Glucose-Capillary: 119 mg/dL — ABNORMAL HIGH (ref 70–99)
Glucose-Capillary: 121 mg/dL — ABNORMAL HIGH (ref 70–99)
Glucose-Capillary: 155 mg/dL — ABNORMAL HIGH (ref 70–99)
Glucose-Capillary: 96 mg/dL (ref 70–99)

## 2023-09-17 LAB — MRSA NEXT GEN BY PCR, NASAL: MRSA by PCR Next Gen: NOT DETECTED

## 2023-09-17 LAB — MAGNESIUM: Magnesium: 1.5 mg/dL — ABNORMAL LOW (ref 1.7–2.4)

## 2023-09-17 MED ORDER — METOLAZONE 5 MG PO TABS
10.0000 mg | ORAL_TABLET | Freq: Two times a day (BID) | ORAL | Status: AC
  Administered 2023-09-17 (×2): 10 mg via ORAL
  Filled 2023-09-17 (×2): qty 2

## 2023-09-17 MED ORDER — POTASSIUM CHLORIDE CRYS ER 20 MEQ PO TBCR
20.0000 meq | EXTENDED_RELEASE_TABLET | Freq: Two times a day (BID) | ORAL | Status: AC
  Administered 2023-09-17: 20 meq via ORAL
  Filled 2023-09-17 (×2): qty 1

## 2023-09-17 MED ORDER — MAGNESIUM SULFATE 4 GM/100ML IV SOLN
4.0000 g | Freq: Once | INTRAVENOUS | Status: AC
  Administered 2023-09-17: 4 g via INTRAVENOUS
  Filled 2023-09-17: qty 100

## 2023-09-17 MED ORDER — FUROSEMIDE 10 MG/ML IJ SOLN
20.0000 mg/h | INTRAVENOUS | Status: DC
  Administered 2023-09-17 – 2023-09-20 (×8): 20 mg/h via INTRAVENOUS
  Filled 2023-09-17 (×8): qty 20

## 2023-09-17 NOTE — Discharge Summary (Incomplete)
Advanced Heart Failure Team  Discharge Summary   Patient ID: Dakota Ramirez MRN: [id number], DOB/AGE: 1971/06/29 53 y.o. Admit date: 09/16/2023 D/C date:     09/17/2023   Primary Discharge Diagnoses:  Acute on chronic biventricular systolic heart failure Nonischemic cardiomyopathy  Secondary Discharge Diagnoses:  Chronic foot wounds Uncontrolled DM II  Hospital Course:  Dakota Ramirez is a 53 y.o. 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    Admitted 4/23 with new-onset acute HF.  Echo EF 20-25%, RV moderately reduced, RVSP 40 mmHg, mild to moderate MR. R/LHC showed normal cors, mildly elevated filling pressures w/ normal CO.     cMRI 4/24: severe biventricular failure, LVEF 17%. RVEF 22% LGE imaging very poor quality. RV insertion site LGE (associated with worse prognosis).    Readmitted 05/24 with acute on chronic CHF.  Echo showed EF <20%, LV with GHK, RV mildly reduced, LA/RA sev dilated, mild-mod MR, mild TR, trivial pericardial effusion.    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   Admitted 06/25/23 with mixed cardiogenic and septic shock secondary to LE cellulitis w/ possible osteomyelitis requiring pressor support. UDS + for amphetamines. Advanced heart failure consulted. Echo with EF 20-25%, RV mildly reduced, new mobile density on posterior mitral valve leaflet. TEE did not show any evidence of vegetation. ID consulted. 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 He refused to stay for additional antibiotics and treatment.   Admitted for HF/shock and massive overload from 12/17 - 08/04/23. Diuresed 60 pounds. Echo with EF < 10%, severely dilated LV and RV, RV severely reduced, Also treated for osteomyelitis. Seen by ID. discharged on po abx Linezolid 600 BID x 6 weeks Cefadroxil 500 BID x 6 weeks.  He was a direct admission from the  clinic 09/16/23 with acute on chronic biventricular systolic CHF with massive volume overload. Noncompliance with medications suspected. He was diuresed with lasix gtt and BID metolazone.    Discharge Vitals: Blood pressure (!) 114/94, pulse (!) 112, temperature 97.8 F (36.6 C), temperature source Oral, resp. rate 20, height 5\' 11"  (1.803 m), weight 132.4 kg, SpO2 95%.  Labs: Lab Results  Component Value Date   WBC 6.1 09/16/2023   HGB 13.4 09/16/2023   HCT 43.5 09/16/2023   MCV 89.9 09/16/2023   PLT 239 09/16/2023    Recent Labs  Lab 09/16/23 1838 09/17/23 0507  NA 135 134*  K 4.3 4.3  CL 98 97*  CO2 26 26  BUN 32* 31*  CREATININE 1.39* 1.34*  CALCIUM 8.4* 8.5*  PROT 8.1  --   BILITOT 1.3*  --   ALKPHOS 151*  --   ALT 16  --   AST 35  --   GLUCOSE 135* 110*   Lab Results  Component Value Date   CHOL 93 11/28/2021   HDL 26 (L) 11/28/2021   LDLCALC 53 11/28/2021   TRIG 72 11/28/2021   BNP (last 3 results) Recent Labs    07/24/23 1451 07/30/23 0044 08/19/23 1536  BNP 1,670.4* 2,303.1* >4,500.0*    ProBNP (last 3 results) No results for input(s): "PROBNP" in the last 8760 hours.   Diagnostic Studies/Procedures   No results found.  Discharge Medications   Allergies as of 09/17/2023   No Known Allergies   Med Rec must be completed prior to using this [facility]***       Disposition   The patient  will be discharged in stable condition to home.      Duration of Discharge Encounter: Greater than 35 minutes   Signed, [facility], Angie Hogg N  09/17/2023, 3:44 PM

## 2023-09-17 NOTE — TOC Initial Note (Signed)
 Transition of Care [facility]) - Initial/Assessment Note    Patient Details  Name: Dakota Ramirez MRN: [id number] Date of Birth: Nov 13, 1970  [facility]) CM/SW Contact:    Arlana JINNY Nicholaus [name] Phone Number: [phone] 09/17/2023, 3:19 PM  Clinical Narrative:    HF CSW met with pt at bedside. Pt stated that he lives alone. Pt stated that he does not currently have any HH services. Pt stated that he uses a rollator and walking stick. Pt stated that he doe not use any equipment. Pt does not have a scale.Pt stated that he did not go to his follow up hospital follow up appoint during his last hospitalization.  CSW explained that another appt. Will be scheduled closer to dc and encouraged pt to go to appt.   Pt stated that he cannot afford meds. Pt stated that he has not worked in two years and the cost of meds contribute to noncompliance. CSW consulted with HF pharmacist to discuss more affordable cost for pt.   TOC will continue following.             Expected Discharge Plan: Home/Self Care     Patient Goals and CMS Choice Patient states their goals for this hospitalization and ongoing recovery are:: Return to home          Expected Discharge Plan and Services   Discharge Planning Services: CM Consult   Living arrangements for the past 2 months:  Counselling Psychologist on father's property.)                           HH Arranged: NA          Prior Living Arrangements/Services Living arrangements for the past 2 months:  Counselling Psychologist on father's property.) Lives with:: Self Patient language and need for interpreter reviewed:: Yes Do you feel safe going back to the place where you live?: Yes      Need for Family Participation in Patient Care: No (Comment) Care giver support system in place?: Yes (comment) Current home services: DME Criminal Activity/Legal Involvement Pertinent to Current Situation/Hospitalization: No - Comment as needed  Activities of Daily Living   ADL Screening  (condition at time of admission) Independently performs ADLs?: Yes (appropriate for developmental age) Is the patient deaf or have difficulty hearing?: No Does the patient have difficulty seeing, even when wearing glasses/contacts?: No Does the patient have difficulty concentrating, remembering, or making decisions?: No  Permission Sought/Granted Permission sought to share information with : Case Manager                Emotional Assessment Appearance:: Appears older than stated age Attitude/Demeanor/Rapport: Guarded Affect (typically observed): Guarded Orientation: : Oriented to Self, Oriented to Place, Oriented to  Time, Oriented to Situation Alcohol  / Substance Use: Other (comment) Psych Involvement: No (comment)  Admission diagnosis:  Acute on chronic systolic CHF (congestive heart failure) (HCC) [I50.23] Patient Active Problem List   Diagnosis Date Noted   Acute on chronic systolic CHF (congestive heart failure) (HCC) 09/16/2023   Acute on chronic heart failure (HCC) 07/30/2023   Non-ischemic cardiomyopathy (HCC) 07/01/2023   Congestive hepatopathy 06/30/2023   Bacteremia 06/30/2023   Mitral valve vegetation 06/30/2023   Hypoalbuminemia due to protein-calorie malnutrition (HCC) 06/30/2023   Non-adherence to medical treatment 06/30/2023   AKI (acute kidney injury) (HCC) 06/26/2023   Hyperkalemia 06/26/2023   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 01/10/2023   Diabetic osteomyelitis (  HCC) 12/28/2022   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 to breakdown of skin (HCC) 12/28/2022   Severe protein-calorie malnutrition (HCC) 12/28/2022   Acute on chronic systolic heart failure (HCC) 12/28/2022   Elevated alkaline phosphatase level 12/27/2022   Acute on chronic congestive heart failure (HCC) 12/26/2022   Hyponatremia 11/29/2021   PVD (peripheral vascular disease) (HCC) 11/29/2021   Class 2 obesity 11/29/2021   Heart failure with  reduced ejection fraction due to cardiomyopathy (HCC) 11/26/2021   Diabetic foot ulcer with osteomyelitis (HCC) 11/26/2021   Essential hypertension 11/26/2021   PCP:  Gregary Sharper, MD Pharmacy:   [facility] [address], [place] - [address] [place] [address] Phone: [phone] Fax: [phone]  [facility] [place], [place] - [address] [place] [address] Phone: [phone] Fax: [phone]  [place] - [facility] [address] [place] [address] Phone: [phone] Fax: [phone]  [place] - [facility] [address] [place] [address] Phone: [phone] Fax: [phone]     Social Drivers of Health (SDOH) Social History: SDOH Screenings   Food Insecurity: Food Insecurity Present (09/16/2023)  Housing: Low Risk  (09/16/2023)  Transportation Needs: No Transportation Needs (09/16/2023)  Utilities: Not At Risk (09/16/2023)  Depression (PHQ2-9): Low Risk  (01/17/2023)  Recent Concern: Depression (PHQ2-9) - Medium Risk (01/10/2023)  Financial Resource Strain: High Risk (01/10/2023)  Social Connections: Unknown (12/15/2021)   [facility] from [facility], [facility]  Stress: No Stress Concern Present (02/28/2021)   Received from [facility], [facility]  Tobacco Use: Low Risk  (09/16/2023)   SDOH Interventions:     Readmission Risk Interventions    09/17/2023    2:43 PM  Readmission Risk Prevention Plan  Transportation Screening Complete  PCP or Specialist Appt within 5-7 Days Complete  Home Care Screening Complete  Medication Review (RN CM) Complete

## 2023-09-17 NOTE — TOC Initial Note (Addendum)
 Transition of Care [facility]) - Initial/Assessment Note    Patient Details  Name: Dakota Ramirez MRN: [id number] Date of Birth: Sep 03, 1970  [facility]) CM/SW Contact:    Doneta Glenys DASEN, RN Phone Number: 09/17/2023, 1:59 PM  Clinical Narrative:                 Patient presented for heart failure and shock. PTA patient was from home. Living in a camper on his fathers property. Patients states that his father & brother Gwynn) are supportive.  Patient was independent and drives self to appointments. DME (walking stick & rollator). Case Manager discussed cost of medications benefits check and submitted for Entresto   (cost $4) and will follow up with patient. Patient received care at [facility] in [facility]. [facility] is not in patients insurance Administrator) network. Patient given information for other providers ([facility]) in [place] to schedule appointment. Case Manager will continue to follow the patient as he progresses.  Expected Discharge Plan: Home/Self Care     Patient Goals and CMS Choice Patient states their goals for this hospitalization and ongoing recovery are:: Return to home          Expected Discharge Plan and Services   Discharge Planning Services: CM Consult   Living arrangements for the past 2 months:  Counselling Psychologist on father's property.)                           HH Arranged: NA          Prior Living Arrangements/Services Living arrangements for the past 2 months:  Counselling Psychologist on father's property.) Lives with:: Self Patient language and need for interpreter reviewed:: Yes Do you feel safe going back to the place where you live?: Yes      Need for Family Participation in Patient Care: No (Comment) Care giver support system in place?: Yes (comment) Current home services: DME Criminal Activity/Legal Involvement Pertinent to Current Situation/Hospitalization: No - Comment as  needed  Activities of Daily Living   ADL Screening (condition at time of admission) Independently performs ADLs?: Yes (appropriate for developmental age) Is the patient deaf or have difficulty hearing?: No Does the patient have difficulty seeing, even when wearing glasses/contacts?: No Does the patient have difficulty concentrating, remembering, or making decisions?: No  Permission Sought/Granted Permission sought to share information with : Case Manager                Emotional Assessment Appearance:: Appears older than stated age Attitude/Demeanor/Rapport: Guarded Affect (typically observed): Guarded Orientation: : Oriented to Self, Oriented to Place, Oriented to  Time, Oriented to Situation Alcohol  / Substance Use: Other (comment) Psych Involvement: No (comment)  Admission diagnosis:  Acute on chronic systolic CHF (congestive heart failure) (HCC) [I50.23] Patient Active Problem List   Diagnosis Date Noted   Acute on chronic systolic CHF (congestive heart failure) (HCC) 09/16/2023   Acute on chronic heart failure (HCC) 07/30/2023   Non-ischemic cardiomyopathy (HCC) 07/01/2023   Congestive hepatopathy 06/30/2023   Bacteremia 06/30/2023   Mitral valve vegetation 06/30/2023   Hypoalbuminemia due to protein-calorie malnutrition (HCC) 06/30/2023   Non-adherence to medical treatment 06/30/2023   AKI (acute kidney injury) (HCC) 06/26/2023   Hyperkalemia 06/26/2023   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 01/10/2023   Diabetic osteomyelitis (HCC) 12/28/2022   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 to breakdown of  skin (HCC) 12/28/2022   Severe protein-calorie malnutrition (HCC) 12/28/2022   Acute on chronic systolic heart failure (HCC) 12/28/2022   Elevated alkaline phosphatase level 12/27/2022   Acute on chronic congestive heart failure (HCC) 12/26/2022   Hyponatremia 11/29/2021   PVD (peripheral vascular disease) (HCC)  11/29/2021   Class 2 obesity 11/29/2021   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) 11/26/2021   Diabetic foot ulcer with osteomyelitis (HCC) 11/26/2021   Essential hypertension 11/26/2021   PCP:  Gregary Sharper, MD Pharmacy:   [facility] [address] [place] - [address] [place] [address] Phone: [phone] Fax: [phone]  [facility] [place], [place] - [address] [place] [address] Phone: [phone] Fax: [phone]  [place] - [facility] [address] [place] [address] Phone: [phone] Fax: [phone]  [place] - [facility] [address] [place] [address] Phone: [phone] Fax: [phone]     Social Drivers of Health (SDOH) Social History: SDOH Screenings   Food Insecurity: Food Insecurity Present (09/16/2023)  Housing: Low Risk  (09/16/2023)  Transportation Needs: No Transportation Needs (09/16/2023)  Utilities: Not At Risk (09/16/2023)  Depression (PHQ2-9): Low Risk  (01/17/2023)  Recent Concern: Depression (PHQ2-9) - Medium Risk (01/10/2023)  Financial Resource Strain: High Risk (01/10/2023)  Social Connections: Unknown (12/15/2021)   Received from [facility]  Stress: No Stress Concern Present (02/28/2021)   Received from [facility], [facility]  Tobacco Use: Low Risk  (09/16/2023)   SDOH Interventions:     Readmission Risk Interventions     No data to display

## 2023-09-17 NOTE — Telephone Encounter (Signed)
 Patient Product/process Development Scientist completed.    The patient is insured through [employer] and [employer].     Ran test claim for Entresto  24-26 mg and was filled on 09/12/2022 should be a $4.00 copay  Ran test claim for Farxiga 10 mg and Not on Formulary  Ran test claim for Jardiance 10 mg and Requires Prior Authorization  This test claim was processed through [employer]- copay amounts may vary at other pharmacies due to [employer], or as the patient moves through the different stages of their insurance plan.     Reyes Sharps, CPHT Pharmacy Technician III Certified Patient Advocate [facility] Patient Advocate Team Direct Number: [phone]  Fax: [phone]

## 2023-09-17 NOTE — Plan of Care (Signed)

## 2023-09-17 NOTE — Progress Notes (Addendum)
 Advanced Heart Failure Rounding Note  Cardiologist: Dr. Cherrie  Chief Complaint: Acute on chronic systolic CHF  Subjective:    Sub-optimal diuresis with IV lasix .   No dyspnea at rest. Legs are very swollen and uncomfortable. He is having a hard time with fluid restriction.    Objective:   Weight Range: 132.4 kg Body mass index is 40.71 kg/m.   Vital Signs:   Temp:  [97.7 F (36.5 C)-98.1 F (36.7 C)] 97.7 F (36.5 C) (02/04 0738) Pulse Rate:  [112-116] 112 (02/04 0738) Resp:  [18-20] 20 (02/04 0738) BP: (94-131)/(78-111) 114/89 (02/04 0738) SpO2:  [95 %-97 %] 95 % (02/04 0738) Weight:  [132.4 kg-136 kg] 132.4 kg (02/04 0435) Last BM Date : 09/15/23  Weight change: Filed Weights   09/16/23 1646 09/17/23 0435  Weight: 136 kg 132.4 kg    Intake/Output:   Intake/Output Summary (Last 24 hours) at 09/17/2023 0811 Last data filed at 09/17/2023 0300 Gross per 24 hour  Intake 237 ml  Output 300 ml  Net -63 ml      Physical Exam    General: Chronically ill appearing HEENT: Normal Neck: JVP to jaw. Cor: PMI nondisplaced. Regular rate & rhythm. No rubs, gallops or murmurs. Lungs: Clear Abdomen: + distended Extremities: No cyanosis, clubbing, rash, 3-4+ edema Neuro: Alert & orientedx3. Affect pleasant   Telemetry   ST 110s   Labs    CBC Recent Labs    09/16/23 1838  WBC 6.1  NEUTROABS 3.7  HGB 13.4  HCT 43.5  MCV 89.9  PLT 239   Basic Metabolic Panel Recent Labs    97/96/74 1838 09/17/23 0507  NA 135 134*  K 4.3 4.3  CL 98 97*  CO2 26 26  GLUCOSE 135* 110*  BUN 32* 31*  CREATININE 1.39* 1.34*  CALCIUM  8.4* 8.5*  MG  --  1.5*   Liver Function Tests Recent Labs    09/16/23 1838  AST 35  ALT 16  ALKPHOS 151*  BILITOT 1.3*  PROT 8.1  ALBUMIN 2.3*   No results for input(s): LIPASE, AMYLASE in the last 72 hours. Cardiac Enzymes No results for input(s): CKTOTAL, CKMB, CKMBINDEX, TROPONINI in the last 72  hours.  BNP: BNP (last 3 results) Recent Labs    07/24/23 1451 07/30/23 0044 08/19/23 1536  BNP 1,670.4* 2,303.1* >4,500.0*    ProBNP (last 3 results) No results for input(s): PROBNP in the last 8760 hours.   D-Dimer No results for input(s): DDIMER in the last 72 hours. Hemoglobin A1C No results for input(s): HGBA1C in the last 72 hours. Fasting Lipid Panel No results for input(s): CHOL, HDL, LDLCALC, TRIG, CHOLHDL, LDLDIRECT in the last 72 hours. Thyroid Function Tests No results for input(s): TSH, T4TOTAL, T3FREE, THYROIDAB in the last 72 hours.  Invalid input(s): FREET3  Other results:   Imaging    No results found.   Medications:     Scheduled Medications:  digoxin   0.125 mg Oral Daily   furosemide   80 mg Intravenous BID   heparin   5,000 Units Subcutaneous Q8H   insulin  aspart  0-20 Units Subcutaneous Q4H   potassium chloride   40 mEq Oral BID   sacubitril -valsartan   1 tablet Oral BID   sodium chloride  flush  3 mL Intravenous Q12H   spironolactone   25 mg Oral Daily    Infusions:  sodium chloride       PRN Medications: sodium chloride , acetaminophen , ondansetron  (ZOFRAN ) IV, mouth rinse, sodium chloride  flush   Assessment/Plan  Acute on Chronic Systolic Heart Failure - NICM. Reports hx CHF in his mother. No hx alcohol  or drug abuse. 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 RVEF 22% LGE imaging very poor quality. RV insertion site LGE (associated with worse prognosis) - Echo (5/24): EF < 20%, LV with GHK, RV mildly reduced, LA/RA sev dilated, mild-mod MR, mild TR, trivial pericardial effusion.  - Echo 11/24: EF 20-25%, RV mildly reduced,  mild MR - Massively volume overloaded. Poor response to IV lasix . Start lasix  gtt at 20/hr and give 10 metolazone  BID. This regimen worked well for him last admission.  - Continue digoxin  0.125 mg daily, check dig level - Continue entresto  24/26 mg BID - Continue spiro 25 mg daily - He is end-stage now with recurrent massive volume overload and he has again failed outpatient management with high-dose oral diuretics.  Suspect non-compliance is contributing. - Not a candidate for advanced therapies given chronic wounds and social situation - We have previously offered Palliative services to him but he is not interested in having people come to his house   2. Chronic foot wounds Hx osteomyelitis s/p right TMA - In setting of poorly controlled DM - Treated for cellulitis/osteomyelitis recent admit. Given 6 week course po abx at discharge but concerned about compliance.  - He still has b/l foot wounds. Seen by Wound Care. Photos from 02/03 reviewed in media tab. - Will touch base with ID regarding antibiotic regimen. - He has been afebrile. No leukocytosis.   3. Uncontrolled DM2 - Most recent A1C 10.3 11/24 - Not a good candidate for SGLT2i - SSI while inpatient   4. SDOH  - Now has medicaid - Lives in a camper behind father's house. Unemployed and has limited social support. - Declined Paramedicine and Palliative support on multiple occasions    Length of Stay: 1  FINCH, LINDSAY N, PA-C  09/17/2023, 8:11 AM  Advanced Heart Failure Team Pager [phone] (M-F; 7a - 5p)  Please contact [facility] for night-coverage after hours (5p -7a ) and weekends on [email or web address]   Patient seen with PA, I formulated plan and agree with the above note.   Diuresis not vigorous overnight.  Creatinine stable at 1.34. SBP 110s.   General: NAD Neck: JVP 14-16 cm, no thyromegaly or thyroid nodule.  Lungs: Clear to auscultation bilaterally with normal respiratory effort. CV: Nondisplaced PMI.  Heart  regular S1/S2, no S3/S4, no murmur. 2+ woody edema to thighs.  Abdomen: Soft, nontender, no hepatosplenomegaly, no distention.  Skin: Intact without lesions or rashes.  Neurologic: Alert and oriented x 3.  Psych: Normal affect. Extremities: s/p right TMA, ulcers on feet.  HEENT: Normal.   Marked volume overload, nonischemic cardiomyopathy.  As above, not candidate for advanced therapies.  Suspect end stage.  - Continue current digoxin  (check level), spironolactone , and Entresto .  - With poor diuresis, will transition to regimen that was effective last admission => Lasix  gtt 20 mg/hr + metolazone  10 mg bid.    H/o osteomyelitis and right TMA.  Unsure if he actually completed his outpatient antibiotic regimen.  Afebrile, normal WBCs.  Will ask ID to see while here to determine if we need to restart antibiotics.   Ezra Shuck 09/17/2023  10:31 AM

## 2023-09-17 NOTE — Consult Note (Addendum)
 WOC Nurse Consult Note: Reason for Consult: Requested to assess sacrum wound, and DFU (left heel and right foot). Wound type: Diabetic foot ulcers and Pressure injury stage 2. Pressure Injury POA: Yes Measurement: Sacrum - 0.5cm x 0.5cm x 0.1cm Wound bed: 100% yellow/white Drainage (amount, consistency, odor) Scant amount. Periwound: intact Dressing procedure/placement/frequency: Apply foam dressing, change every 3 days or PRN soiling. Obs: pt moves without help.  Measurement: Left heel - 3cm x 3.5cm x 1cm Wound bed: 100% yellow. Drainage (amount, consistency, odor) medium amount. Periwound: queratosis, dry cracked, fissures. Dressing procedure/placement/frequency: Apply Aquacel [id number] on the wound bed. In the dry cracked on 9 to 12 o'clock position, apply Xeroform. Change daily. Wrap with Kerlix and abd pad.  Conservative sharp wound debridement Debrided part of dead tissue peri-wound, square technique on the wound bed with a sharp and forcep.  Measurement: Right foot - 5cm x 6cm x 2cm Wound bed: 100% yellow. Drainage (amount, consistency, odor) medium amount. Periwound: queratosis,  Dressing procedure/placement/frequency: Apply Aquacel [id number] on the wound bed. Change daily. Wrap with Kerlix and abd pad.  WOC team will not plan to follow further.  Please reconsult if further assistance is needed. Thank-you,  Lela Holm BSN, RN, [employer], WOC  (Pager: [phone])

## 2023-09-17 NOTE — Progress Notes (Signed)
   09/17/23 1345  Spiritual Encounters  Type of Visit Initial  Care provided to: Patient  Reason for visit Advance directives  OnCall Visit No   Chaplain met with Pt and gave him AD education and AD document. Pt accepted info and document. No further spiritual need at this time.  Chaplain Therisa Samuel

## 2023-09-18 ENCOUNTER — Other Ambulatory Visit: Payer: Self-pay | Admitting: *Deleted

## 2023-09-18 DIAGNOSIS — I5023 Acute on chronic systolic (congestive) heart failure: Secondary | ICD-10-CM

## 2023-09-18 LAB — GLUCOSE, CAPILLARY
Glucose-Capillary: 141 mg/dL — ABNORMAL HIGH (ref 70–99)
Glucose-Capillary: 161 mg/dL — ABNORMAL HIGH (ref 70–99)
Glucose-Capillary: 176 mg/dL — ABNORMAL HIGH (ref 70–99)
Glucose-Capillary: 59 mg/dL — ABNORMAL LOW (ref 70–99)
Glucose-Capillary: 78 mg/dL (ref 70–99)
Glucose-Capillary: 81 mg/dL (ref 70–99)
Glucose-Capillary: 81 mg/dL (ref 70–99)

## 2023-09-18 LAB — BASIC METABOLIC PANEL
Anion gap: 10 (ref 5–15)
Anion gap: 10 (ref 5–15)
BUN: 31 mg/dL — ABNORMAL HIGH (ref 6–20)
BUN: 32 mg/dL — ABNORMAL HIGH (ref 6–20)
CO2: 29 mmol/L (ref 22–32)
CO2: 34 mmol/L — ABNORMAL HIGH (ref 22–32)
Calcium: 8.7 mg/dL — ABNORMAL LOW (ref 8.9–10.3)
Calcium: 8.8 mg/dL — ABNORMAL LOW (ref 8.9–10.3)
Chloride: 95 mmol/L — ABNORMAL LOW (ref 98–111)
Chloride: 91 mmol/L — ABNORMAL LOW (ref 98–111)
Creatinine, Ser: 1.18 mg/dL (ref 0.61–1.24)
Creatinine, Ser: 1.1 mg/dL (ref 0.61–1.24)
GFR, Estimated: >60 mL/min (ref >60)
GFR, Estimated: >60 mL/min (ref >60)
Glucose, Bld: 81 mg/dL (ref 70–99)
Glucose, Bld: 125 mg/dL — ABNORMAL HIGH (ref 70–99)
Potassium: 4.1 mmol/L (ref 3.5–5.1)
Potassium: 4.9 mmol/L (ref 3.5–5.1)
Sodium: 134 mmol/L — ABNORMAL LOW (ref 135–145)
Sodium: 135 mmol/L (ref 135–145)

## 2023-09-18 LAB — MAGNESIUM
Magnesium: 1.5 mg/dL — ABNORMAL LOW (ref 1.7–2.4)
Magnesium: 1.8 mg/dL (ref 1.7–2.4)

## 2023-09-18 LAB — DIGOXIN LEVEL: Digoxin Level: <0.2 ng/mL — ABNORMAL LOW (ref 0.8–2.0)

## 2023-09-18 MED ORDER — METOLAZONE 5 MG PO TABS
10.0000 mg | ORAL_TABLET | Freq: Once | ORAL | Status: AC
  Administered 2023-09-18: 10 mg via ORAL
  Filled 2023-09-18 (×2): qty 2

## 2023-09-18 MED ORDER — INSULIN ASPART 100 UNIT/ML IJ SOLN
0.0000 [IU] | INTRAMUSCULAR | Status: DC
  Administered 2023-09-18: 2 [IU] via SUBCUTANEOUS
  Administered 2023-09-18 – 2023-09-19 (×3): 3 [IU] via SUBCUTANEOUS
  Administered 2023-09-19: 2 [IU] via SUBCUTANEOUS
  Administered 2023-09-19 (×2): 3 [IU] via SUBCUTANEOUS
  Administered 2023-09-19 – 2023-09-20 (×5): 2 [IU] via SUBCUTANEOUS

## 2023-09-18 MED ORDER — POLYETHYLENE GLYCOL 3350 17 G PO PACK
17.0000 g | PACK | Freq: Every day | ORAL | Status: DC
Start: 2023-09-18 — End: 2023-09-20
  Filled 2023-09-18: qty 1

## 2023-09-18 MED ORDER — SENNOSIDES-DOCUSATE SODIUM 8.6-50 MG PO TABS
1.0000 | ORAL_TABLET | Freq: Every day | ORAL | Status: DC
Start: 2023-09-18 — End: 2023-09-20
  Administered 2023-09-18 – 2023-09-19 (×2): 1 via ORAL
  Filled 2023-09-18 (×2): qty 1

## 2023-09-18 MED ORDER — MAGNESIUM SULFATE 4 GM/100ML IV SOLN
4.0000 g | Freq: Once | INTRAVENOUS | Status: AC
Start: 2023-09-18 — End: 2023-09-18
  Administered 2023-09-18: 4 g via INTRAVENOUS
  Filled 2023-09-18: qty 100

## 2023-09-18 MED ORDER — POTASSIUM CHLORIDE CRYS ER 20 MEQ PO TBCR
40.0000 meq | EXTENDED_RELEASE_TABLET | Freq: Two times a day (BID) | ORAL | Status: AC
  Administered 2023-09-18 (×2): 40 meq via ORAL
  Filled 2023-09-18 (×2): qty 2

## 2023-09-18 NOTE — Consult Note (Addendum)
 [facility]                                                                                        Patient Identification: Patient Name: Dakota Ramirez MRN: [id number] Admit Date: 09/16/2023  4:50 PM Today's Date: 09/18/2023 Reason for consult: Chronic DFU Requesting provider: Dr Ezra  Principal Problem:   Acute on chronic systolic CHF (congestive heart failure) (HCC)   Antibiotics:  None  Lines/Hardware:  Assessment # Chronic b/l diabetic foot wounds ( Left heel as well as Rt TMA site) - no symptoms or signs of acute infection - He thinks he took abtx when he was discharged last admisson in Dec 2024 - He agreed to be seen by Orthopedics inpatient. Do not think another course of prolonged abtx will cure his foot wounds  # Sacral stage 2DU - no signs of infection  # Acute on chronic CHF- management per Cardiology   # DM2, uncontrolled: A1c 10.3  Recommendations - No indication of abtx currently - Orthopedics evaluation IP to see if he agrees to have any surgical intervention  - If no plans for intervention, would not do further abtx as unlikely to cure wounds. He is at risk for infection with open wounds and will need optimal wound care, blood glucose management and offloading. If any symptoms or signs of acute infection, will need abtx as needed.  - d/w CHF team  ID will so, please call with questions or concerns or any changes.   Rest of the management as per the primary team. Please call with questions or concerns.  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   Afebrile Labs with AKI with creatinine 1.3, WBC 6.1 ID consulted for chronic b/l foot wounds.   He reports he did take the linezolid  and cefadroxil  which she was discharged in December.  Denies any recent fever, chills or sweats.  Denies any nausea, vomiting or diarrhea.  He reports shortness of breath as well as congestion. Denies any recent drainage, foul smell, redness in his foot wounds. He agreed to see orthopedics during this admission.   ROS: General- Denies fever, chills, loss of appetite and loss of weight HEENT - Denies headache, blurry vision, neck pain, sinus pain Chest - Denies any chest pain.  CVS- Denies any dizziness/lightheadedness, syncopal attacks, palpitations Abdomen- Denies any nausea, vomiting, abdominal pain, hematochezia and diarrhea Neuro - Denies any weakness, numbness, tingling sensation Psych - Denies any changes in mood irritability  or depressive symptoms GU- Denies any burning, dysuria, hematuria or increased frequency of urination Skin - denies any rashes/lesions MSK - denies any joint pain/swelling or restricted ROM   Past Medical History:  Diagnosis Date   CHF (congestive heart failure) (HCC)    Diabetes mellitus without complication (HCC)    Type II   Heart failure with reduced ejection fraction (HCC) 11/26/2021   Hypertension    Sepsis with encephalopathy and septic shock (HCC) 06/26/2023   Shock (HCC) 06/26/2023   Past Surgical History:  Procedure Laterality Date   AMPUTATION  Left 01/25/2023   Procedure: LEFT GREAT TOE AMPUTATION AND DEBRIDEMENT OF HEEL;  Surgeon: Harden Jerona GAILS, MD;  Location: [facility] OR;  Service: Orthopedics;  Laterality: Left;   BACK SURGERY     CHOLECYSTECTOMY     RIGHT/LEFT HEART CATH AND CORONARY ANGIOGRAPHY N/A 11/30/2021   Procedure: RIGHT/LEFT HEART CATH AND CORONARY ANGIOGRAPHY;  Surgeon: Cherrie Toribio SAUNDERS, MD;  Location: [facility] INVASIVE CV LAB;  Service: Cardiovascular;  Laterality: N/A;   TOE AMPUTATION Right    all toes on RT foot   TRANSESOPHAGEAL ECHOCARDIOGRAM (CATH LAB) N/A 07/01/2023   Procedure: TRANSESOPHAGEAL ECHOCARDIOGRAM;  Surgeon: Zenaida Morene PARAS, MD;  Location: [facility] INVASIVE CV LAB;  Service: Cardiovascular;  Laterality: N/A;   Scheduled Meds:  digoxin   0.125 mg Oral Daily   heparin   5,000 Units Subcutaneous Q8H   insulin  aspart  0-20 Units Subcutaneous Q4H   sacubitril -valsartan   1 tablet Oral BID   sodium chloride  flush  3 mL Intravenous Q12H   spironolactone   25 mg Oral Daily   Continuous Infusions:  furosemide  (LASIX ) 200 mg in dextrose  5 % 100 mL (2 mg/mL) infusion 20 mg/hr (09/18/23 0521)   PRN Meds:.acetaminophen , ondansetron  (ZOFRAN ) IV, mouth rinse, sodium chloride  flush  No Known Allergies  Social History   Socioeconomic History   Marital status: Single    Spouse name: Not on file   Number of children: Not on file   Years of education: Not on file   Highest education level: Not on file  Occupational History   Not on file  Tobacco Use   Smoking status: Never   Smokeless tobacco: Never  Vaping Use   Vaping status: Never Used  Substance and Sexual Activity   Alcohol  use: Not Currently    Comment: rare   Drug use: Never   Sexual activity: Not on file  Other Topics Concern   Not on file  Social History Narrative   Not on file   Social Drivers of Health   Financial Resource Strain: High Risk (01/10/2023)   Overall Financial Resource Strain (CARDIA)    Difficulty of Paying Living Expenses: Hard   Food Insecurity: Food Insecurity Present (09/16/2023)   Hunger Vital Sign    Worried About Running Out of Food in the Last Year: Never true    Ran Out of Food in the Last Year: Sometimes true  Transportation Needs: No Transportation Needs (09/16/2023)   PRAPARE - Administrator, Civil Service (Medical): No    Lack of Transportation (Non-Medical): No  Physical Activity: Not on file  Stress: No Stress Concern Present (02/28/2021)   Received from [facility] of Occupational Health - Occupational Stress Questionnaire    Feeling of Stress : Not at all  Social Connections: Unknown (12/15/2021)   Received from [facility]   Social Network    Social Network: Not on file  Intimate Partner  Violence: Not At Risk (09/16/2023)   Humiliation, Afraid, Rape, and Kick questionnaire    Fear of Current or Ex-Partner: No    Emotionally Abused: No    Physically Abused: No    Sexually Abused: No   Family History  Problem Relation Age of Onset   Diabetes Mother    Hypertension Mother    Vitals BP 111/77 (BP Location: Left Arm)   Pulse (!) 113   Temp 97.7 F (36.5 C) (Oral)   Resp 15   Ht 5' 11 (1.803 m)   Wt 129.7 kg   SpO2 93%   BMI 39.89 kg/m    Physical Exam Constitutional: Looks older than stated age, adult male sitting up in the bed    Comments: HEENT WNL  Cardiovascular:     Rate and Rhythm: Normal rate and regular rhythm.     Heart sounds: S1 and S2  Pulmonary:     Effort: Pulmonary effort is normal.     Comments: Normal breath sounds  Abdominal:     Palpations: Abdomen is soft.     Tenderness: Distended and nontender  Musculoskeletal:        General: No swelling or tenderness in peripheral joints.  Bilateral pedal edema with Chronic skin changes in the lower extremities.   Chronic left heel ulcer with pink granulation tissue and surrounding chronic appearing skin changes with fissures.  Chronic ulcer at the  right TMA site at the plantar aspect with no signs of acute infection or drainage  Sacral stage II pressure ulcer, no signs of infection  Skin:    Comments: no rashes   Neurological:     General: Awake, alert and oriented, grossly nonfocal and follows commands  Psychiatric:        Mood and Affect: Mood normal.    Pertinent Microbiology Results for orders placed or performed during the hospital encounter of 09/16/23  MRSA Next Gen by PCR, Nasal     Status: None   Collection Time: 09/17/23  3:10 AM   Specimen: Nasal Mucosa; Nasal Swab  Result Value Ref Range Status   MRSA by PCR Next Gen NOT DETECTED NOT DETECTED Final    Comment: (NOTE) The GeneXpert MRSA Assay (FDA approved for NASAL specimens only), is one component of a comprehensive MRSA colonization surveillance program. It is not intended to diagnose MRSA infection nor to guide or monitor treatment for MRSA infections. Test performance is not FDA approved in patients less than 36 years old. Performed at [facility], [address], [place], [place] [address]    Pertinent Lab seen by me:    Latest Ref Rng & Units 09/16/2023    6:38 PM 08/04/2023    2:53 AM 08/03/2023    2:40 AM  CBC  WBC 4.0 - 10.5 K/uL 6.1  7.6  7.6   Hemoglobin 13.0 - 17.0 g/dL 86.5  87.6  86.8   Hematocrit 39.0 - 52.0 % 43.5  38.0  41.1   Platelets 150 - 400 K/uL 239  215  259       Latest Ref Rng & Units 09/18/2023    4:38 AM 09/17/2023    2:33 PM 09/17/2023    5:07 AM  CMP  Glucose 70 - 99 mg/dL 81  877  889   BUN 6 - 20 mg/dL 31  32  31   Creatinine 0.61 - 1.24 mg/dL 8.81  8.53  8.65   Sodium 135 - 145 mmol/L 134  135  134  Potassium 3.5 - 5.1 mmol/L 4.1  4.6  4.3   Chloride 98 - 111 mmol/L 95  95  97   CO2 22 - 32 mmol/L 29  30  26    Calcium  8.9 - 10.3 mg/dL 8.7  8.9  8.5     Pertinent Imagings/Other Imagings Plain films and CT images have been personally visualized and interpreted; radiology reports have been reviewed. Decision  making incorporated into the Impression / Recommendations.  No results found.   I have personally spent 88 minutes involved in face-to-face and non-face-to-face activities for this patient on the day of the visit. Professional time spent includes the following activities: Preparing to see the patient (review of tests), Obtaining and/or reviewing separately obtained history (admission/discharge record), Performing a medically appropriate examination and/or evaluation , Ordering medications/tests/procedures, referring and communicating with other health care professionals, Documenting clinical information in the EMR, Independently interpreting results (not separately reported), Communicating results to the patient/family/caregiver, Counseling and educating the patient/family/caregiver and Care coordination (not separately reported).  Electronically signed by:   Plan d/w requesting provider as well as ID pharm D  Of note, portions of this note may have been created with voice recognition software. While this note has been edited for accuracy, occasional wrong-word or 'sound-a-like' substitutions may have occurred due to the inherent limitations of voice recognition software.   Annalee Orem, MD Infectious Disease Physician [facility] Pager: [phone]

## 2023-09-18 NOTE — Progress Notes (Signed)
 Advanced Heart Failure Rounding Note  Cardiologist: Dr. Cherrie  Chief Complaint: Acute on chronic systolic CHF  Subjective:    Improved diuresis on high dose lasix  gtt with metolazone , lytes reviewed and will continue today as he is still significantly volume up.    Objective:   Weight Range: 129.7 kg Body mass index is 39.89 kg/m.   Vital Signs:   Temp:  [97.6 F (36.4 C)-97.8 F (36.6 C)] 97.7 F (36.5 C) (02/05 0350) Pulse Rate:  [113-115] 113 (02/05 0350) Resp:  [15-20] 18 (02/05 0740) BP: (100-122)/(72-96) 122/89 (02/05 0740) SpO2:  [93 %-95 %] 93 % (02/05 0740) Weight:  [129.7 kg] 129.7 kg (02/05 0350) Last BM Date : 09/15/23  Weight change: Filed Weights   09/16/23 1646 09/17/23 0435 09/18/23 0350  Weight: 136 kg 132.4 kg 129.7 kg    Intake/Output:   Intake/Output Summary (Last 24 hours) at 09/18/2023 0912 Last data filed at 09/18/2023 0908 Gross per 24 hour  Intake 1023.05 ml  Output 5690 ml  Net -4666.95 ml      Physical Exam    General: Chronically ill appearing HEENT: Normal Neck: JVP to jaw. Cor: Tachycardic, systolic murmur Lungs: mild tachypnea Abdomen: + distended, distended abdominal veins Extremities: No cyanosis, clubbing, rash, 3-4+ edema Neuro: Alert & orientedx3. Affect pleasant   Telemetry   ST 110s   Labs    CBC Recent Labs    09/16/23 1838  WBC 6.1  NEUTROABS 3.7  HGB 13.4  HCT 43.5  MCV 89.9  PLT 239   Basic Metabolic Panel Recent Labs    97/95/74 0507 09/17/23 1433 09/18/23 0438  NA 134* 135 134*  K 4.3 4.6 4.1  CL 97* 95* 95*  CO2 26 30 29   GLUCOSE 110* 122* 81  BUN 31* 32* 31*  CREATININE 1.34* 1.46* 1.18  CALCIUM  8.5* 8.9 8.7*  MG 1.5*  --  1.5*   Liver Function Tests Recent Labs    09/16/23 1838  AST 35  ALT 16  ALKPHOS 151*  BILITOT 1.3*  PROT 8.1  ALBUMIN 2.3*   No results for input(s): LIPASE, AMYLASE in the last 72 hours. Cardiac Enzymes No results for input(s):  CKTOTAL, CKMB, CKMBINDEX, TROPONINI in the last 72 hours.  BNP: BNP (last 3 results) Recent Labs    07/24/23 1451 07/30/23 0044 08/19/23 1536  BNP 1,670.4* 2,303.1* >4,500.0*    ProBNP (last 3 results) No results for input(s): PROBNP in the last 8760 hours.   D-Dimer No results for input(s): DDIMER in the last 72 hours. Hemoglobin A1C No results for input(s): HGBA1C in the last 72 hours. Fasting Lipid Panel No results for input(s): CHOL, HDL, LDLCALC, TRIG, CHOLHDL, LDLDIRECT in the last 72 hours. Thyroid Function Tests No results for input(s): TSH, T4TOTAL, T3FREE, THYROIDAB in the last 72 hours.  Invalid input(s): FREET3  Other results:     Medications:     Scheduled Medications:  digoxin   0.125 mg Oral Daily   heparin   5,000 Units Subcutaneous Q8H   insulin  aspart  0-20 Units Subcutaneous Q4H   sacubitril -valsartan   1 tablet Oral BID   sodium chloride  flush  3 mL Intravenous Q12H   spironolactone   25 mg Oral Daily    Infusions:  furosemide  (LASIX ) 200 mg in dextrose  5 % 100 mL (2 mg/mL) infusion 20 mg/hr (09/18/23 0521)    PRN Medications: acetaminophen , ondansetron  (ZOFRAN ) IV, mouth rinse, sodium chloride  flush   Assessment/Plan   Acute on Chronic Systolic Heart Failure -  NICM. Reports hx CHF in his mother. No hx alcohol  or drug abuse. - R/LHC (4/23): showed normal cors, severe NICM EF 20-25% and mildly elevated filling pressures w/ normal CO - cMRI (4/24): showed severe biventricular failure, LVEF 17%. RVEF 22% LGE imaging very poor quality. RV insertion site LGE (associated with worse prognosis) - Echo 11/24: EF 20-25%, RV mildly reduced, mild MR - Massively volume overloaded. Responding well now to increased IV lasix  gtt, will continue today - Continue digoxin  0.125 mg daily, dig level <0.2, reviewed - Continue entresto  24/26 mg BID - Continue spiro 25 mg daily - He is end-stage heart failure with recurrent  massive volume overload and he has again failed outpatient management with high-dose oral diuretics.  Suspect non-compliance is contributing. - Not a candidate for advanced therapies given chronic wounds and social situation - We have previously offered Palliative/paramedicine services to him but he is not interested in having people come to his house   2. Chronic foot wounds Hx osteomyelitis s/p right TMA - In setting of poorly controlled DM - Treated for cellulitis/osteomyelitis recent admit. Given 6 week course po abx at discharge but concerned about compliance.  - He still has b/l foot wounds. Seen by Wound Care. Photos from 02/03 reviewed in media tab. - ID consulted given chronic osteo - He has been afebrile. No leukocytosis.   3. Uncontrolled DM2 - Most recent A1C 10.3 11/24 - Not a good candidate for SGLT2i - SSI while inpatient   4. SDOH  - Now has medicaid - Lives in a camper behind father's house. Unemployed and has limited social support. - Declined Paramedicine and Palliative support on multiple occasions    Length of Stay: 2  Morene JINNY Brownie, MD  09/18/2023, 9:12 AM  Advanced Heart Failure Team Pager [phone] (M-F; 7a - 5p)  Please contact [facility] for night-coverage after hours (5p -7a ) and weekends on [email or web address]

## 2023-09-18 NOTE — Plan of Care (Signed)

## 2023-09-18 NOTE — Inpatient Diabetes Management (Signed)
 Inpatient Diabetes Program Recommendations  AACE/ADA: New Consensus Statement on Inpatient Glycemic Control (2015)  Target Ranges:  Prepandial:   less than 140 mg/dL      Peak postprandial:   less than 180 mg/dL (1-2 hours)      Critically ill patients:  140 - 180 mg/dL   Lab Results  Component Value Date   GLUCAP 81 09/18/2023   HGBA1C 10.3 (H) 06/26/2023    Review of Glycemic Control  Latest Reference Range & Units 09/17/23 07:41 09/17/23 12:06 09/17/23 16:32 09/17/23 20:25 09/18/23 00:10 09/18/23 03:47 09/18/23 07:06 09/18/23 07:37  Glucose-Capillary 70 - 99 mg/dL 882 (H) 878 (H) 882 (H) 155 (H) 78 81 59 (L) 81   Diabetes history: DM 2 Outpatient Diabetes medications: 70/30 30 units bid Current orders for Inpatient glycemic control:  Novolog  0-20 units Q4 hours  A1c 10.3% on 11/13- Diabetes Coordinator spoke with pt on 11/18, at the time was not consistently checking glucose levels and needed refills on his medication.  Inpatient Diabetes Program Recommendations:    Note: Diet ordered -   Decrease Novolog  Correction to 0-15 units tid + hs scale (currently Q4 hours)  Thanks,  Clotilda Bull RN, MSN, BC-ADM Inpatient Diabetes Coordinator Team Pager [phone] (8a-5p)

## 2023-09-18 NOTE — Patient Outreach (Signed)
 Dakota Ramirez is currently admitted as an inpatient at [facility]. The [facility] Managed Care team will follow the progress of Dakota Ramirez and follow up upon discharge.   Estanislado Emms RN, BSN [place]  [facility] RN Care Manager [phone]

## 2023-09-18 NOTE — Plan of Care (Signed)
  Problem: Education: Goal: Ability to describe self-care measures that may prevent or decrease complications (Diabetes Survival Skills Education) will improve Outcome: Progressing Goal: Individualized Educational Video(s) Outcome: Progressing   Problem: Coping: Goal: Ability to adjust to condition or change in health will improve Outcome: Progressing   Problem: Fluid Volume: Goal: Ability to maintain a balanced intake and output will improve Outcome: Progressing   Problem: Health Behavior/Discharge Planning: Goal: Ability to identify and utilize available resources and services will improve Outcome: Progressing Goal: Ability to manage health-related needs will improve Outcome: Progressing   Problem: Metabolic: Goal: Ability to maintain appropriate glucose levels will improve Outcome: Progressing   Problem: Nutritional: Goal: Maintenance of adequate nutrition will improve Outcome: Progressing Goal: Progress toward achieving an optimal weight will improve Outcome: Progressing   Problem: Skin Integrity: Goal: Risk for impaired skin integrity will decrease Outcome: Progressing   Problem: Tissue Perfusion: Goal: Adequacy of tissue perfusion will improve Outcome: Progressing   Problem: Education: Goal: Knowledge of General Education information will improve Description: Including pain rating scale, medication(s)/side effects and non-pharmacologic comfort measures Outcome: Progressing   Problem: Health Behavior/Discharge Planning: Goal: Ability to manage health-related needs will improve Outcome: Progressing   Problem: Clinical Measurements: Goal: Ability to maintain clinical measurements within normal limits will improve Outcome: Progressing Goal: Will remain free from infection Outcome: Progressing Goal: Diagnostic test results will improve Outcome: Progressing Goal: Respiratory complications will improve Outcome: Progressing Goal: Cardiovascular complication will  be avoided Outcome: Progressing   Problem: Activity: Goal: Risk for activity intolerance will decrease Outcome: Progressing   Problem: Nutrition: Goal: Adequate nutrition will be maintained Outcome: Progressing   Problem: Coping: Goal: Level of anxiety will decrease Outcome: Progressing   Problem: Pain Managment: Goal: General experience of comfort will improve and/or be controlled Outcome: Progressing   Problem: Safety: Goal: Ability to remain free from injury will improve Outcome: Progressing   Problem: Skin Integrity: Goal: Risk for impaired skin integrity will decrease Outcome: Progressing

## 2023-09-19 DIAGNOSIS — I5023 Acute on chronic systolic (congestive) heart failure: Secondary | ICD-10-CM | POA: Diagnosis not present

## 2023-09-19 LAB — BASIC METABOLIC PANEL
Anion gap: 10 (ref 5–15)
BUN: 29 mg/dL — ABNORMAL HIGH (ref 6–20)
CO2: 33 mmol/L — ABNORMAL HIGH (ref 22–32)
Calcium: 8.4 mg/dL — ABNORMAL LOW (ref 8.9–10.3)
Chloride: 91 mmol/L — ABNORMAL LOW (ref 98–111)
Creatinine, Ser: 1.01 mg/dL (ref 0.61–1.24)
GFR, Estimated: >60 mL/min (ref >60)
Glucose, Bld: 121 mg/dL — ABNORMAL HIGH (ref 70–99)
Potassium: 4.8 mmol/L (ref 3.5–5.1)
Sodium: 134 mmol/L — ABNORMAL LOW (ref 135–145)

## 2023-09-19 LAB — GLUCOSE, CAPILLARY
Glucose-Capillary: 123 mg/dL — ABNORMAL HIGH (ref 70–99)
Glucose-Capillary: 134 mg/dL — ABNORMAL HIGH (ref 70–99)
Glucose-Capillary: 137 mg/dL — ABNORMAL HIGH (ref 70–99)
Glucose-Capillary: 157 mg/dL — ABNORMAL HIGH (ref 70–99)
Glucose-Capillary: 159 mg/dL — ABNORMAL HIGH (ref 70–99)
Glucose-Capillary: 171 mg/dL — ABNORMAL HIGH (ref 70–99)

## 2023-09-19 LAB — MAGNESIUM: Magnesium: 1.4 mg/dL — ABNORMAL LOW (ref 1.7–2.4)

## 2023-09-19 MED ORDER — METOLAZONE 5 MG PO TABS
10.0000 mg | ORAL_TABLET | Freq: Once | ORAL | Status: AC
  Administered 2023-09-19: 10 mg via ORAL
  Filled 2023-09-19: qty 2

## 2023-09-19 MED ORDER — MAGNESIUM SULFATE 4 GM/100ML IV SOLN: 4.0000 g | Freq: Once | INTRAVENOUS | Status: DC

## 2023-09-19 MED ORDER — OXYCODONE HCL 5 MG PO TABS
2.5000 mg | ORAL_TABLET | Freq: Once | ORAL | Status: AC
  Administered 2023-09-19: 2.5 mg via ORAL
  Filled 2023-09-19: qty 1

## 2023-09-19 MED ORDER — ACETAZOLAMIDE 250 MG PO TABS
250.0000 mg | ORAL_TABLET | Freq: Once | ORAL | Status: AC
  Administered 2023-09-19: 250 mg via ORAL
  Filled 2023-09-19: qty 1

## 2023-09-19 MED ORDER — MAGNESIUM SULFATE 4 GM/100ML IV SOLN
4.0000 g | Freq: Once | INTRAVENOUS | Status: AC
  Administered 2023-09-19: 4 g via INTRAVENOUS
  Filled 2023-09-19: qty 100

## 2023-09-19 NOTE — Progress Notes (Signed)
 Mobility Specialist Progress Note;   09/19/23 1140  Mobility  Activity Transferred from bed to chair  Level of Assistance Standby assist, set-up cues, supervision of patient - no hands on  Assistive Device None  Distance Ambulated (ft) 3 ft  Activity Response Tolerated fair  Mobility Referral Yes  Mobility visit 1 Mobility  Mobility Specialist Start Time (ACUTE ONLY) 1140  Mobility Specialist Stop Time (ACUTE ONLY) 1150  Mobility Specialist Time Calculation (min) (ACUTE ONLY) 10 min   Pt in an agitated mood, agreeable to mobility w/ max encouragement. Deferred hallway ambulation, agreed to sit in chair for time being. Pt did not want MS to touch him to assist him to the chair. C/o a sore on his buttock bothering him. Pt left in chair with all needs met- call bell in reach. NT notified.   Lauraine Erm Mobility Specialist Please contact via SecureChat or [employer] [phone]

## 2023-09-19 NOTE — Progress Notes (Addendum)
 Advanced Heart Failure Rounding Note  Cardiologist: Dr. Cherrie  Chief Complaint: Acute on chronic systolic CHF  Subjective:    Continues to diurese will with lasix  gtt + metolazone .   Frustrated by lack of sleep d/t frequent interruptions. No dyspnea at rest. Edema improving.   Objective:   Weight Range: 122.6 kg Body mass index is 37.69 kg/m.   Vital Signs:   Temp:  [97.6 F (36.4 C)-97.9 F (36.6 C)] 97.9 F (36.6 C) (02/06 0749) Pulse Rate:  [112-117] 112 (02/06 0749) Resp:  [12-19] 19 (02/06 0749) BP: (103-130)/(76-97) 107/77 (02/06 0749) SpO2:  [92 %-94 %] 92 % (02/06 0749) Weight:  [122.6 kg] 122.6 kg (02/06 0616) Last BM Date : 09/17/22  Weight change: Filed Weights   09/17/23 0435 09/18/23 0350 09/19/23 0616  Weight: 132.4 kg 129.7 kg 122.6 kg    Intake/Output:   Intake/Output Summary (Last 24 hours) at 09/19/2023 0810 Last data filed at 09/19/2023 0749 Gross per 24 hour  Intake 1040.89 ml  Output 6850 ml  Net -5809.11 ml      Physical Exam    General:  Chronically ill appearing. HEENT: normal Neck: supple. JVP to jaw. Cor: PMI nondisplaced. Regular rate & rhythm, tachy. No rubs, gallops or murmurs. Lungs: crackles in bases Abdomen: soft, nontender, nondistended.  Extremities: no cyanosis, clubbing, 3+ lower extremity edema, dressings over lower extremity wounds Neuro: alert & orientedx3. Affect pleasant.  Skin: Dressing over sacral wound   Telemetry   ST 110s   Labs    CBC Recent Labs    09/16/23 1838  WBC 6.1  NEUTROABS 3.7  HGB 13.4  HCT 43.5  MCV 89.9  PLT 239   Basic Metabolic Panel Recent Labs    97/94/74 1751 09/19/23 0451  NA 135 134*  K 4.9 4.8  CL 91* 91*  CO2 34* 33*  GLUCOSE 125* 121*  BUN 32* 29*  CREATININE 1.10 1.01  CALCIUM  8.8* 8.4*  MG 1.8 1.4*   Liver Function Tests Recent Labs    09/16/23 1838  AST 35  ALT 16  ALKPHOS 151*  BILITOT 1.3*  PROT 8.1  ALBUMIN 2.3*   No results for  input(s): LIPASE, AMYLASE in the last 72 hours. Cardiac Enzymes No results for input(s): CKTOTAL, CKMB, CKMBINDEX, TROPONINI in the last 72 hours.  BNP: BNP (last 3 results) Recent Labs    07/24/23 1451 07/30/23 0044 08/19/23 1536  BNP 1,670.4* 2,303.1* >4,500.0*    ProBNP (last 3 results) No results for input(s): PROBNP in the last 8760 hours.   D-Dimer No results for input(s): DDIMER in the last 72 hours. Hemoglobin A1C No results for input(s): HGBA1C in the last 72 hours. Fasting Lipid Panel No results for input(s): CHOL, HDL, LDLCALC, TRIG, CHOLHDL, LDLDIRECT in the last 72 hours. Thyroid Function Tests No results for input(s): TSH, T4TOTAL, T3FREE, THYROIDAB in the last 72 hours.  Invalid input(s): FREET3  Other results:     Medications:     Scheduled Medications:  digoxin   0.125 mg Oral Daily   heparin   5,000 Units Subcutaneous Q8H   insulin  aspart  0-15 Units Subcutaneous Q4H   polyethylene glycol  17 g Oral Daily   sacubitril -valsartan   1 tablet Oral BID   senna-docusate  1 tablet Oral QHS   sodium chloride  flush  3 mL Intravenous Q12H   spironolactone   25 mg Oral Daily    Infusions:  furosemide  (LASIX ) 200 mg in dextrose  5 % 100 mL (2 mg/mL) infusion  20 mg/hr (09/19/23 0744)    PRN Medications: acetaminophen , ondansetron  (ZOFRAN ) IV, mouth rinse, sodium chloride  flush   Assessment/Plan   Acute on Chronic Systolic Heart Failure - NICM. Reports hx CHF in his mother. No hx alcohol  or drug abuse. - R/LHC (4/23): showed normal cors, severe NICM EF 20-25% and mildly elevated filling pressures w/ normal CO - cMRI (4/24): showed severe biventricular failure, LVEF 17%. RVEF 22% LGE imaging very poor quality. RV insertion site LGE (associated with worse prognosis) - Echo 11/24: EF 20-25%, RV mildly reduced, mild MR - Massively volume overloaded. Continues to respond well to lasix  gtt + metolazone . Will continue  current regimen today. Will also give one dose po diamox . - Continue digoxin  0.125 mg daily, dig level <0.2 - Continue entresto  24/26 mg BID - Continue spiro 25 mg daily - He is end-stage heart failure with recurrent massive volume overload and he has again failed outpatient management with high-dose oral diuretics.  Suspect non-compliance is contributing. Poor long-term prognosis. - Not a candidate for advanced therapies given chronic wounds and social situation - We have previously offered Palliative/paramedicine services to him but he is not interested in having people come to his house   2. Chronic foot wounds Hx osteomyelitis s/p right TMA - In setting of poorly controlled DM - Treated for cellulitis/osteomyelitis recent admit. Given 6 week course po abx at discharge but concerned about compliance.  - He still has b/l foot wounds. Seen by Wound Care. Photos from 02/03 reviewed in media tab. - ID consulted, felt no indication for antibiotics. He has been afebrile, no leukocytosis. Orthopedics consulted to see if he requires any surgical intervention.   3. Uncontrolled DM2 - Most recent A1C 10.3 11/24 - Not a good candidate for SGLT2i - SSI while inpatient  4. Sacral wound - Stage II pressure injury - Present on admit - Foam dressing intact. Appreciate WOC.  5. Hypomagnesemia - Mag 1.4 - Give 4 g IV today - Dose metolazone  for this afternoon (once mag supp completed)   6. SDOH  - Now has medicaid - Lives in a camper behind father's house. Unemployed and has limited social support. - Declined Paramedicine and Palliative support on multiple occasions    Length of Stay: 3  Tifany Hirsch N, PA-C  09/19/2023, 8:10 AM  Advanced Heart Failure Team Pager [phone] (M-F; 7a - 5p)  Please contact [facility] for night-coverage after hours (5p -7a ) and weekends on [email or web address]

## 2023-09-19 NOTE — Plan of Care (Signed)

## 2023-09-19 NOTE — Plan of Care (Signed)
  Problem: Clinical Measurements: Goal: Respiratory complications will improve Outcome: Progressing Goal: Cardiovascular complication will be avoided Outcome: Progressing   Problem: Activity: Goal: Risk for activity intolerance will decrease Outcome: Progressing   Problem: Pain Managment: Goal: General experience of comfort will improve and/or be controlled Outcome: Progressing   Problem: Safety: Goal: Ability to remain free from injury will improve Outcome: Progressing

## 2023-09-19 NOTE — Progress Notes (Signed)
 Patient ID: Dakota Ramirez, male   DOB: 26-Oct-1970, 53 y.o.   MRN: [id number] Patient was in a hurry to go to the bathroom.  I discussed that the MRI scans shows osteomyelitis involving both feet.  I we will follow-up tomorrow to discuss surgical options.

## 2023-09-20 ENCOUNTER — Other Ambulatory Visit (HOSPITAL_COMMUNITY): Payer: Self-pay

## 2023-09-20 DIAGNOSIS — M869 Osteomyelitis, unspecified: Secondary | ICD-10-CM

## 2023-09-20 DIAGNOSIS — I5023 Acute on chronic systolic (congestive) heart failure: Secondary | ICD-10-CM | POA: Diagnosis not present

## 2023-09-20 DIAGNOSIS — M86279 Subacute osteomyelitis, unspecified ankle and foot: Secondary | ICD-10-CM

## 2023-09-20 LAB — BASIC METABOLIC PANEL
Anion gap: 11 (ref 5–15)
BUN: 28 mg/dL — ABNORMAL HIGH (ref 6–20)
CO2: 38 mmol/L — ABNORMAL HIGH (ref 22–32)
Calcium: 8.9 mg/dL (ref 8.9–10.3)
Chloride: 85 mmol/L — ABNORMAL LOW (ref 98–111)
Creatinine, Ser: 1.06 mg/dL (ref 0.61–1.24)
GFR, Estimated: >60 mL/min (ref >60)
Glucose, Bld: 99 mg/dL (ref 70–99)
Potassium: 4.6 mmol/L (ref 3.5–5.1)
Sodium: 134 mmol/L — ABNORMAL LOW (ref 135–145)

## 2023-09-20 LAB — MAGNESIUM: Magnesium: 1.3 mg/dL — ABNORMAL LOW (ref 1.7–2.4)

## 2023-09-20 LAB — GLUCOSE, CAPILLARY
Glucose-Capillary: 125 mg/dL — ABNORMAL HIGH (ref 70–99)
Glucose-Capillary: 126 mg/dL — ABNORMAL HIGH (ref 70–99)
Glucose-Capillary: 139 mg/dL — ABNORMAL HIGH (ref 70–99)
Glucose-Capillary: 97 mg/dL (ref 70–99)

## 2023-09-20 MED ORDER — TORSEMIDE 20 MG PO TABS
80.0000 mg | ORAL_TABLET | Freq: Two times a day (BID) | ORAL | 3 refills | Status: DC
  Filled 2023-09-20: qty 120, 15d supply, fill #0

## 2023-09-20 MED ORDER — TORSEMIDE 20 MG PO TABS: 40.0000 mg | ORAL_TABLET | Freq: Two times a day (BID) | ORAL | Status: DC

## 2023-09-20 MED ORDER — MAGNESIUM OXIDE -MG SUPPLEMENT 400 (240 MG) MG PO TABS
200.0000 mg | ORAL_TABLET | Freq: Every day | ORAL | 3 refills | Status: DC
  Filled 2023-09-20: qty 15, 30d supply, fill #0

## 2023-09-20 MED ORDER — ACETAZOLAMIDE 250 MG PO TABS
500.0000 mg | ORAL_TABLET | Freq: Two times a day (BID) | ORAL | Status: DC
  Administered 2023-09-20: 500 mg via ORAL
  Filled 2023-09-20 (×2): qty 2

## 2023-09-20 MED ORDER — MAGNESIUM OXIDE -MG SUPPLEMENT 400 (240 MG) MG PO TABS
400.0000 mg | ORAL_TABLET | Freq: Every day | ORAL | 3 refills | Status: DC
  Filled 2023-09-20: qty 90, 90d supply, fill #0

## 2023-09-20 MED ORDER — MAGNESIUM SULFATE 4 GM/100ML IV SOLN
4.0000 g | Freq: Once | INTRAVENOUS | Status: AC
Start: 2023-09-20 — End: 2023-09-20
  Administered 2023-09-20: 4 g via INTRAVENOUS
  Filled 2023-09-20: qty 100

## 2023-09-20 MED ORDER — MAGNESIUM SULFATE 2 GM/50ML IV SOLN
2.0000 g | Freq: Once | INTRAVENOUS | Status: AC
  Administered 2023-09-20: 2 g via INTRAVENOUS
  Filled 2023-09-20: qty 50

## 2023-09-20 MED ORDER — TORSEMIDE 20 MG PO TABS
40.0000 mg | ORAL_TABLET | Freq: Two times a day (BID) | ORAL | 6 refills | Status: DC
  Filled 2023-09-20: qty 120, 30d supply, fill #0

## 2023-09-20 MED ORDER — MAGNESIUM SULFATE 4 GM/100ML IV SOLN: 4.0000 g | Freq: Once | INTRAVENOUS | Status: DC

## 2023-09-20 MED ORDER — METOLAZONE 5 MG PO TABS: 5.0000 mg | ORAL_TABLET | Freq: Every day | ORAL | Status: DC | PRN

## 2023-09-20 NOTE — Discharge Summary (Signed)
 Advanced Heart Failure Team  Discharge Summary   Patient ID: Dakota Ramirez MRN: [id number], DOB/AGE: [birth year] 53 y.o. Admit date: 09/16/2023 D/C date:     09/20/2023   Primary Discharge Diagnoses:  Acute on chronic systolic heart failure Chronic foot wounds osteomyelitis  Secondary Discharge Diagnoses:  T2DM, uncontrolled Sacral wound Hypomagnesemia  Hospital Course:   Dakota Ramirez is a 53 y.o. 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    Admitted from AHF clinic on 09/16/23 with severe volume overload after failed diuresis with PO diuretics. GDMT restarted and started on aggressive diuresis with Lasix  gtt and intermittent metolazone  and diamox  dosing. Overall diuresed 60 lbs from admission. Given history of chronic foot wound and infections, ID consulted, no need for antibiotics at this time. They addressed concerns for osteomyelitis, so ortho consulted, who recommended amputation. The patient requested to go home and think about surgical procedure for his osteo. Transitioned to oral diuretics. Continue on home GDMT.  Seen and evaluated by Dr. Zenaida today and deemed appropriate for discharge with close follow up in AHF Clinic.    Hospital Course by Problem List:   Acute on Chronic Systolic Heart Failure - NICM. Reports hx CHF in his mother. No hx alcohol  or drug abuse. - R/LHC (4/23): showed normal cors, severe NICM EF 20-25% and mildly elevated filling pressures w/ normal CO - cMRI (4/24): showed severe biventricular failure, LVEF 17%. RVEF 22% LGE imaging very poor quality. RV insertion site LGE (associated with worse prognosis) - Echo 11/24: EF 20-25%, RV mildly reduced, mild MR - Weight down 60 lbs from admission - Start Torsemide  40 mg bid - Continue digoxin  0.125 mg daily - Continue entresto  24/26 mg bid - Continue spiro 25 mg daily - He is end-stage heart failure with  recurrent massive volume overload and he has again failed outpatient management with high-dose oral diuretics.  Suspect non-compliance is contributing. Poor long-term prognosis. - Not a candidate for advanced therapies given chronic wounds and social situation - We have previously offered Palliative/paramedicine services to him but he is not interested in having people come to his house   2. Chronic foot wounds Hx osteomyelitis s/p right TMA - In setting of poorly controlled DM - Treated for cellulitis/osteomyelitis recent admit. Given 6 week course po abx at discharge but concerned about compliance.  - He still has b/l foot wounds. Seen by Wound Care. Photos from 02/03 reviewed in media tab. - ID consulted, felt no indication for antibiotics. He has been afebrile, no leukocytosis. Orthopedics consulted to see if he requires any surgical intervention.    3. Uncontrolled DM2 - Most recent A1C 10.3 11/24 - Not a good candidate for SGLT2i - continue insulin  use   4. Sacral wound - Stage II pressure injury - Present on admit   5. Hypomagnesemia - mag ox 200 mg daily   6. SDOH  - Now has medicaid - Lives in a camper behind father's house. Unemployed and has limited social support. - Declined Paramedicine and Palliative support on multiple occasions  Discharge Weight Range: 239 lb Discharge Vitals: Blood pressure 116/81, pulse (!) 119, temperature 97.9 F (36.6 C), temperature source Oral, resp. rate 18, height 5' 11 (1.803 m), weight 108.7 kg, SpO2 100%.  Discharge Physical Exam: General: Chronically ill appearing.  Cardiac: S1 and S2 present. No murmurs or rub. Extremities: Warm and dry. No rash, cyanosis.  ble edema. Dressings  in place Neuro: Alert and oriented x3. Affect pleasant.  Labs: Lab Results  Component Value Date   WBC 6.1 09/16/2023   HGB 13.4 09/16/2023   HCT 43.5 09/16/2023   MCV 89.9 09/16/2023   PLT 239 09/16/2023    Recent Labs  Lab 09/16/23 1838  09/17/23 0507 09/20/23 0337  NA 135   < > 134*  K 4.3   < > 4.6  CL 98   < > 85*  CO2 26   < > 38*  BUN 32*   < > 28*  CREATININE 1.39*   < > 1.06  CALCIUM  8.4*   < > 8.9  PROT 8.1  --   --   BILITOT 1.3*  --   --   ALKPHOS 151*  --   --   ALT 16  --   --   AST 35  --   --   GLUCOSE 135*   < > 99   < > = values in this interval not displayed.   Lab Results  Component Value Date   CHOL 93 11/28/2021   HDL 26 (L) 11/28/2021   LDLCALC 53 11/28/2021   TRIG 72 11/28/2021   BNP (last 3 results) Recent Labs    07/24/23 1451 07/30/23 0044 08/19/23 1536  BNP 1,670.4* 2,303.1* >4,500.0*    ProBNP (last 3 results) No results for input(s): PROBNP in the last 8760 hours.   Diagnostic Studies/Procedures   No results found.  Discharge Medications   Allergies as of 09/20/2023   No Known Allergies      Medication List     STOP taking these medications    bumetanide  2 MG tablet Commonly known as: BUMEX        TAKE these medications    digoxin  0.125 MG tablet Commonly known as: LANOXIN  Take 1 tablet (0.125 mg total) by mouth daily.   magnesium  oxide 400 (240 Mg) MG tablet Commonly known as: MagOx 400 Take 1 tablet (400 mg total) by mouth daily at 12 noon.   metolazone  5 MG tablet Commonly known as: ZAROXOLYN  Take 1 tablet (5 mg total) by mouth daily as needed (as instructed by HF clinic for extra volume). Take an extra 2 potassium tablets with your Metolazone  What changed:  when to take this reasons to take this   NovoLOG  Mix 70/30 FlexPen (70-30) 100 UNIT/ML FlexPen Generic drug: insulin  aspart protamine - aspart Inject 30 Units into the skin 2 (two) times daily with a meal.   potassium chloride  SA 20 MEQ tablet Commonly known as: KLOR-CON  M Take 2 tablets (40 mEq total) by mouth daily. Take extra 40 meq on day you take metolazone . Take extra 40 mg on days you use Furoxcix.   sacubitril -valsartan  24-26 MG Commonly known as: ENTRESTO  Take 1 tablet by  mouth 2 (two) times daily.   spironolactone  25 MG tablet Commonly known as: ALDACTONE  Take 1 tablet (25 mg total) by mouth daily.   torsemide  20 MG tablet Commonly known as: DEMADEX  Take 4 tablets (80 mg total) by mouth 2 (two) times daily.        Disposition   The patient will be discharged in stable condition to home. Discharge Instructions     (HEART FAILURE PATIENTS) Call MD:  Anytime you have any of the following symptoms: 1) 3 pound weight gain in 24 hours or 5 pounds in 1 week 2) shortness of breath, with or without a dry hacking cough 3) swelling in the hands, feet or stomach  4) if you have to sleep on extra pillows at night in order to breathe.   Complete by: As directed    Diet - low sodium heart healthy   Complete by: As directed    Increase activity slowly   Complete by: As directed        Follow-up Information     Gregary Sharper, MD. Go in 5 day(s).   Specialty: Internal Medicine Why: Hospital follow up appointment scheduled for Friday, September 27, 2023 at 9:45 AM.  PLEASE ARRIVE 10-15 miutes early.  PLEASE call to cancel/reschedule if you CANNOT make appointment. Contact information: [address] [place] [address] [phone]         [facility] Follow up on 10/02/2023.   Specialty: Cardiology Why: at 1:30 pm Contact information: [address] [place]  [phone]                  Total time spent on discharge 20 minutes.  Duration of Discharge Encounter: Greater than 35 minutes   Signed, Akshat Minehart, NP 09/20/2023, 1:59 PM

## 2023-09-20 NOTE — Consult Note (Signed)
 ORTHOPAEDIC CONSULTATION  REQUESTING PHYSICIAN: Zenaida Morene PARAS, MD  Chief Complaint: Chronic ulcers both feet.  HPI: Dakota Ramirez is a 53 y.o. male who presents with monic ulceration both feet.  Patient has a history of uncontrolled type 2 diabetes with heart failure with reduced ejection fraction.  Past Medical History:  Diagnosis Date   CHF (congestive heart failure) (HCC)    Diabetes mellitus without complication (HCC)    Type II   Heart failure with reduced ejection fraction (HCC) 11/26/2021   Hypertension    Sepsis with encephalopathy and septic shock (HCC) 06/26/2023   Shock (HCC) 06/26/2023   Past Surgical History:  Procedure Laterality Date   AMPUTATION Left 01/25/2023   Procedure: LEFT GREAT TOE AMPUTATION AND DEBRIDEMENT OF HEEL;  Surgeon: Harden Jerona GAILS, MD;  Location: [facility] OR;  Service: Orthopedics;  Laterality: Left;   BACK SURGERY     CHOLECYSTECTOMY     RIGHT/LEFT HEART CATH AND CORONARY ANGIOGRAPHY N/A 11/30/2021   Procedure: RIGHT/LEFT HEART CATH AND CORONARY ANGIOGRAPHY;  Surgeon: Cherrie Toribio SAUNDERS, MD;  Location: [facility] INVASIVE CV LAB;  Service: Cardiovascular;  Laterality: N/A;   TOE AMPUTATION Right    all toes on RT foot   TRANSESOPHAGEAL ECHOCARDIOGRAM (CATH LAB) N/A 07/01/2023   Procedure: TRANSESOPHAGEAL ECHOCARDIOGRAM;  Surgeon: Zenaida Morene PARAS, MD;  Location: [facility] INVASIVE CV LAB;  Service: Cardiovascular;  Laterality: N/A;   Social History   Socioeconomic History   Marital status: Single    Spouse name: Not on file   Number of children: Not on file   Years of education: Not on file   Highest education level: Not on file  Occupational History   Not on file  Tobacco Use   Smoking status: Never   Smokeless tobacco: Never  Vaping Use   Vaping status: Never Used  Substance and Sexual Activity   Alcohol  use: Not Currently    Comment: rare   Drug use: Never   Sexual activity: Not on file  Other Topics Concern   Not on file  Social  History Narrative   Not on file   Social Drivers of Health   Financial Resource Strain: High Risk (01/10/2023)   Overall Financial Resource Strain (CARDIA)    Difficulty of Paying Living Expenses: Hard  Food Insecurity: Food Insecurity Present (09/16/2023)   Hunger Vital Sign    Worried About Running Out of Food in the Last Year: Never true    Ran Out of Food in the Last Year: Sometimes true  Transportation Needs: No Transportation Needs (09/16/2023)   PRAPARE - Administrator, Civil Service (Medical): No    Lack of Transportation (Non-Medical): No  Physical Activity: Not on file  Stress: No Stress Concern Present (02/28/2021)   Received from [facility], [facility] of Occupational [facility] - Occupational Stress Questionnaire    Feeling of Stress : Not at all  Social Connections: Unknown (12/15/2021)   Received from [facility], [facility]   Social Network    Social Network: Not on file   Family History  Problem Relation Age of Onset   Diabetes Mother    Hypertension Mother    - negative except otherwise stated in the family history section No Known Allergies Prior to Admission medications   Medication Sig Start Date End Date Taking? Authorizing Provider  digoxin  (LANOXIN ) 0.125 MG tablet Take 1 tablet (0.125 mg total) by mouth daily. 08/19/23  Yes Bensimhon, Toribio SAUNDERS, MD  NOVOLOG  MIX 70/30 FLEXPEN (70-30) 100 UNIT/ML FlexPen Inject 30 Units into the skin 2 (two) times daily with a meal. 07/01/23  Yes Jolaine Pac, DO  potassium chloride  SA (KLOR-CON  M) 20 MEQ tablet Take 2 tablets (40 mEq total) by mouth daily. Take extra 40 meq on day you take metolazone . Take extra 40 mg on days you use Furoxcix. 08/04/23  Yes Harrie Bruckner, DO  sacubitril -valsartan  (ENTRESTO ) 24-26 MG Take 1 tablet by mouth 2 (two) times daily. 08/04/23  Yes Harrie Bruckner, DO  spironolactone  (ALDACTONE ) 25 MG tablet Take 1 tablet (25 mg total) by mouth daily.  08/04/23  Yes Juberg, Bruckner, DO  bumetanide  (BUMEX ) 2 MG tablet Take 2 tablets (4 mg total) by mouth 2 (two) times daily. Patient not taking: Reported on 09/16/2023 08/04/23   Juberg, Christopher, DO  magnesium  oxide (MAGOX 400) 400 (240 Mg) MG tablet Take 1 tablet (400 mg total) by mouth daily at 12 noon. 09/20/23   Zenaida Morene PARAS, MD  metolazone  (ZAROXOLYN ) 5 MG tablet Take 1 tablet (5 mg total) by mouth daily as needed (as instructed by HF clinic for extra volume). Take an extra 2 potassium tablets with your Metolazone  09/20/23   Zenaida Morene PARAS, MD  torsemide  (DEMADEX ) 20 MG tablet Take 4 tablets (80 mg total) by mouth 2 (two) times daily. 09/20/23   Zenaida Morene PARAS, MD   No results found. - pertinent xrays, CT, MRI studies were reviewed and independently interpreted  Positive ROS: All other systems have been reviewed and were otherwise negative with the exception of those mentioned in the HPI and as above.  Physical Exam: General: Alert, no acute distress Psychiatric: Patient is competent for consent with normal mood and affect Lymphatic: No axillary or cervical lymphadenopathy Cardiovascular: No pedal edema Respiratory: No cyanosis, no use of accessory musculature GI: No organomegaly, abdomen is soft and non-tender    Images:  @ENCIMAGES @  Labs:  Lab Results  Component Value Date   HGBA1C 10.3 (H) 06/26/2023   HGBA1C 11.2 (H) 12/26/2022   HGBA1C 12.4 (H) 11/26/2021   ESRSEDRATE 68 (H) 12/28/2022   CRP 5.6 (H) 12/28/2022   REPTSTATUS 08/04/2023 FINAL 07/30/2023   CULT  07/30/2023    NO GROWTH 5 DAYS Performed at [facility], [address] [place], [place] [address]     Lab Results  Component Value Date   ALBUMIN 2.3 (L) 09/16/2023   ALBUMIN 1.8 (L) 07/30/2023   ALBUMIN 1.8 (L) 07/30/2023        Latest Ref Rng & Units 09/16/2023    6:38 PM 08/04/2023    2:53 AM 08/03/2023    2:40 AM  CBC EXTENDED  WBC 4.0 - 10.5 K/uL 6.1  7.6  7.6   RBC 4.22  - 5.81 MIL/uL 4.84  4.49  4.84   Hemoglobin 13.0 - 17.0 g/dL 86.5  87.6  86.8   HCT 39.0 - 52.0 % 43.5  38.0  41.1   Platelets 150 - 400 K/uL 239  215  259   NEUT# 1.7 - 7.7 K/uL 3.7     Lymph# 0.7 - 4.0 K/uL 1.6       Neurologic: Patient does not have protective sensation bilateral lower extremities.   MUSCULOSKELETAL:   Skin: Examination patient has chronic ulcers on both lower extremities.  Review of the MRI scan shows osteomyelitis involving both feet.  There is no ascending cellulitis.  White cell count 6.1 with a hemoglobin of 13.4.  Albumin 2.3 with a  hemoglobin A1c consistently greater than 10 currently at 10.3.  Assessment: Assessment: Osteomyelitis both feet with chronic ulcerations.  Plan: Plan: Will plan for bilateral transtibial amputations.  Patient states he would like to discharge and schedule this electively.  He will call my office for follow-up.  Anticipate we could proceed with amputation on 1 leg at a time allow him to be ambulatory on the amputated limb and then proceed with amputation of the other limb.  Thank you for the consult and the opportunity to see Mr. Amour Cutrone, MD [facility] [phone] 2:53 PM

## 2023-09-20 NOTE — Progress Notes (Signed)
 Patient discharged to home, AVS reviewed. BLE dressings changed prior to discharge. TOC pharmacy meds delivered to room. Patient confirmed he had all belongings at the time of discharge. Staff member assisted patient to the exit.

## 2023-09-20 NOTE — TOC Transition Note (Signed)
 Transition of Care [facility]) - Discharge Note   Patient Details  Name: Dakota Ramirez MRN: [id number] Date of Birth: 06-16-1971  Transition of Care [facility]) CM/SW Contact:  Arlana JINNY Nicholaus [name] Phone Number: [phone] 09/20/2023, 11:32 AM   Clinical Narrative:  HF CSW called to schedule pts Hospital follow up appointment scheduled for Friday, September 27, 2023 at 9:45 AM.   Pt drove to the hospital and plans to drive himself home at dc.             Patient Goals and CMS Choice Patient states their goals for this hospitalization and ongoing recovery are:: Return to home          Discharge Placement                       Discharge Plan and Services Additional resources added to the After Visit Summary for     Discharge Planning Services: CM Consult                      HH Arranged: NA          Social Drivers of Health (SDOH) Interventions SDOH Screenings   Food Insecurity: Food Insecurity Present (09/16/2023)  Housing: Low Risk  (09/16/2023)  Transportation Needs: No Transportation Needs (09/16/2023)  Utilities: Not At Risk (09/16/2023)  Depression (PHQ2-9): Low Risk  (01/17/2023)  Recent Concern: Depression (PHQ2-9) - Medium Risk (01/10/2023)  Financial Resource Strain: High Risk (01/10/2023)  Social Connections: Unknown (12/15/2021)   Received from [facility], [facility]  Stress: No Stress Concern Present (02/28/2021)   Received from [facility], [facility]  Tobacco Use: Low Risk  (09/16/2023)     Readmission Risk Interventions    09/17/2023    2:43 PM  Readmission Risk Prevention Plan  Transportation Screening Complete  PCP or Specialist Appt within 5-7 Days Complete  Home Care Screening Complete  Medication Review (RN CM) Complete

## 2023-09-20 NOTE — Plan of Care (Signed)
?  Problem: Clinical Measurements: ?Goal: Respiratory complications will improve ?Outcome: Progressing ?Goal: Cardiovascular complication will be avoided ?Outcome: Progressing ?  ?Problem: Nutrition: ?Goal: Adequate nutrition will be maintained ?Outcome: Progressing ?  ?Problem: Safety: ?Goal: Ability to remain free from injury will improve ?Outcome: Progressing ?  ?

## 2023-09-23 ENCOUNTER — Telehealth: Payer: Self-pay

## 2023-09-23 ENCOUNTER — Telehealth: Payer: Self-pay | Admitting: *Deleted

## 2023-09-23 NOTE — Transitions of Care (Post Inpatient/ED Visit) (Signed)
   09/23/2023  Name: Dakota Ramirez MRN: [id number] DOB: [birth year]  Today's TOC FU Call Status: Today's TOC FU Call Status:: Unsuccessful Call (1st Attempt) Unsuccessful Call (1st Attempt) Date: 09/23/23  Attempted to reach the patient regarding the most recent Inpatient/ED visit. Patient was called in an Outreach attempt to offer VBCI  30-day TOC program.Pt is eligible for program due to potential risk for readmission and/or high utilization. Unfortunately, I was not able to speak with the patient in regards to recent hospital discharge   Follow Up Plan: Additional outreach attempts will be made to reach the patient to complete the Transitions of Care (Post Inpatient/ED visit) call.   Irineo Manns RN, BSN, CCM [place]  [facility] Manager Population Health Direct Dial: [phone]  Fax: [phone]

## 2023-09-23 NOTE — TOC Transition Note (Signed)
 Transition of Care [facility]) - Discharge Note   Patient Details  Name: Dakota Ramirez MRN: [id number] Date of Birth: 07-Jan-1971  Transition of Care [facility]) CM/SW Contact:  Justina Delcia Czar, RN Phone Number:  09/23/2023, 11:53 AM   Clinical Narrative:    09/20/2023 1130 am HF TOC CM spoke to pt at bedside. States he is concerned about having his foot amputated. He still lives in trailer on his father's land. States he drives to his appts. Discussed HH and was he active. States HH did come out but not wanting HH at this time.   09/23/2023 1155 am. Contacted [facility] rep, Amy to see if pt was active with [facility] and did HH RN and HH PT come to his home.    Final next level of care: Home/Self Care Barriers to Discharge: No Barriers Identified   Patient Goals and CMS Choice Patient states their goals for this hospitalization and ongoing recovery are:: Return to home CMS Medicare.gov Compare Post Acute Care list provided to:: Patient Choice offered to / list presented to : Patient      Discharge Placement                       Discharge Plan and Services Additional resources added to the After Visit Summary for     Discharge Planning Services: CM Consult Post Acute Care Choice: Home Health                    HH Arranged: Refused HH          Social Drivers of Health (SDOH) Interventions SDOH Screenings   Food Insecurity: Food Insecurity Present (09/16/2023)  Housing: Low Risk  (09/16/2023)  Transportation Needs: No Transportation Needs (09/16/2023)  Utilities: Not At Risk (09/16/2023)  Depression (PHQ2-9): Low Risk  (01/17/2023)  Recent Concern: Depression (PHQ2-9) - Medium Risk (01/10/2023)  Financial Resource Strain: High Risk (01/10/2023)  Social Connections: Unknown (12/15/2021)   Received from [facility]  Stress: No Stress Concern Present (02/28/2021)   Received from [facility]  Tobacco Use: Low Risk  (09/16/2023)     Readmission Risk  Interventions    09/17/2023    2:43 PM  Readmission Risk Prevention Plan  Transportation Screening Complete  PCP or Specialist Appt within 5-7 Days Complete  Home Care Screening Complete  Medication Review (RN CM) Complete

## 2023-09-23 NOTE — Patient Outreach (Signed)
  Care Management   Note  09/23/2023 Name: Dakota Ramirez MRN: [id number] DOB: Nov 05, 1970  [name] is enrolled in a Managed Medicaid plan: Yes. Outreach attempt today was successful.   Telephone outreach to [name] to reschedule initial outreach that was scheduled during patients recent hospitalization. [name] [place]. He agreed to schedule an upcoming telephone outreach.   An initial telephone outreach has been scheduled for: 09/25/23 at 3:15pm. [name] agreed to new date and time.  Arna Better RN, BSN [place]  [facility] Health RN Care Manager [phone]

## 2023-09-25 ENCOUNTER — Other Ambulatory Visit: Payer: Self-pay | Admitting: *Deleted

## 2023-09-25 NOTE — Progress Notes (Incomplete)
ADVANCED HF CLINIC NOTE   Primary Care: [facility]. HF Cardiologist: Dr. Gala Romney  HPI: Dakota Ramirez is a 53 y.o. 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    Admitted 4/23 with new-onset acute HF.  Given IV lasix. Echo EF 20-25%, regional WMA, moderately dilated LV, RV moderately reduced, RVSP 40 mmHg, mild to moderate MR, dilated IVC with estimated RAP 15 mmHg. Cards consulted and GDMT started. 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  Overall diuresed 50 lbs. No SGLT2i with uncontrolled DM. Discharged home, weight 259 lbs.   cMRI 4/24 showed severe biventricular failure, LVEF 17%. RVEF 22% LGE imaging very poor quality, which limits interpretation. 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   Follow up 12/26/22, NYHA IIIb, markedly volume overload weight up 40 lbs, ReDs 42%. Advised to go to ED for evaluation, where he was admitted. Diuresed with IV lasix. Echo showed EF <20%, LV with GHK, RV mildly reduced, LA/RA sev dilated, mild-mod MR, mild TR, trivial pericardial effusion. GDMT titrated. Dr Lajoyce Corners consulted for chronic osteomyelitis and Unna boot placed on RLE, no indication for surgical intervention.  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  Admitted 06/25/23 with mixed cardiogenic and septic shock secondary to LE cellulitis/wounds w/ ? osteomyelitis. He required pressor support with norepinephrine. UDS + for amphetamines. Advanced heart failure consulted. Diuresed with IV lasix and GDMT slowly added back. Echo with EF 20-25%, RV mildly reduced, new mobile density on posterior mitral valve leaflet. Subsequently had TEE which did not show any evidence of vegetation. ID consulted. 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 Additional imaging to  foot wounds also suggested. He refused to stay for additional antibiotics and treatment. Not a candidate for home antibiotics and PICC line d/t substance abuse.   Post hospital follow up 11/24, NYHA IIIb and volume overloaded. Concern for CGS. Diuretics increased with close follow up. He was unwilling to be re-admitted.  Admitted for HF/shock and massive overload from 12/17 - 08/04/23. Diuresed 60 pounds. D/c weight 253 pounds. Also treated for osteomyelitis Linezolid 600 BID x 6 weeks and Cefadroxil 500 BID x 6 weeks  Seen in AHF 1/30 and 2/3. He was 30lbs overloaded on the 20th. Diuretics increased and given metolazone. Up additional 2lbs at f/u on 2/3. Agreed to admission. Diuresed 60lbs with lasix gtt + metolazone. Ortho was consulted d/t concerns for osteomyelitis, amputation was recommended. Patient wanted to go home and think about it.   Today he returns for post hospital follow up. Overall feeling ***. Denies palpitations, CP, dizziness, edema, or PND/Orthopnea. *** SOB. Appetite ok. No fever or chills. Weight at home *** pounds. Taking all medications. Denies ETOH, tobacco or drug use.    Cardiac Studies: - TEE (11/24): no evidence of vegetation. - Echo (11/24): EF 20-25%, RV mildly reduced, new mobile density on posterior mitral valve leaflet. - Echo (5/24): EF < 20%, LV with GHK, RV mildly reduced, LA/RA sev dilated, mild-mod MR, mild TR, trivial pericardial effusion.  - Bedside echo (9/23): EF 20-25% - Echo (4/23): EF 20-25%, regional WMA, moderately dilated LV, RV moderately reduced, RVSP 40 mmHg, mild to moderate MR, dilated IVC with estimated RAP 15 mmHg. - R/LHC (4/23):    The left ventricular ejection fraction is less than 25% by visual estimate.  Ao =  97/67 (85) LV = 105/25 RA = 8 RV = 37/14 PA = 35/22 (25) PCW = 20 Fick cardiac output/index = 5.9/2.4 PVR = 0.8 WU SVR = 1040 Ao sat = 97% PA sat = 66%, 67%   1. Normal coronary arteries 2. 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  Mildly elevated filling pressures with normal cardiac output  -cMRI (4/24): poor quality, LVEF 17%, RVEF 22%,  appears to have RV insertion site LGE (which is a nonspecific scar finding often seen in setting of elevated pulmonary pressures) and basal septal midwall LGE (which is a scar pattern seen in NICM and associated with worse prognosis)   Past Medical History:  Diagnosis Date   CHF (congestive heart failure) (HCC)    Diabetes mellitus without complication (HCC)    Type II   Heart failure with reduced ejection fraction (HCC) 11/26/2021   Hypertension    Sepsis with encephalopathy and septic shock (HCC) 06/26/2023   Shock (HCC) 06/26/2023   Current Outpatient Medications  Medication Sig Dispense Refill   digoxin (LANOXIN) 0.125 MG tablet Take 1 tablet (0.125 mg total) by mouth daily. 90 tablet 3   magnesium oxide (MAGOX 400) 400 (240 Mg) MG tablet Take 1 tablet (400 mg total) by mouth daily at 12 noon. 90 tablet 3   metolazone (ZAROXOLYN) 5 MG tablet Take 1 tablet (5 mg total) by mouth daily as needed (as instructed by HF clinic for extra volume). Take an extra 2 potassium tablets with your Metolazone     NOVOLOG MIX 70/30 FLEXPEN (70-30) 100 UNIT/ML FlexPen Inject 30 Units into the skin 2 (two) times daily with a meal. 15 mL 3   potassium chloride SA (KLOR-CON M) 20 MEQ tablet Take 2 tablets (40 mEq total) by mouth daily. Take extra 40 meq on day you take metolazone. Take extra 40 mg on days you use Furoxcix. 120 tablet 5   sacubitril-valsartan (ENTRESTO) 24-26 MG Take 1 tablet by mouth 2 (two) times daily. 180 tablet 3   spironolactone (ALDACTONE) 25 MG tablet Take 1 tablet (25 mg total) by mouth daily. 90 tablet 3   torsemide (DEMADEX) 20 MG tablet Take 4 tablets (80 mg total) by mouth 2 (two) times daily. 120 tablet 3   No current facility-administered medications for this visit.   No Known Allergies  Social History   Socioeconomic History   Marital status: Single    Spouse  name: Not on file   Number of children: Not on file   Years of education: Not on file   Highest education level: Not on file  Occupational History   Not on file  Tobacco Use   Smoking status: Never   Smokeless tobacco: Never  Vaping Use   Vaping status: Never Used  Substance and Sexual Activity   Alcohol use: Not Currently    Comment: rare   Drug use: Never   Sexual activity: Not on file  Other Topics Concern   Not on file  Social History Narrative   Not on file   Social Drivers of Health   Financial Resource Strain: High Risk (01/10/2023)   Overall Financial Resource Strain (CARDIA)    Difficulty of Paying Living Expenses: Hard  Food Insecurity: Food Insecurity Present (09/16/2023)   Hunger Vital Sign    Worried About Running Out of Food in the Last Year: Never true    Ran Out of Food in the Last Year: Sometimes true  Transportation Needs: No Transportation Needs (09/16/2023)   PRAPARE - Transportation  Lack of Transportation (Medical): No    Lack of Transportation (Non-Medical): No  Physical Activity: Not on file  Stress: No Stress Concern Present (02/28/2021)   Received from [facility] - Occupational Stress Questionnaire    Feeling of Stress : Not at all  Social Connections: Unknown (12/15/2021)   Received from [facility]   Social Network    Social Network: Not on file  Intimate Partner Violence: Not At Risk (09/16/2023)   Humiliation, Afraid, Rape, and Kick questionnaire    Fear of Current or Ex-Partner: No    Emotionally Abused: No    Physically Abused: No    Sexually Abused: No   Family History  Problem Relation Age of Onset   Diabetes Mother    Hypertension Mother    There were no vitals taken for this visit.  Wt Readings from Last 3 Encounters:  09/20/23 108.7 kg (239 lb 11.2 oz)  09/16/23 136.1 kg (300 lb)  09/12/23 135.5 kg (298 lb 12.8 oz)   PHYSICAL EXAM: General:  ***  appearing.  No respiratory difficulty HEENT: normal Neck: supple. JVD *** cm. Carotids 2+ bilat; no bruits. No lymphadenopathy or thyromegaly appreciated. Cor: PMI nondisplaced. Regular rate & rhythm. No rubs, gallops or murmurs. Lungs: clear Abdomen: soft, nontender, nondistended. No hepatosplenomegaly. No bruits or masses. Good bowel sounds. Extremities: no cyanosis, clubbing, rash, edema  Neuro: alert & oriented x 3, cranial nerves grossly intact. moves all 4 extremities w/o difficulty. Affect pleasant.   Assessment/Plan:  Acute on Chronic Systolic Heart Failure - NICM. Reports hx CHF in his mother. No hx alcohol or drug abuse. 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 RVEF 22% LGE imaging very poor quality, which limits interpretation. Appears to have RV insertion site LGE (associated with worse prognosis) - Echo (5/24): EF < 20%, LV with GHK, RV mildly reduced, LA/RA sev dilated, mild-mod MR, mild TR, trivial pericardial effusion.  - Echo 11/24: EF 20-25%, RV mildly reduced, mild MR - Recent admit 12/24 with 60 pound diuresis - He is end-stage now with recurrent massive volume overload and he has again failed outpatient management with high-dose oral diuretics - I suspect non-compliance is contributing - Continue Torsemide 40 mg BID - Continue spiro 25 mg daily - Continue entresto 24-26 mg BID - Continue metolazone PRN - Continue digoxin 0.125 mg daily - Not a candidate for advanced therapies given chronic wounds and social situation - I offered Palliative services to him but he is not interested in having people come to his house   2. Chronic foot wounds - Treated for cellulitis/osteomyelitis  recent admit - Now on home abx - foot wounds are wrapped and improved   3. Uncontrolled DM2 - Most recent A1C 10.3 - On insulin - will cover with SSI as inpatient   4. Hx osteomyelitis s/p right TMA - 2/2 poorly controlled DM - abx complete - reassess as inpatient   5. SDOH  - Now has medicaid - Lives in a camper behind father's house. Unemployed and has limited social support. - Declined Paramedicine and Palliative support. I asked him again today ***  Follow up ***  Alen Bleacher, NP  11:32 AM

## 2023-09-25 NOTE — Patient Outreach (Signed)
  Medicaid Managed Care   Unsuccessful Attempt Note   09/25/2023 Name: Dakota Ramirez MRN: [id number] DOB: Dec 12, 1970  Referred by: Annett Fabian, MD Reason for referral : High Risk Managed Medicaid (Unsuccessful RNCM initial telephone outreach)   An unsuccessful telephone outreach was attempted today. The patient was referred to the case management team for assistance with care management and care coordination.    Follow Up Plan: The Managed Medicaid care management team will reach out to the patient again over the next 7 days.    Estanislado Emms RN, BSN [place]  [facility] Health RN Care Manager [phone]

## 2023-09-25 NOTE — Patient Instructions (Signed)
Visit Information  Mr. Dakota Ramirez  - as a part of your Medicaid benefit, you are eligible for care management and care coordination services at no cost or copay. I was unable to reach you by phone today but would be happy to help you with your health related needs. Please feel free to call me @ [phone].   A member of the Managed Medicaid care management team will reach out to you again over the next 7 days.   Estanislado Emms RN, BSN [place]  [facility] Institute [facility] Health RN Care Manager [phone]

## 2023-09-27 ENCOUNTER — Encounter: Payer: 59 | Admitting: Student

## 2023-09-28 NOTE — Addendum Note (Signed)
 Encounter addended by: Dolores Patty, MD on: 09/28/2023 10:15 PM  Actions taken: Clinical Note Signed, Level of Service modified, Visit diagnoses modified

## 2023-09-30 ENCOUNTER — Telehealth: Payer: Self-pay

## 2023-09-30 NOTE — Transitions of Care (Post Inpatient/ED Visit) (Signed)
   09/30/2023  Name: Dakota Ramirez MRN: [id number] DOB: [birth year]  Today's TOC FU Call Status: Today's TOC FU Call Status:: Unsuccessful Call (2nd Attempt) Unsuccessful Call (2nd Attempt) Date: 09/30/23  Attempted to reach the patient regarding the most recent Inpatient/ED visit.  Follow Up Plan: Additional outreach attempts will be made to reach the patient to complete the Transitions of Care (Post Inpatient/ED visit) call.   Jodelle Gross RN, BSN, CCM [place]  [facility] Institute Manager Population Health Direct Dial: [phone]  Fax: [phone]

## 2023-10-01 ENCOUNTER — Other Ambulatory Visit: Payer: Self-pay | Admitting: *Deleted

## 2023-10-01 ENCOUNTER — Telehealth: Payer: Self-pay

## 2023-10-01 NOTE — Patient Instructions (Signed)
 Visit Information  Dakota Ramirez was given information about Medicaid Managed Care team care coordination services as a part of their [facility] Medicaid benefit. Dakota Ramirez verbally consented to engagement with the [facility] Managed Care team.   If you are experiencing a medical emergency, please call 911 or report to your local emergency department or urgent care.   If you have a non-emergency medical problem during routine business hours, please contact your provider's office and ask to speak with a nurse.   For questions related to your [facility] health plan, please call: [phone] or go here:[email or web address]  If you would like to schedule transportation through your [facility] plan, please call the following number at least 2 days in advance of your appointment: [phone].   You can also use the MTM portal or MTM mobile app to manage your rides. Reimbursement for transportation is available through [facility]! For the portal, please go to [email or web address]  Call the [facility] Crisis Line at 7165004028, at any time, 24 hours a day, 7 days a week. If you are in danger or need immediate medical attention call 911.  If you would like help to quit smoking, call 1-800-QUIT-NOW ((509)051-3247) OR Espaol: 1-855-Djelo-Ya (5-956-387-5643) o para ms informacin haga clic aqu or Text READY to 329-518 to register via text  Dakota Ramirez,   Please see education materials related to HF provided by MyChart link. and as Financial risk analyst.   The patient verbalized understanding of instructions, educational materials, and care plan provided today and agreed to receive a mailed copy of patient instructions, educational materials, and care plan.   Telephone follow up appointment with Managed Medicaid care management team member scheduled for:10/10/23 at 3:15pm  Estanislado Emms RN, BSN [place]  Value[facility]Based Care Institute [facility] Health RN Care  Manager [phone]   Following is a copy of your plan of care:  Care Plan : RN Care Manager Plan of Care  Updates made by Heidi Dach, RN since 10/01/2023 12:00 AM     Problem: Health Management needs related to CHF      Long-Range Goal: Development of Plan of Care to address Health Management needs related to CHF   Start Date: 10/01/2023  Expected End Date: 12/30/2023  Note:   Current Barriers:  Chronic Disease Management support and education needs related to CHF   RNCM Clinical Goal(s):  Patient will verbalize understanding of plan for management of CHF as evidenced by Patient reports take all medications exactly as prescribed and will call provider for medication related questions as evidenced by Patient reports attend all scheduled medical appointments: 10/02/23 with HF Clinic as evidenced by Provider documentation in EMR continue to work with RN Care Manager to address care management and care coordination needs related to  CHF as evidenced by adherence to CM Team Scheduled appointments through collaboration with RN Care manager, provider, and care team.   Interventions: Evaluation of current treatment plan related to  self management and patient's adherence to plan as established by provider   Heart Failure Interventions:  (Status:  New goal.) Long Term Goal Basic overview and discussion of pathophysiology of Heart Failure reviewed Provided education on low sodium diet Reviewed role of diuretics in prevention of fluid overload and management of heart failure; Discussed the importance of keeping all appointments with provider Assessed social determinant of health barriers  Assisted with contacting Dakota Ramirez in [place], requesting transfer of medications, compliance packaging and free delivery Advised patient to contact Dakota Ramirez [phone] with any  medication needs or changes Discussed follow up appointment with HF Clinic on 10/02/23, advised patient to take  all medications to this visit Collaborated with Internal Medicine, requesting assistance with rescheduling missed appointment  Patient Goals/Self-Care Activities: Take all medications as prescribed Attend all scheduled provider appointments Call Ramirez for medication refills 3-7 days in advance of running out of medications Call provider office for new concerns or questions   Follow Up Plan:  Telephone follow up appointment with care management team member scheduled for:  10/10/23 at 3:15pm

## 2023-10-01 NOTE — Patient Outreach (Signed)
 Medicaid Managed Care   Nurse Care Manager Note  10/01/2023 Name:  Dakota Ramirez MRN:  [id number] DOB:  12-Mar-1971  Dakota Ramirez is an 53 y.o. year old male who is a primary patient of Annett Fabian, MD.  [facility] Managed Care Coordination team was consulted for assistance with:    CHF  Mr. Hicks was given information about Medicaid Managed Care Coordination team services today. Dakota Ramirez Patient agreed to services and verbal consent obtained.  Engaged with patient by telephone for follow up visit in response to provider referral for case management and/or care coordination services.   Patient is participating in a Managed Medicaid Plan:  Yes  Assessments/Interventions:  Review of past medical history, allergies, medications, health status, including review of consultants reports, laboratory and other test data, was performed as part of comprehensive evaluation and provision of chronic care management services.  SDOH (Social Drivers of Health) assessments and interventions performed: SDOH Interventions    Flowsheet Row Patient Outreach Telephone from 09/13/2023 in [place] [facility] POP[facility]PART[facility] from 01/10/2023 in [facility] ED to Hosp-Admission (Discharged) from 12/26/2022 in [place] 2C CV PROGRESSIVE CARE ED to Hosp-Admission (Discharged) from 11/26/2021 in [facility] 3E HF PCU  SDOH Interventions      Food Insecurity Interventions -- Assist with SNAP Application Intervention Not Indicated Intervention Not Indicated  Housing Interventions Intervention Not Indicated -- Intervention Not Indicated Intervention Not Indicated  Transportation Interventions Intervention Not Indicated -- -- --  Utilities Interventions -- -- Intervention Not Indicated --  Financial Strain Interventions -- Other (Comment)  [[facility] referral for disability] -- --       Care Plan  No Known  Allergies  Medications Reviewed Today     Reviewed by Heidi Dach, RN (Registered Nurse) on 10/01/23 at 1133  Med List Status: <None>   Medication Order Taking? Sig Documenting Provider Last Dose Status Informant  digoxin (LANOXIN) 0.125 MG tablet [id number] Yes Take 1 tablet (0.125 mg total) by mouth daily. Bensimhon, Bevelyn Buckles, MD Taking Active Self, Pharmacy Records  FUROSCIX 80 MG/10ML CTKT [id number] No Inject into the skin.  Patient not taking: Reported on 10/01/2023   [provider] Not Taking Active   magnesium oxide (MAGOX 400) 400 (240 Mg) MG tablet [id number] Yes Take 1 tablet (400 mg total) by mouth daily at 12 noon. Romie Minus, MD Taking Active   metolazone (ZAROXOLYN) 5 MG tablet [id number] No Take 1 tablet (5 mg total) by mouth daily as needed (as instructed by HF clinic for extra volume). Take an extra 2 potassium tablets with your Metolazone  Patient not taking: Reported on 10/01/2023   Romie Minus, MD Not Taking Active            Med Note (Aurelie Dicenzo A   Tue Oct 01, 2023 11:19 AM) Has not taken since recent admission  NOVOLOG MIX 70/30 FLEXPEN (70-30) 100 UNIT/ML FlexPen [id number] Yes Inject 30 Units into the skin 2 (two) times daily with a meal. Rocky Morel, DO Taking Active Self, Pharmacy Records  potassium chloride SA (KLOR-CON M) 20 MEQ tablet [id number] Yes Take 2 tablets (40 mEq total) by mouth daily. Take extra 40 meq on day you take metolazone. Take extra 40 mg on days you use Furoxcix. Katheran James, DO Taking Active Self, Pharmacy Records  sacubitril-valsartan [facility]) 24-26 [place] [id number] Yes Take 1 tablet by mouth 2 (two) times daily. Juberg,  Cristal Deer, DO Taking Active Self, Pharmacy Records  spironolactone (ALDACTONE) 25 MG tablet [id number] Yes Take 1 tablet (25 mg total) by mouth daily. Katheran James, DO Taking Active Self, Pharmacy Records  torsemide (DEMADEX) 20 MG tablet [id number] Yes Take 4 tablets (80 mg total) by  mouth 2 (two) times daily. Romie Minus, MD Taking Active             Patient Active Problem List   Diagnosis Date Noted   Osteomyelitis of ankle and foot (HCC) 09/20/2023   Acute on chronic systolic CHF (congestive heart failure) (HCC) 09/16/2023   Acute on chronic heart failure (HCC) 07/30/2023   Non-ischemic cardiomyopathy (HCC) 07/01/2023   Congestive hepatopathy 06/30/2023   Bacteremia 06/30/2023   Mitral valve vegetation 06/30/2023   Hypoalbuminemia due to protein-calorie malnutrition (HCC) 06/30/2023   Non-adherence to medical treatment 06/30/2023   AKI (acute kidney injury) (HCC) 06/26/2023   Hyperkalemia 06/26/2023   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 01/10/2023   Diabetic osteomyelitis (HCC) 12/28/2022   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 to breakdown of skin (HCC) 12/28/2022   Severe protein-calorie malnutrition (HCC) 12/28/2022   Acute on chronic systolic heart failure (HCC) 12/28/2022   Elevated alkaline phosphatase level 12/27/2022   Acute on chronic congestive heart failure (HCC) 12/26/2022   Hyponatremia 11/29/2021   PVD (peripheral vascular disease) (HCC) 11/29/2021   Class 2 obesity 11/29/2021   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) 11/26/2021   Diabetic foot ulcer with osteomyelitis (HCC) 11/26/2021   Essential hypertension 11/26/2021    Conditions to be addressed/monitored per PCP order:  CHF  Care Plan : RN Care Manager Plan of Care  Updates made by Heidi Dach, RN since 10/01/2023 12:00 AM     Problem: Health Management needs related to CHF      Long-Range Goal: Development of Plan of Care to address Health Management needs related to CHF   Start Date: 10/01/2023  Expected End Date: 12/30/2023  Note:   Current Barriers:  Chronic Disease Management support and education needs related to CHF   RNCM Clinical Goal(s):  Patient will verbalize  understanding of plan for management of CHF as evidenced by Patient reports take all medications exactly as prescribed and will call provider for medication related questions as evidenced by Patient reports attend all scheduled medical appointments: 10/02/23 with HF Clinic as evidenced by Provider documentation in EMR continue to work with RN Care Manager to address care management and care coordination needs related to  CHF as evidenced by adherence to CM Team Scheduled appointments through collaboration with RN Care manager, provider, and care team.   Interventions: Evaluation of current treatment plan related to  self management and patient's adherence to plan as established by provider   Heart Failure Interventions:  (Status:  New goal.) Long Term Goal Basic overview and discussion of pathophysiology of Heart Failure reviewed Provided education on low sodium diet Reviewed role of diuretics in prevention of fluid overload and management of heart failure; Discussed the importance of keeping all appointments with provider Assessed social determinant of health barriers  Assisted with contacting [facility] in [place], requesting transfer of medications, compliance packaging and free delivery Advised patient to contact [facility] [phone] with any medication needs or changes Discussed follow up appointment with HF Clinic on 10/02/23, advised patient to take all medications to this visit Collaborated with Internal Medicine, requesting assistance with rescheduling missed appointment  Patient Goals/Self-Care Activities: Take all medications as prescribed Attend all scheduled provider appointments Call pharmacy for medication refills 3-7 days in advance of running out of medications Call provider office for new concerns or questions   Follow Up Plan:  Telephone follow up appointment with care management team member scheduled for:  10/10/23 at 3:15pm      Follow Up:  Patient agrees  to Care Plan and Follow-up.  Plan: The Managed Medicaid care management team will reach out to the patient again over the next 7 days.  Date/time of next scheduled RN care management/care coordination outreach:  10/10/23 at 3:15pm  Estanislado Emms RN, BSN [place]  [facility] RN Care Manager [phone]

## 2023-10-01 NOTE — Transitions of Care (Post Inpatient/ED Visit) (Signed)
   10/01/2023  Name: Dakota Ramirez MRN: [id number] DOB: 23-Nov-1970  Today's TOC FU Call Status: Today's TOC FU Call Status:: Unsuccessful Call (3rd Attempt) Unsuccessful Call (3rd Attempt) Date: 10/01/23  Attempted to reach the patient regarding the most recent Inpatient/ED visit.  Follow Up Plan: No further outreach attempts will be made at this time. We have been unable to contact the patient.  Hilbert Odor RN, CCM [place]  VBCI-Population Health RN Care Manager [phone]

## 2023-10-02 ENCOUNTER — Encounter (HOSPITAL_COMMUNITY): Payer: 59

## 2023-10-03 ENCOUNTER — Telehealth: Payer: Self-pay | Admitting: *Deleted

## 2023-10-03 NOTE — Telephone Encounter (Signed)
 Patient was identified as falling into the [place] Measure - Diabetes.   Patient was: Appointment scheduled with primary care provider in the next 30 days.

## 2023-10-08 ENCOUNTER — Inpatient Hospital Stay (HOSPITAL_COMMUNITY)
Admission: EM | Admit: 2023-10-08 | Discharge: 2023-10-13 | DRG: 291 | Disposition: A | Discharge status: Hospice | Payer: 59 | Attending: Internal Medicine | Admitting: Internal Medicine

## 2023-10-08 ENCOUNTER — Other Ambulatory Visit: Payer: Self-pay

## 2023-10-08 ENCOUNTER — Emergency Department (HOSPITAL_COMMUNITY): Payer: 59

## 2023-10-08 ENCOUNTER — Encounter (HOSPITAL_COMMUNITY): Payer: Self-pay

## 2023-10-08 ENCOUNTER — Ambulatory Visit (INDEPENDENT_AMBULATORY_CARE_PROVIDER_SITE_OTHER): Payer: 59 | Admitting: Student

## 2023-10-08 VITALS — BP 110/85 | HR 114 | Temp 98.3°F

## 2023-10-08 DIAGNOSIS — G8929 Other chronic pain: Secondary | ICD-10-CM | POA: Diagnosis present

## 2023-10-08 DIAGNOSIS — E1169 Type 2 diabetes mellitus with other specified complication: Secondary | ICD-10-CM | POA: Diagnosis present

## 2023-10-08 DIAGNOSIS — Z794 Long term (current) use of insulin: Secondary | ICD-10-CM

## 2023-10-08 DIAGNOSIS — Z515 Encounter for palliative care: Secondary | ICD-10-CM

## 2023-10-08 DIAGNOSIS — J09X2 Influenza due to identified novel influenza A virus with other respiratory manifestations: Secondary | ICD-10-CM

## 2023-10-08 DIAGNOSIS — E1151 Type 2 diabetes mellitus with diabetic peripheral angiopathy without gangrene: Secondary | ICD-10-CM | POA: Diagnosis present

## 2023-10-08 DIAGNOSIS — B957 Other staphylococcus as the cause of diseases classified elsewhere: Secondary | ICD-10-CM | POA: Diagnosis present

## 2023-10-08 DIAGNOSIS — E872 Acidosis, unspecified: Secondary | ICD-10-CM | POA: Diagnosis present

## 2023-10-08 DIAGNOSIS — G9341 Metabolic encephalopathy: Secondary | ICD-10-CM | POA: Diagnosis not present

## 2023-10-08 DIAGNOSIS — F1210 Cannabis abuse, uncomplicated: Secondary | ICD-10-CM | POA: Diagnosis present

## 2023-10-08 DIAGNOSIS — M86279 Subacute osteomyelitis, unspecified ankle and foot: Secondary | ICD-10-CM | POA: Diagnosis not present

## 2023-10-08 DIAGNOSIS — E1165 Type 2 diabetes mellitus with hyperglycemia: Secondary | ICD-10-CM | POA: Diagnosis present

## 2023-10-08 DIAGNOSIS — J111 Influenza due to unidentified influenza virus with other respiratory manifestations: Secondary | ICD-10-CM

## 2023-10-08 DIAGNOSIS — I5020 Unspecified systolic (congestive) heart failure: Secondary | ICD-10-CM | POA: Diagnosis not present

## 2023-10-08 DIAGNOSIS — Z5986 Financial insecurity: Secondary | ICD-10-CM

## 2023-10-08 DIAGNOSIS — F1510 Other stimulant abuse, uncomplicated: Secondary | ICD-10-CM | POA: Diagnosis present

## 2023-10-08 DIAGNOSIS — M86172 Other acute osteomyelitis, left ankle and foot: Secondary | ICD-10-CM | POA: Diagnosis present

## 2023-10-08 DIAGNOSIS — J9602 Acute respiratory failure with hypercapnia: Secondary | ICD-10-CM | POA: Diagnosis present

## 2023-10-08 DIAGNOSIS — E871 Hypo-osmolality and hyponatremia: Secondary | ICD-10-CM | POA: Diagnosis present

## 2023-10-08 DIAGNOSIS — G0490 Encephalitis and encephalomyelitis, unspecified: Secondary | ICD-10-CM | POA: Diagnosis present

## 2023-10-08 DIAGNOSIS — J1000 Influenza due to other identified influenza virus with unspecified type of pneumonia: Secondary | ICD-10-CM | POA: Diagnosis present

## 2023-10-08 DIAGNOSIS — G928 Other toxic encephalopathy: Secondary | ICD-10-CM | POA: Diagnosis present

## 2023-10-08 DIAGNOSIS — L97519 Non-pressure chronic ulcer of other part of right foot with unspecified severity: Secondary | ICD-10-CM | POA: Diagnosis present

## 2023-10-08 DIAGNOSIS — R0902 Hypoxemia: Secondary | ICD-10-CM | POA: Diagnosis not present

## 2023-10-08 DIAGNOSIS — E1152 Type 2 diabetes mellitus with diabetic peripheral angiopathy with gangrene: Secondary | ICD-10-CM | POA: Diagnosis not present

## 2023-10-08 DIAGNOSIS — E119 Type 2 diabetes mellitus without complications: Secondary | ICD-10-CM

## 2023-10-08 DIAGNOSIS — L97509 Non-pressure chronic ulcer of other part of unspecified foot with unspecified severity: Secondary | ICD-10-CM

## 2023-10-08 DIAGNOSIS — E11621 Type 2 diabetes mellitus with foot ulcer: Secondary | ICD-10-CM | POA: Diagnosis present

## 2023-10-08 DIAGNOSIS — I5023 Acute on chronic systolic (congestive) heart failure: Secondary | ICD-10-CM | POA: Diagnosis present

## 2023-10-08 DIAGNOSIS — J9601 Acute respiratory failure with hypoxia: Secondary | ICD-10-CM | POA: Diagnosis present

## 2023-10-08 DIAGNOSIS — I429 Cardiomyopathy, unspecified: Secondary | ICD-10-CM

## 2023-10-08 DIAGNOSIS — R570 Cardiogenic shock: Secondary | ICD-10-CM | POA: Diagnosis not present

## 2023-10-08 DIAGNOSIS — M86171 Other acute osteomyelitis, right ankle and foot: Secondary | ICD-10-CM | POA: Diagnosis present

## 2023-10-08 DIAGNOSIS — E876 Hypokalemia: Secondary | ICD-10-CM | POA: Diagnosis not present

## 2023-10-08 DIAGNOSIS — E11622 Type 2 diabetes mellitus with other skin ulcer: Secondary | ICD-10-CM | POA: Diagnosis present

## 2023-10-08 DIAGNOSIS — I509 Heart failure, unspecified: Secondary | ICD-10-CM | POA: Diagnosis present

## 2023-10-08 DIAGNOSIS — Z833 Family history of diabetes mellitus: Secondary | ICD-10-CM

## 2023-10-08 DIAGNOSIS — I110 Hypertensive heart disease with heart failure: Principal | ICD-10-CM | POA: Diagnosis present

## 2023-10-08 DIAGNOSIS — F419 Anxiety disorder, unspecified: Secondary | ICD-10-CM | POA: Diagnosis present

## 2023-10-08 DIAGNOSIS — L97529 Non-pressure chronic ulcer of other part of left foot with unspecified severity: Secondary | ICD-10-CM | POA: Diagnosis present

## 2023-10-08 DIAGNOSIS — Z66 Do not resuscitate: Secondary | ICD-10-CM | POA: Diagnosis present

## 2023-10-08 DIAGNOSIS — Z8249 Family history of ischemic heart disease and other diseases of the circulatory system: Secondary | ICD-10-CM

## 2023-10-08 DIAGNOSIS — I428 Other cardiomyopathies: Secondary | ICD-10-CM | POA: Diagnosis present

## 2023-10-08 DIAGNOSIS — Z608 Other problems related to social environment: Secondary | ICD-10-CM | POA: Diagnosis present

## 2023-10-08 DIAGNOSIS — M86671 Other chronic osteomyelitis, right ankle and foot: Secondary | ICD-10-CM | POA: Diagnosis present

## 2023-10-08 DIAGNOSIS — Z7189 Other specified counseling: Secondary | ICD-10-CM | POA: Diagnosis not present

## 2023-10-08 DIAGNOSIS — I5084 End stage heart failure: Secondary | ICD-10-CM | POA: Diagnosis present

## 2023-10-08 DIAGNOSIS — L03115 Cellulitis of right lower limb: Secondary | ICD-10-CM | POA: Diagnosis present

## 2023-10-08 DIAGNOSIS — I5082 Biventricular heart failure: Secondary | ICD-10-CM | POA: Diagnosis not present

## 2023-10-08 DIAGNOSIS — J810 Acute pulmonary edema: Secondary | ICD-10-CM

## 2023-10-08 DIAGNOSIS — M86672 Other chronic osteomyelitis, left ankle and foot: Secondary | ICD-10-CM | POA: Diagnosis present

## 2023-10-08 DIAGNOSIS — Z79899 Other long term (current) drug therapy: Secondary | ICD-10-CM

## 2023-10-08 DIAGNOSIS — Z89412 Acquired absence of left great toe: Secondary | ICD-10-CM

## 2023-10-08 LAB — POCT I-STAT 7, (LYTES, BLD GAS, ICA,H+H)
Acid-base deficit: 2 mmol/L (ref 0.0–2.0)
Bicarbonate: 25.8 mmol/L (ref 20.0–28.0)
Calcium, Ion: 1.23 mmol/L (ref 1.15–1.40)
HCT: 40 % (ref 39.0–52.0)
Hemoglobin: 13.6 g/dL (ref 13.0–17.0)
O2 Saturation: 98 %
Patient temperature: 96.9
Potassium: 3.5 mmol/L (ref 3.5–5.1)
Sodium: 136 mmol/L (ref 135–145)
TCO2: 27 mmol/L (ref 22–32)
pCO2 arterial: 52 mmHg — ABNORMAL HIGH (ref 32–48)
pH, Arterial: 7.299 — ABNORMAL LOW (ref 7.35–7.45)
pO2, Arterial: 116 mmHg — ABNORMAL HIGH (ref 83–108)

## 2023-10-08 LAB — I-STAT VENOUS BLOOD GAS, ED
Acid-base deficit: 6 mmol/L — ABNORMAL HIGH (ref 0.0–2.0)
Acid-base deficit: 6 mmol/L — ABNORMAL HIGH (ref 0.0–2.0)
Bicarbonate: 23.5 mmol/L (ref 20.0–28.0)
Bicarbonate: 24.5 mmol/L (ref 20.0–28.0)
Calcium, Ion: 1.1 mmol/L — ABNORMAL LOW (ref 1.15–1.40)
Calcium, Ion: 1.09 mmol/L — ABNORMAL LOW (ref 1.15–1.40)
HCT: 45 % (ref 39.0–52.0)
HCT: 48 % (ref 39.0–52.0)
Hemoglobin: 15.3 g/dL (ref 13.0–17.0)
Hemoglobin: 16.3 g/dL (ref 13.0–17.0)
O2 Saturation: 66 %
O2 Saturation: 76 %
Potassium: 4.5 mmol/L (ref 3.5–5.1)
Potassium: 4.4 mmol/L (ref 3.5–5.1)
Sodium: 136 mmol/L (ref 135–145)
Sodium: 136 mmol/L (ref 135–145)
TCO2: 25 mmol/L (ref 22–32)
TCO2: 26 mmol/L (ref 22–32)
pCO2, Ven: 62.4 mmHg — ABNORMAL HIGH (ref 44–60)
pCO2, Ven: 66.4 mmHg — ABNORMAL HIGH (ref 44–60)
pH, Ven: 7.184 — CL (ref 7.25–7.43)
pH, Ven: 7.175 — CL (ref 7.25–7.43)
pO2, Ven: 43 mmHg (ref 32–45)
pO2, Ven: 53 mmHg — ABNORMAL HIGH (ref 32–45)

## 2023-10-08 LAB — CBC
HCT: 44 % (ref 39.0–52.0)
HCT: 45.2 % (ref 39.0–52.0)
Hemoglobin: 13.6 g/dL (ref 13.0–17.0)
Hemoglobin: 13.7 g/dL (ref 13.0–17.0)
MCH: 28.8 pg (ref 26.0–34.0)
MCH: 29.1 pg (ref 26.0–34.0)
MCHC: 30.9 g/dL (ref 30.0–36.0)
MCHC: 30.3 g/dL (ref 30.0–36.0)
MCV: 93.2 fL (ref 80.0–100.0)
MCV: 96 fL (ref 80.0–100.0)
Platelets: 246 10*3/uL (ref 150–400)
Platelets: 204 10*3/uL (ref 150–400)
RBC: 4.72 MIL/uL (ref 4.22–5.81)
RBC: 4.71 MIL/uL (ref 4.22–5.81)
RDW: 23 % — ABNORMAL HIGH (ref 11.5–15.5)
RDW: 22.6 % — ABNORMAL HIGH (ref 11.5–15.5)
WBC: 4.4 10*3/uL (ref 4.0–10.5)
WBC: 3.9 10*3/uL — ABNORMAL LOW (ref 4.0–10.5)
nRBC: 0.5 % — ABNORMAL HIGH (ref 0.0–0.2)
nRBC: 0.5 % — ABNORMAL HIGH (ref 0.0–0.2)

## 2023-10-08 LAB — DIGOXIN LEVEL: Digoxin Level: <0.2 ng/mL — ABNORMAL LOW (ref 0.8–2.0)

## 2023-10-08 LAB — HEPATIC FUNCTION PANEL
ALT: 43 U/L (ref 0–44)
AST: 60 U/L — ABNORMAL HIGH (ref 15–41)
Albumin: 2.4 g/dL — ABNORMAL LOW (ref 3.5–5.0)
Alkaline Phosphatase: 141 U/L — ABNORMAL HIGH (ref 38–126)
Bilirubin, Direct: 0.5 mg/dL — ABNORMAL HIGH (ref 0.0–0.2)
Indirect Bilirubin: 0.5 mg/dL (ref 0.3–0.9)
Total Bilirubin: 1 mg/dL (ref 0.0–1.2)
Total Protein: 8.4 g/dL — ABNORMAL HIGH (ref 6.5–8.1)

## 2023-10-08 LAB — COMPREHENSIVE METABOLIC PANEL
ALT: 46 U/L — ABNORMAL HIGH (ref 0–44)
AST: 62 U/L — ABNORMAL HIGH (ref 15–41)
Albumin: 2.5 g/dL — ABNORMAL LOW (ref 3.5–5.0)
Alkaline Phosphatase: 154 U/L — ABNORMAL HIGH (ref 38–126)
Anion gap: 15 (ref 5–15)
BUN: 36 mg/dL — ABNORMAL HIGH (ref 6–20)
CO2: 16 mmol/L — ABNORMAL LOW (ref 22–32)
Calcium: 8.8 mg/dL — ABNORMAL LOW (ref 8.9–10.3)
Chloride: 100 mmol/L (ref 98–111)
Creatinine, Ser: 1.25 mg/dL — ABNORMAL HIGH (ref 0.61–1.24)
GFR, Estimated: >60 mL/min (ref >60)
Glucose, Bld: 165 mg/dL — ABNORMAL HIGH (ref 70–99)
Potassium: 4.6 mmol/L (ref 3.5–5.1)
Sodium: 131 mmol/L — ABNORMAL LOW (ref 135–145)
Total Bilirubin: 1 mg/dL (ref 0.0–1.2)
Total Protein: 8.5 g/dL — ABNORMAL HIGH (ref 6.5–8.1)

## 2023-10-08 LAB — RESP PANEL BY RT-PCR (RSV, FLU A&B, COVID)  RVPGX2
Influenza A by PCR: POSITIVE — AB
Influenza B by PCR: NEGATIVE
Resp Syncytial Virus by PCR: NEGATIVE
SARS Coronavirus 2 by RT PCR: NEGATIVE

## 2023-10-08 LAB — I-STAT ARTERIAL BLOOD GAS, ED
Acid-base deficit: 5 mmol/L — ABNORMAL HIGH (ref 0.0–2.0)
Bicarbonate: 23.5 mmol/L (ref 20.0–28.0)
Calcium, Ion: 1.22 mmol/L (ref 1.15–1.40)
HCT: 44 % (ref 39.0–52.0)
Hemoglobin: 15 g/dL (ref 13.0–17.0)
O2 Saturation: 99 %
Patient temperature: 97.4
Potassium: 4.4 mmol/L (ref 3.5–5.1)
Sodium: 136 mmol/L (ref 135–145)
TCO2: 25 mmol/L (ref 22–32)
pCO2 arterial: 54.2 mmHg — ABNORMAL HIGH (ref 32–48)
pH, Arterial: 7.242 — ABNORMAL LOW (ref 7.35–7.45)
pO2, Arterial: 154 mmHg — ABNORMAL HIGH (ref 83–108)

## 2023-10-08 LAB — RAPID URINE DRUG SCREEN, HOSP PERFORMED
Amphetamines: POSITIVE — AB
Barbiturates: NOT DETECTED
Benzodiazepines: NOT DETECTED
Cocaine: NOT DETECTED
Opiates: NOT DETECTED
Tetrahydrocannabinol: POSITIVE — AB

## 2023-10-08 LAB — GLUCOSE, CAPILLARY
Glucose-Capillary: 167 mg/dL — ABNORMAL HIGH (ref 70–99)
Glucose-Capillary: 135 mg/dL — ABNORMAL HIGH (ref 70–99)
Glucose-Capillary: 105 mg/dL — ABNORMAL HIGH (ref 70–99)

## 2023-10-08 LAB — MAGNESIUM: Magnesium: 1.5 mg/dL — ABNORMAL LOW (ref 1.7–2.4)

## 2023-10-08 LAB — MRSA NEXT GEN BY PCR, NASAL: MRSA by PCR Next Gen: NOT DETECTED

## 2023-10-08 LAB — PROTIME-INR
INR: 1.4 — ABNORMAL HIGH (ref 0.8–1.2)
Prothrombin Time: 16.9 s — ABNORMAL HIGH (ref 11.4–15.2)

## 2023-10-08 LAB — I-STAT CG4 LACTIC ACID, ED
Lactic Acid, Venous: 4 mmol/L (ref 0.5–1.9)
Lactic Acid, Venous: 3.6 mmol/L (ref 0.5–1.9)

## 2023-10-08 LAB — TROPONIN I (HIGH SENSITIVITY)
Troponin I (High Sensitivity): 25 ng/L — ABNORMAL HIGH (ref <18)
Troponin I (High Sensitivity): 24 ng/L — ABNORMAL HIGH (ref <18)

## 2023-10-08 LAB — CBG MONITORING, ED
Glucose-Capillary: 163 mg/dL — ABNORMAL HIGH (ref 70–99)
Glucose-Capillary: 193 mg/dL — ABNORMAL HIGH (ref 70–99)

## 2023-10-08 LAB — CREATININE, SERUM
Creatinine, Ser: 1.4 mg/dL — ABNORMAL HIGH (ref 0.61–1.24)
GFR, Estimated: >60 mL/min (ref >60)

## 2023-10-08 LAB — BRAIN NATRIURETIC PEPTIDE: B Natriuretic Peptide: 2909 pg/mL — ABNORMAL HIGH (ref 0.0–100.0)

## 2023-10-08 MED ORDER — IPRATROPIUM-ALBUTEROL 0.5-2.5 (3) MG/3ML IN SOLN
3.0000 mL | RESPIRATORY_TRACT | Status: DC
  Administered 2023-10-08 – 2023-10-09 (×5): 3 mL via RESPIRATORY_TRACT
  Filled 2023-10-08 (×5): qty 3

## 2023-10-08 MED ORDER — SODIUM CHLORIDE 0.9 % IV SOLN
2.0000 g | Freq: Once | INTRAVENOUS | Status: AC
  Administered 2023-10-08: 2 g via INTRAVENOUS
  Filled 2023-10-08: qty 12.5

## 2023-10-08 MED ORDER — POTASSIUM CHLORIDE CRYS ER 20 MEQ PO TBCR
20.0000 meq | EXTENDED_RELEASE_TABLET | Freq: Once | ORAL | Status: AC
  Administered 2023-10-08: 20 meq via ORAL
  Filled 2023-10-08: qty 1

## 2023-10-08 MED ORDER — FUROSEMIDE 10 MG/ML IJ SOLN
40.0000 mg | Freq: Once | INTRAMUSCULAR | Status: DC
  Administered 2023-10-08: 40 mg via INTRAVENOUS
  Filled 2023-10-08: qty 4

## 2023-10-08 MED ORDER — CHLORHEXIDINE GLUCONATE CLOTH 2 % EX PADS
6.0000 | MEDICATED_PAD | Freq: Every day | CUTANEOUS | Status: DC
Start: 2023-10-08 — End: 2023-10-13
  Administered 2023-10-08 – 2023-10-13 (×6): 6 via TOPICAL

## 2023-10-08 MED ORDER — DIGOXIN 125 MCG PO TABS
0.1250 mg | ORAL_TABLET | Freq: Every day | ORAL | Status: DC
  Administered 2023-10-09 – 2023-10-13 (×5): 0.125 mg via ORAL
  Filled 2023-10-08 (×5): qty 1

## 2023-10-08 MED ORDER — SPIRONOLACTONE 25 MG PO TABS
25.0000 mg | ORAL_TABLET | Freq: Every day | ORAL | Status: DC
  Administered 2023-10-09 – 2023-10-11 (×3): 25 mg via ORAL
  Filled 2023-10-08 (×3): qty 1

## 2023-10-08 MED ORDER — IPRATROPIUM-ALBUTEROL 0.5-2.5 (3) MG/3ML IN SOLN
3.0000 mL | Freq: Once | RESPIRATORY_TRACT | Status: AC
  Administered 2023-10-08: 3 mL via RESPIRATORY_TRACT
  Filled 2023-10-08: qty 3

## 2023-10-08 MED ORDER — FUROSEMIDE 10 MG/ML IJ SOLN
30.0000 mg/h | INTRAVENOUS | Status: DC
  Administered 2023-10-08 – 2023-10-09 (×2): 20 mg/h via INTRAVENOUS
  Administered 2023-10-09 – 2023-10-11 (×8): 30 mg/h via INTRAVENOUS
  Filled 2023-10-08 (×11): qty 20

## 2023-10-08 MED ORDER — HYDROXYZINE HCL 10 MG PO TABS
10.0000 mg | ORAL_TABLET | Freq: Three times a day (TID) | ORAL | Status: DC | PRN
  Administered 2023-10-09: 10 mg via ORAL
  Filled 2023-10-08 (×2): qty 1

## 2023-10-08 MED ORDER — POLYETHYLENE GLYCOL 3350 17 G PO PACK
17.0000 g | PACK | Freq: Every day | ORAL | Status: DC | PRN
  Administered 2023-10-11: 17 g via ORAL
  Filled 2023-10-08: qty 1

## 2023-10-08 MED ORDER — VANCOMYCIN HCL 2000 MG/400ML IV SOLN
2000.0000 mg | Freq: Once | INTRAVENOUS | Status: AC
  Administered 2023-10-08: 2000 mg via INTRAVENOUS
  Filled 2023-10-08: qty 400

## 2023-10-08 MED ORDER — METOLAZONE 2.5 MG PO TABS
10.0000 mg | ORAL_TABLET | Freq: Two times a day (BID) | ORAL | Status: DC
  Filled 2023-10-08 (×2): qty 2

## 2023-10-08 MED ORDER — DOCUSATE SODIUM 100 MG PO CAPS
100.0000 mg | ORAL_CAPSULE | Freq: Two times a day (BID) | ORAL | Status: DC | PRN
  Administered 2023-10-11: 100 mg via ORAL
  Filled 2023-10-08: qty 1

## 2023-10-08 MED ORDER — INSULIN ASPART 100 UNIT/ML IJ SOLN
0.0000 [IU] | INTRAMUSCULAR | Status: DC
  Administered 2023-10-08 – 2023-10-09 (×2): 4 [IU] via SUBCUTANEOUS
  Administered 2023-10-09 (×3): 3 [IU] via SUBCUTANEOUS
  Administered 2023-10-10 (×2): 4 [IU] via SUBCUTANEOUS
  Administered 2023-10-11: 3 [IU] via SUBCUTANEOUS
  Administered 2023-10-11: 4 [IU] via SUBCUTANEOUS
  Administered 2023-10-11: 3 [IU] via SUBCUTANEOUS

## 2023-10-08 MED ORDER — ENOXAPARIN SODIUM 40 MG/0.4ML IJ SOSY
40.0000 mg | PREFILLED_SYRINGE | INTRAMUSCULAR | Status: DC
  Administered 2023-10-08 – 2023-10-11 (×4): 40 mg via SUBCUTANEOUS
  Filled 2023-10-08 (×4): qty 0.4

## 2023-10-08 MED ORDER — FUROSEMIDE 10 MG/ML IJ SOLN: 80.0000 mg | Freq: Once | INTRAMUSCULAR | Status: DC

## 2023-10-08 MED ORDER — FUROSEMIDE 10 MG/ML IJ SOLN: 40.0000 mg | Freq: Once | INTRAMUSCULAR | Status: DC

## 2023-10-08 MED ORDER — DIGOXIN 0.25 MG/ML IJ SOLN
0.1250 mg | Freq: Once | INTRAMUSCULAR | Status: AC
  Administered 2023-10-08: 0.125 mg via INTRAVENOUS
  Filled 2023-10-08: qty 0.5

## 2023-10-08 MED ORDER — FUROSEMIDE 10 MG/ML IJ SOLN
120.0000 mg | Freq: Once | INTRAVENOUS | Status: AC
  Administered 2023-10-08: 120 mg via INTRAVENOUS
  Filled 2023-10-08: qty 10

## 2023-10-08 NOTE — Progress Notes (Signed)
    CC: Routine Follow Up for HTN, T2DM, HF after last office visit 01/2023  HPI:  Dakota Ramirez is a 53 y.o. male with pertinent PMH of HTN, T2DM, HF who presents to the clinic for follow up. Please see assessment and plan below for further details.  Review of Systems:   Pertinent items noted in HPI and/or A&P.  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  His ejection fraction on his most recent echocardiogram in December was less than 10%.  He has failed outpatient high-dose oral diuretics several times.  Recently hospitalized for severe volume overload and heart failure exacerbation from 2/3-2/7.   He is hypoxic in the clinic today with his SpO2 initally at 92%, but downtrending and hovering around the high 70s to mid 80s.  Started him on oxygen in the clinic. We checked a glucose in the clinic which was 167. He is oriented but somnolent. States he feels short of breath.  I cannot get much history out of him, but given my exam and his history, I am worried that he is volume overloaded again causing his hypoxia.  Given his chronic diabetic foot wounds with recent history of osteomyelitis, he may also be septic.   With his acute hypoxia and mental status changes, he needs a higher level of care. He appears quite sick. I have sent him to the emergency  department for further workup and management.   Patient seen with Dr. Synthia Innocent, MD [facility] Internal Medicine Resident PGY-1 Clinic Phone: [phone] Pager: [phone]

## 2023-10-08 NOTE — H&P (Signed)
 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 Initially presented to his primary care medical clinic with chief complaint of shortness of breath, not feeling well.  Unfortunately not much further history was able to be obtained the patient was lethargic, hypoxic with pulse oximetry in the 70s to 80s.  He appeared acutely ill.  He was sent directly to the emergency room with concern about possible sepsis  Pertinent  Medical History  End-stage heart failure (EF 20 to 25%), not a candidate for advanced therapies, poorly controlled diabetes, prior right TMA osteomyelitis, hypertension, medical noncompliance Recently hospitalized January 2025 for acute on chronic heart failure aggressively diuresed over 50 pounds discharged to home with weight 259 And acute on chronic heart failure.  He was initially placed on nasal cannula but remained quite lethargic white blood cell count was normal, troponin 25 respiratory viral panel was positive for influenza A sodium was 131, bicarbonate 16 BUN 36 creatinine 1.25 up from 1.06 venous blood gas was obtained which showed a pH of 7.18, pCO2 of 62, HCO3 of 23.  He was placed on noninvasive positive pressure ventilation, antibiotics were initiated, advanced heart failure was consulted and IV diuresis was initiated as well.  Critical care was asked to admit Significant Hospital Events: Including procedures, antibiotic start and stop dates in addition to other pertinent events   2/25 admitted with acute hypoxic and hypercarbic respiratory failure, influenza A, acute systolic heart failure,  and possible Placed on NIPPV  Interim History / Subjective:  Lethargic, will follow commands  Objective   Blood pressure (!) 128/95, pulse (!) 122, temperature (!) 97.4 F (36.3 C), temperature source Axillary, resp. rate 19, SpO2 100%.    Vent Mode: PSV;BIPAP FiO2 (%):  [40 %-50 %] 40 % PEEP:  [5 cmH20] 5 cmH20 Pressure Support:  [10 cmH20] 10 cmH20   Intake/Output Summary (Last 24 hours) at 10/08/2023 1728 Last data filed at 10/08/2023 1545 Gross per 24 hour  Intake --  Output 200 ml  Net -200 ml   There were no vitals filed for this visit.  Examination: General: 53 year old chronically ill-appearing male patient currently on noninvasive positive pressure ventilation HENT: BiPAP mask in place no clear neck vein distention Lungs: Diminished throughout, does have some crackles in his bases Portable chest x-ray personally reviewed this shows cardiomegaly, as well as patchy bilateral airspace disease Cardiovascular: Regular rate and rhythm with systolic heart murmur Abdomen: soft.  Extremities: marked lower extremity edema bilaterally.  Erythemic and warm to touch.  He is right foot has an open ulceration, with all of his toes amputated from that extremity, his left foot has a prior left great toe amputation with a current dressing over the heel of his left foot Neuro: Lethargic, will follow commands GU: Due to void  Resolved Hospital Problem list     Assessment & Plan:  Acute hypoxic and hypercarbic resp failure 2/2 acute heart failure w/ pulmonary edema, influenza A and possible PNA.  Plan Cont NIPPV, repeating ABG but high risk for intubation. Unfortunately poor candidate for it as well NPO IV diuresis as able  Empiric abx  Resp isolation  IV steroids   Acute on chronic biventricular heart failure (systolic EF 20-25%) Plan Admit to ICU IV lasix Cont dig Tele  Seen by advanced HF. He is end-stage. Not candidate for inotrope or mechanical support  Rule out  sepsis -no leukocytosis. Has had prior osteo. Both feet red w/ open wound on right foot. So recurrent osteo as well as infulenza and bacterial superinfection also on ddx Plan Trend cbc Ck PCT Abx as above May need to consider imaging LEs. Although I'm not sure he would survive suregery   Anion gap metabolic acidosis  Plan Ck lactate Treat resp failure Maximize CO as able   Acute metabolic encephalopathy 2/2 hypercarbia, possibly sepsis as well Plan Supportive care Careful w/ sedation   Fluid and electrolyte imbalance: hypomagnesemia, hyponatremia (likely 2/2 HF),  Plan Serial chems Replace Mg  Increased LFTs. Hepatic congestion vs shock liver Plan Repeat in am Supportive care  Poorly controlled DM Plan Ssi   Bilateral diabetic ulcers both feet Plan WOC consult    Best Practice (right click and "Reselect all SmartList Selections" daily)   Diet/type: NPO DVT prophylaxis prophylactic heparin  Pressure ulcer(s): present on admission  GI prophylaxis: N/A Lines: N/A Foley:  N/A Code Status:  full code Last date of multidisciplinary goals of care discussion [pending]  Labs   CBC: Recent Labs  Lab 10/08/23 1456 10/08/23 1528  WBC 4.4  --   HGB 13.6 15.3  HCT 44.0 45.0  MCV 93.2  --   PLT 246  --     Basic Metabolic Panel: Recent Labs  Lab 10/08/23 1506 10/08/23 1528  NA 131* 136  K 4.6 4.5  CL 100  --   CO2 16*  --   GLUCOSE 165*  --   BUN 36*  --   CREATININE 1.25*  --   CALCIUM 8.8*  --   MG 1.5*  --    GFR: CrCl cannot be calculated (Unknown ideal weight.). Recent Labs  Lab 10/08/23 1456  WBC 4.4    Liver Function Tests: Recent Labs  Lab 10/08/23 1506  AST 62*  ALT 46*  ALKPHOS 154*  BILITOT 1.0  PROT 8.5*  ALBUMIN 2.5*   No results for input(s): "LIPASE", "AMYLASE" in the last 168 hours. No results for input(s): "AMMONIA" in the last 168 hours.  ABG    Component Value Date/Time   PHART 7.427 11/30/2021 1044   PCO2ART  51.1 (H) 11/30/2021 1044   PO2ART 88 11/30/2021 1044   HCO3 23.5 10/08/2023 1528   TCO2 25 10/08/2023 1528   ACIDBASEDEF 6.0 (H) 10/08/2023 1528   O2SAT 66 10/08/2023 1528     Coagulation Profile: Recent Labs  Lab 10/08/23 1519  INR 1.4*    Cardiac Enzymes: No results for input(s): "CKTOTAL", "CKMB", "CKMBINDEX", "TROPONINI" in the last 168 hours.  HbA1C: Hgb A1c MFr Bld  Date/Time Value Ref Range Status  06/26/2023 03:00 AM 10.3 (H) 4.8 - 5.6 % Final    Comment:    (NOTE) Pre diabetes:          5.7%-6.4%  Diabetes:              >6.4%  Glycemic control for   <7.0% adults with diabetes   12/26/2022 11:32 AM 11.2 (H) 4.8 - 5.6 % Final    Comment:    (NOTE) Pre diabetes:          5.7%-6.4%  Diabetes:              >  6.4%  Glycemic control for   <7.0% adults with diabetes     CBG: Recent Labs  Lab 10/08/23 1414 10/08/23 1454  GLUCAP 167* 163*    Review of Systems:   Not able   Past Medical History:  He,  has a past medical history of CHF (congestive heart failure) (HCC), Diabetes mellitus without complication (HCC), Heart failure with reduced ejection fraction (HCC) (11/26/2021), Hypertension, Sepsis with encephalopathy and septic shock (HCC) (06/26/2023), and Shock (HCC) (06/26/2023).   Surgical History:   Past Surgical History:  Procedure Laterality Date   AMPUTATION Left 01/25/2023   Procedure: LEFT GREAT TOE AMPUTATION AND DEBRIDEMENT OF HEEL;  Surgeon: Nadara Mustard, MD;  Location: [facility] OR;  Service: Orthopedics;  Laterality: Left;   BACK SURGERY     CHOLECYSTECTOMY     RIGHT/LEFT HEART CATH AND CORONARY ANGIOGRAPHY N/A 11/30/2021   Procedure: RIGHT/LEFT HEART CATH AND CORONARY ANGIOGRAPHY;  Surgeon: Dolores Patty, MD;  Location: [facility] INVASIVE CV LAB;  Service: Cardiovascular;  Laterality: N/A;   TOE AMPUTATION Right    all toes on RT foot   TRANSESOPHAGEAL ECHOCARDIOGRAM (CATH LAB) N/A 07/01/2023   Procedure: TRANSESOPHAGEAL ECHOCARDIOGRAM;   Surgeon: Romie Minus, MD;  Location: [facility] INVASIVE CV LAB;  Service: Cardiovascular;  Laterality: N/A;     Social History:   reports that he has never smoked. He has never used smokeless tobacco. He reports that he does not currently use alcohol. He reports that he does not use drugs.   Family History:  His family history includes Diabetes in his mother; Hypertension in his mother.   Allergies No Known Allergies   Home Medications  Prior to Admission medications   Medication Sig Start Date End Date Taking? Authorizing Provider  digoxin (LANOXIN) 0.125 MG tablet Take 1 tablet (0.125 mg total) by mouth daily. 08/19/23   Bensimhon, Bevelyn Buckles, MD  FUROSCIX 80 MG/10ML CTKT Inject into the skin. Patient not taking: Reported on 10/01/2023 08/09/23   [provider]  magnesium oxide (MAGOX 400) 400 (240 Mg) MG tablet Take 1 tablet (400 mg total) by mouth daily at 12 noon. 09/20/23   Romie Minus, MD  metolazone (ZAROXOLYN) 5 MG tablet Take 1 tablet (5 mg total) by mouth daily as needed (as instructed by HF clinic for extra volume). Take an extra 2 potassium tablets with your Metolazone Patient not taking: Reported on 10/01/2023 09/20/23   Romie Minus, MD  NOVOLOG MIX 70/30 FLEXPEN (70-30) 100 UNIT/ML FlexPen Inject 30 Units into the skin 2 (two) times daily with a meal. 07/01/23   Rocky Morel, DO  potassium chloride SA (KLOR-CON M) 20 MEQ tablet Take 2 tablets (40 mEq total) by mouth daily. Take extra 40 meq on day you take metolazone. Take extra 40 mg on days you use Furoxcix. 08/04/23   Katheran James, DO  sacubitril-valsartan (ENTRESTO) 24-26 MG Take 1 tablet by mouth 2 (two) times daily. 08/04/23   Katheran James, DO  spironolactone (ALDACTONE) 25 MG tablet Take 1 tablet (25 mg total) by mouth daily. 08/04/23   Katheran James, DO  torsemide (DEMADEX) 20 MG tablet Take 4 tablets (80 mg total) by mouth 2 (two) times daily. 09/20/23   Romie Minus, MD      Critical care time: 33 min

## 2023-10-08 NOTE — Progress Notes (Signed)
 eLink Physician-Brief Progress Note Patient Name: ALVAN CULPEPPER DOB: 03-24-1971 MRN: [id number]   Date of Service  10/08/2023  HPI/Events of Note  52/M with chronic CHFrEF with EF 20% presenting with shortness of breath, lethargy and volume overload. Pt is flu positive. Pt is on BIPAP and complaining of anxiety.   BP 113/96, HR 119, RR 18, O2 sats 97%  ABG 7.175/66.4/63  eICU Interventions  Continue on BIPAP. Increase FiO2. Recheck ABG at midnight. Lasix gtt.  Monitor I&Os.  Ok to insert foley cath.  Trial of hydroxyzine for anxiety.  Start tamiflu.  Continue digoxin. Pt received Vanc/Cefepime. Insulin for glucose control.  Heparin for DVT prophylaxis.       Intervention Category Evaluation Type: New Patient Evaluation  Larinda Buttery 10/08/2023, 9:14 PM

## 2023-10-08 NOTE — ED Provider Notes (Signed)
 [place] EMERGENCY DEPARTMENT AT [facility] Provider Note   CSN: [id number] Arrival date & time: 10/08/23  1438     History  Chief Complaint  Patient presents with   Shortness of Breath    Dakota Ramirez is a 53 y.o. male.  Patient sent to the emergency department by the internal medicine clinic.  Patient acutely short of breath.  Patient has a past medical history of congestive heart failure, hypertension peripheral vascular disease, diabetic foot with osteomyelitis.  Patient presented to the internal medicine clinic for evaluation and was sent immediately to the emergency department.  Patient's oxygen saturations were in the 70s.  Patient was placed on oxygen and had improved saturation.  RN reports that patient was initially sleepy and not talking very much.  Patient is now awake and alert after being placed on O2.  Patient is unable to provide me with history he states he is unable to breathe.  The history is provided by the patient and medical records.  Shortness of Breath Associated symptoms: wheezing        Home Medications Prior to Admission medications   Medication Sig Start Date End Date Taking? Authorizing Provider  digoxin (LANOXIN) 0.125 MG tablet Take 1 tablet (0.125 mg total) by mouth daily. 08/19/23   Bensimhon, Bevelyn Buckles, MD  FUROSCIX 80 MG/10ML CTKT Inject into the skin. Patient not taking: Reported on 10/01/2023 08/09/23   [provider]  magnesium oxide (MAGOX 400) 400 (240 Mg) MG tablet Take 1 tablet (400 mg total) by mouth daily at 12 noon. 09/20/23   Romie Minus, MD  metolazone (ZAROXOLYN) 5 MG tablet Take 1 tablet (5 mg total) by mouth daily as needed (as instructed by HF clinic for extra volume). Take an extra 2 potassium tablets with your Metolazone Patient not taking: Reported on 10/01/2023 09/20/23   Romie Minus, MD  NOVOLOG MIX 70/30 FLEXPEN (70-30) 100 UNIT/ML FlexPen Inject 30 Units into the skin 2 (two) times daily with a  meal. 07/01/23   Rocky Morel, DO  potassium chloride SA (KLOR-CON M) 20 MEQ tablet Take 2 tablets (40 mEq total) by mouth daily. Take extra 40 meq on day you take metolazone. Take extra 40 mg on days you use Furoxcix. 08/04/23   Katheran James, DO  sacubitril-valsartan (ENTRESTO) 24-26 MG Take 1 tablet by mouth 2 (two) times daily. 08/04/23   Katheran James, DO  spironolactone (ALDACTONE) 25 MG tablet Take 1 tablet (25 mg total) by mouth daily. 08/04/23   Katheran James, DO  torsemide (DEMADEX) 20 MG tablet Take 4 tablets (80 mg total) by mouth 2 (two) times daily. 09/20/23   Romie Minus, MD      Allergies    Patient has no known allergies.    Review of Systems   Review of Systems  Unable to perform ROS: Acuity of condition  Respiratory:  Positive for shortness of breath and wheezing.     Physical Exam Updated Vital Signs There were no vitals taken for this visit. Physical Exam Vitals and nursing note reviewed.  Constitutional:      Appearance: He is well-developed.  HENT:     Head: Normocephalic.     Mouth/Throat:     Mouth: Mucous membranes are moist.  Cardiovascular:     Rate and Rhythm: Tachycardia present.  Pulmonary:     Effort: Tachypnea present.  Abdominal:     General: There is no distension.     Tenderness: There is no  abdominal tenderness.  Musculoskeletal:     Cervical back: Normal range of motion.     Comments: Bilat partial foot amputaions   Skin:    General: Skin is warm.  Neurological:     General: No focal deficit present.     Mental Status: He is alert and oriented to person, place, and time.     ED Results / Procedures / Treatments   Labs (all labs ordered are listed, but only abnormal results are displayed) Labs Reviewed  CBC - Abnormal; Notable for the following components:      Result Value   RDW 23.0 (*)    nRBC 0.5 (*)    All other components within normal limits  CBG MONITORING, ED - Abnormal; Notable for the  following components:   Glucose-Capillary 163 (*)    All other components within normal limits  I-STAT VENOUS BLOOD GAS, ED - Abnormal; Notable for the following components:   pH, Ven 7.184 (*)    pCO2, Ven 62.4 (*)    Acid-base deficit 6.0 (*)    Calcium, Ion 1.10 (*)    All other components within normal limits  RESP PANEL BY RT-PCR (RSV, FLU A&B, COVID)  RVPGX2  BASIC METABOLIC PANEL  BRAIN NATRIURETIC PEPTIDE  HEPATIC FUNCTION PANEL  DIGOXIN LEVEL  MAGNESIUM  COMPREHENSIVE METABOLIC PANEL  RAPID URINE DRUG SCREEN, HOSP PERFORMED  PROTIME-INR  TROPONIN I (HIGH SENSITIVITY)    EKG None  Radiology No results found.  Procedures .Critical Care  Performed by: Elson Areas, PA-C Authorized by: Elson Areas, PA-C   Critical care provider statement:    Critical care time (minutes):  75   Critical care start time:  10/08/2023 3:00 PM   Critical care end time:  10/08/2023 5:43 PM   Critical care time was exclusive of:  Separately billable procedures and treating other patients and teaching time   Critical care was necessary to treat or prevent imminent or life-threatening deterioration of the following conditions:  Cardiac failure and CNS failure or compromise   Critical care was time spent personally by me on the following activities:  Development of treatment plan with patient or surrogate, discussions with consultants, evaluation of patient's response to treatment, examination of patient, ordering and review of laboratory studies, ordering and review of radiographic studies, ordering and performing treatments and interventions, pulse oximetry, re-evaluation of patient's condition and review of old charts   Care discussed with: admitting provider   Comments:     Critical care consulted and will admit Heart Failure Team consulted      Medications Ordered in ED Medications  potassium chloride SA (KLOR-CON M) CR tablet 20 mEq (has no administration in time range)   furosemide (LASIX) injection 80 mg (has no administration in time range)  ipratropium-albuterol (DUONEB) 0.5-2.5 (3) MG/3ML nebulizer solution 3 mL (3 mLs Nebulization Given 10/08/23 1534)    ED Course/ Medical Decision Making/ A&P                                 Medical Decision Making Pt has a history of heart failure.  Pt has increased shortness of breath today   Amount and/or Complexity of Data Reviewed External Data Reviewed: notes.    Details: Heart failure clinic notes reviewed.   Internal medicine notes reviewed  Labs: ordered. Decision-making details documented in ED Course.    Details: Labs ordered reviewed and interpreted.  Influenza Positive  BNP is 2909  Bun 36,creat 1.25  Ast is 62,alt 46 and alk phos 154   Radiology: ordered and independent interpretation performed. Decision-making details documented in ED Course.    Details: Chest xray ordered reviewed and interpreted  patchy infiltrate right lower  ECG/medicine tests: ordered and independent interpretation performed. Decision-making details documented in ED Course.    Details: EKG  sius tachycardia  Discussion of management or test interpretation with external provider(s): Heart Failure Team consulted Critical care consulted by Dr. Lockie Mola for admission   Risk Prescription drug management.   Pt placed on bipap,  Pt given IV lasix,  Pt with acute heart failure,  not a Sepsis presentation.         Final Clinical Impression(s) / ED Diagnoses Final diagnoses:  Acute congestive heart failure, unspecified heart failure type [facility])  Hypoxia  Influenza    Rx / DC Orders ED Discharge Orders     None         Elson Areas, Cordelia Poche 10/08/23 1749    Virgina Norfolk, DO 10/08/23 1818

## 2023-10-08 NOTE — ED Provider Notes (Signed)
 Shared PA visit.  Patient here with respiratory distress.  Placed on BiPAP.  History of heart failure 20 to 30% EF.  Recent admission for 14 L diuresis.  He is somnolent but he can tell me his name and follow commands but falls back asleep pretty easily.  He is tachycardic but no fever.  Differential could be heart failure exacerbation viral process infectious process.  Hard to get much history from him.  Will do broad workup with labs including troponin BNP blood gas chest x-ray COVID flu testing.  Per my review and interpretation labs he has no leukocytosis but he is positive for influenza A.  BNP is 2900.  Troponin is 25.  EKG shows sinus rhythm with tachycardia.  I reviewed interpreted labs and imaging.  Chest x-ray questionable for edema versus pneumonia.  Overall I wonder if he has a post influenza/viral pneumonia complicated with his heart failure history.  He has been given IV Lasix.  Blood cultures lactic acid antibiotics have been initiated as well.  Heart failure team was consulted came down to the ED and recommend continue diuresis.  His blood gas came back with pH is 7.1 CO2 of 65.  He is mentating okay on BiPAP but still pretty somnolent and I think he would benefit from ICU admission.  They came down to the ED to evaluate and will admit.  Will defer intubation further respiratory care to them at this time.  I have ordered a repeat gas to see if there is any improvement there but I think he is a little bit better than maybe when he came in.  Otherwise he does not really meet sepsis criteria but we will pursue infectious workup.  This could be just flu process as well.  But my suspicion is that this is multifactorial.  This chart was dictated using voice recognition software.  Despite best efforts to proofread,  errors can occur which can change the documentation meaning.    Virgina Norfolk, DO 10/08/23 1732

## 2023-10-08 NOTE — Progress Notes (Signed)
 ED Pharmacy Antibiotic Sign Off An antibiotic consult was received from an ED provider for cefepime and vancomycin per pharmacy dosing for pneumonia. A chart review was completed to assess appropriateness.   The following one time order(s) were placed per pharmacy consult:  vancomycin 2000 mg x 1 dose cefepime 2 g x 1 dose  Further antibiotic and/or antibiotic pharmacy consults should be ordered by the admitting provider if indicated.   Thank you for allowing pharmacy to be a part of this patient's care.   Ernestene Kiel, PharmD PGY1 Pharmacy Resident  Please check AMION for all [facility] Pharmacy phone numbers After 10:00 PM, call Main Pharmacy [phone]

## 2023-10-08 NOTE — Consult Note (Signed)
 Advanced Heart Failure Team Consult Note   Primary Physician: Annett Fabian, MD Cardiologist:  None  Reason for Consultation: Hypoxia and acute on chronic end-stage systolic heart failure  HPI:    Dakota Ramirez is seen today for evaluation of hypoxia and acute on chronic end-stage systolic heart failure at the request of Trisha Mangle, [place].   Dakota Ramirez is a 53 y.o. 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    Admitted 4/23 with new-onset acute HF.  Given IV lasix. Echo EF 20-25%, regional WMA, moderately dilated LV, RV moderately reduced, RVSP 40 mmHg, mild to moderate MR, dilated IVC with estimated RAP 15 mmHg. Cards consulted and GDMT started. 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  Overall diuresed 50 lbs. No SGLT2i with uncontrolled DM. Discharged home, weight 259 lbs.    cMRI 4/24 showed severe biventricular failure, LVEF 17%. RVEF 22% LGE imaging very poor quality, which limits interpretation. 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    Follow up 12/26/22, NYHA IIIb, markedly volume overload weight up 40 lbs, ReDs 42%. Advised to go to ED for evaluation, where he was admitted. Diuresed with IV lasix. Echo showed EF <20%, LV with GHK, RV mildly reduced, LA/RA sev dilated, mild-mod MR, mild TR, trivial pericardial effusion. GDMT titrated. Dr Lajoyce Corners consulted for chronic osteomyelitis and Unna boot placed on RLE, no indication for surgical intervention.   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   Admitted 06/25/23 with mixed cardiogenic and septic shock secondary to LE cellulitis/wounds w/ ? osteomyelitis. He required pressor support with norepinephrine. UDS + for amphetamines. Advanced heart failure consulted. Diuresed with IV lasix and GDMT slowly added back. Echo with EF 20-25%, RV  mildly reduced, new mobile density on posterior mitral valve leaflet. Subsequently had TEE which did not show any evidence of vegetation. ID consulted. 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 Additional imaging to foot wounds also suggested. He refused to stay for additional antibiotics and treatment. Not a candidate for home antibiotics and PICC line d/t substance abuse.   Post hospital follow up 11/24, NYHA IIIb and volume overloaded. Concern for CGS. Diuretics increased with close follow up. He was unwilling to be re-admitted.   Admitted for HF/shock and massive overload from 12/17 - 08/04/23. Diuresed 60 pounds. D/c weight 253 pounds. Also treated for osteomyelitis Linezolid 600 BID x 6 weeks and Cefadroxil 500 BID x 6 weeks   Seen in AHF 1/30 and 2/3. He was 30lbs overloaded on the 20th. Diuretics increased and given metolazone. Up additional 2lbs at f/u on 2/3. Agreed to admission. Diuresed 60lbs with lasix gtt + metolazone. Ortho was consulted d/t concerns for osteomyelitis, amputation was recommended. Patient wanted to go home and think about it.   Presented today for PCP visit and was noted to be volume overloaded, somnolent and hypoxic to the 70s on room air. He was sent to the ED for further eval. Lost of labs pending at this time but VBG showed pH 7.1, CO2 64. He was placed on bipap. Resp panel pending. EKG with ST 110s. Denies sick contacts.  Resting in stretcher. Somnolent on bipap but nods appropriately to questions. Reports compliance with meds.   Home Medications Prior to Admission medications   Medication Sig Start Date End Date Taking? Authorizing Provider  digoxin (LANOXIN) 0.125 MG tablet Take 1 tablet (0.125 mg total) by mouth daily. 08/19/23   Bensimhon, Bevelyn Buckles, MD  FUROSCIX 80 MG/10ML CTKT Inject into the skin. Patient not taking: Reported on 10/01/2023 08/09/23   [provider]  magnesium oxide (MAGOX 400) 400 (240 Mg) MG tablet  Take 1 tablet (400 mg total) by mouth daily at 12 noon. 09/20/23   Romie Minus, MD  metolazone (ZAROXOLYN) 5 MG tablet Take 1 tablet (5 mg total) by mouth daily as needed (as instructed by HF clinic for extra volume). Take an extra 2 potassium tablets with your Metolazone Patient not taking: Reported on 10/01/2023 09/20/23   Romie Minus, MD  NOVOLOG MIX 70/30 FLEXPEN (70-30) 100 UNIT/ML FlexPen Inject 30 Units into the skin 2 (two) times daily with a meal. 07/01/23   Rocky Morel, DO  potassium chloride SA (KLOR-CON M) 20 MEQ tablet Take 2 tablets (40 mEq total) by mouth daily. Take extra 40 meq on day you take metolazone. Take extra 40 mg on days you use Furoxcix. 08/04/23   Katheran James, DO  sacubitril-valsartan (ENTRESTO) 24-26 MG Take 1 tablet by mouth 2 (two) times daily. 08/04/23   Katheran James, DO  spironolactone (ALDACTONE) 25 MG tablet Take 1 tablet (25 mg total) by mouth daily. 08/04/23   Katheran James, DO  torsemide (DEMADEX) 20 MG tablet Take 4 tablets (80 mg total) by mouth 2 (two) times daily. 09/20/23   Romie Minus, MD    Past Medical History: Past Medical History:  Diagnosis Date   CHF (congestive heart failure) (HCC)    Diabetes mellitus without complication (HCC)    Type II   Heart failure with reduced ejection fraction (HCC) 11/26/2021   Hypertension    Sepsis with encephalopathy and septic shock (HCC) 06/26/2023   Shock (HCC) 06/26/2023    Past Surgical History: Past Surgical History:  Procedure Laterality Date   AMPUTATION Left 01/25/2023   Procedure: LEFT GREAT TOE AMPUTATION AND DEBRIDEMENT OF HEEL;  Surgeon: Nadara Mustard, MD;  Location: [facility] OR;  Service: Orthopedics;  Laterality: Left;   BACK SURGERY     CHOLECYSTECTOMY     RIGHT/LEFT HEART CATH AND CORONARY ANGIOGRAPHY N/A 11/30/2021   Procedure: RIGHT/LEFT HEART CATH AND CORONARY ANGIOGRAPHY;  Surgeon: Dolores Patty, MD;  Location: [facility] INVASIVE CV LAB;  Service:  Cardiovascular;  Laterality: N/A;   TOE AMPUTATION Right    all toes on RT foot   TRANSESOPHAGEAL ECHOCARDIOGRAM (CATH LAB) N/A 07/01/2023   Procedure: TRANSESOPHAGEAL ECHOCARDIOGRAM;  Surgeon: Romie Minus, MD;  Location: [facility] INVASIVE CV LAB;  Service: Cardiovascular;  Laterality: N/A;    Family History: Family History  Problem Relation Age of Onset   Diabetes Mother    Hypertension Mother     Social History: Social History   Socioeconomic History   Marital status: Single    Spouse name: Not on file   Number of children: Not on file   Years of education: Not on file   Highest education level: Not on file  Occupational History   Not on file  Tobacco Use   Smoking status: Never   Smokeless tobacco: Never  Vaping Use   Vaping status: Never Used  Substance and Sexual Activity   Alcohol use: Not Currently    Comment: rare   Drug use: Never   Sexual activity: Not on file  Other Topics Concern   Not on file  Social History Narrative   Not on  file   Social Drivers of Health   Financial Resource Strain: High Risk (01/10/2023)   Overall Financial Resource Strain (CARDIA)    Difficulty of Paying Living Expenses: Hard  Food Insecurity: Food Insecurity Present (09/16/2023)   Hunger Vital Sign    Worried About Running Out of Food in the Last Year: Never true    Ran Out of Food in the Last Year: Sometimes true  Transportation Needs: No Transportation Needs (09/16/2023)   PRAPARE - Administrator, Civil Service (Medical): No    Lack of Transportation (Non-Medical): No  Physical Activity: Not on file  Stress: No Stress Concern Present (02/28/2021)   Received from [facility] of Occupational Health - Occupational Stress Questionnaire    Feeling of Stress : Not at all  Social Connections: Unknown (12/15/2021)   Received from [facility]   Social Network    Social Network: Not on file    Allergies:  No Known  Allergies  Objective:    Vital Signs:   FiO2 (%):  [50 %] 50 % (02/25 1523)    Weight change: There were no vitals filed for this visit.  Intake/Output:  No intake or output data in the 24 hours ending 10/08/23 1549    Physical Exam    General:  somnolent/weak appearing HEENT: +bipap Neck: supple. JVD elevated  Cor: PMI nondisplaced. Regular rate & rhythm. No rubs, gallops or murmurs. Lungs: rhonchi throughout Abdomen: distended.  Extremities: no cyanosis, clubbing, rash, +2 BLE edema  Neuro: somnolent but nods appropriately to questions   Telemetry   ST 100-110s (Personally reviewed)    EKG    ST 110s  Labs   Basic Metabolic Panel: Recent Labs  Lab 10/08/23 1528  NA 136  K 4.5    Liver Function Tests: No results for input(s): "AST", "ALT", "ALKPHOS", "BILITOT", "PROT", "ALBUMIN" in the last 168 hours. No results for input(s): "LIPASE", "AMYLASE" in the last 168 hours. No results for input(s): "AMMONIA" in the last 168 hours.  CBC: Recent Labs  Lab 10/08/23 1456 10/08/23 1528  WBC 4.4  --   HGB 13.6 15.3  HCT 44.0 45.0  MCV 93.2  --   PLT 246  --     Cardiac Enzymes: No results for input(s): "CKTOTAL", "CKMB", "CKMBINDEX", "TROPONINI" in the last 168 hours.  BNP: BNP (last 3 results) Recent Labs    07/24/23 1451 07/30/23 0044 08/19/23 1536  BNP 1,670.4* 2,303.1* >4,500.0*    ProBNP (last 3 results) No results for input(s): "PROBNP" in the last 8760 hours.   CBG: Recent Labs  Lab 10/08/23 1414 10/08/23 1454  GLUCAP 167* 163*    Coagulation Studies: No results for input(s): "LABPROT", "INR" in the last 72 hours.   Imaging   No results found.   Medications:     Current Medications:  furosemide  80 mg Intravenous Once   potassium chloride  20 mEq Oral Once    Infusions:   Patient Profile   Nathanial Arrighi is a 53 y.o. 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 Admitted with hypoxia and acute on chronic systolic heart failure.  Assessment/Plan   Acute on Chronic End-Stage Systolic Heart Failure - NICM. Reports hx CHF in his mother. No hx alcohol or drug abuse. 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 RVEF 22% LGE imaging very poor quality. RV insertion site LGE (associated with worse prognosis) - Echo 11/24: EF 20-25%, RV mildly reduced, mild MR - Echo 12/24: EF <10% - NYHA IV on admission - Volume overloaded again. Start lasix gtt at 20 mg/hr with 120 mg bolus prior to infusion. Also will add metolazone 10 mg BID.  - Continue digoxin 0.125 mg daily - Holding Entresto for now, can reevaluate tomorrow - Continue spiro 25 mg daily - Weight pending, discussed with RN to get weight - Strict I&O, daily weights - He is end-stage heart failure with recurrent massive volume overload and he has again failed outpatient management with high-dose oral diuretics.  Suspect non-compliance is contributing. Poor long-term prognosis. - Not a candidate for advanced therapies given chronic wounds and social situation - We have previously offered Palliative/paramedicine services to him but he is not interested in having people come to his house   2. Hypoxia - In the setting of volume overload - Resp panel pending - Continue BiPAP  3. Chronic foot wounds Hx osteomyelitis s/p right TMA - In setting of poorly controlled DM - Treated for cellulitis/osteomyelitis recent admit. Given 6 week course po abx at discharge but concerned about compliance.  - He still has b/l foot wounds. Seen by Wound Care. Photos from 02/03 reviewed in media tab. - ID consulted during last admission, felt no indication for antibiotics. He has been afebrile, no leukocytosis.  - Ortho saw during last admission  recommended bilateral transtibial amputations. Patient wanted to hold off and schedule outpatient   4. Uncontrolled DM2 - Most recent A1C 10.3 11/24 - Not a good candidate for SGLT2i - continue insulin use   5. SDOH  - Now has medicaid - Lives in a camper behind father's house. Unemployed and has limited social support. - Has previously declined Paramedicine and Palliative support on multiple occasions - Will consult palliative care team with end-stage heart failure.   Length of Stay: 0  Alen Bleacher, NP  10/08/2023, 3:49 PM  Advanced Heart Failure Team Pager [phone] (M-F; 7a - 5p)  Please contact [facility] for night-coverage after hours (4p -7a ) and weekends on [email or web address]

## 2023-10-08 NOTE — ED Triage Notes (Signed)
 Came from heart center, Came in with O2 requirement, was sat at 76% for them.  Placed on 6L [place] and 100% Accessory muscles use

## 2023-10-08 NOTE — Progress Notes (Signed)
 Arterial blood gas reviewed, carbon dioxide a little improved, oxygenation adequate.  At bedside he is a little more easily aroused.  Now trying to verbalize a little bit. Plan Will continue NIPPV Admit to the intensive care as previously outlined

## 2023-10-08 NOTE — Progress Notes (Signed)
 Patient transferred from ED to 2M04 without event.

## 2023-10-08 NOTE — Assessment & Plan Note (Addendum)
 Has a known history of end-stage heart failure with recurrent volume overload despite oral diuretics.  His ejection fraction on his most recent echocardiogram in December was less than 10%.  He has failed outpatient high-dose oral diuretics several times.  Recently hospitalized for severe volume overload and heart failure exacerbation from 2/3-2/7.   He is hypoxic in the clinic today with his SpO2 initally at 92%, but downtrending and hovering around the high 70s to mid 80s.  Started him on oxygen in the clinic. We checked a glucose in the clinic which was 167. He is oriented but somnolent. States he feels short of breath.  I cannot get much history out of him, but given my exam and his history, I am worried that he is volume overloaded again causing his hypoxia.  Given his chronic diabetic foot wounds with recent history of osteomyelitis, he may also be septic.   With his acute hypoxia and mental status changes, he needs a higher level of care. He appears quite sick. I have sent him to the emergency department for further workup and management.

## 2023-10-09 ENCOUNTER — Other Ambulatory Visit: Payer: Self-pay

## 2023-10-09 DIAGNOSIS — Z515 Encounter for palliative care: Secondary | ICD-10-CM

## 2023-10-09 DIAGNOSIS — Z7189 Other specified counseling: Secondary | ICD-10-CM | POA: Diagnosis not present

## 2023-10-09 DIAGNOSIS — I5023 Acute on chronic systolic (congestive) heart failure: Secondary | ICD-10-CM | POA: Diagnosis not present

## 2023-10-09 DIAGNOSIS — J9601 Acute respiratory failure with hypoxia: Secondary | ICD-10-CM | POA: Diagnosis not present

## 2023-10-09 DIAGNOSIS — R0902 Hypoxemia: Secondary | ICD-10-CM

## 2023-10-09 DIAGNOSIS — E876 Hypokalemia: Secondary | ICD-10-CM

## 2023-10-09 DIAGNOSIS — I509 Heart failure, unspecified: Secondary | ICD-10-CM

## 2023-10-09 DIAGNOSIS — I5082 Biventricular heart failure: Secondary | ICD-10-CM

## 2023-10-09 DIAGNOSIS — J09X2 Influenza due to identified novel influenza A virus with other respiratory manifestations: Secondary | ICD-10-CM | POA: Diagnosis not present

## 2023-10-09 DIAGNOSIS — G928 Other toxic encephalopathy: Secondary | ICD-10-CM

## 2023-10-09 DIAGNOSIS — J9602 Acute respiratory failure with hypercapnia: Secondary | ICD-10-CM | POA: Diagnosis not present

## 2023-10-09 DIAGNOSIS — J111 Influenza due to unidentified influenza virus with other respiratory manifestations: Secondary | ICD-10-CM

## 2023-10-09 LAB — MAGNESIUM
Magnesium: 1.4 mg/dL — ABNORMAL LOW (ref 1.7–2.4)
Magnesium: 1.6 mg/dL — ABNORMAL LOW (ref 1.7–2.4)

## 2023-10-09 LAB — BASIC METABOLIC PANEL
Anion gap: 12 (ref 5–15)
Anion gap: 12 (ref 5–15)
BUN: 35 mg/dL — ABNORMAL HIGH (ref 6–20)
BUN: 33 mg/dL — ABNORMAL HIGH (ref 6–20)
CO2: 24 mmol/L (ref 22–32)
CO2: 27 mmol/L (ref 22–32)
Calcium: 8.5 mg/dL — ABNORMAL LOW (ref 8.9–10.3)
Calcium: 8.3 mg/dL — ABNORMAL LOW (ref 8.9–10.3)
Chloride: 101 mmol/L (ref 98–111)
Chloride: 96 mmol/L — ABNORMAL LOW (ref 98–111)
Creatinine, Ser: 1.19 mg/dL (ref 0.61–1.24)
Creatinine, Ser: 1.07 mg/dL (ref 0.61–1.24)
GFR, Estimated: >60 mL/min (ref >60)
GFR, Estimated: >60 mL/min (ref >60)
Glucose, Bld: 113 mg/dL — ABNORMAL HIGH (ref 70–99)
Glucose, Bld: 165 mg/dL — ABNORMAL HIGH (ref 70–99)
Potassium: 3.5 mmol/L (ref 3.5–5.1)
Potassium: 3.3 mmol/L — ABNORMAL LOW (ref 3.5–5.1)
Sodium: 137 mmol/L (ref 135–145)
Sodium: 135 mmol/L (ref 135–145)

## 2023-10-09 LAB — CBC
HCT: 41.1 % (ref 39.0–52.0)
Hemoglobin: 12.5 g/dL — ABNORMAL LOW (ref 13.0–17.0)
MCH: 28.3 pg (ref 26.0–34.0)
MCHC: 30.4 g/dL (ref 30.0–36.0)
MCV: 93 fL (ref 80.0–100.0)
Platelets: 200 10*3/uL (ref 150–400)
RBC: 4.42 MIL/uL (ref 4.22–5.81)
RDW: 22.5 % — ABNORMAL HIGH (ref 11.5–15.5)
WBC: 6 10*3/uL (ref 4.0–10.5)
nRBC: 0 % (ref 0.0–0.2)

## 2023-10-09 LAB — GLUCOSE, CAPILLARY
Glucose-Capillary: 124 mg/dL — ABNORMAL HIGH (ref 70–99)
Glucose-Capillary: 94 mg/dL (ref 70–99)
Glucose-Capillary: 138 mg/dL — ABNORMAL HIGH (ref 70–99)
Glucose-Capillary: 186 mg/dL — ABNORMAL HIGH (ref 70–99)
Glucose-Capillary: 127 mg/dL — ABNORMAL HIGH (ref 70–99)

## 2023-10-09 LAB — PROCALCITONIN: Procalcitonin: 0.2 ng/mL

## 2023-10-09 LAB — ETHANOL: Alcohol, Ethyl (B): <10 mg/dL (ref <10)

## 2023-10-09 LAB — PHOSPHORUS: Phosphorus: 3.9 mg/dL (ref 2.5–4.6)

## 2023-10-09 MED ORDER — DEXMEDETOMIDINE HCL IN NACL 400 MCG/100ML IV SOLN
0.0000 ug/kg/h | INTRAVENOUS | Status: DC
  Administered 2023-10-09 (×2): 0.4 ug/kg/h via INTRAVENOUS
  Administered 2023-10-10: 0.9 ug/kg/h via INTRAVENOUS
  Administered 2023-10-10: 0.8 ug/kg/h via INTRAVENOUS
  Administered 2023-10-10: 0.5 ug/kg/h via INTRAVENOUS
  Administered 2023-10-11 (×2): 0.7 ug/kg/h via INTRAVENOUS
  Filled 2023-10-09 (×8): qty 100

## 2023-10-09 MED ORDER — LINEZOLID 600 MG PO TABS
600.0000 mg | ORAL_TABLET | Freq: Two times a day (BID) | ORAL | Status: DC
  Administered 2023-10-09 – 2023-10-11 (×5): 600 mg via ORAL
  Filled 2023-10-09 (×6): qty 1

## 2023-10-09 MED ORDER — POTASSIUM CHLORIDE 10 MEQ/100ML IV SOLN
10.0000 meq | INTRAVENOUS | Status: AC
  Administered 2023-10-09 (×4): 10 meq via INTRAVENOUS
  Filled 2023-10-09 (×4): qty 100

## 2023-10-09 MED ORDER — SODIUM CHLORIDE 0.9 % IV SOLN
250.0000 mL | INTRAVENOUS | Status: DC
Start: 2023-10-09 — End: 2023-10-10

## 2023-10-09 MED ORDER — MAGNESIUM SULFATE 4 GM/100ML IV SOLN
4.0000 g | Freq: Once | INTRAVENOUS | Status: AC
  Administered 2023-10-09: 4 g via INTRAVENOUS
  Filled 2023-10-09: qty 100

## 2023-10-09 MED ORDER — POTASSIUM CHLORIDE CRYS ER 20 MEQ PO TBCR
40.0000 meq | EXTENDED_RELEASE_TABLET | Freq: Every day | ORAL | Status: AC
  Administered 2023-10-09 – 2023-10-10 (×2): 40 meq via ORAL
  Filled 2023-10-09 (×2): qty 2

## 2023-10-09 MED ORDER — IPRATROPIUM-ALBUTEROL 0.5-2.5 (3) MG/3ML IN SOLN
3.0000 mL | Freq: Two times a day (BID) | RESPIRATORY_TRACT | Status: DC
  Administered 2023-10-09 – 2023-10-11 (×4): 3 mL via RESPIRATORY_TRACT
  Filled 2023-10-09 (×4): qty 3

## 2023-10-09 MED ORDER — NOREPINEPHRINE 4 MG/250ML-% IV SOLN
2.0000 ug/min | INTRAVENOUS | Status: DC
  Administered 2023-10-10: 2 ug/min via INTRAVENOUS
  Filled 2023-10-09 (×2): qty 250

## 2023-10-09 MED ORDER — PHENOL 1.4 % MT LIQD
1.0000 | OROMUCOSAL | Status: DC | PRN
  Administered 2023-10-09: 1 via OROMUCOSAL
  Filled 2023-10-09: qty 177

## 2023-10-09 MED ORDER — DEXTROSE 5 % IV SOLN
500.0000 mg | Freq: Once | INTRAVENOUS | Status: AC
  Administered 2023-10-09: 500 mg via INTRAVENOUS
  Filled 2023-10-09: qty 17.8

## 2023-10-09 MED ORDER — LORAZEPAM 2 MG/ML IJ SOLN: 0.5000 mg | Freq: Once | INTRAMUSCULAR | Status: DC

## 2023-10-09 MED ORDER — OSELTAMIVIR PHOSPHATE 75 MG PO CAPS
75.0000 mg | ORAL_CAPSULE | Freq: Two times a day (BID) | ORAL | Status: DC
  Administered 2023-10-09 – 2023-10-11 (×5): 75 mg via ORAL
  Filled 2023-10-09 (×6): qty 1

## 2023-10-09 MED ORDER — AMOXICILLIN-POT CLAVULANATE 875-125 MG PO TABS
1.0000 | ORAL_TABLET | Freq: Two times a day (BID) | ORAL | Status: DC
  Administered 2023-10-09 – 2023-10-11 (×5): 1 via ORAL
  Filled 2023-10-09 (×6): qty 1

## 2023-10-09 MED ORDER — ORAL CARE MOUTH RINSE: 15.0000 mL | OROMUCOSAL | Status: DC | PRN

## 2023-10-09 MED ORDER — HYDROXYZINE HCL 50 MG/ML IM SOLN
25.0000 mg | Freq: Once | INTRAMUSCULAR | Status: DC
  Filled 2023-10-09: qty 0.5

## 2023-10-09 NOTE — Progress Notes (Signed)
 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 Unfortunately not much further history was able to be obtained the patient was lethargic, hypoxic with pulse oximetry in the 70s to 80s.  He appeared acutely ill.   He was initially placed on nasal cannula but remained quite lethargic white blood cell count was normal, troponin 25 respiratory viral panel was positive for influenza A sodium was 131, bicarbonate 16 BUN 36 creatinine 1.25 up from 1.06 venous blood gas was obtained which showed a pH of 7.18, pCO2 of 62, HCO3 of 23. He was placed on noninvasive positive pressure ventilation, antibiotics were initiated, advanced heart failure was consulted and IV diuresis was initiated as well. Critical care was asked to admit.  Recently hospitalized January 2025 for acute on chronic heart failure aggressively diuresed over 50 pounds discharged to home with weight 259.   Pertinent  Medical History  End-stage heart failure (EF 20 to 25%), not a candidate for advanced therapies, poorly controlled diabetes, prior right TMA osteomyelitis, hypertension, medical noncompliance   Significant Hospital Events: Including procedures, antibiotic start and stop dates in addition to other pertinent events   2/25 admitted with acute hypoxic and hypercarbic respiratory failure, influenza A, acute systolic heart failure, and possible Placed on NIPPV   Interim History / Subjective:  Agitated and yelling.  Off BiPAP AO x 4.  Reports that he is thirsty, hot and dyspnea  He denies any drugs and  alcohol use   Objective   Blood pressure 125/88, pulse (!) 114, temperature 98.6 F (37 C), temperature source Axillary, resp. rate 19, height 5\' 11"  (1.803 m), weight 118.7 kg, SpO2 96%.    Vent Mode: PSV;BIPAP FiO2 (%):  [40 %-50 %] 40 % PEEP:  [5 cmH20] 5 cmH20 Pressure Support:  [8 cmH20-10 cmH20] 8 cmH20   Intake/Output Summary (Last 24 hours) at 10/09/2023 1056 Last data filed at 10/09/2023 1000 Gross per 24 hour  Intake 1399.75 ml  Output 5150 ml  Net -3750.25 ml   Filed Weights   10/08/23 2302 10/09/23 0400  Weight: 122.3 kg 118.7 kg    Examination: General: Appears ill, off BiPAP, on 5 L oxygen via nasal cannula   HENT: [place]/AT.  Lungs: Decrease breath sounds left lower lung field. No wheezing or crackles  Cardiovascular: +JVD +hepatojugular reflex +tachycardiac  Abdomen: soft, NT. ND  Extremities: +RLE erythema, warm and tenderness, Left toe amputation, R metatarsal amputation. Both feet are dressed. Images in chart.  Neuro: A O x 4  GU: foley catheter  Skin: Cold and Wet   Pertinent Imaging: CXR: Cardiac enlargement. Patchy infiltration or atelectasis in the right base.  Resolved Hospital Problem list   N/A   Assessment & Plan:  Acute hypoxic/Hypercarbic respiratory failure  2/2 Acute on Chronic end stage [biventricular] HF, exacerbation, EF < 10% Volume overload  Influenza A  - Off BiPAP for now  - Appreciate cardiology, not a candidate for advanced therapies   - IV Lasix gtt 30 mg/ hr and IV Diuril 500 mg  - Digoxin 0.125 mg daily - Cleda Daub  25 mg daily - Tamiflu 75 mg daily for 5 days  - Duonebs BID   - Strict Is & Os and daily weight checks - Replete electrolytes with goal K > 4.0 and Mg > 2.0   Acute on Chronic Osteomyelitis  Open foot wounds Hx of R metatarsal amputation  Hx of L toe amputation Poorly controlled T2DM RLE Cellulitis  Per last admission on 09/16/2023, he was seen by ortho, recommended bilateral transtibial amputations, though he  wanted to hold off to outpatient. He had an MRI R foot and L foot on 07/30/2023 concerning for osteomyelitis. Per evaluation today, RLE is concerning for cellulitis.  - Orthopedics to evaluate - PO Zyvox 600 mg BID and  Augmentin BID - Would care   Polysubstance Abuse UDS positive for Amphetamines and THC. Agitated, tachycardiac, dry heaves. Denies any recent alcohol or drugs.   - Atarax 10 mg TID - IV Precedex   Uncontrolled T2DM - SSI   Best Practice (right click and "Reselect all SmartList Selections" daily)   Diet/type: heart healthy with fluid restriction  DVT prophylaxis Lovenox  Pressure ulcer(s): Assessed by RN  GI prophylaxis: N/A Lines: Peripheral  Foley:  Yes, and it is still needed Code Status:  full code Last date of multidisciplinary goals of care discussion [--]  Labs   CBC: Recent Labs  Lab 10/08/23 1456 10/08/23 1528 10/08/23 1756 10/08/23 1823 10/08/23 1837 10/08/23 2335 10/09/23 0236  WBC 4.4  --   --  3.9*  --   --  6.0  HGB 13.6   < > 15.0 13.7 16.3 13.6 12.5*  HCT 44.0   < > 44.0 45.2 48.0 40.0 41.1  MCV 93.2  --   --  96.0  --   --  93.0  PLT 246  --   --  204  --   --  200   < > = values in this interval not displayed.    Basic Metabolic Panel: Recent Labs  Lab 10/08/23 1506 10/08/23 1528 10/08/23 1756 10/08/23 1823 10/08/23 1837 10/08/23 2335 10/09/23 0236  NA 131* 136 136  --  136 136 137  K 4.6 4.5 4.4  --  4.4 3.5 3.5  CL 100  --   --   --   --   --  101  CO2 16*  --   --   --   --   --  24  GLUCOSE 165*  --   --   --   --   --  113*  BUN 36*  --   --   --   --   --  35*  CREATININE 1.25*  --   --  1.40*  --   --  1.19  CALCIUM 8.8*  --   --   --   --   --  8.5*  MG 1.5*  --   --   --   --   --  1.4*  PHOS  --   --   --   --   --   --  3.9   GFR: Estimated Creatinine Clearance: 96.3 mL/min (by C-G formula based on SCr of 1.19 mg/dL). Recent Labs  Lab 10/08/23 1456 10/08/23 1823 10/08/23 1837 10/08/23 1957  10/09/23 0236  WBC 4.4 3.9*  --   --  6.0  LATICACIDVEN  --   --  4.0* 3.6*  --     Liver Function Tests: Recent Labs  Lab 10/08/23 1506 10/08/23 2159  AST 62* 60*  ALT 46* 43  ALKPHOS 154* 141*  BILITOT 1.0 1.0  PROT 8.5* 8.4*  ALBUMIN 2.5* 2.4*   No results for input(s): "LIPASE", "AMYLASE" in the last 168 hours. No results for input(s): "AMMONIA" in the last 168 hours.  ABG    Component Value Date/Time   PHART 7.299 (L) 10/08/2023 2335   PCO2ART 52.0 (H) 10/08/2023 2335   PO2ART 116 (H) 10/08/2023 2335   HCO3 25.8 10/08/2023 2335   TCO2 27 10/08/2023 2335   ACIDBASEDEF 2.0 10/08/2023 2335   O2SAT 98 10/08/2023 2335     Coagulation Profile: Recent Labs  Lab 10/08/23 1519  INR 1.4*    Cardiac Enzymes: No results for input(s): "CKTOTAL", "CKMB", "CKMBINDEX", "TROPONINI" in the last 168 hours.  HbA1C: Hgb A1c MFr Bld  Date/Time Value Ref Range Status  06/26/2023 03:00 AM 10.3 (H) 4.8 - 5.6 % Final    Comment:    (NOTE) Pre diabetes:          5.7%-6.4%  Diabetes:              >6.4%  Glycemic control for   <7.0% adults with diabetes   12/26/2022 11:32 AM 11.2 (H) 4.8 - 5.6 % Final    Comment:    (NOTE) Pre diabetes:          5.7%-6.4%  Diabetes:              >6.4%  Glycemic control for   <7.0% adults with diabetes     CBG: Recent Labs  Lab 10/08/23 2023 10/08/23 2108 10/08/23 2322 10/09/23 0321 10/09/23 0825  GLUCAP 193* 135* 105* 124* 94    Review of Systems:   As per subjective   Past Medical History:  He,  has a past medical history of CHF (congestive heart failure) (HCC), Diabetes mellitus without complication (HCC), Heart failure with reduced ejection fraction (HCC) (11/26/2021), Hypertension, Sepsis with encephalopathy and septic shock (HCC) (06/26/2023), and Shock (HCC) (06/26/2023).   Surgical History:   Past Surgical History:  Procedure Laterality Date   AMPUTATION Left 01/25/2023   Procedure: LEFT GREAT TOE AMPUTATION AND  DEBRIDEMENT OF HEEL;  Surgeon: Nadara Mustard, MD;  Location: [facility] OR;  Service: Orthopedics;  Laterality: Left;   BACK SURGERY     CHOLECYSTECTOMY     RIGHT/LEFT HEART CATH AND CORONARY ANGIOGRAPHY N/A 11/30/2021   Procedure: RIGHT/LEFT HEART CATH AND CORONARY ANGIOGRAPHY;  Surgeon: Dolores Patty, MD;  Location: [facility] INVASIVE CV LAB;  Service: Cardiovascular;  Laterality: N/A;   TOE AMPUTATION Right    all toes on RT foot   TRANSESOPHAGEAL ECHOCARDIOGRAM (CATH LAB) N/A 07/01/2023   Procedure: TRANSESOPHAGEAL ECHOCARDIOGRAM;  Surgeon: Romie Minus, MD;  Location: [facility] INVASIVE CV LAB;  Service: Cardiovascular;  Laterality: N/A;     Social History:   reports that he has never smoked. He has never used smokeless tobacco. He reports that he does not currently use alcohol. He reports that he does not use drugs.   Family History:  His family history includes Diabetes in his mother; Hypertension in his mother.   Allergies No Known Allergies   Home Medications  Prior to Admission medications   Medication Sig Start Date End Date Taking? Authorizing Provider  digoxin (LANOXIN) 0.125 MG tablet Take 1 tablet (0.125 mg total) by mouth daily. 08/19/23   Bensimhon, Bevelyn Buckles, MD  FUROSCIX 80 MG/10ML CTKT Inject into the skin. Patient not taking: Reported on 10/01/2023 08/09/23   [provider]  magnesium oxide (MAGOX 400) 400 (240 Mg) MG tablet Take 1 tablet (400 mg total) by mouth daily at 12 noon. 09/20/23   Romie Minus, MD  metolazone (ZAROXOLYN) 5 MG tablet Take 1 tablet (5 mg total) by mouth daily as needed (as instructed by HF clinic for extra volume). Take an extra 2 potassium tablets with your Metolazone Patient not taking: Reported on 10/01/2023 09/20/23   Romie Minus, MD  NOVOLOG MIX 70/30 FLEXPEN (70-30) 100 UNIT/ML FlexPen Inject 30 Units into the skin 2 (two) times daily with a meal. 07/01/23   Rocky Morel, DO  potassium chloride SA (KLOR-CON M) 20 MEQ tablet Take  2 tablets (40 mEq total) by mouth daily. Take extra 40 meq on day you take metolazone. Take extra 40 mg on days you use Furoxcix. 08/04/23   Katheran James, DO  sacubitril-valsartan (ENTRESTO) 24-26 MG Take 1 tablet by mouth 2 (two) times daily. 08/04/23   Katheran James, DO  spironolactone (ALDACTONE) 25 MG tablet Take 1 tablet (25 mg total) by mouth daily. 08/04/23   Katheran James, DO  torsemide (DEMADEX) 20 MG tablet Take 4 tablets (80 mg total) by mouth 2 (two) times daily. 09/20/23   Romie Minus, MD     Critical care time:

## 2023-10-09 NOTE — Progress Notes (Addendum)
 Advanced Heart Failure Rounding Note  Cardiologist: None  Chief Complaint: CHF Exacerbation Subjective:    4.5L UOP. Weight down 8 lbs overnight.  Unable to take PO meds due to Bipap, did not receive metolazone.  Cr 1.4>1.19  USD + for amphetamines. Drowsy on Bipap, unable to get full ROS.   Objective:   Weight Range: 118.7 kg Body mass index is 36.5 kg/m.   Vital Signs:   Temp:  [97.4 F (36.3 C)-98.9 F (37.2 C)] 98.1 F (36.7 C) (02/26 0313) Pulse Rate:  [72-122] 114 (02/26 0809) Resp:  [17-23] 18 (02/26 0809) BP: (113-138)/(70-104) 122/86 (02/26 0720) SpO2:  [85 %-100 %] 98 % (02/26 0809) FiO2 (%):  [40 %-50 %] 40 % (02/26 0809) Weight:  [118.7 kg-122.3 kg] 118.7 kg (02/26 0400)   Weight change: Filed Weights   10/08/23 2302 10/09/23 0400  Weight: 122.3 kg 118.7 kg   Intake/Output:   Intake/Output Summary (Last 24 hours) at 10/09/2023 0811 Last data filed at 10/09/2023 0640 Gross per 24 hour  Intake 780.71 ml  Output 4500 ml  Net -3719.29 ml    Physical Exam    General: Critically Ill appearing. On Bipap   Cardiac: JVP to jaw. S1 and S2 present. No murmurs or rub. Extremities: Warm and dry. BLE red, ruddy 2-3+ edema. L TMA Neuro: On Bipap, drowsy Devices: Foley  Telemetry   ST in 100-110s (personally reviewed)  EKG    N/A  Labs    CBC Recent Labs    10/08/23 1823 10/08/23 1837 10/08/23 2335 10/09/23 0236  WBC 3.9*  --   --  6.0  HGB 13.7   < > 13.6 12.5*  HCT 45.2   < > 40.0 41.1  MCV 96.0  --   --  93.0  PLT 204  --   --  200   < > = values in this interval not displayed.   Basic Metabolic Panel Recent Labs    62/13/08 1506 10/08/23 1528 10/08/23 1823 10/08/23 1837 10/08/23 2335 10/09/23 0236  NA 131*   < >  --    < > 136 137  K 4.6   < >  --    < > 3.5 3.5  CL 100  --   --   --   --  101  CO2 16*  --   --   --   --  24  GLUCOSE 165*  --   --   --   --  113*  BUN 36*  --   --   --   --  35*  CREATININE 1.25*  --   1.40*  --   --  1.19  CALCIUM 8.8*  --   --   --   --  8.5*  MG 1.5*  --   --   --   --  1.4*  PHOS  --   --   --   --   --  3.9   < > = values in this interval not displayed.   Liver Function Tests Recent Labs    10/08/23 1506 10/08/23 2159  AST 62* 60*  ALT 46* 43  ALKPHOS 154* 141*  BILITOT 1.0 1.0  PROT 8.5* 8.4*  ALBUMIN 2.5* 2.4*   No results for input(s): "LIPASE", "AMYLASE" in the last 72 hours. Cardiac Enzymes No results for input(s): "CKTOTAL", "CKMB", "CKMBINDEX", "TROPONINI" in the last 72 hours.  BNP: BNP (last 3 results) Recent Labs    07/30/23  1610 08/19/23 1536 10/08/23 1456  BNP 2,303.1* >4,500.0* 2,909.0*    ProBNP (last 3 results) No results for input(s): "PROBNP" in the last 8760 hours.   D-Dimer No results for input(s): "DDIMER" in the last 72 hours. Hemoglobin A1C No results for input(s): "HGBA1C" in the last 72 hours. Fasting Lipid Panel No results for input(s): "CHOL", "HDL", "LDLCALC", "TRIG", "CHOLHDL", "LDLDIRECT" in the last 72 hours. Thyroid Function Tests No results for input(s): "TSH", "T4TOTAL", "T3FREE", "THYROIDAB" in the last 72 hours.  Invalid input(s): "FREET3"  Other results:   Imaging    DG Chest Portable 1 View Result Date: 10/08/2023 CLINICAL DATA:  Shortness of breath and hypoxia. EXAM: PORTABLE CHEST 1 VIEW COMPARISON:  07/30/2023 FINDINGS: Shallow inspiration. Cardiac enlargement with mild pulmonary vascular congestion. Patchy infiltrates suggested in the right lung base may represent atelectasis or pneumonia. No pleural effusion or pneumothorax. Mediastinal contours appear intact. Old rib fractures. IMPRESSION: Cardiac enlargement. Patchy infiltration or atelectasis in the right base. Electronically Signed   By: Burman Nieves M.D.   On: 10/08/2023 16:02     Medications:     Scheduled Medications:  Chlorhexidine Gluconate Cloth  6 each Topical Daily   digoxin  0.125 mg Oral Daily   enoxaparin (LOVENOX)  injection  40 mg Subcutaneous Q24H   insulin aspart  0-20 Units Subcutaneous Q4H   ipratropium-albuterol  3 mL Nebulization Q4H   spironolactone  25 mg Oral Daily    Infusions:  chlorothiazide (DIURIL) 500 mg in dextrose 5 % 50 mL IVPB     furosemide (LASIX) 200 mg in dextrose 5 % 100 mL (2 mg/mL) infusion 20 mg/hr (10/09/23 0546)   potassium chloride 10 mEq (10/09/23 0754)    PRN Medications: docusate sodium, hydrOXYzine, polyethylene glycol    Patient Profile   Dakota Ramirez is a 53 y.o. 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 Admitted with hypoxia and acute on chronic systolic heart failure.   Assessment/Plan    Acute on Chronic End-Stage Systolic Heart Failure - NICM. Reports hx CHF in his mother. No hx alcohol or drug abuse. - R/LHC (4/23): showed normal cors, severe NICM EF 20-25% and mildly elevated filling pressures w/ normal CO - cMRI (4/24): showed severe biventricular failure, LVEF 17%. RVEF 22% LGE imaging very poor quality. RV insertion site LGE (associated with worse prognosis) - Echo 11/24: EF 20-25%, RV mildly reduced, mild MR - Echo 12/24: EF <10% - NYHA IV on admission - Volume overloaded again.  - Increase lasix gtt to 30 mg/hr. Unable to take PO d/t bipap use. Give Diuril 500 mg IV.  - Resume PO meds below once able to tolerate off bipap: - Continue digoxin 0.125 mg daily - Continue spiro 25 mg daily - Hold Entresto with possible infectious concerns, follow w/u - He is end-stage heart failure with recurrent massive volume overload and he has again failed outpatient management with high-dose oral diuretics.  Suspect non-compliance is contributing. Poor long-term prognosis. - Not a candidate for advanced therapies given chronic wounds and social situation - We have previously offered Palliative/paramedicine services to him but he is not interested in  having people come to his house   2. Hypoxia FLU A + - In the setting of volume overload - RVP + for Flu A - blood cx pending - Continue BiPAP   3. Chronic foot wounds Hx osteomyelitis s/p right TMA - In setting of  poorly controlled DM - Treated for cellulitis/osteomyelitis recent admit. Given 6 week course po abx at discharge but concerned about compliance.  - He still has b/l foot wounds. Seen by Wound Care. Photos from 02/03 reviewed in media tab. - ID consulted during last admission, felt no indication for antibiotics. He has been afebrile, no leukocytosis.  - Ortho saw during last admission recommended bilateral transtibial amputations. Patient wanted to hold off and schedule outpatient   4. Uncontrolled DM2 - Most recent A1C 10.3 11/24 - Not a good candidate for SGLT2i - continue insulin use   5. SDOH  - USD + for amphetamines - Now has medicaid - Lives in a camper behind father's house. Unemployed and has limited social support. - Has previously declined Paramedicine and Palliative support on multiple occasions - Will consult palliative care team with end-stage heart failure.   Length of Stay: 1  [place] Lee, NP  10/09/2023, 8:11 AM  Advanced Heart Failure Team Pager [phone] (M-F; 7a - 5p)  Please contact [facility] for night-coverage after hours (5p -7a ) and weekends on [email or web address]   Agree with above.   Remains lethargic on bipap. Will arouse but fall back to sleep. Now off pressors. On lasix gtt. He is flu +  UDS + amphetamines  General:  Ill-appearing. On bipap HEENT: normal Neck: supple.jvp to ear   Cor: Reg tachy  Lungs: + rhnchi Abdomen: ++ distended. Good bowel sounds. Extremities: no cyanosis, clubbing, rash, 3+ edema + wounds  Neuro:lethargic will arouse but not communicative. Moves all 4   He has end-stage HF. Not candidate for advanced therapies or mechanical support. Has refused Palliative Care involvement   Decompensated again in setting of  noncompliance, drug use and influenza  Would support as able. Continue diuresis. Would limit escalation given poor QoL and irreversible issues.   CRITICAL CARE Performed by: Arvilla Meres  Total critical care time: 40 minutes  Critical care time was exclusive of separately billable procedures and treating other patients.  Critical care was necessary to treat or prevent imminent or life-threatening deterioration.  Critical care was time spent personally by me (independent of midlevel providers or residents) on the following activities: development of treatment plan with patient and/or surrogate as well as nursing, discussions with consultants, evaluation of patient's response to treatment, examination of patient, obtaining history from patient or surrogate, ordering and performing treatments and interventions, ordering and review of laboratory studies, ordering and review of radiographic studies, pulse oximetry and re-evaluation of patient's condition.  Arvilla Meres, MD  9:44 PM

## 2023-10-09 NOTE — Progress Notes (Signed)
 Internal Medicine Clinic Attending  I was physically present during the key portions of the resident provided service and participated in the medical decision making of patient's management care. I reviewed pertinent patient test results.  The assessment, diagnosis, and plan were formulated together and I agree with the documentation in the resident's note.  Mercie Eon, MD    [phone] man with end stage HFrEF with EF<20% who presented to the [facility] for a scheduled follow up.  He is very sick.  When I saw him, his oxygen dropped to 80% on room air. I placed him on 6Lnc. Tachycardic to 110s. He was awake and able to answer simple questions (ex- no chest pain), but is sleepy and not at baseline mental status.  His main cc is shortness of breath.  We quickly transported him to the ER for further care of acute hypoxic (probably hypercapnic too) respiratory failure

## 2023-10-09 NOTE — Consult Note (Addendum)
 WOC Nurse Consult Note: Reason for Consult: Consult requested for bilat heels.  Performed remotely after review of progress notes and photos in the EMR.  Pt has chronic full thickness wounds to bilat plantar heels.  A wound consult was recently performed on the previous admission on 2/4 and Dr Lajoyce Corners of the ortho service performed a consult on 2/7 and recommended bilat amputations.  Left heel is 5X5X2cm, red and moist, according to the bedside nurses' wound care flow sheet.  Right heel is 5X6cm, 15% red and moist, 50% yellow, 35% black, according to the bedside nurses' wound care flow sheet.   Secure chat message sent to the primary team as follows: "Pt had a consult performed by Dr Lajoyce Corners of the ortho service during a previous admission on 2/7 and he recommended bilat amputations for osteomyelitis; please consult him for further plan of care."   Dressing procedure/placement/frequency: Topical treatment orders provided for bedside nurses to perform as follows: Apply Aquacel [place] # [phone]) to bilat heel wounds Q day, using swab to fill, then cover with foam dressings.  Change foam dressings Q 3 days or PRN soiling. Please re-consult if further assistance is needed.  Thank-you,  Cammie Mcgee MSN, RN, CWOCN, [place], CNS [phone]

## 2023-10-09 NOTE — Plan of Care (Signed)
  Problem: Education: Goal: Ability to describe self-care measures that may prevent or decrease complications (Diabetes Survival Skills Education) will improve Outcome: Progressing Goal: Individualized Educational Video(s) Outcome: Progressing   Problem: Coping: Goal: Ability to adjust to condition or change in health will improve Outcome: Progressing   Problem: Fluid Volume: Goal: Ability to maintain a balanced intake and output will improve Outcome: Progressing   Problem: Health Behavior/Discharge Planning: Goal: Ability to identify and utilize available resources and services will improve Outcome: Progressing Goal: Ability to manage health-related needs will improve Outcome: Progressing   Problem: Metabolic: Goal: Ability to maintain appropriate glucose levels will improve Outcome: Progressing   Problem: Nutritional: Goal: Maintenance of adequate nutrition will improve Outcome: Progressing Goal: Progress toward achieving an optimal weight will improve Outcome: Progressing   Problem: Skin Integrity: Goal: Risk for impaired skin integrity will decrease Outcome: Progressing   Problem: Tissue Perfusion: Goal: Adequacy of tissue perfusion will improve Outcome: Progressing   Problem: Clinical Measurements: Goal: Ability to maintain clinical measurements within normal limits will improve Outcome: Progressing Goal: Will remain free from infection Outcome: Progressing Goal: Diagnostic test results will improve Outcome: Progressing Goal: Respiratory complications will improve Outcome: Progressing Goal: Cardiovascular complication will be avoided Outcome: Progressing   Problem: Elimination: Goal: Will not experience complications related to bowel motility Outcome: Progressing Goal: Will not experience complications related to urinary retention Outcome: Progressing   Problem: Pain Managment: Goal: General experience of comfort will improve and/or be  controlled Outcome: Progressing   Problem: Safety: Goal: Ability to remain free from injury will improve Outcome: Progressing   Problem: Skin Integrity: Goal: Risk for impaired skin integrity will decrease Outcome: Progressing

## 2023-10-09 NOTE — TOC Initial Note (Signed)
 Transition of Care [facility]) - Initial/Assessment Note    Patient Details  Name: Dakota Ramirez MRN: [id number] Date of Birth: [birth year]  Transition of Care [facility]) CM/SW Contact:    Nicanor Bake Phone Number: [phone] 10/09/2023, 1:00 PM  Clinical Narrative: HF CSW attempted to meet with pt at bedside. Pt was screaming and yelling. Labs were at bedside, but stepped out. Pt proceeded to yell. CSW will follow up at amore appropriate time.   TOC will continue following.                         Patient Goals and CMS Choice            Expected Discharge Plan and Services                                              Prior Living Arrangements/Services                       Activities of Daily Living   ADL Screening (condition at time of admission) Independently performs ADLs?: Yes (appropriate for developmental age) Is the patient deaf or have difficulty hearing?: No Does the patient have difficulty seeing, even when wearing glasses/contacts?: No Does the patient have difficulty concentrating, remembering, or making decisions?: No  Permission Sought/Granted                  Emotional Assessment              Admission diagnosis:  CHF (congestive heart failure) (HCC) [I50.9] Hypoxia [R09.02] Influenza [J11.1] Acute congestive heart failure, unspecified heart failure type (HCC) [I50.9] Patient Active Problem List   Diagnosis Date Noted   CHF (congestive heart failure) (HCC) 10/08/2023   Osteomyelitis of ankle and foot (HCC) 09/20/2023   Acute on chronic systolic CHF (congestive heart failure) (HCC) 09/16/2023   Acute on chronic heart failure (HCC) 07/30/2023   Non-ischemic cardiomyopathy (HCC) 07/01/2023   Congestive hepatopathy 06/30/2023   Bacteremia 06/30/2023   Mitral valve vegetation 06/30/2023   Hypoalbuminemia due to protein-calorie malnutrition (HCC) 06/30/2023   Non-adherence to medical treatment 06/30/2023   AKI  (acute kidney injury) (HCC) 06/26/2023   Hyperkalemia 06/26/2023   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 01/10/2023   Diabetic osteomyelitis (HCC) 12/28/2022   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 to breakdown of skin (HCC) 12/28/2022   Severe protein-calorie malnutrition (HCC) 12/28/2022   Acute on chronic systolic heart failure (HCC) 12/28/2022   Elevated alkaline phosphatase level 12/27/2022   Acute on chronic congestive heart failure (HCC) 12/26/2022   Hyponatremia 11/29/2021   PVD (peripheral vascular disease) (HCC) 11/29/2021   Class 2 obesity 11/29/2021   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) 11/26/2021   Diabetic foot ulcer with osteomyelitis (HCC) 11/26/2021   Essential hypertension 11/26/2021   PCP:  Annett Fabian, MD Pharmacy:   [facility] [address], [place] - [address] [place] [address] Phone: [phone] Fax: [phone]  [facility] [place], [place] - [address] [place] [address] Phone: [phone] Fax: [phone]  [place] [facility] [address] [place] [address] Phone: [phone] Fax: [phone]  [facility] [address] [place] [address] Phone: [phone] Fax: [phone]  [facility] - [place], [place] - [address] [place] [address] Phone: [phone] Fax: [phone]     Social Drivers of Health (SDOH) Social History: SDOH Screenings   Food Insecurity: Food Insecurity Present (09/16/2023)  Housing: Low Risk  (09/16/2023)  Transportation Needs: No Transportation Needs (09/16/2023)  Utilities: Not At Risk (09/16/2023)  Depression (PHQ2-9): Low Risk  (01/17/2023)  Recent Concern: Depression (PHQ2-9) - Medium Risk (01/10/2023)  Financial Resource Strain: High Risk  (01/10/2023)  Social Connections: Unknown (12/15/2021)   [facility] from [facility]  Stress: No Stress Concern Present (02/28/2021)   Received from [facility]  Tobacco Use: Low Risk  (10/08/2023)   SDOH Interventions:     Readmission Risk Interventions    09/17/2023    2:43 PM  Readmission Risk Prevention Plan  Transportation Screening Complete  PCP or Specialist Appt within 5-7 Days Complete  Home Care Screening Complete  Medication Review (RN CM) Complete

## 2023-10-09 NOTE — Progress Notes (Signed)
 Order for PICC noted. Patient disoriented. Unable to reach brother for consent at this time. Primary RN notified.

## 2023-10-09 NOTE — Consult Note (Addendum)
 Palliative Care Consult Note                                  Date: 10/09/2023   Patient Name: Dakota Ramirez  DOB: 1970/12/07  MRN: [id number]  Age / Sex: 53 y.o., male  PCP: Annett Fabian, MD Referring Physician: Charlott Holler, MD  Reason for Consultation: Establishing goals of care  HPI/Patient Profile: 53 y.o. 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 He was found to be hypoxic and eventually required NIPPM. 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  Palliative medicine was consulted for GOC conversations.  Palliative medicine has previously seen this patient during his last admission from 09/16/2023 through 09/20/2023.  At that time the patient was a DNR, requested full scope of care, declined outpatient palliative care services.  Past Medical History:  Diagnosis Date   CHF (congestive heart failure) (HCC)    Diabetes mellitus without complication (HCC)    Type II   Heart failure with reduced ejection fraction (HCC) 11/26/2021   Hypertension    Sepsis with encephalopathy and septic shock (HCC) 06/26/2023   Shock (HCC) 06/26/2023    Subjective:   This NP Wynne Dust reviewed medical records, received report from team, assessed the patient and then meet at the patient's bedside to discuss diagnosis, prognosis, GOC, EOL wishes disposition and options.  I met with the patient at the bedside, although he has severe agitation and subsequent  sedation. I called the patient's brother Nezar Buckles, but there was no answer.  I left a voicemail requesting callback to palliative medicine team.   We meet to discuss diagnosis prognosis, GOC, EOL wishes, disposition and options. Concept of Palliative Care was introduced as specialized medical care for people and their families living with serious illness.  If focuses on providing relief from the symptoms and stress of a serious illness.  The goal is to improve quality of life for both the patient and the family. Values and goals of care important to patient and family were attempted to be elicited.  Created space and opportunity for patient  and family to explore thoughts and feelings regarding current medical situation   Natural trajectory and current clinical status were discussed. Questions and concerns addressed. Patient  encouraged to call with questions or concerns.    Patient/Family Understanding of Illness: The patient is unable to express his understanding due to encephalopathy and sedation.  Last visit in December 2024 the patient understood his acute and chronic conditions, mother had heart failure and passed away 3 years prior.  He was informed of the progressive nature of his disease and recent hospitalizations with likely future rehospitalizations.  See previous palliative notes.  We will further gauge his current and ongoing understanding of the severity of his health status when he is more able to appropriately engage.  Life Review: Deferred copied from previous note: "Prior to admission the patient lived independently using a walking stick. He was eating well. 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 He shares his partner passed away three years ago and he has no children (his step child also passed away)."  Goals: To be determined  Today's Discussion: I spent time discussing the patient's care with the resident, PCCM  physician, and nursing staff.  The patient has been "quite wild today" with agitation and yelling out requiring sedation with Precedex.  He is quite volume overloaded and they are working diligently to get this off.  He is currently on a Lasix drip.  We discussed previous palliative visits to gauge his current understanding.  I discussed that I called and left a voicemail for the patient's brother to try to establish a time to discuss the patient's current situation.  We will hopefully be able to have better conversations and engaged with the patient as he is more awake in coming days.  Review of Systems  Unable to perform ROS: Mental status change    Objective:   Primary Diagnoses: Present on Admission: **None**   Physical Exam Vitals and nursing note reviewed.  Constitutional:      General: He is sleeping. He is not in acute distress.    Interventions: He is sedated.  HENT:     Head: Normocephalic and atraumatic.  Cardiovascular:     Rate and Rhythm: Tachycardia present.  Pulmonary:     Effort: Pulmonary effort is normal. No respiratory distress.  Abdominal:     General: Abdomen is flat.     Vital Signs:  BP 119/80   Pulse (!) 109   Temp 98.2 F (36.8 C) (Oral)   Resp 19   Ht 5\' 11"  (1.803 m)   Wt 118.7 kg   SpO2 99%   BMI 36.50 kg/m   Palliative Assessment/Data: 60-70%    Advanced Care Planning:   Existing Vynca/ACP Documentation: None  Primary Decision Maker: NEXT OF KIN  Code Status/Advance Care Planning: Full code  A discussion was had today regarding advanced directives. Concepts specific to code status, artifical feeding and hydration, continued IV antibiotics and rehospitalization was had.  The difference between a aggressive medical intervention path and a palliative comfort care path for this patient at this time was had.   Decisions/Changes to ACP: None today  Assessment & Plan:   Impression: 53 year old male with acute presentation chronic  comorbidities as described above.  Unfortunately the patient is unable to meaningfully engage today.  I have left a message for patient's family to try to establish a date and time to meet to discuss goals of care.  Hopefully, the patient will wake up more in the coming days and be able to more appropriately engage in conversations.  Unfortunately he appears to be at the very end stages of heart failure.  At this time DNR and hospice are very appropriate options, although this still pending patient's wishes.  Overall prognosis poor.  SUMMARY OF RECOMMENDATIONS   Full code Full scope of care Ongoing attempts at goals of care conversations Palliative medicine will continue to follow  Symptom Management:  Per primary team PMT is available to assist as needed  Prognosis:  Unable to determine  Discharge Planning:  To Be Determined   Discussed with: Medical team, nursing team    Thank you for allowing [place] to participate in the care of [name] PMT will continue to support holistically.  Time Total: 51 min  Detailed review of medical records (labs, imaging,  vital signs), medically appropriate exam, discussed with treatment team, counseling and education to patient, family, & staff, documenting clinical information, medication management, coordination of care  Signed by: Wynne Dust, NP Palliative Medicine Team  Team Phone # [phone] (Nights/Weekends)  10/09/2023, 2:25 PM

## 2023-10-09 NOTE — Progress Notes (Signed)
 eLink Physician-Brief Progress Note Patient Name: Dakota Ramirez DOB: May 26, 1971 MRN: [id number]   Date of Service  10/09/2023  HPI/Events of Note  Was notified that precedex needed to be turned off since BP dropped. Patient very restless. Current MAP is ok off precedex but also needs to be placed on it to tolerate BIPAP. Has severe CHF needing lasix drip as well as ongoing electrolyte replacement. RN also asked if a PICC could be placed due to issues with his veins.   eICU Interventions  Placed order for peripheral levophed in case needed, so we can use precedex. Should likely not need a high dose. Ok to maintain SBP > 85 and Map > 60 as goal in his case. Also ordered PICC line      Intervention Category Major Interventions: Delirium, psychosis, severe agitation - evaluation and management  Oretha Milch 10/09/2023, 7:56 PM

## 2023-10-09 NOTE — Progress Notes (Signed)
 Heart Failure Navigator Progress Note  Assessed for Heart & Vascular TOC clinic readiness.  Patient does not meet criteria due to Advanced Heart Failure Team patient of Dr. Gala Romney. .   Navigator will sign off at this time.   Rhae Hammock, BSN, Scientist, clinical (histocompatibility and immunogenetics) Only

## 2023-10-09 NOTE — Progress Notes (Signed)
 [facility] ADULT ICU REPLACEMENT PROTOCOL   The patient does apply for the [facility] Adult ICU Electrolyte Replacment Protocol based on the criteria listed below:   1.Exclusion criteria: TCTS, ECMO, Dialysis, and Myasthenia Gravis patients 2. Is GFR >/= 30 ml/min? Yes.    Patient's GFR today is >60 3. Is SCr </= 2? Yes.   Patient's SCr is 1.19 mg/dL 4. Did SCr increase >/= 0.5 in 24 hours? No. 5.Pt's weight >40kg  Yes.   6. Abnormal electrolyte(s): potassium 3.5, mag 1.4  7. Electrolytes replaced per protocol 8.  Call MD STAT for K+ </= 2.5, Phos </= 1, or Mag </= 1 Physician:  protocol  Melvern Banker 10/09/2023 4:35 AM

## 2023-10-10 ENCOUNTER — Other Ambulatory Visit: Payer: Self-pay | Admitting: *Deleted

## 2023-10-10 ENCOUNTER — Other Ambulatory Visit: Payer: Self-pay

## 2023-10-10 DIAGNOSIS — M86279 Subacute osteomyelitis, unspecified ankle and foot: Secondary | ICD-10-CM | POA: Diagnosis not present

## 2023-10-10 DIAGNOSIS — E1152 Type 2 diabetes mellitus with diabetic peripheral angiopathy with gangrene: Secondary | ICD-10-CM

## 2023-10-10 DIAGNOSIS — I5023 Acute on chronic systolic (congestive) heart failure: Secondary | ICD-10-CM | POA: Diagnosis not present

## 2023-10-10 DIAGNOSIS — Z7189 Other specified counseling: Secondary | ICD-10-CM | POA: Diagnosis not present

## 2023-10-10 DIAGNOSIS — J9601 Acute respiratory failure with hypoxia: Secondary | ICD-10-CM

## 2023-10-10 DIAGNOSIS — Z515 Encounter for palliative care: Secondary | ICD-10-CM | POA: Diagnosis not present

## 2023-10-10 DIAGNOSIS — J111 Influenza due to unidentified influenza virus with other respiratory manifestations: Secondary | ICD-10-CM | POA: Diagnosis not present

## 2023-10-10 DIAGNOSIS — I5082 Biventricular heart failure: Secondary | ICD-10-CM | POA: Diagnosis not present

## 2023-10-10 DIAGNOSIS — J09X2 Influenza due to identified novel influenza A virus with other respiratory manifestations: Secondary | ICD-10-CM | POA: Diagnosis not present

## 2023-10-10 DIAGNOSIS — J9602 Acute respiratory failure with hypercapnia: Secondary | ICD-10-CM

## 2023-10-10 LAB — BLOOD GAS, VENOUS
Acid-Base Excess: 4 mmol/L — ABNORMAL HIGH (ref 0.0–2.0)
Bicarbonate: 31.6 mmol/L — ABNORMAL HIGH (ref 20.0–28.0)
O2 Saturation: 67.7 %
Patient temperature: 37
pCO2, Ven: 60 mmHg (ref 44–60)
pH, Ven: 7.33 (ref 7.25–7.43)
pO2, Ven: 42 mmHg (ref 32–45)

## 2023-10-10 LAB — BLOOD CULTURE ID PANEL (REFLEXED) - BCID2

## 2023-10-10 LAB — CBC
HCT: 40.9 % (ref 39.0–52.0)
Hemoglobin: 12.5 g/dL — ABNORMAL LOW (ref 13.0–17.0)
MCH: 28 pg (ref 26.0–34.0)
MCHC: 30.6 g/dL (ref 30.0–36.0)
MCV: 91.5 fL (ref 80.0–100.0)
Platelets: 186 10*3/uL (ref 150–400)
RBC: 4.47 MIL/uL (ref 4.22–5.81)
RDW: 22.3 % — ABNORMAL HIGH (ref 11.5–15.5)
WBC: 5.7 10*3/uL (ref 4.0–10.5)
nRBC: 1.1 % — ABNORMAL HIGH (ref 0.0–0.2)

## 2023-10-10 LAB — GLUCOSE, CAPILLARY
Glucose-Capillary: 100 mg/dL — ABNORMAL HIGH (ref 70–99)
Glucose-Capillary: 103 mg/dL — ABNORMAL HIGH (ref 70–99)
Glucose-Capillary: 103 mg/dL — ABNORMAL HIGH (ref 70–99)
Glucose-Capillary: 117 mg/dL — ABNORMAL HIGH (ref 70–99)
Glucose-Capillary: 157 mg/dL — ABNORMAL HIGH (ref 70–99)
Glucose-Capillary: 162 mg/dL — ABNORMAL HIGH (ref 70–99)

## 2023-10-10 LAB — BASIC METABOLIC PANEL
Anion gap: 8 (ref 5–15)
BUN: 36 mg/dL — ABNORMAL HIGH (ref 6–20)
CO2: 29 mmol/L (ref 22–32)
Calcium: 8.1 mg/dL — ABNORMAL LOW (ref 8.9–10.3)
Chloride: 97 mmol/L — ABNORMAL LOW (ref 98–111)
Creatinine, Ser: 1.24 mg/dL (ref 0.61–1.24)
GFR, Estimated: >60 mL/min (ref >60)
Glucose, Bld: 105 mg/dL — ABNORMAL HIGH (ref 70–99)
Potassium: 4.4 mmol/L (ref 3.5–5.1)
Sodium: 134 mmol/L — ABNORMAL LOW (ref 135–145)

## 2023-10-10 LAB — COOXEMETRY PANEL
Carboxyhemoglobin: 1.4 % (ref 0.5–1.5)
Methemoglobin: 0.7 % (ref 0.0–1.5)
O2 Saturation: 68.7 %
Total hemoglobin: 12.3 g/dL (ref 12.0–16.0)

## 2023-10-10 LAB — LACTIC ACID, PLASMA: Lactic Acid, Venous: 1.4 mmol/L (ref 0.5–1.9)

## 2023-10-10 LAB — MAGNESIUM: Magnesium: 2.1 mg/dL (ref 1.7–2.4)

## 2023-10-10 MED ORDER — SODIUM CHLORIDE 0.9% FLUSH
10.0000 mL | Freq: Two times a day (BID) | INTRAVENOUS | Status: DC
  Administered 2023-10-10 – 2023-10-13 (×7): 10 mL

## 2023-10-10 MED ORDER — HYDROMORPHONE HCL 1 MG/ML IJ SOLN
0.4000 mg | Freq: Once | INTRAMUSCULAR | Status: DC
  Filled 2023-10-10 (×2): qty 0.5

## 2023-10-10 MED ORDER — OXYCODONE HCL 5 MG PO TABS
5.0000 mg | ORAL_TABLET | Freq: Four times a day (QID) | ORAL | Status: DC | PRN
  Administered 2023-10-10 – 2023-10-11 (×2): 5 mg via ORAL
  Filled 2023-10-10 (×3): qty 1

## 2023-10-10 MED ORDER — HALOPERIDOL LACTATE 5 MG/ML IJ SOLN
1.0000 mg | Freq: Once | INTRAMUSCULAR | Status: AC
  Administered 2023-10-10: 1 mg via INTRAVENOUS
  Filled 2023-10-10: qty 1

## 2023-10-10 MED ORDER — ACETAMINOPHEN 325 MG PO TABS
650.0000 mg | ORAL_TABLET | Freq: Four times a day (QID) | ORAL | Status: DC | PRN
  Administered 2023-10-11: 650 mg via ORAL
  Filled 2023-10-10: qty 2

## 2023-10-10 MED ORDER — HALOPERIDOL LACTATE 5 MG/ML IJ SOLN: 1.0000 mg | Freq: Four times a day (QID) | INTRAMUSCULAR | Status: DC | PRN

## 2023-10-10 MED ORDER — SODIUM CHLORIDE 0.9% FLUSH: 10.0000 mL | INTRAVENOUS | Status: DC | PRN

## 2023-10-10 NOTE — Progress Notes (Addendum)
 Advanced Heart Failure Rounding Note  Cardiologist: None  Chief Complaint: CHF Exacerbation Subjective:    Net negative 1.5L. Weight down 5 lbs. Still 17lbs above dry weight. On Lasix 30/hr + Diuril 500 mg IV once Agitated overnight, unable to wear Bipap. Remains on Precedex. RASS -1 Cr 1.07>1.24  Light sedation on exam. PICC line placed.   Objective:   Weight Range: 116.4 kg Body mass index is 35.79 kg/m.   Vital Signs:   Temp:  [97.4 F (36.3 C)-98.5 F (36.9 C)] 97.6 F (36.4 C) (02/27 0405) Pulse Rate:  [74-117] 80 (02/27 0845) Resp:  [12-23] 23 (02/27 0845) BP: (74-125)/(46-88) 106/77 (02/27 0845) SpO2:  [90 %-100 %] 100 % (02/27 0845) Weight:  [116.4 kg] 116.4 kg (02/27 0500) Last BM Date :  (PTA) Weight change: Filed Weights   10/08/23 2302 10/09/23 0400 10/10/23 0500  Weight: 122.3 kg 118.7 kg 116.4 kg   Intake/Output:   Intake/Output Summary (Last 24 hours) at 10/10/2023 0919 Last data filed at 10/10/2023 0800 Gross per 24 hour  Intake 1994.63 ml  Output 3935 ml  Net -1940.37 ml    Physical Exam    General: Chronically ill appearing. Malnourished Cardiac: JVP ~12cm. S1 and S2 present.  Resp: Coarse crackles Abdomen: Taut, obese Extremities: B/l ruddy extremities with krylex to feet.  2+ B/L thigh and extremity edema Neuro: RASS -1 Lines/Devices:  RUE PICC  Telemetry   SR in 80s (personally reviewed)  EKG    N/A  Labs    CBC Recent Labs    10/09/23 0236 10/10/23 0255  WBC 6.0 5.7  HGB 12.5* 12.5*  HCT 41.1 40.9  MCV 93.0 91.5  PLT 200 186   Basic Metabolic Panel Recent Labs    16/10/96 0236 10/09/23 1219 10/10/23 0255  NA 137 135 134*  K 3.5 3.3* 4.4  CL 101 96* 97*  CO2 24 27 29   GLUCOSE 113* 165* 105*  BUN 35* 33* 36*  CREATININE 1.19 1.07 1.24  CALCIUM 8.5* 8.3* 8.1*  MG 1.4* 1.6* 2.1  PHOS 3.9  --   --    Liver Function Tests Recent Labs    10/08/23 1506 10/08/23 2159  AST 62* 60*  ALT 46* 43  ALKPHOS  154* 141*  BILITOT 1.0 1.0  PROT 8.5* 8.4*  ALBUMIN 2.5* 2.4*   No results for input(s): "LIPASE", "AMYLASE" in the last 72 hours. Cardiac Enzymes No results for input(s): "CKTOTAL", "CKMB", "CKMBINDEX", "TROPONINI" in the last 72 hours.  BNP: BNP (last 3 results) Recent Labs    07/30/23 0044 08/19/23 1536 10/08/23 1456  BNP 2,303.1* >4,500.0* 2,909.0*   ProBNP (last 3 results) No results for input(s): "PROBNP" in the last 8760 hours.  D-Dimer No results for input(s): "DDIMER" in the last 72 hours. Hemoglobin A1C No results for input(s): "HGBA1C" in the last 72 hours. Fasting Lipid Panel No results for input(s): "CHOL", "HDL", "LDLCALC", "TRIG", "CHOLHDL", "LDLDIRECT" in the last 72 hours. Thyroid Function Tests No results for input(s): "TSH", "T4TOTAL", "T3FREE", "THYROIDAB" in the last 72 hours.  Invalid input(s): "FREET3"  Other results:  Imaging   [place] EKG SITE RITE Result Date: 10/09/2023 If Site Rite image not attached, placement could not be confirmed due to current cardiac rhythm.  Medications:    Scheduled Medications:  amoxicillin-clavulanate  1 tablet Oral Q12H   Chlorhexidine Gluconate Cloth  6 each Topical Daily   digoxin  0.125 mg Oral Daily   enoxaparin (LOVENOX) injection  40 mg Subcutaneous  Q24H   insulin aspart  0-20 Units Subcutaneous Q4H   ipratropium-albuterol  3 mL Nebulization BID   linezolid  600 mg Oral Q12H   oseltamivir  75 mg Oral BID   potassium chloride  40 mEq Oral Daily   sodium chloride flush  10-40 mL Intracatheter Q12H   spironolactone  25 mg Oral Daily    Infusions:  sodium chloride     dexmedetomidine (PRECEDEX) IV infusion 0.9 mcg/kg/hr (10/10/23 0910)   furosemide (LASIX) 200 mg in dextrose 5 % 100 mL (2 mg/mL) infusion 30 mg/hr (10/10/23 0917)   norepinephrine (LEVOPHED) Adult infusion 2 mcg/min (10/10/23 0800)    PRN Medications: docusate sodium, mouth rinse, phenol, polyethylene glycol, sodium chloride  flush  Patient Profile   Dakota Ramirez is a 53 y.o. 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 Admitted with hypoxia and acute on chronic systolic heart failure.   Assessment/Plan    Acute on Chronic End-Stage Systolic Heart Failure - NICM. Reports hx CHF in his mother. No hx alcohol or drug abuse. - R/LHC (4/23): showed normal cors, severe NICM EF 20-25% and mildly elevated filling pressures w/ normal CO - cMRI (4/24): showed severe biventricular failure, LVEF 17%. RVEF 22% LGE imaging very poor quality. RV insertion site LGE (associated with worse prognosis) - Echo 12/24: EF <10% - NYHA IV on admission - Volume overloaded again. Check CVPs - Continue lasix gtt to 30 mg/hr.  - BP too low for ARB/ARNi - Continue digoxin 0.125 mg daily - Continue spiro 25 mg daily - He is end-stage heart failure with recurrent massive volume overload and he has again failed outpatient management with high-dose oral diuretics. Suspect non-compliance is contributing. Poor long-term prognosis. - Not a candidate for advanced therapies given chronic wounds and social situation - We have previously offered Palliative/paramedicine services to him but he is not interested in having people come to his house   2. Hypoxia FLU A + - In the setting of volume overload - RVP + for Flu A - blood cx- NGTD - abx and tamiflu per CCM - not tolerating bipap with agitation    3. Chronic foot wounds Hx osteomyelitis s/p right TMA - In setting of poorly controlled DM - Treated for cellulitis/osteomyelitis recent admit. Given 6 week course po abx at discharge but concerned about compliance.  - He still has b/l foot wounds. Wound Care following.  - ID consulted during last admission, felt no indication for antibiotics. He has been afebrile, no leukocytosis.  - Ortho SX previously recommended bilateral transtibial  amputations. Patient wanted to hold off and schedule outpatient - On Linezolid and Augmentin per CCM - Seen again by Ortho Sx. He is not currently a candidate for transtibial amputations given condition and not sure that the patient would be agreeable.   4. Uncontrolled DM2 - Most recent A1C 10.3 11/24 - Not a good candidate for SGLT2i - continue insulin use   5. SDOH  - USD + for amphetamines - Now has medicaid - Lives in a camper behind father's house. Unemployed and has limited social support. - Has previously declined Paramedicine and Palliative support on multiple occasions - Will consult palliative care team with end-stage heart failure.   Length of Stay: 2  [place] Lee, NP  10/10/2023, 9:19 AM  Advanced Heart Failure Team Pager [phone] (M-F; 7a - 5p)  Please contact [facility] for night-coverage after hours (  5p -7a ) and weekends on [email or web address]   Agree with above.   Lightly sedated due to severe agitation. On NE and lasix gtt.  Agonal breathing. Not responding to me  General:  Critically ill appearing Agonal breathing HEENT: normal Neck: supple.JVP to ear  Cor:  Regular rate & rhythm Lungs: + rhonchi Abdomen: soft, nontender, + distended. Extremities: no cyanosis, clubbing, rash, 2+ edema + wounds  Neuro: unresponsive  He is terminally ill with end-stage HF, influenza and chronic osteo   I know him well. Would strongly recommend transition to comfort care. He has been suffering at home for months with multiple readmits and no way to improve his situation.   D/w CCM.   CRITICAL CARE Performed by: Arvilla Meres  Total critical care time: 40 minutes  Critical care time was exclusive of separately billable procedures and treating other patients.  Critical care was necessary to treat or prevent imminent or life-threatening deterioration.  Critical care was time spent personally by me (independent of midlevel providers or residents) on the following  activities: development of treatment plan with patient and/or surrogate as well as nursing, discussions with consultants, evaluation of patient's response to treatment, examination of patient, obtaining history from patient or surrogate, ordering and performing treatments and interventions, ordering and review of laboratory studies, ordering and review of radiographic studies, pulse oximetry and re-evaluation of patient's condition.  Arvilla Meres, MD  10:24 AM

## 2023-10-10 NOTE — Plan of Care (Signed)
  Problem: Fluid Volume: Goal: Ability to maintain a balanced intake and output will improve Outcome: Progressing   Problem: Metabolic: Goal: Ability to maintain appropriate glucose levels will improve Outcome: Progressing   Problem: Nutritional: Goal: Maintenance of adequate nutrition will improve Outcome: Progressing Goal: Progress toward achieving an optimal weight will improve Outcome: Progressing   Problem: Tissue Perfusion: Goal: Adequacy of tissue perfusion will improve Outcome: Progressing   Problem: Health Behavior/Discharge Planning: Goal: Ability to manage health-related needs will improve Outcome: Progressing   Problem: Clinical Measurements: Goal: Respiratory complications will improve Outcome: Progressing Goal: Cardiovascular complication will be avoided Outcome: Progressing

## 2023-10-10 NOTE — Progress Notes (Signed)
 Peripherally Inserted Central Catheter Placement  The IV Nurse has discussed with the patient and/or persons authorized to consent for the patient, the purpose of this procedure and the potential benefits and risks involved with this procedure.  The benefits include less needle sticks, lab draws from the catheter, and the patient may be discharged home with the catheter. Risks include, but not limited to, infection, bleeding, blood clot (thrombus formation), and puncture of an artery; nerve damage and irregular heartbeat and possibility to perform a PICC exchange if needed/ordered by physician.  Alternatives to this procedure were also discussed.  Bard Power PICC patient education guide, fact sheet on infection prevention and patient information card has been provided to patient /or left at bedside.    PICC Placement Documentation  PICC Triple Lumen 10/10/23 Right Basilic 43 cm 1 cm (Active)  Indication for Insertion or Continuance of Line Vasoactive infusions 10/10/23 0900  Exposed Catheter (cm) 1 cm 10/10/23 0900  Site Assessment Clean, Dry, Intact 10/10/23 0900  Lumen #1 Status Flushed;Saline locked;Blood return noted 10/10/23 0900  Lumen #2 Status Flushed;Saline locked;Blood return noted 10/10/23 0900  Lumen #3 Status Flushed;Saline locked;Blood return noted 10/10/23 0900  Dressing Type Transparent;Securing device 10/10/23 0900  Dressing Status Antimicrobial disc/dressing in place;Clean, Dry, Intact 10/10/23 0900  Line Care Connections checked and tightened 10/10/23 0900  Line Adjustment (NICU/IV Team Only) No 10/10/23 0900  Dressing Intervention New dressing;Adhesive placed at insertion site (IV team only) 10/10/23 0900  Dressing Change Due 10/17/23 10/10/23 0900    Telephone consent signed by brother   Dakota Ramirez 10/10/2023, 9:01 AM

## 2023-10-10 NOTE — Patient Outreach (Signed)
 Dakota Ramirez is currently admitted as an inpatient at [facility]. The [facility] Managed Care team will follow the progress of Dakota Ramirez and follow up upon discharge.   Estanislado Emms RN, BSN [place]  [facility] RN Care Manager [phone]

## 2023-10-10 NOTE — Progress Notes (Addendum)
 PHARMACY - PHYSICIAN COMMUNICATION CRITICAL VALUE ALERT - BLOOD CULTURE IDENTIFICATION (BCID)  Dakota Ramirez is an 53 y.o. male who presented to [facility] on 10/08/2023 with a chief complaint of respiratory failure and CHF.  H/O chronic foot wounds and osteomyelitis   Assessment:   1/2 blood cultures growing Staphylococcus epidermidis  Name of physician (or Provider) Contacted:  Dr. Roxan Hockey  Current antibiotics:  Zyvox and Augmentin  Changes to prescribed antibiotics recommended:  No changes at this time  Results for orders placed or performed during the hospital encounter of 10/08/23  Blood Culture ID Panel (Reflexed) (Collected: 10/08/2023  6:23 PM)  Result Value Ref Range   Enterococcus faecalis NOT DETECTED NOT DETECTED   Enterococcus Faecium NOT DETECTED NOT DETECTED   Listeria monocytogenes NOT DETECTED NOT DETECTED   Staphylococcus species DETECTED (A) NOT DETECTED   Staphylococcus aureus (BCID) NOT DETECTED NOT DETECTED   Staphylococcus epidermidis NOT DETECTED NOT DETECTED   Staphylococcus lugdunensis NOT DETECTED NOT DETECTED   Streptococcus species NOT DETECTED NOT DETECTED   Streptococcus agalactiae NOT DETECTED NOT DETECTED   Streptococcus pneumoniae NOT DETECTED NOT DETECTED   Streptococcus pyogenes NOT DETECTED NOT DETECTED   A.calcoaceticus-baumannii NOT DETECTED NOT DETECTED   Bacteroides fragilis NOT DETECTED NOT DETECTED   Enterobacterales NOT DETECTED NOT DETECTED   Enterobacter cloacae complex NOT DETECTED NOT DETECTED   Escherichia coli NOT DETECTED NOT DETECTED   Klebsiella aerogenes NOT DETECTED NOT DETECTED   Klebsiella oxytoca NOT DETECTED NOT DETECTED   Klebsiella pneumoniae NOT DETECTED NOT DETECTED   Proteus species NOT DETECTED NOT DETECTED   Salmonella species NOT DETECTED NOT DETECTED   Serratia marcescens NOT DETECTED NOT DETECTED   Haemophilus influenzae NOT DETECTED NOT DETECTED   Neisseria meningitidis NOT DETECTED NOT DETECTED    Pseudomonas aeruginosa NOT DETECTED NOT DETECTED   Stenotrophomonas maltophilia NOT DETECTED NOT DETECTED   Candida albicans NOT DETECTED NOT DETECTED   Candida auris NOT DETECTED NOT DETECTED   Candida glabrata NOT DETECTED NOT DETECTED   Candida krusei NOT DETECTED NOT DETECTED   Candida parapsilosis NOT DETECTED NOT DETECTED   Candida tropicalis NOT DETECTED NOT DETECTED   Cryptococcus neoformans/gattii NOT DETECTED NOT DETECTED    [name] 10/10/2023  12:39 AM

## 2023-10-10 NOTE — TOC Initial Note (Addendum)
 Transition of Care [facility]) - Initial/Assessment Note    Patient Details  Name: Dakota Ramirez MRN: [id number] Date of Birth: Dec 31, 1970  Transition of Care [facility]) CM/SW Contact:    Dakota Ramirez Phone Number: [phone] 10/10/2023, 2:20 PM  Clinical Narrative: 1:27 PM- HF CSW attempted to meet with pt at bedside. Pt was being seen by nurse and using bed pan. CSW will follow up with pt at a mote appropriate time.   2:55 PM-  Pt was being seen by medical team.  CSW will follow up with pt at a mote appropriate time.   HF CSW met with pt and his brother, Dakota Ramirez at bedside. Pt appeared uncomfortable and in pain. Pt attempted to speak through pain with CSW but it appeared difficult for him to speak. Pts brother stepped into assist answering questions. Pt stated that he has a PCP, but did not make it to the last hospital follow up appointment. Pt stated that he does not want any HH services coming inside his home. Pt stated that he is still using his walking stick. Pt does not use scale. CSW explained the importance of compliance and that a hospital follow up appt. Will be scheduled closer to dc.   TOC will continue following.                         Patient Goals and CMS Choice            Expected Discharge Plan and Services                                              Prior Living Arrangements/Services                       Activities of Daily Living   ADL Screening (condition at time of admission) Independently performs ADLs?: Yes (appropriate for developmental age) Is the patient deaf or have difficulty hearing?: No Does the patient have difficulty seeing, even when wearing glasses/contacts?: No Does the patient have difficulty concentrating, remembering, or making decisions?: No  Permission Sought/Granted                  Emotional Assessment              Admission diagnosis:  CHF (congestive heart failure) (HCC) [I50.9] Hypoxia  [R09.02] Influenza [J11.1] Acute congestive heart failure, unspecified heart failure type (HCC) [I50.9] Patient Active Problem List   Diagnosis Date Noted   Hypoxia 10/09/2023   CHF (congestive heart failure) (HCC) 10/08/2023   Subacute osteomyelitis of foot (HCC) 09/20/2023   Acute on chronic systolic CHF (congestive heart failure) (HCC) 09/16/2023   Acute on chronic heart failure (HCC) 07/30/2023   Non-ischemic cardiomyopathy (HCC) 07/01/2023   Congestive hepatopathy 06/30/2023   Bacteremia 06/30/2023   Mitral valve vegetation 06/30/2023   Hypoalbuminemia due to protein-calorie malnutrition (HCC) 06/30/2023   Non-adherence to medical treatment 06/30/2023   AKI (acute kidney injury) (HCC) 06/26/2023   Hyperkalemia 06/26/2023   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 01/10/2023   Diabetic osteomyelitis (HCC) 12/28/2022   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 to breakdown of skin (HCC) 12/28/2022   Severe protein-calorie malnutrition (HCC) 12/28/2022   Acute on chronic systolic heart failure (HCC) 12/28/2022   Elevated alkaline phosphatase  level 12/27/2022   Acute on chronic congestive heart failure (HCC) 12/26/2022   Hyponatremia 11/29/2021   PVD (peripheral vascular disease) (HCC) 11/29/2021   Class 2 obesity 11/29/2021   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) 11/26/2021   Diabetic foot ulcer with osteomyelitis (HCC) 11/26/2021   Essential hypertension 11/26/2021   PCP:  Dakota Fabian, MD Ramirez:   [facility] [address], [place] - [address] [place] [address] Phone: [phone] Fax: [phone]  [facility] (Dakota) Dakota Ramirez, [place] - [address] [place] [address] Phone: [phone] Fax: [phone]  Dakota Ramirez [address] [place] [address] Phone: [phone] Fax:  [phone]  Dakota Ramirez LONG - [facility] Ramirez [address] [place] [address] Phone: [phone] Fax: [phone]  [facility] - [place], [place] - [address] [place] [address] Phone: [phone] Fax: [phone]     Social Drivers of Health (SDOH) Social History: SDOH Screenings   Food Insecurity: Food Insecurity Present (09/16/2023)  Housing: Low Risk  (09/16/2023)  Transportation Needs: No Transportation Needs (09/16/2023)  Utilities: Not At Risk (09/16/2023)  Depression (PHQ2-9): Low Risk  (01/17/2023)  Recent Concern: Depression (PHQ2-9) - Medium Risk (01/10/2023)  Financial Resource Strain: High Risk (01/10/2023)  Social Connections: Unknown (12/15/2021)   Received from [facility]  Stress: No Stress Concern Present (02/28/2021)   Received from [facility], [facility]  Tobacco Use: Low Risk  (10/08/2023)   SDOH Interventions:     Readmission Risk Interventions    09/17/2023    2:43 PM  Readmission Risk Prevention Plan  Transportation Screening Complete  PCP or Specialist Appt within 5-7 Days Complete  Home Care Screening Complete  Medication Review (RN CM) Complete

## 2023-10-10 NOTE — Consult Note (Signed)
 ORTHOPAEDIC CONSULTATION  REQUESTING PHYSICIAN: Raechel Chute, MD  Chief Complaint: Chronic ulceration and chronic osteomyelitis both feet.  HPI: Dakota Ramirez is a 53 y.o. male who presents with acute respiratory collapse.  Patient has a history of congestive heart failure type 2 diabetes septic shock and chronic ulceration with chronic osteomyelitis both feet.  Past Medical History:  Diagnosis Date   CHF (congestive heart failure) (HCC)    Diabetes mellitus without complication (HCC)    Type II   Heart failure with reduced ejection fraction (HCC) 11/26/2021   Hypertension    Sepsis with encephalopathy and septic shock (HCC) 06/26/2023   Shock (HCC) 06/26/2023   Past Surgical History:  Procedure Laterality Date   AMPUTATION Left 01/25/2023   Procedure: LEFT GREAT TOE AMPUTATION AND DEBRIDEMENT OF HEEL;  Surgeon: Nadara Mustard, MD;  Location: [facility] OR;  Service: Orthopedics;  Laterality: Left;   BACK SURGERY     CHOLECYSTECTOMY     RIGHT/LEFT HEART CATH AND CORONARY ANGIOGRAPHY N/A 11/30/2021   Procedure: RIGHT/LEFT HEART CATH AND CORONARY ANGIOGRAPHY;  Surgeon: Dolores Patty, MD;  Location: [facility] INVASIVE CV LAB;  Service: Cardiovascular;  Laterality: N/A;   TOE AMPUTATION Right    all toes on RT foot   TRANSESOPHAGEAL ECHOCARDIOGRAM (CATH LAB) N/A 07/01/2023   Procedure: TRANSESOPHAGEAL ECHOCARDIOGRAM;  Surgeon: Romie Minus, MD;  Location: [facility] INVASIVE CV LAB;  Service: Cardiovascular;  Laterality: N/A;   Social History   Socioeconomic History   Marital status: Single    Spouse name: Not on file   Number of children: Not on file   Years of education: Not on file   Highest education level: Not on file  Occupational History   Not on file  Tobacco Use   Smoking status: Never   Smokeless tobacco: Never  Vaping Use   Vaping status: Never Used  Substance and Sexual Activity   Alcohol use: Not Currently    Comment: rare   Drug use: Never   Sexual activity: Not  on file  Other Topics Concern   Not on file  Social History Narrative   Not on file   Social Drivers of Health   Financial Resource Strain: High Risk (01/10/2023)   Overall Financial Resource Strain (CARDIA)    Difficulty of Paying Living Expenses: Hard  Food Insecurity: Food Insecurity Present (09/16/2023)   Hunger Vital Sign    Worried About Running Out of Food in the Last Year: Never true    Ran Out of Food in the Last Year: Sometimes true  Transportation Needs: No Transportation Needs (09/16/2023)   PRAPARE - Administrator, Civil Service (Medical): No    Lack of Transportation (Non-Medical): No  Physical Activity: Not on file  Stress: No Stress Concern Present (02/28/2021)   Received from [facility] - Occupational Stress Questionnaire    Feeling of Stress : Not at all  Social Connections: Unknown (12/15/2021)   [facility] from [facility], [facility]   Social Network    Social Network: Not on file   Family History  Problem Relation Age of Onset   Diabetes Mother    Hypertension Mother    - negative except otherwise stated in the family history section No Known Allergies Prior to Admission medications   Medication Sig Start Date End Date Taking? Authorizing Provider  digoxin (LANOXIN) 0.125 MG tablet Take 1 tablet (0.125 mg total) by mouth daily. 08/19/23  Yes Bensimhon, Bevelyn Buckles, MD  FUROSCIX 80 MG/10ML CTKT Inject into the skin. Patient not taking: Reported on 10/01/2023 08/09/23   [provider]  magnesium oxide (MAGOX 400) 400 (240 Mg) MG tablet Take 1 tablet (400 mg total) by mouth daily at 12 noon. 09/20/23   Romie Minus, MD  metolazone (ZAROXOLYN) 5 MG tablet Take 1 tablet (5 mg total) by mouth daily as needed (as instructed by HF clinic for extra volume). Take an extra 2 potassium tablets with your Metolazone Patient not taking: Reported on 10/01/2023 09/20/23   Romie Minus, MD   NOVOLOG MIX 70/30 FLEXPEN (70-30) 100 UNIT/ML FlexPen Inject 30 Units into the skin 2 (two) times daily with a meal. 07/01/23   Rocky Morel, DO  potassium chloride SA (KLOR-CON M) 20 MEQ tablet Take 2 tablets (40 mEq total) by mouth daily. Take extra 40 meq on day you take metolazone. Take extra 40 mg on days you use Furoxcix. 08/04/23   Katheran James, DO  sacubitril-valsartan (ENTRESTO) 24-26 MG Take 1 tablet by mouth 2 (two) times daily. 08/04/23   Katheran James, DO  spironolactone (ALDACTONE) 25 MG tablet Take 1 tablet (25 mg total) by mouth daily. 08/04/23   Katheran James, DO  torsemide (DEMADEX) 20 MG tablet Take 4 tablets (80 mg total) by mouth 2 (two) times daily. 09/20/23   Romie Minus, MD   [place] EKG SITE RITE Result Date: 10/09/2023 If [facility] image not attached, placement could not be confirmed due to current cardiac rhythm.  DG Chest Portable 1 View Result Date: 10/08/2023 CLINICAL DATA:  Shortness of breath and hypoxia. EXAM: PORTABLE CHEST 1 VIEW COMPARISON:  07/30/2023 FINDINGS: Shallow inspiration. Cardiac enlargement with mild pulmonary vascular congestion. Patchy infiltrates suggested in the right lung base may represent atelectasis or pneumonia. No pleural effusion or pneumothorax. Mediastinal contours appear intact. Old rib fractures. IMPRESSION: Cardiac enlargement. Patchy infiltration or atelectasis in the right base. Electronically Signed   By: Burman Nieves M.D.   On: 10/08/2023 16:02   - pertinent xrays, CT, MRI studies were reviewed and independently interpreted  Positive ROS: All other systems have been reviewed and were otherwise negative with the exception of those mentioned in the HPI and as above.  Physical Exam: General: Not alert, in acute distress Psychiatric: Patient is not competent for consent with inability to communicate. Lymphatic: No axillary or cervical lymphadenopathy Cardiovascular: Congestive heart failure. Respiratory:  Respiratory collapse. GI: No organomegaly, abdomen is soft and non-tender    Images:  @ENCIMAGES @  Labs:  Lab Results  Component Value Date   HGBA1C 10.3 (H) 06/26/2023   HGBA1C 11.2 (H) 12/26/2022   HGBA1C 12.4 (H) 11/26/2021   ESRSEDRATE 68 (H) 12/28/2022   CRP 5.6 (H) 12/28/2022   REPTSTATUS PENDING 10/08/2023   CULT  10/08/2023    NO GROWTH 2 DAYS Performed at [facility], [address], [place], [place] [address]     Lab Results  Component Value Date   ALBUMIN 2.4 (L) 10/08/2023   ALBUMIN 2.5 (L) 10/08/2023   ALBUMIN 2.3 (L) 09/16/2023        Latest Ref Rng & Units 10/10/2023    2:55 AM 10/09/2023    2:36 AM 10/08/2023   11:35 PM  CBC EXTENDED  WBC 4.0 - 10.5 K/uL 5.7  6.0    RBC 4.22 - 5.81 MIL/uL 4.47  4.42    Hemoglobin 13.0 - 17.0 g/dL 84.6  96.2  95.2   HCT  39.0 - 52.0 % 40.9  41.1  40.0   Platelets 150 - 400 K/uL 186  200      Neurologic: Patient does not have protective sensation bilateral lower extremities.   MUSCULOSKELETAL:   Skin: Examination patient has chronic ulceration to the left heel and right forefoot.  Patient has Wagner grade 3 ulceration in both feet.  There is no ascending cellulitis.  Review of the MRI scan shows the chronic osteomyelitis of the left calcaneus and chronic osteomyelitis of the residual metatarsals in the right forefoot.  Patient is in respiratory collapse he is sitting upright in bed grasping for air unable to communicate.  Hemoglobin A1c shows chronic uncontrolled type 2 diabetes.  Assessment: Assessment: Respiratory collapse with stable chronic osteomyelitis of both feet with Wagner grade 3 ulceration both feet.  Plan: Plan: Patient would require bilateral transtibial amputations.  Patient is in significant medical decline and would be unable to tolerate surgery or consent for surgery.  The ulceration on both feet have been present for years and surgical intervention would be elective once patient is  medically stable.  Thank you for the consult and the opportunity to see Mr. Bashar Milam, MD [facility] [phone] 7:37 AM

## 2023-10-10 NOTE — Plan of Care (Signed)

## 2023-10-10 NOTE — Progress Notes (Signed)
 Daily Progress Note   Patient Name: Dakota Ramirez       Date: 10/10/2023 DOB: 1971/01/04  Age: 53 y.o. MRN#: [id number] Attending Physician: Raechel Chute, MD Primary Care Physician: Annett Fabian, MD Admit Date: 10/08/2023 Length of Stay: 2 days  Reason for Consultation/Follow-up: Establishing goals of care  HPI/Patient Profile:  53 y.o. 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 He was found to be hypoxic and eventually required NIPPM. 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   Palliative medicine was consulted for GOC conversations.   Palliative medicine has previously seen this patient during his last admission from 09/16/2023 through 09/20/2023.  At that time the patient was a DNR, requested full scope of care, declined outpatient palliative care services.  Subjective:   Subjective: Chart Reviewed. Updates received. Patient Assessed. Created space and opportunity for patient  and family to explore thoughts and feelings regarding current medical situation.  Today's Discussion: Today before seeing the patient I spent an extensive amount of time reviewing the chart, getting updates, speaking with the medical team and nursing teams.  The patient became significantly agitated overnight and required initiation of Precedex, although he was having hypotension.  An order was put in for low-dose Levophed to keep his blood pressure stable while allowing him to be sedated because of severe agitation.   Currently is not requiring Precedex.  Per nursing he is still quite sedated and not interactive.  When I am in the room the patient was sedated, did not open to voice and so I did not elect to aggressively try to wake him because of his history of severe agitation.  I did reach out and try to contact his brother per contact information in the facesheet, but was unsuccessful.  Review of Systems  Unable to perform ROS   Objective:   Vital Signs:  BP 106/77   Pulse 80   Temp 97.6 F (36.4 C) (Axillary)   Resp (!) 23   Ht 5\' 11"  (1.803 m)   Wt 116.4 kg   SpO2 100%   BMI 35.79 kg/m   Physical Exam Vitals and nursing note reviewed.  Constitutional:      General: He is sleeping. He is not in acute distress.    Interventions: He is sedated.  HENT:     Head: Normocephalic and atraumatic.  Cardiovascular:     Rate and Rhythm: Normal rate.  Pulmonary:     Effort: Pulmonary effort is normal. No respiratory distress.  Abdominal:     General: Abdomen is flat.  Skin:    General: Skin is warm and dry.     Palliative Assessment/Data: TBD - sedated    Existing Vynca/ACP Documentation: None  Assessment & Plan:   Impression: Present on Admission:  Subacute osteomyelitis of foot (HCC)  53 year old male with acute presentation chronic comorbidities as described above.  Unfortunately the patient is unable to meaningfully engage today.  I have left a message for patient's family to try to establish a date and time to meet to discuss goals of care.  Hopefully, the patient will wake up more in the coming days and be able to more appropriately engage in conversations.  Unfortunately he appears to be at the very end stages of heart failure.  At this time DNR and hospice are very appropriate options, although this still pending patient's wishes.  Overall prognosis poor.    SUMMARY OF RECOMMENDATIONS   Full code Full scope of care Ongoing attempts at goals of care  conversations Palliative medicine will continue to follow  Symptom Management:  Per primary team PMD is available to assist as needed  Code Status: Full code  Prognosis: Unable to determine  Discharge Planning: To Be Determined  Discussed with: Medical team, nursing team, pharmacy  Thank you for allowing [place] to participate in the care of Dakota Ramirez PMT will continue to support holistically.  Time Total: 42 min  Detailed review of medical records (labs, imaging, vital signs), medically appropriate exam, discussed with treatment team, counseling and education to patient, family, & staff, documenting clinical information, medication management, coordination of care  Wynne Dust, NP Palliative Medicine Team  Team Phone # [phone] (Nights/Weekends)  04/11/2021, 8:17 AM

## 2023-10-10 NOTE — Plan of Care (Signed)
  Problem: Education: Goal: Ability to describe self-care measures that may prevent or decrease complications (Diabetes Survival Skills Education) will improve Outcome: Progressing Goal: Individualized Educational Video(s) Outcome: Progressing   Problem: Coping: Goal: Ability to adjust to condition or change in health will improve Outcome: Progressing   Problem: Fluid Volume: Goal: Ability to maintain a balanced intake and output will improve Outcome: Progressing   Problem: Metabolic: Goal: Ability to maintain appropriate glucose levels will improve Outcome: Progressing   Problem: Skin Integrity: Goal: Risk for impaired skin integrity will decrease Outcome: Progressing   Problem: Tissue Perfusion: Goal: Adequacy of tissue perfusion will improve Outcome: Progressing   Problem: Health Behavior/Discharge Planning: Goal: Ability to manage health-related needs will improve Outcome: Progressing   Problem: Clinical Measurements: Goal: Ability to maintain clinical measurements within normal limits will improve Outcome: Progressing Goal: Will remain free from infection Outcome: Progressing Goal: Diagnostic test results will improve Outcome: Progressing Goal: Respiratory complications will improve Outcome: Progressing Goal: Cardiovascular complication will be avoided Outcome: Progressing   Problem: Activity: Goal: Risk for activity intolerance will decrease Outcome: Progressing   Problem: Nutrition: Goal: Adequate nutrition will be maintained Outcome: Progressing   Problem: Coping: Goal: Level of anxiety will decrease Outcome: Progressing   Problem: Elimination: Goal: Will not experience complications related to bowel motility Outcome: Progressing Goal: Will not experience complications related to urinary retention Outcome: Progressing

## 2023-10-10 NOTE — Progress Notes (Addendum)
 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 Unfortunately not much further history was able to be obtained the patient was lethargic, hypoxic with pulse oximetry in the 70s to 80s.  He appeared acutely ill.    He was initially placed on nasal cannula but remained quite lethargic white blood cell count was normal, troponin 25 respiratory viral panel was positive for influenza A sodium was 131, bicarbonate 16 BUN 36 creatinine 1.25 up from 1.06 venous blood gas was obtained which showed a pH of 7.18, pCO2 of 62, HCO3 of 23. He was placed on noninvasive positive pressure ventilation, antibiotics were initiated, advanced heart failure was consulted and IV diuresis was initiated as well. Critical care was asked to admit.   Recently hospitalized January 2025 for acute on chronic heart failure aggressively diuresed over 50 pounds discharged to home with weight 259.   Pertinent  Medical History  nd-stage heart failure (EF 20 to 25%), not a candidate for advanced therapies, poorly controlled diabetes, prior right TMA osteomyelitis, hypertension, medical noncompliance   Significant Hospital Events: Including procedures, antibiotic start and stop dates in addition to other pertinent events   2/25 admitted to PCCM, BiPAP overnight 2/26 Off BiPAP AM. IV Lasix gtt 30 mg/ hr and IV Diuril 500 mg x 1. Digoxin 0.125 mg and Spiro 25 mg daily. Tamiflu. Started Precedex for agitation and overnight on Levophed.  2/27 blood culture grew Staphylococcus epidermidis   Interim History / Subjective:  Agitated,  groaning.  Unable to assess orientation Not answering questions appropriately   Objective   Blood pressure 106/77, pulse 80, temperature 98.4 F (36.9 C), temperature source Oral, resp. rate (!) 23, height 5\' 11"  (1.803 m), weight 116.4 kg, SpO2 100%.        Intake/Output Summary (Last 24 hours) at 10/10/2023 1338 Last data filed at 10/10/2023 1330 Gross per 24 hour  Intake 1672.16 ml  Output 2995 ml  Net -1322.84 ml   Filed Weights   10/08/23 2302 10/09/23 0400 10/10/23 0500  Weight: 122.3 kg 118.7 kg 116.4 kg    Examination: General: Obese, agitated, no acute distress  HENT: AT/[place] on nasal cannula  Lungs: Decrease breath sounds lower lung fields Cardiovascular: +JVD, RR.  Abdomen: soft, ND.  Extremities: +RLE erythema, Left toe amputation, R metatarsal amputation. Ulcers are dressed. Images in chart.  Neuro: Unable to assess. Agitated.  GU: Foley Cath Skin: Cold and wet  Resolved Hospital Problem list   N/A   Assessment & Plan:  Acute hypoxic/Hypercarbic respiratory failure  2/2 Acute on Chronic end stage [biventricular] HF, exacerbation, EF < 10% Volume overload  Influenza A  Agitated, Altered. Family meeting to discuss GOC.  - VBG 7.33/60/42/31.6 - IV Lasix gtt 30 mg/ hr  - Digoxin 0.125 mg daily - Spiro 25 mg daily - Tamiflu 75 mg daily for 5 days  - Duonebs BID   - Strict Is & Os and daily weight checks - Replete electrolytes with goal K > 4.0 and Mg > 2.0    Acute on Chronic Osteomyelitis  Open foot wounds Hx of  R metatarsal amputation  Hx of L toe amputation Poorly controlled T2DM RLE Cellulitis  MRI R foot and L foot on 07/30/2023 concerning for osteomyelitis.  - Orthopedics evaluated, recommend elective surgery once patient can consent   - PO Zyvox 600 mg BID and IV Augmentin BID - Would care  - Lactate 1.4 - Blood culture 2/25 grew Staph capitis - likely contaminate   Polysubstance Abuse Encephalopathy  UDS positive for Amphetamines and THC.  Agitated, tachycardiac, dry heaves.  - Encephalopathic/delirious  - IV Precedex  - PRN Haldol 1 mg  q6h  - PRN Tylenol 650 mg q6h - PRN Oxycodone 5 mg q6h  - Daily QTC check with EKG   Uncontrolled T2DM - SSI   Best Practice (right click and "Reselect all SmartList Selections" daily)   Diet/type: Heart healthy  DVT prophylaxis Lovenox  Pressure ulcer(s): Assessed by RN  GI prophylaxis: N/A Lines: PICC  Foley:  Yes, and it is still needed Code Status:  full code Last date of multidisciplinary goals of care discussion [Spoke with Elliot Gurney, brother, today; plan for family meeting with Eddie tomorrow]  Labs   CBC: Recent Labs  Lab 10/08/23 1456 10/08/23 1528 10/08/23 1823 10/08/23 1837 10/08/23 2335 10/09/23 0236 10/10/23 0255  WBC 4.4  --  3.9*  --   --  6.0 5.7  HGB 13.6   < > 13.7 16.3 13.6 12.5* 12.5*  HCT 44.0   < > 45.2 48.0 40.0 41.1 40.9  MCV 93.2  --  96.0  --   --  93.0 91.5  PLT 246  --  204  --   --  200 186   < > = values in this interval not displayed.    Basic Metabolic Panel: Recent Labs  Lab 10/08/23 1506 10/08/23 1528 10/08/23 1823 10/08/23 1837 10/08/23 2335 10/09/23 0236 10/09/23 1219 10/10/23 0255  NA 131*   < >  --  136 136 137 135 134*  K 4.6   < >  --  4.4 3.5 3.5 3.3* 4.4  CL 100  --   --   --   --  101 96* 97*  CO2 16*  --   --   --   --  24 27 29   GLUCOSE 165*  --   --   --   --  113* 165* 105*  BUN 36*  --   --   --   --  35* 33* 36*  CREATININE 1.25*  --  1.40*  --   --  1.19 1.07 1.24  CALCIUM 8.8*  --   --   --   --  8.5* 8.3* 8.1*  MG 1.5*  --   --   --   --  1.4* 1.6* 2.1  PHOS  --   --   --   --   --  3.9  --   --    < > = values in this interval not displayed.   GFR: Estimated Creatinine Clearance: 91.4 mL/min (by C-G formula based on SCr of 1.24 mg/dL). Recent Labs  Lab 10/08/23 1456 10/08/23 1823 10/08/23 1837 10/08/23 1957 10/09/23 0236 10/10/23 0255 10/10/23 1230  PROCALCITON  --   --   --   --  0.20  --   --    WBC 4.4 3.9*  --   --  6.0 5.7  --   LATICACIDVEN  --   --  4.0* 3.6*  --   --  1.4  Liver Function Tests: Recent Labs  Lab 10/08/23 1506 10/08/23 2159  AST 62* 60*  ALT 46* 43  ALKPHOS 154* 141*  BILITOT 1.0 1.0  PROT 8.5* 8.4*  ALBUMIN 2.5* 2.4*   No results for input(s): "LIPASE", "AMYLASE" in the last 168 hours. No results for input(s): "AMMONIA" in the last 168 hours.  ABG    Component Value Date/Time   PHART 7.299 (L) 10/08/2023 2335   PCO2ART 52.0 (H) 10/08/2023 2335   PO2ART 116 (H) 10/08/2023 2335   HCO3 31.6 (H) 10/10/2023 1241   TCO2 27 10/08/2023 2335   ACIDBASEDEF 2.0 10/08/2023 2335   O2SAT 68.7 10/10/2023 1241   O2SAT 67.7 10/10/2023 1241     Coagulation Profile: Recent Labs  Lab 10/08/23 1519  INR 1.4*    Cardiac Enzymes: No results for input(s): "CKTOTAL", "CKMB", "CKMBINDEX", "TROPONINI" in the last 168 hours.  HbA1C: Hgb A1c MFr Bld  Date/Time Value Ref Range Status  06/26/2023 03:00 AM 10.3 (H) 4.8 - 5.6 % Final    Comment:    (NOTE) Pre diabetes:          5.7%-6.4%  Diabetes:              >6.4%  Glycemic control for   <7.0% adults with diabetes   12/26/2022 11:32 AM 11.2 (H) 4.8 - 5.6 % Final    Comment:    (NOTE) Pre diabetes:          5.7%-6.4%  Diabetes:              >6.4%  Glycemic control for   <7.0% adults with diabetes     CBG: Recent Labs  Lab 10/09/23 1949 10/10/23 0015 10/10/23 0404 10/10/23 0726 10/10/23 1250  GLUCAP 127* 103* 103* 100* 157*    Review of Systems:   As above   Past Medical History:  He,  has a past medical history of CHF (congestive heart failure) (HCC), Diabetes mellitus without complication (HCC), Heart failure with reduced ejection fraction (HCC) (11/26/2021), Hypertension, Sepsis with encephalopathy and septic shock (HCC) (06/26/2023), and Shock (HCC) (06/26/2023).   Surgical History:   Past Surgical History:  Procedure Laterality Date   AMPUTATION Left 01/25/2023    Procedure: LEFT GREAT TOE AMPUTATION AND DEBRIDEMENT OF HEEL;  Surgeon: Nadara Mustard, MD;  Location: [facility] OR;  Service: Orthopedics;  Laterality: Left;   BACK SURGERY     CHOLECYSTECTOMY     RIGHT/LEFT HEART CATH AND CORONARY ANGIOGRAPHY N/A 11/30/2021   Procedure: RIGHT/LEFT HEART CATH AND CORONARY ANGIOGRAPHY;  Surgeon: Dolores Patty, MD;  Location: [facility] INVASIVE CV LAB;  Service: Cardiovascular;  Laterality: N/A;   TOE AMPUTATION Right    all toes on RT foot   TRANSESOPHAGEAL ECHOCARDIOGRAM (CATH LAB) N/A 07/01/2023   Procedure: TRANSESOPHAGEAL ECHOCARDIOGRAM;  Surgeon: Romie Minus, MD;  [facility] INVASIVE CV LAB;  Service: Cardiovascular;  Laterality: N/A;     Social History:   reports that he has never smoked. He has never used smokeless tobacco. He reports that he does not currently use alcohol. He reports that he does not use drugs.   Family History:  His family history includes Diabetes in his mother; Hypertension in his mother.   Allergies No Known Allergies   Home Medications  Prior to Admission medications   Medication Sig Start Date End Date Taking? Authorizing Provider  digoxin (LANOXIN) 0.125 MG tablet Take 1 tablet (0.125 mg total) by mouth daily. 08/19/23  Yes Bensimhon, Bevelyn Buckles, MD  FUROSCIX 80 MG/10ML CTKT Inject into the skin. Patient not taking: Reported on 10/01/2023 08/09/23   [provider]  magnesium oxide (MAGOX 400) 400 (240 Mg) MG tablet Take 1 tablet (400 mg total) by mouth daily at 12 noon. 09/20/23   Romie Minus, MD  metolazone (ZAROXOLYN) 5 MG tablet Take 1 tablet (5 mg total) by mouth daily as needed (as instructed by HF clinic for extra volume). Take an extra 2 potassium tablets with your Metolazone Patient not taking: Reported on 10/01/2023 09/20/23   Romie Minus, MD  NOVOLOG MIX 70/30 FLEXPEN (70-30) 100 UNIT/ML FlexPen Inject 30 Units into the skin 2 (two) times daily with a meal. 07/01/23   Rocky Morel, DO  potassium  chloride SA (KLOR-CON M) 20 MEQ tablet Take 2 tablets (40 mEq total) by mouth daily. Take extra 40 meq on day you take metolazone. Take extra 40 mg on days you use Furoxcix. 08/04/23   Katheran James, DO  sacubitril-valsartan (ENTRESTO) 24-26 MG Take 1 tablet by mouth 2 (two) times daily. 08/04/23   Katheran James, DO  spironolactone (ALDACTONE) 25 MG tablet Take 1 tablet (25 mg total) by mouth daily. 08/04/23   Katheran James, DO  torsemide (DEMADEX) 20 MG tablet Take 4 tablets (80 mg total) by mouth 2 (two) times daily. 09/20/23   Romie Minus, MD     Critical care time:

## 2023-10-11 DIAGNOSIS — J09X2 Influenza due to identified novel influenza A virus with other respiratory manifestations: Secondary | ICD-10-CM | POA: Diagnosis not present

## 2023-10-11 DIAGNOSIS — I5023 Acute on chronic systolic (congestive) heart failure: Secondary | ICD-10-CM | POA: Diagnosis not present

## 2023-10-11 DIAGNOSIS — J9601 Acute respiratory failure with hypoxia: Secondary | ICD-10-CM | POA: Diagnosis not present

## 2023-10-11 DIAGNOSIS — I5082 Biventricular heart failure: Secondary | ICD-10-CM | POA: Diagnosis not present

## 2023-10-11 DIAGNOSIS — M86279 Subacute osteomyelitis, unspecified ankle and foot: Secondary | ICD-10-CM | POA: Diagnosis not present

## 2023-10-11 LAB — BASIC METABOLIC PANEL
Anion gap: 12 (ref 5–15)
BUN: 42 mg/dL — ABNORMAL HIGH (ref 6–20)
CO2: 30 mmol/L (ref 22–32)
Calcium: 8.1 mg/dL — ABNORMAL LOW (ref 8.9–10.3)
Chloride: 95 mmol/L — ABNORMAL LOW (ref 98–111)
Creatinine, Ser: 1.16 mg/dL (ref 0.61–1.24)
GFR, Estimated: >60 mL/min (ref >60)
Glucose, Bld: 132 mg/dL — ABNORMAL HIGH (ref 70–99)
Potassium: 3.9 mmol/L (ref 3.5–5.1)
Sodium: 137 mmol/L (ref 135–145)

## 2023-10-11 LAB — GLUCOSE, CAPILLARY
Glucose-Capillary: 131 mg/dL — ABNORMAL HIGH (ref 70–99)
Glucose-Capillary: 138 mg/dL — ABNORMAL HIGH (ref 70–99)
Glucose-Capillary: 188 mg/dL — ABNORMAL HIGH (ref 70–99)

## 2023-10-11 LAB — CBC
HCT: 40.9 % (ref 39.0–52.0)
Hemoglobin: 12.9 g/dL — ABNORMAL LOW (ref 13.0–17.0)
MCH: 28.4 pg (ref 26.0–34.0)
MCHC: 31.5 g/dL (ref 30.0–36.0)
MCV: 89.9 fL (ref 80.0–100.0)
Platelets: 155 10*3/uL (ref 150–400)
RBC: 4.55 MIL/uL (ref 4.22–5.81)
RDW: 22.2 % — ABNORMAL HIGH (ref 11.5–15.5)
WBC: 4 10*3/uL (ref 4.0–10.5)
nRBC: 0.5 % — ABNORMAL HIGH (ref 0.0–0.2)

## 2023-10-11 LAB — CULTURE, BLOOD (ROUTINE X 2): Special Requests: ADEQUATE

## 2023-10-11 LAB — MAGNESIUM: Magnesium: 1.6 mg/dL — ABNORMAL LOW (ref 1.7–2.4)

## 2023-10-11 MED ORDER — ACETAMINOPHEN 325 MG PO TABS: 650.0000 mg | ORAL_TABLET | Freq: Four times a day (QID) | ORAL | Status: DC | PRN

## 2023-10-11 MED ORDER — ONDANSETRON 4 MG PO TBDP: 4.0000 mg | ORAL_TABLET | Freq: Four times a day (QID) | ORAL | Status: DC | PRN

## 2023-10-11 MED ORDER — MORPHINE BOLUS VIA INFUSION: 5.0000 mg | INTRAVENOUS | Status: DC | PRN

## 2023-10-11 MED ORDER — OXYCODONE HCL 5 MG PO TABS
10.0000 mg | ORAL_TABLET | Freq: Four times a day (QID) | ORAL | Status: DC | PRN
  Administered 2023-10-11: 10 mg via ORAL
  Filled 2023-10-11: qty 2

## 2023-10-11 MED ORDER — ACETAMINOPHEN 650 MG RE SUPP: 650.0000 mg | Freq: Four times a day (QID) | RECTAL | Status: DC | PRN

## 2023-10-11 MED ORDER — MAGNESIUM SULFATE 4 GM/100ML IV SOLN
4.0000 g | Freq: Once | INTRAVENOUS | Status: AC
  Administered 2023-10-11: 4 g via INTRAVENOUS
  Filled 2023-10-11: qty 100

## 2023-10-11 MED ORDER — FUROSEMIDE 10 MG/ML IJ SOLN
30.0000 mg/h | INTRAVENOUS | Status: DC
  Administered 2023-10-11 – 2023-10-12 (×4): 30 mg/h via INTRAVENOUS
  Filled 2023-10-11 (×7): qty 20

## 2023-10-11 MED ORDER — IPRATROPIUM-ALBUTEROL 0.5-2.5 (3) MG/3ML IN SOLN: 3.0000 mL | Freq: Four times a day (QID) | RESPIRATORY_TRACT | Status: DC | PRN

## 2023-10-11 MED ORDER — ONDANSETRON HCL 4 MG/2ML IJ SOLN: 4.0000 mg | Freq: Four times a day (QID) | INTRAMUSCULAR | Status: DC | PRN

## 2023-10-11 MED ORDER — LORAZEPAM 2 MG/ML IJ SOLN
2.0000 mg | INTRAMUSCULAR | Status: DC | PRN
  Administered 2023-10-11: 2 mg via INTRAVENOUS
  Filled 2023-10-11: qty 1

## 2023-10-11 MED ORDER — ALBUTEROL SULFATE (2.5 MG/3ML) 0.083% IN NEBU: 2.5000 mg | INHALATION_SOLUTION | RESPIRATORY_TRACT | Status: DC | PRN

## 2023-10-11 MED ORDER — IPRATROPIUM-ALBUTEROL 0.5-2.5 (3) MG/3ML IN SOLN
3.0000 mL | Freq: Four times a day (QID) | RESPIRATORY_TRACT | Status: DC
  Administered 2023-10-11 – 2023-10-12 (×4): 3 mL via RESPIRATORY_TRACT
  Filled 2023-10-11 (×4): qty 3

## 2023-10-11 MED ORDER — POLYVINYL ALCOHOL 1.4 % OP SOLN: 1.0000 [drp] | Freq: Four times a day (QID) | OPHTHALMIC | Status: DC | PRN

## 2023-10-11 MED ORDER — GLYCOPYRROLATE 0.2 MG/ML IJ SOLN: 0.2000 mg | INTRAMUSCULAR | Status: DC | PRN

## 2023-10-11 MED ORDER — MORPHINE 100MG IN NS 100ML (1MG/ML) PREMIX INFUSION
0.0000 mg/h | INTRAVENOUS | Status: DC
Start: 2023-10-11 — End: 2023-10-13
  Administered 2023-10-11: 5 mg/h via INTRAVENOUS
  Filled 2023-10-11 (×2): qty 100

## 2023-10-11 MED ORDER — DIPHENHYDRAMINE HCL 50 MG/ML IJ SOLN: 25.0000 mg | INTRAMUSCULAR | Status: DC | PRN

## 2023-10-11 MED ORDER — GLYCOPYRROLATE 1 MG PO TABS: 1.0000 mg | ORAL_TABLET | ORAL | Status: DC | PRN

## 2023-10-11 MED ORDER — HALOPERIDOL LACTATE 5 MG/ML IJ SOLN: 2.5000 mg | INTRAMUSCULAR | Status: DC | PRN

## 2023-10-11 MED ORDER — HYDROMORPHONE HCL 1 MG/ML IJ SOLN
0.5000 mg | INTRAMUSCULAR | Status: DC | PRN
  Administered 2023-10-11: 0.5 mg via INTRAVENOUS
  Filled 2023-10-11: qty 0.5

## 2023-10-11 NOTE — Progress Notes (Addendum)
 Advanced Heart Failure Rounding Note  Cardiologist: None  Chief Complaint: CHF Exacerbation Subjective:    Net negative 2.7L. Weight unchanged.  On Lasix 30/hr. Continues to be intermittently agitated. Requiring haldol.  Light sedation on exam.   Objective:   Weight Range: 116.2 kg Body mass index is 35.73 kg/m.   Vital Signs:   Temp:  [97.5 F (36.4 C)-98.4 F (36.9 C)] 97.7 F (36.5 C) (02/28 0419) Pulse Rate:  [68-130] 70 (02/28 0738) Resp:  [10-26] 13 (02/28 0738) BP: (84-112)/(57-77) 104/70 (02/28 0700) SpO2:  [85 %-100 %] 99 % (02/28 0738) Weight:  [116.2 kg] 116.2 kg (02/28 0413) Last BM Date :  (PTA) Weight change: Filed Weights   10/09/23 0400 10/10/23 0500 10/11/23 0413  Weight: 118.7 kg 116.4 kg 116.2 kg   Intake/Output:   Intake/Output Summary (Last 24 hours) at 10/11/2023 0741 Last data filed at 10/11/2023 0700 Gross per 24 hour  Intake 2353.86 ml  Output 5100 ml  Net -2746.14 ml    Physical Exam    CVP 24 General: Acute on chronically appearing. No distress on [place] Cardiac: JVP to jaw. S1 and S2 present.  Extremities: BLE ruddy, red.  2-3+ edema. Krylex to wounds Neuro: RASS -1  Lines/Devices:  RUE PICC  Telemetry   SR in 70s (personally reviewed)  EKG    SR 70 bpm  Labs    CBC Recent Labs    10/10/23 0255 10/11/23 0412  WBC 5.7 4.0  HGB 12.5* 12.9*  HCT 40.9 40.9  MCV 91.5 89.9  PLT 186 155   Basic Metabolic Panel Recent Labs    16/10/96 0236 10/09/23 1219 10/10/23 0255 10/11/23 0412  NA 137   < > 134* 137  K 3.5   < > 4.4 3.9  CL 101   < > 97* 95*  CO2 24   < > 29 30  GLUCOSE 113*   < > 105* 132*  BUN 35*   < > 36* 42*  CREATININE 1.19   < > 1.24 1.16  CALCIUM 8.5*   < > 8.1* 8.1*  MG 1.4*   < > 2.1 1.6*  PHOS 3.9  --   --   --    < > = values in this interval not displayed.   Liver Function Tests Recent Labs    10/08/23 1506 10/08/23 2159  AST 62* 60*  ALT 46* 43  ALKPHOS 154* 141*  BILITOT 1.0 1.0   PROT 8.5* 8.4*  ALBUMIN 2.5* 2.4*   No results for input(s): "LIPASE", "AMYLASE" in the last 72 hours. Cardiac Enzymes No results for input(s): "CKTOTAL", "CKMB", "CKMBINDEX", "TROPONINI" in the last 72 hours.  BNP: BNP (last 3 results) Recent Labs    07/30/23 0044 08/19/23 1536 10/08/23 1456  BNP 2,303.1* >4,500.0* 2,909.0*   ProBNP (last 3 results) No results for input(s): "PROBNP" in the last 8760 hours.  D-Dimer No results for input(s): "DDIMER" in the last 72 hours. Hemoglobin A1C No results for input(s): "HGBA1C" in the last 72 hours. Fasting Lipid Panel No results for input(s): "CHOL", "HDL", "LDLCALC", "TRIG", "CHOLHDL", "LDLDIRECT" in the last 72 hours. Thyroid Function Tests No results for input(s): "TSH", "T4TOTAL", "T3FREE", "THYROIDAB" in the last 72 hours.  Invalid input(s): "FREET3"  Other results:  Imaging   No results found.  Medications:    Scheduled Medications:  amoxicillin-clavulanate  1 tablet Oral Q12H   Chlorhexidine Gluconate Cloth  6 each Topical Daily   digoxin  0.125 mg Oral  Daily   enoxaparin (LOVENOX) injection  40 mg Subcutaneous Q24H    HYDROmorphone (DILAUDID) injection  0.4 mg Intravenous Once   insulin aspart  0-20 Units Subcutaneous Q4H   linezolid  600 mg Oral Q12H   oseltamivir  75 mg Oral BID   sodium chloride flush  10-40 mL Intracatheter Q12H   spironolactone  25 mg Oral Daily    Infusions:  dexmedetomidine (PRECEDEX) IV infusion 0.7 mcg/kg/hr (10/11/23 0700)   furosemide (LASIX) 200 mg in dextrose 5 % 100 mL (2 mg/mL) infusion 30 mg/hr (10/11/23 0700)   magnesium sulfate bolus IVPB      PRN Medications: acetaminophen, docusate sodium, haloperidol lactate, ipratropium-albuterol, mouth rinse, oxyCODONE, phenol, polyethylene glycol, sodium chloride flush  Patient Profile   Dakota Ramirez is a 53 y.o. male with history of uncontrolled DM2, s/p R TMA 07/22 d/t osteomyelitis, HTN, noncompliance with medical therapy and  chronic systolic heart failure. Admitted with hypoxia and acute on chronic systolic heart failure.   Assessment/Plan    Acute on Chronic End-Stage Systolic Heart Failure - NICM. Reports hx CHF in his mother. No hx alcohol or drug abuse. - R/LHC (4/23): showed normal cors, severe NICM EF 20-25% and mildly elevated filling pressures w/ normal CO - cMRI (4/24): showed severe biventricular failure, LVEF 17%. RVEF 22% LGE imaging very poor quality. RV insertion site LGE (associated with worse prognosis) - Echo 12/24: EF <10% - NYHA IV on admission - Continue lasix gtt to 30 mg/hr.  - BP too low for ARB/ARNi - Continue digoxin 0.125 mg daily - Continue spiro 25 mg daily - He is terminally ill with end-stage heart failure and recurrent massive volume overload and he has again failed outpatient management with high-dose oral diuretics. - Continue palliative discussions, recommend comfort care  2. Hypoxia FLU A + - In the setting of volume overload - RVP + for Flu A - blood cx- NGTD - abx and tamiflu per CCM - not tolerating bipap with agitation    3. Chronic foot wounds Hx osteomyelitis s/p right TMA - In setting of poorly controlled DM - Treated for cellulitis/osteomyelitis recent admit. Given 6 week course po abx at discharge but concerned about compliance.  - He still has b/l foot wounds. Wound Care following.  - ID consulted during last admission, felt no indication for antibiotics. He has been afebrile, no leukocytosis.  - Ortho SX previously recommended bilateral transtibial amputations. Patient wanted to hold off and schedule outpatient - On Linezolid and Augmentin per CCM - Seen again by Ortho Sx. He is not currently a candidate for transtibial amputations given condition and not sure that the patient would be agreeable.   4. Uncontrolled DM2 - Most recent A1C 10.3 11/24 - Not a good candidate for SGLT2i - continue insulin use   5. SDOH  - USD + for amphetamines - Now has  medicaid - Lives in a camper behind father's house. Unemployed and has limited social support. - Has previously declined Paramedicine and Palliative support on multiple occasions  Patient well known to AHF team. Recommend transition to comfort care. He has been suffering at home for months with multiple readmits and no way to improve his situation.   Length of Stay: 3  [place] Lee, NP  10/11/2023, 7:41 AM  Advanced Heart Failure Team Pager [phone] (M-F; 7a - 5p)  Please contact [facility] for night-coverage after hours (5p -7a ) and weekends on [email or web address]  Agree with above  More alert today but still  falls asleep quickly. Remains on lasix gtt. Good volume removal.   General:  Ill appearing. No resp difficulty HEENT: normal Neck: supple. JVP to jaw Cor: reg tachy + s3 Lungs: clear Abdomen: soft, nontender, nondistended. No hepatosplenomegaly. No bruits or masses. Good bowel sounds. Extremities: no cyanosis, clubbing, rash, 2+ edema ice cold. + wounds Neuro: more alert but falls asleep quickly  I know him well from clinic. He has end-stage HF and multiple readmits for severe HF. His only option for recovery would be transplant but he is not a candidate. We have discussed Hospice multiple times but he does not want anyone coming to his house.   I told him today that there is no heart medicine that will keep him out of the hospital and that he will keep coming back until he dies from HF. The only I have to offer him is comfort and control over his end of lif situation.   D/w CCM. Family meeting today.   Continue IV diuresis. We will see him again on Monday unless called over the weekend.   CRITICAL CARE Performed by: Arvilla Meres  Total critical care time: 40 minutes  Critical care time was exclusive of separately billable procedures and treating other patients.  Critical care was necessary to treat or prevent imminent or life-threatening deterioration.  Critical care  was time spent personally by me (independent of midlevel providers or residents) on the following activities: development of treatment plan with patient and/or surrogate as well as nursing, discussions with consultants, evaluation of patient's response to treatment, examination of patient, obtaining history from patient or surrogate, ordering and performing treatments and interventions, ordering and review of laboratory studies, ordering and review of radiographic studies, pulse oximetry and re-evaluation of patient's condition.  Arvilla Meres, MD  9:40 AM

## 2023-10-11 NOTE — TOC Progression Note (Signed)
 Transition of Care [facility]) - Progression Note    Patient Details  Name: Dakota Ramirez MRN: [id number] Date of Birth: 1970-09-08  Transition of Care [facility]) CM/SW Contact  Nicanor Bake Phone Number: [phone] 10/11/2023, 2:41 PM  Clinical Narrative:   HF CSW was notified by NP via secure chat that the pts family is requesting IP [facility] as it is close to family.    HF CSW called and spoke with Keka [phone], [facility] Rep. Rae Halsted stated that a nurse will be out 3/1 to evaluate pt.   TOC will continue following.     Expected Discharge Plan: Home/Self Care Barriers to Discharge: Continued Medical Work up  Expected Discharge Plan and Services       Living arrangements for the past 2 months: Mobile Home                                       Social Determinants of Health (SDOH) Interventions SDOH Screenings   Food Insecurity: Patient Unable To Answer (10/10/2023)  Recent Concern: Food Insecurity - Food Insecurity Present (09/16/2023)  Housing: Patient Unable To Answer (10/10/2023)  Transportation Needs: Patient Unable To Answer (10/10/2023)  Utilities: Patient Unable To Answer (10/10/2023)  Depression (PHQ2-9): Low Risk  (01/17/2023)  Recent Concern: Depression (PHQ2-9) - Medium Risk (01/10/2023)  Financial Resource Strain: High Risk (01/10/2023)  Social Connections: Unknown (12/15/2021)   Received from [facility]  Stress: No Stress Concern Present (02/28/2021)   Received from [facility]  Tobacco Use: Low Risk  (10/08/2023)    Readmission Risk Interventions    09/17/2023    2:43 PM  Readmission Risk Prevention Plan  Transportation Screening Complete  PCP or Specialist Appt within 5-7 Days Complete  Home Care Screening Complete  Medication Review (RN CM) Complete

## 2023-10-11 NOTE — Progress Notes (Addendum)
 Daily Progress Note   Patient Name: Dakota Ramirez       Date: 10/11/2023 DOB: 1970-12-07  Age: 53 y.o. MRN#: [id number] Attending Physician: Raechel Chute, MD Primary Care Physician: Annett Fabian, MD Admit Date: 10/08/2023  Reason for Consultation/Follow-up: Establishing goals of care   Length of Stay: 3  Current Medications: Scheduled Meds:   amoxicillin-clavulanate  1 tablet Oral Q12H   Chlorhexidine Gluconate Cloth  6 each Topical Daily   digoxin  0.125 mg Oral Daily   enoxaparin (LOVENOX) injection  40 mg Subcutaneous Q24H    HYDROmorphone (DILAUDID) injection  0.4 mg Intravenous Once   insulin aspart  0-20 Units Subcutaneous Q4H   linezolid  600 mg Oral Q12H   oseltamivir  75 mg Oral BID   sodium chloride flush  10-40 mL Intracatheter Q12H   spironolactone  25 mg Oral Daily    Continuous Infusions:  dexmedetomidine (PRECEDEX) IV infusion 0.7 mcg/kg/hr (10/11/23 0800)   furosemide (LASIX) 200 mg in dextrose 5 % 100 mL (2 mg/mL) infusion 30 mg/hr (10/11/23 0800)    PRN Meds: docusate sodium, haloperidol lactate, HYDROmorphone (DILAUDID) injection, ipratropium-albuterol, mouth rinse, oxyCODONE, phenol, polyethylene glycol, sodium chloride flush  Physical Exam HENT:     Head: Normocephalic and atraumatic.  Cardiovascular:     Rate and Rhythm: Normal rate.  Pulmonary:     Effort: Pulmonary effort is normal.  Skin:    General: Skin is warm and dry.  Neurological:     Mental Status: He is alert and oriented to person, place, and time.             Vital Signs: BP 98/66   Pulse 75   Temp 97.7 F (36.5 C) (Oral)   Resp 14   Ht 5\' 11"  (1.803 m)   Wt 116.2 kg   SpO2 90%   BMI 35.73 kg/m  SpO2: SpO2: 90 % O2 Device: O2 Device: Nasal Cannula O2 Flow Rate: O2 Flow  Rate (L/min): 3 L/min    Patient Active Problem List   Diagnosis Date Noted   Influenza 10/10/2023   Acute respiratory failure with hypoxia and hypercapnia (HCC) 10/10/2023   Type 2 diabetes mellitus with diabetic peripheral angiopathy with gangrene (HCC) 10/10/2023   Hypoxia 10/09/2023   CHF (congestive heart failure) (HCC) 10/08/2023   Subacute osteomyelitis of  foot (HCC) 09/20/2023   Acute on chronic systolic CHF (congestive heart failure) (HCC) 09/16/2023   Acute on chronic heart failure (HCC) 07/30/2023   Non-ischemic cardiomyopathy (HCC) 07/01/2023   Congestive hepatopathy 06/30/2023   Bacteremia 06/30/2023   Mitral valve vegetation 06/30/2023   Hypoalbuminemia due to protein-calorie malnutrition (HCC) 06/30/2023   Non-adherence to medical treatment 06/30/2023   AKI (acute kidney injury) (HCC) 06/26/2023   Hyperkalemia 06/26/2023   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 01/10/2023   Diabetic osteomyelitis (HCC) 12/28/2022   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 to breakdown of skin (HCC) 12/28/2022   Severe protein-calorie malnutrition (HCC) 12/28/2022   Acute on chronic systolic heart failure (HCC) 12/28/2022   Elevated alkaline phosphatase level 12/27/2022   Acute on chronic congestive heart failure (HCC) 12/26/2022   Hyponatremia 11/29/2021   PVD (peripheral vascular disease) (HCC) 11/29/2021   Class 2 obesity 11/29/2021   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) 11/26/2021   Diabetic foot ulcer with osteomyelitis (HCC) 11/26/2021   Essential hypertension 11/26/2021    Palliative Care Assessment & Plan   Patient Profile: 53 y.o. 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 He was found  to be hypoxic and eventually required NIPPM. 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   Palliative medicine was consulted for GOC conversations.   Palliative medicine has previously seen this patient during his last admission from 09/16/2023 through 09/20/2023.  At that time the patient was a DNR, requested full scope of care, declined outpatient palliative care services.  Today's Discussion: Met with Dr. Merrilee Jansky and patient's family (brother Leavy Heatherly, two good friends, and cousin) in the family room. Family shared their understanding of the patient's chronic conditions and acute hospitalization. Dr. Merrilee Jansky shared her understanding of his heart failure including limitations of ongoing interventions and high risk for further decline despite aggressive treatment efforts. Family understand. They share that the patient has been suffering for some time. When his cousin asked what she could get him yesterday he responded, "death."   Dr. Merrilee Jansky shared options moving forward including continuing with aggressive treatments or transitioning to comfort. They would like to proceed with comfort measures. Shared that the patient would no longer receive aggressive medical interventions such as continuous vital signs, lab work, radiology testing, or medications not focused on comfort. All care would focus on how the patient is looking and feeling. This would include management of any symptoms that may cause discomfort, pain, shortness of breath, cough, nausea, agitation, anxiety, and/or secretions etc. Symptoms would be managed with medications and other non-pharmacological interventions. Family verbalized understanding  and appreciation. They would like to start comfort measures.  We discussed location of comfort measures moving forward. Discussed hospice philosophy and options. They believe inpatient hospice would be most appropriate and family would be able to visit him. We discussed that hospice must evaluate the patient for eligibility. Agree to see how the patient does today on comfort measures then place TOC order.  We then went to  discuss with the patient. Patient seemed to have a good understanding of his prognosis. He would like comfort measures and inpatient hospice close to home [facility]).  Recommendations/Plan: DNR Full comfort measures Comfort medications per primary team Hopeful for discharge to inpatient hospice- [facility] Continued PMT support    Code Status:    Code Status Orders  (From admission, onward)           Start     Ordered   10/08/23 1754  Full code  Continuous       Question:  By:  Answer:  Default: patient does not have capacity for decision making, no surrogate or prior directive available   10/08/23 1756         Extensive chart review has been completed prior to seeing the patient including labs, vital signs, imaging, progress/consult notes, orders, medications, and available advance directive documents.   Care plan was discussed with Dr. Aundria Rud, Dr. Merrilee Jansky, and bedside RN  Time spent: 80 minutes  Thank you for allowing the Palliative Medicine Team to assist in the care of this patient.    Sherryll Burger, NP  Please contact Palliative Medicine Team phone at [phone] for questions and concerns.

## 2023-10-11 NOTE — IPAL (Signed)
  Interdisciplinary Goals of Care Family Meeting   Date carried out: 10/11/2023  Location of the meeting: Bedside/ Family meeting Room   Member's involved: Physician, Bedside Registered Nurse, Family Member or next of kin, Palliative care team member, and Other: patient's friends and cousin  Durable Power of Pensions consultant or acting medical decision maker: Eddie, patient's brother    Discussion: We discussed goals of care for [name] with family initially separately at the family meeting room. I spoke with Juliane Lack, patient's cousin, Morton Peters (friend) and Soil scientist (palliative care). I talked about Tery (patient)'s current medical conditions and current clinical condition to the family at present. They understand about patient's end stage heart failure and current conditions. Eddie notes that the patient has been suffering at home for years. Link Snuffer states that patient has expressed at multiple of occasion that he wants to die. Family understands and wishes for patient's comfort. Dawn (palliative care) presented options of inpatient and outpatient hospice care. Family expressed there interest with inpatient house where he can be close to family and friends. Eddie also confirmed DNR/DNI.   We, again, had similar conversation with patient at bedside. He understands his current condition. Pate wants to be comfortable, free of pain and stop any further life prolonging medical interventions.   With these changes in considerations, patient is now transitioned to DNR/DNI with comfort care.   Code status:   Code Status: Do not attempt resuscitation (DNR) - Comfort care   Disposition: In-patient comfort care  Time spent for the meeting: 40-50 mins    Javeah Loeza, DO  10/11/2023, 12:18 PM

## 2023-10-11 NOTE — Progress Notes (Addendum)
 NAME:  Dakota Ramirez, MRN:  [id number], DOB:  1971/05/27, LOS: 3 ADMISSION DATE:  10/08/2023, CONSULTATION DATE:  10/08/2023 REFERRING MD:  Lockie Mola- ED , CHIEF COMPLAINT:  ARF   History of Present Illness:  53 year old white male with a history uncontrolled DM2, s/p R TMA 07/22 d/t osteomyelitis, HTN, noncompliance with medical therapy, end-stage systolic heart failure with EF 20 to 25% initially presented to IMTS clinic with CC of SOB and not feeling well. Unfortunately not much further history was able to be obtained the patient was lethargic, hypoxic with pulse oximetry in the 70s to 80s.  He appeared acutely ill.    He was initially placed on nasal cannula but remained quite lethargic white blood cell count was normal, troponin 25 respiratory viral panel was positive for influenza A sodium was 131, bicarbonate 16 BUN 36 creatinine 1.25 up from 1.06 venous blood gas was obtained which showed a pH of 7.18, pCO2 of 62, HCO3 of 23. He was placed on noninvasive positive pressure ventilation, antibiotics were initiated, advanced heart failure was consulted and IV diuresis was initiated as well. Critical care was asked to admit.   Recently hospitalized January 2025 for acute on chronic heart failure aggressively diuresed over 50 pounds discharged to home with weight 259.   Pertinent  Medical History  End-stage heart failure (EF 20 to 25%), not a candidate for advanced therapies, poorly controlled diabetes, prior right TMA osteomyelitis, hypertension, medical noncompliance   Significant Hospital Events: Including procedures, antibiotic start and stop dates in addition to other pertinent events   2/25 admitted to PCCM, BiPAP overnight 2/26 Off BiPAP AM. IV Lasix gtt 30 mg/ hr and IV Diuril 500 mg x 1. Digoxin 0.125 mg and Spiro 25 mg daily. Tamiflu. Started Precedex for agitation and overnight on Levophed.  2/27 blood culture grew Staphylococcus epidermidis  2/28 Family meeting today. DNR/DNI with  comfort care  Interim History / Subjective:  Laying in bed, appears in pain and agitated.  Objective   Blood pressure 104/70, pulse 70, temperature 97.7 F (36.5 C), temperature source Oral, resp. rate 13, height 5\' 11"  (1.803 m), weight 116.2 kg, SpO2 99%. CVP:  [16 mmHg-21 mmHg] 17 mmHg      Intake/Output Summary (Last 24 hours) at 10/11/2023 0802 Last data filed at 10/11/2023 0700 Gross per 24 hour  Intake 2308.95 ml  Output 4935 ml  Net -2626.05 ml   Filed Weights   10/09/23 0400 10/10/23 0500 10/11/23 0413  Weight: 118.7 kg 116.4 kg 116.2 kg    Examination: General: Obese, agitated. Appears in pain.   HENT: AT/[place] on nasal cannula  Lungs: Decrease breath sounds lower lung fields Cardiovascular: +JVD, RR.  Abdomen: soft, ND.  Extremities: +bilateral feet dressings.   Neuro: Unable to assess. Agitated.  GU: Foley Cath Skin: Cold and wet  Resolved Hospital Problem list   N/A  Assessment & Plan:  Acute hypoxic/Hypercarbic respiratory failure  2/2 Acute on Chronic end stage [biventricular] HF, exacerbation, EF < 10% Volume overload  Influenza A  Acute on Chronic Osteomyelitis  Open foot wounds Hx of R metatarsal amputation  Hx of L toe amputation Poorly controlled T2DM RLE Cellulitis  Polysubstance Abuse Encephalopathy  Uncontrolled T2DM   We had a family conservation with the patient about GOC. Patient has decided comfort care. We will optimize to transition to comfort care orders and stop any life-prolonging measures.  Comfort Care  - As needed Tylenol 60 mg every 6 hours for pain - As needed  Ativan 2 to 4 mg, IV, every 4 hours as needed for anxiety and agitation - IV morphine infusion for pain - As needed morphine 5 mg every 4 hours as needed for uncontrolled pain, distress - Benadryl 25 mg, IV, every 4 hours as needed for itching - Glycopyrrolate every 4 hours as needed for excessive secretions - Zofran 4 mg, IV or oral every 6 hours as needed for  nausea - Breathing treatments as needed - Comfort food, no dietary restrictions - Digoxin 0.125 mg daily   - Will continue IV Lasix 30 mg gtt until patient is transferred to inpatient hospice facility. - TOC and Palliative care on board   Best Practice (right click and "Reselect all SmartList Selections" daily)   Diet/type: Regular consistency (see orders) DVT prophylaxis not indicated Pressure ulcer(s): N/A GI prophylaxis: N/A Lines: PICC Foley:  Yes, and it is still needed Code Status:  DNR with comfort care  Last date of multidisciplinary goals of care discussion [IPAL]  Labs   CBC: Recent Labs  Lab 10/08/23 1456 10/08/23 1528 10/08/23 1823 10/08/23 1837 10/08/23 2335 10/09/23 0236 10/10/23 0255 10/11/23 0412  WBC 4.4  --  3.9*  --   --  6.0 5.7 4.0  HGB 13.6   < > 13.7 16.3 13.6 12.5* 12.5* 12.9*  HCT 44.0   < > 45.2 48.0 40.0 41.1 40.9 40.9  MCV 93.2  --  96.0  --   --  93.0 91.5 89.9  PLT 246  --  204  --   --  200 186 155   < > = values in this interval not displayed.    Basic Metabolic Panel: Recent Labs  Lab 10/08/23 1506 10/08/23 1528 10/08/23 1823 10/08/23 1837 10/08/23 2335 10/09/23 0236 10/09/23 1219 10/10/23 0255 10/11/23 0412  NA 131*   < >  --    < > 136 137 135 134* 137  K 4.6   < >  --    < > 3.5 3.5 3.3* 4.4 3.9  CL 100  --   --   --   --  101 96* 97* 95*  CO2 16*  --   --   --   --  24 27 29 30   GLUCOSE 165*  --   --   --   --  113* 165* 105* 132*  BUN 36*  --   --   --   --  35* 33* 36* 42*  CREATININE 1.25*  --  1.40*  --   --  1.19 1.07 1.24 1.16  CALCIUM 8.8*  --   --   --   --  8.5* 8.3* 8.1* 8.1*  MG 1.5*  --   --   --   --  1.4* 1.6* 2.1 1.6*  PHOS  --   --   --   --   --  3.9  --   --   --    < > = values in this interval not displayed.   GFR: Estimated Creatinine Clearance: 97.7 mL/min (by C-G formula based on SCr of 1.16 mg/dL). Recent Labs  Lab 10/08/23 1823 10/08/23 1837 10/08/23 1957 10/09/23 0236 10/10/23 0255  10/10/23 1230 10/11/23 0412  PROCALCITON  --   --   --  0.20  --   --   --   WBC 3.9*  --   --  6.0 5.7  --  4.0  LATICACIDVEN  --  4.0* 3.6*  --   --  1.4  --     Liver Function Tests: Recent Labs  Lab 10/08/23 1506 10/08/23 2159  AST 62* 60*  ALT 46* 43  ALKPHOS 154* 141*  BILITOT 1.0 1.0  PROT 8.5* 8.4*  ALBUMIN 2.5* 2.4*   No results for input(s): "LIPASE", "AMYLASE" in the last 168 hours. No results for input(s): "AMMONIA" in the last 168 hours.  ABG    Component Value Date/Time   PHART 7.299 (L) 10/08/2023 2335   PCO2ART 52.0 (H) 10/08/2023 2335   PO2ART 116 (H) 10/08/2023 2335   HCO3 31.6 (H) 10/10/2023 1241   TCO2 27 10/08/2023 2335   ACIDBASEDEF 2.0 10/08/2023 2335   O2SAT 68.7 10/10/2023 1241   O2SAT 67.7 10/10/2023 1241     Coagulation Profile: Recent Labs  Lab 10/08/23 1519  INR 1.4*    Cardiac Enzymes: No results for input(s): "CKTOTAL", "CKMB", "CKMBINDEX", "TROPONINI" in the last 168 hours.  HbA1C: Hgb A1c MFr Bld  Date/Time Value Ref Range Status  06/26/2023 03:00 AM 10.3 (H) 4.8 - 5.6 % Final    Comment:    (NOTE) Pre diabetes:          5.7%-6.4%  Diabetes:              >6.4%  Glycemic control for   <7.0% adults with diabetes   12/26/2022 11:32 AM 11.2 (H) 4.8 - 5.6 % Final    Comment:    (NOTE) Pre diabetes:          5.7%-6.4%  Diabetes:              >6.4%  Glycemic control for   <7.0% adults with diabetes     CBG: Recent Labs  Lab 10/10/23 1712 10/10/23 1956 10/11/23 0009 10/11/23 0415 10/11/23 0726  GLUCAP 117* 162* 188* 138* 131*    Review of Systems:   As above   Past Medical History:  He,  has a past medical history of CHF (congestive heart failure) (HCC), Diabetes mellitus without complication (HCC), Heart failure with reduced ejection fraction (HCC) (11/26/2021), Hypertension, Sepsis with encephalopathy and septic shock (HCC) (06/26/2023), and Shock (HCC) (06/26/2023).   Surgical History:   Past  Surgical History:  Procedure Laterality Date   AMPUTATION Left 01/25/2023   Procedure: LEFT GREAT TOE AMPUTATION AND DEBRIDEMENT OF HEEL;  Surgeon: Nadara Mustard, MD;  Location: [facility] OR;  Service: Orthopedics;  Laterality: Left;   BACK SURGERY     CHOLECYSTECTOMY     RIGHT/LEFT HEART CATH AND CORONARY ANGIOGRAPHY N/A 11/30/2021   Procedure: RIGHT/LEFT HEART CATH AND CORONARY ANGIOGRAPHY;  Surgeon: Dolores Patty, MD;  Location: [facility] INVASIVE CV LAB;  Service: Cardiovascular;  Laterality: N/A;   TOE AMPUTATION Right    all toes on RT foot   TRANSESOPHAGEAL ECHOCARDIOGRAM (CATH LAB) N/A 07/01/2023   Procedure: TRANSESOPHAGEAL ECHOCARDIOGRAM;  Surgeon: Romie Minus, MD;  Location: [facility] INVASIVE CV LAB;  Service: Cardiovascular;  Laterality: N/A;     Social History:   reports that he has never smoked. He has never used smokeless tobacco. He reports that he does not currently use alcohol. He reports that he does not use drugs.   Family History:  His family history includes Diabetes in his mother; Hypertension in his mother.   Allergies No Known Allergies   Home Medications  Prior to Admission medications   Medication Sig Start Date End Date Taking? Authorizing Provider  digoxin (LANOXIN) 0.125 MG tablet Take 1 tablet (0.125 mg total) by mouth daily. 08/19/23  Yes Bensimhon, Bevelyn Buckles, MD  FUROSCIX 80 MG/10ML CTKT Inject into the skin. Patient not taking: Reported on 10/01/2023 08/09/23   [provider]  magnesium oxide (MAGOX 400) 400 (240 Mg) MG tablet Take 1 tablet (400 mg total) by mouth daily at 12 noon. 09/20/23   Romie Minus, MD  metolazone (ZAROXOLYN) 5 MG tablet Take 1 tablet (5 mg total) by mouth daily as needed (as instructed by HF clinic for extra volume). Take an extra 2 potassium tablets with your Metolazone Patient not taking: Reported on 10/01/2023 09/20/23   Romie Minus, MD  NOVOLOG MIX 70/30 FLEXPEN (70-30) 100 UNIT/ML FlexPen Inject 30 Units into the  skin 2 (two) times daily with a meal. 07/01/23   Rocky Morel, DO  potassium chloride SA (KLOR-CON M) 20 MEQ tablet Take 2 tablets (40 mEq total) by mouth daily. Take extra 40 meq on day you take metolazone. Take extra 40 mg on days you use Furoxcix. 08/04/23   Katheran James, DO  sacubitril-valsartan (ENTRESTO) 24-26 MG Take 1 tablet by mouth 2 (two) times daily. 08/04/23   Katheran James, DO  spironolactone (ALDACTONE) 25 MG tablet Take 1 tablet (25 mg total) by mouth daily. 08/04/23   Katheran James, DO  torsemide (DEMADEX) 20 MG tablet Take 4 tablets (80 mg total) by mouth 2 (two) times daily. 09/20/23   Romie Minus, MD     Critical care time:

## 2023-10-12 DIAGNOSIS — I5082 Biventricular heart failure: Secondary | ICD-10-CM | POA: Diagnosis not present

## 2023-10-12 MED ORDER — IPRATROPIUM-ALBUTEROL 0.5-2.5 (3) MG/3ML IN SOLN
3.0000 mL | Freq: Two times a day (BID) | RESPIRATORY_TRACT | Status: DC
  Administered 2023-10-12 – 2023-10-13 (×2): 3 mL via RESPIRATORY_TRACT
  Filled 2023-10-12 (×2): qty 3

## 2023-10-12 NOTE — Plan of Care (Signed)
  Problem: Education: Goal: Ability to describe self-care measures that may prevent or decrease complications (Diabetes Survival Skills Education) will improve Outcome: Progressing Goal: Individualized Educational Video(s) Outcome: Progressing   Problem: Coping: Goal: Ability to adjust to condition or change in health will improve Outcome: Progressing   Problem: Fluid Volume: Goal: Ability to maintain a balanced intake and output will improve Outcome: Progressing   Problem: Health Behavior/Discharge Planning: Goal: Ability to manage health-related needs will improve Outcome: Progressing   Problem: Metabolic: Goal: Ability to maintain appropriate glucose levels will improve Outcome: Progressing   Problem: Nutritional: Goal: Maintenance of adequate nutrition will improve Outcome: Progressing Goal: Progress toward achieving an optimal weight will improve Outcome: Progressing   Problem: Skin Integrity: Goal: Risk for impaired skin integrity will decrease Outcome: Progressing   Problem: Tissue Perfusion: Goal: Adequacy of tissue perfusion will improve Outcome: Progressing   Problem: Clinical Measurements: Goal: Ability to maintain clinical measurements within normal limits will improve Outcome: Progressing Goal: Will remain free from infection Outcome: Progressing Goal: Diagnostic test results will improve Outcome: Progressing Goal: Respiratory complications will improve Outcome: Progressing Goal: Cardiovascular complication will be avoided Outcome: Progressing   Problem: Activity: Goal: Risk for activity intolerance will decrease Outcome: Progressing   Problem: Nutrition: Goal: Adequate nutrition will be maintained Outcome: Progressing   Problem: Coping: Goal: Level of anxiety will decrease Outcome: Progressing   Problem: Elimination: Goal: Will not experience complications related to bowel motility Outcome: Progressing Goal: Will not experience  complications related to urinary retention Outcome: Progressing   Problem: Pain Managment: Goal: General experience of comfort will improve and/or be controlled Outcome: Progressing   Problem: Safety: Goal: Ability to remain free from injury will improve Outcome: Progressing

## 2023-10-12 NOTE — Progress Notes (Signed)
 Daily Progress Note   Patient Name: Dakota Ramirez       Date: 10/12/2023 DOB: [birth year]  Age: 53 y.o. MRN#: [id number] Attending Physician: David Stall, Darin Engels, MD Primary Care Physician: Annett Fabian, MD Admit Date: 10/08/2023  Reason for Consultation/Follow-up: Establishing goals of care   Length of Stay: 4  Current Medications: Scheduled Meds:   Chlorhexidine Gluconate Cloth  6 each Topical Daily   digoxin  0.125 mg Oral Daily   ipratropium-albuterol  3 mL Nebulization Q6H   sodium chloride flush  10-40 mL Intracatheter Q12H    Continuous Infusions:  furosemide (LASIX) 200 mg in dextrose 5 % 100 mL (2 mg/mL) infusion 30 mg/hr (10/12/23 0619)   morphine 1 mg/hr (10/12/23 0713)    PRN Meds: acetaminophen **OR** acetaminophen, albuterol, diphenhydrAMINE, docusate sodium, glycopyrrolate **OR** glycopyrrolate **OR** glycopyrrolate, haloperidol lactate, LORazepam, morphine, ondansetron **OR** ondansetron (ZOFRAN) IV, mouth rinse, phenol, polyethylene glycol, polyvinyl alcohol, sodium chloride flush  Physical Exam Constitutional:      General: He is sleeping. He is not in acute distress.    Appearance: He is ill-appearing.  HENT:     Head: Normocephalic and atraumatic.  Pulmonary:     Effort: Pulmonary effort is normal.  Skin:    General: Skin is warm and dry.             Vital Signs: BP (!) 133/91 (BP Location: Left Arm)   Pulse (!) 108   Temp 98 F (36.7 C)   Resp 16   Ht 5\' 11"  (1.803 m)   Wt 116.2 kg   SpO2 96%   BMI 35.73 kg/m  SpO2: SpO2: 96 % O2 Device: O2 Device: Nasal Cannula O2 Flow Rate: O2 Flow Rate (L/min): 4 L/min    Patient Active Problem List   Diagnosis Date Noted   Influenza 10/10/2023   Acute respiratory failure with hypoxia and hypercapnia  (HCC) 10/10/2023   Type 2 diabetes mellitus with diabetic peripheral angiopathy with gangrene (HCC) 10/10/2023   Hypoxia 10/09/2023   CHF (congestive heart failure) (HCC) 10/08/2023   Subacute osteomyelitis of foot (HCC) 09/20/2023   Acute on chronic systolic CHF (congestive heart failure) (HCC) 09/16/2023   Acute on chronic heart failure (HCC) 07/30/2023   Non-ischemic cardiomyopathy (HCC) 07/01/2023   Congestive hepatopathy 06/30/2023   Bacteremia 06/30/2023   Mitral  valve vegetation 06/30/2023   Hypoalbuminemia due to protein-calorie malnutrition (HCC) 06/30/2023   Non-adherence to medical treatment 06/30/2023   AKI (acute kidney injury) (HCC) 06/26/2023   Hyperkalemia 06/26/2023   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 01/10/2023   Diabetic osteomyelitis (HCC) 12/28/2022   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 to breakdown of skin (HCC) 12/28/2022   Severe protein-calorie malnutrition (HCC) 12/28/2022   Acute on chronic systolic heart failure (HCC) 12/28/2022   Elevated alkaline phosphatase level 12/27/2022   Acute on chronic congestive heart failure (HCC) 12/26/2022   Hyponatremia 11/29/2021   PVD (peripheral vascular disease) (HCC) 11/29/2021   Class 2 obesity 11/29/2021   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) 11/26/2021   Diabetic foot ulcer with osteomyelitis (HCC) 11/26/2021   Essential hypertension 11/26/2021    Palliative Care Assessment & Plan   Patient Profile: 53 y.o. 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 He was found to be hypoxic and eventually required NIPPM. 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   Palliative medicine was consulted for GOC conversations.   Palliative medicine has previously seen this patient during his last admission from 09/16/2023 through 09/20/2023.  At that time the patient was a DNR, requested full scope of care, declined outpatient palliative care services.  Today's Discussion: Patient sleeping in bed in NAD. No family at bedside. [facility] plans to evaluate patient for inpatient hospice today.  1015: Spoke with patient's brother Whitfield Dulay by phone. I shared that the patient looked very comfortable this morning when I saw him. Link Snuffer shares he got the patient pizza, cookies, and a milkshake yesterday so he could enjoy some of his favorite foods. Link Snuffer tells me that although his brother could be mean at times he had a good heart and would do anything for him. Emotional support provided. Answered questions about end of life trajectory and symptom management. We discussed the plan for the patient to be evaluated for inpatient hospice today. Link Snuffer is hopeful the patient will be accepted and moved soon so family and friends can visit easier.   Encouraged family to call PMT with questions or concerns.   Recommendations/Plan: DNR Full comfort measures Comfort medications per primary team Hopeful for discharge to inpatient hospice- [facility] Continued PMT support    Code Status:    Code Status Orders  (From admission, onward)           Start     Ordered   10/08/23 1754  Full code  Continuous       Question:  By:  Answer:  Default: patient does not have capacity for decision making, no surrogate or prior directive available   10/08/23 1756         Extensive chart review has been completed prior to seeing  the patient including labs, vital signs, imaging, progress/consult notes, orders, medications, and available advance directive documents.   Care plan was discussed with  bedside RN  Time spent: 35 minutes  Thank  you for allowing the Palliative Medicine Team to assist in the care of this patient.    Sherryll Burger, NP  Please contact Palliative Medicine Team phone at [phone] for questions and concerns.

## 2023-10-12 NOTE — Progress Notes (Signed)
 [facility] PROGRESS NOTE    Progress Note  Dakota Ramirez  ZOX:[id number] DOB: [birth year] DOA: 10/08/2023 PCP: Annett Fabian, MD     Brief Narrative:   Dakota Ramirez is an 53 y.o. 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  Then readmitted in 2024 for cardiogenic shock overall diuresed over pounds  50 lb and discharged home MRI last year showed severe biventricular failure with an EF of 17% presented to his primary care office complaining of shortness of breath.  Assessment/Plan:   Acute respiratory failure with hypoxia and hypercarbia secondary to biventricular failure with an EF of less than 10%, severely fluid overloaded in the setting of influenza A: Admitted to the ICU started on a Lasix drip pressors and Tamiflu. The advanced heart failure team was consulted and they recommended for the patient to move towards comfort measures. Palliative care was consulted. All medications not related to comfort were discontinued. Started on IV morphine for pain and shortness of breath Zofran and comfort foods. Was left on digoxin and IV Lasix drip until the patient is transferred to an inpatient hospice facility.  Acute on chronic osteomyelitis/open wound foot/history of right metatarsal amputation left toe amputation/right lower extremity cellulitis: MRI of the foot in 2024 was concerning for osteomyelitis, orthopedic surgery was consulted and recommended surgical amputation once the patient was able to make medical decisions. Started on empiric antibiotics which have now have been DC'd since removing to comfort  measures. Blood cultures grew S.  Capitis likely in contaminant.  Polysubstance abuse/encephalitis Noted  Uncontrolled diabetes mellitus type 2: Noted.  DVT prophylaxis: none Family Communication:none Status is: Inpatient Remains inpatient appropriate because: Awaiting placement to inpatient hospice    Code Status:     Code Status Orders  (From admission, onward)           Start     Ordered   10/11/23 1206  Do not attempt resuscitation (DNR) - Comfort care  Continuous       Question Answer Comment  If patient has no pulse and is not breathing Do Not Attempt Resuscitation   In Pre-Arrest Conditions (Patient Is Breathing and Has a Pulse) Provide comfort measures. Relieve any mechanical airway obstruction. Avoid transfer unless required for comfort.   Consent: Discussion documented in EHR or advanced directives reviewed      10/11/23 1209           Code Status History     Date Active Date Inactive Code Status Order ID Comments User Context   10/08/2023 1756 10/11/2023 1209 Full Code [id number]  Lynnell Catalan, MD ED   09/16/2023 1538 09/20/2023 2002 Full Code [id number]  Abner Greenspan Inpatient   08/02/2023 1547 08/04/2023 1757 Do not attempt resuscitation (DNR) PRE-ARREST INTERVENTIONS DESIRED [id number]  Sherryll Burger, NP Inpatient   08/02/2023 1513 08/02/2023 1547 Limited: Do not attempt resuscitation (DNR) -DNR-LIMITED -Do Not Intubate/DNI  [id number]  Olegario Messier, MD Inpatient   07/30/2023 0337 08/02/2023 1513 Full Code [id number]  Morene Crocker, MD ED   06/26/2023 0223 07/01/2023 2211 Full Code [id number]  Duayne Cal, NP ED   12/26/2022 1737 12/29/2022 1650 DNR [id number]  Evlyn Kanner,  MD ED   12/26/2022 1722 12/26/2022 1736 Full Code [id number]  Evlyn Kanner, MD ED   11/26/2021 2132 12/01/2021 1847 Full Code [id number]  John Giovanni, MD Inpatient         IV Access:   Peripheral IV   Procedures and diagnostic studies:    No results found.   Medical Consultants:   None.   Subjective:    Dakota Ramirez confused  Objective:    Vitals:   10/11/23 2013 10/11/23 2052 10/12/23 0249 10/12/23 0503  BP:  120/71  (!) 133/91  Pulse:  (!) 101  (!) 108  Resp:  16  16  Temp:  (!) 97.5 F (36.4 C)  98 F (36.7 C)  TempSrc:  Oral    SpO2: 98% 96% 97% 96%  Weight:      Height:       SpO2: 96 % O2 Flow Rate (L/min): 4 L/min FiO2 (%): 40 %   Intake/Output Summary (Last 24 hours) at 10/12/2023 0647 Last data filed at 10/12/2023 0528 Gross per 24 hour  Intake 1142.17 ml  Output 1900 ml  Net -757.83 ml   Filed Weights   10/09/23 0400 10/10/23 0500 10/11/23 0413  Weight: 118.7 kg 116.4 kg 116.2 kg    Exam: General exam: In no acute distress. Respiratory system: Good air movement and clear to auscultation. Cardiovascular system: S1 & S2 heard, RRR. No JVD. Gastrointestinal system: Abdomen is nondistended, soft and nontender.  Extremities: No pedal edema. Skin: No rashes, lesions or ulcers  Data Reviewed:    Labs: Basic Metabolic Panel: Recent Labs  Lab 10/08/23 1506 10/08/23 1528 10/08/23 1823 10/08/23 1837 10/08/23 2335 10/09/23 0236 10/09/23 1219 10/10/23 0255 10/11/23 0412  NA 131*   < >  --    < > 136 137 135 134* 137  K 4.6   < >  --    < > 3.5 3.5 3.3* 4.4 3.9  CL 100  --   --   --   --  101 96* 97* 95*  CO2 16*  --   --   --   --  24 27 29 30   GLUCOSE 165*  --   --   --   --  113* 165* 105* 132*  BUN 36*  --   --   --   --  35* 33* 36* 42*  CREATININE 1.25*  --  1.40*  --   --  1.19 1.07 1.24 1.16  CALCIUM 8.8*  --   --   --   --  8.5* 8.3* 8.1* 8.1*  MG 1.5*  --   --   --   --  1.4* 1.6* 2.1 1.6*  PHOS  --   --   --   --   --  3.9  --   --   --    < > = values in this interval not displayed.   GFR Estimated Creatinine Clearance: 97.7 mL/min (by C-G formula based on SCr of 1.16 mg/dL). Liver Function Tests: Recent Labs  Lab 10/08/23 1506 10/08/23 2159  AST 62* 60*   ALT 46* 43  ALKPHOS 154* 141*  BILITOT 1.0 1.0  PROT 8.5* 8.4*  ALBUMIN 2.5* 2.4*   No results for input(s): "LIPASE", "AMYLASE" in the last 168 hours. No results for input(s): "AMMONIA" in the last 168 hours. Coagulation profile Recent Labs  Lab 10/08/23 1519  INR 1.4*   COVID-19 Labs  No results for input(s): "DDIMER", "FERRITIN", "LDH", "CRP"  in the last 72 hours.  Lab Results  Component Value Date   SARSCOV2NAA NEGATIVE 10/08/2023    CBC: Recent Labs  Lab 10/08/23 1456 10/08/23 1528 10/08/23 1823 10/08/23 1837 10/08/23 2335 10/09/23 0236 10/10/23 0255 10/11/23 0412  WBC 4.4  --  3.9*  --   --  6.0 5.7 4.0  HGB 13.6   < > 13.7 16.3 13.6 12.5* 12.5* 12.9*  HCT 44.0   < > 45.2 48.0 40.0 41.1 40.9 40.9  MCV 93.2  --  96.0  --   --  93.0 91.5 89.9  PLT 246  --  204  --   --  200 186 155   < > = values in this interval not displayed.   Cardiac Enzymes: No results for input(s): "CKTOTAL", "CKMB", "CKMBINDEX", "TROPONINI" in the last 168 hours. BNP (last 3 results) No results for input(s): "PROBNP" in the last 8760 hours. CBG: Recent Labs  Lab 10/10/23 1712 10/10/23 1956 10/11/23 0009 10/11/23 0415 10/11/23 0726  GLUCAP 117* 162* 188* 138* 131*   D-Dimer: No results for input(s): "DDIMER" in the last 72 hours. Hgb A1c: No results for input(s): "HGBA1C" in the last 72 hours. Lipid Profile: No results for input(s): "CHOL", "HDL", "LDLCALC", "TRIG", "CHOLHDL", "LDLDIRECT" in the last 72 hours. Thyroid function studies: No results for input(s): "TSH", "T4TOTAL", "T3FREE", "THYROIDAB" in the last 72 hours.  Invalid input(s): "FREET3" Anemia work up: No results for input(s): "VITAMINB12", "FOLATE", "FERRITIN", "TIBC", "IRON", "RETICCTPCT" in the last 72 hours. Sepsis Labs: Recent Labs  Lab 10/08/23 1823 10/08/23 1837 10/08/23 1957 10/09/23 0236 10/10/23 0255 10/10/23 1230 10/11/23 0412  PROCALCITON  --   --   --  0.20  --   --   --   WBC 3.9*  --    --  6.0 5.7  --  4.0  LATICACIDVEN  --  4.0* 3.6*  --   --  1.4  --    Microbiology Recent Results (from the past 240 hours)  Resp panel by RT-PCR (RSV, Flu A&B, Covid) Anterior Nasal Swab     Status: Abnormal   Collection Time: 10/08/23  3:02 PM   Specimen: Anterior Nasal Swab  Result Value Ref Range Status   SARS Coronavirus 2 by RT PCR NEGATIVE NEGATIVE Final   Influenza A by PCR POSITIVE (A) NEGATIVE Final   Influenza B by PCR NEGATIVE NEGATIVE Final    Comment: (NOTE) The Xpert Xpress SARS-CoV-2/FLU/RSV plus assay is intended as an aid in the diagnosis of influenza from Nasopharyngeal swab specimens and should not be used as a sole basis for treatment. Nasal washings and aspirates are unacceptable for Xpert Xpress SARS-CoV-2/FLU/RSV testing.  Fact Sheet for Patients: BloggerCourse.com  Fact Sheet for Healthcare Providers: SeriousBroker.it  This test is not yet approved or cleared by the [place] FDA and has been authorized for detection and/or diagnosis of SARS-CoV-2 by FDA under an Emergency Use Authorization (EUA). This EUA will remain in effect (meaning this test can be used) for the duration of the COVID-19 declaration under Section 564(b)(1) of the Act, 21 U.S.C. section 360bbb-3(b)(1), unless the authorization is terminated or revoked.     Resp Syncytial Virus by PCR NEGATIVE NEGATIVE Final    Comment: (NOTE) Fact Sheet for Patients: BloggerCourse.com  Fact Sheet for Healthcare Providers: SeriousBroker.it  This test is not yet approved or cleared by the [place] FDA and has been authorized for detection and/or diagnosis of SARS-CoV-2 by FDA under an Emergency Use Authorization (EUA).  This EUA will remain in effect (meaning this test can be used) for the duration of the COVID-19 declaration under Section 564(b)(1) of the Act, 21 U.S.C. section  360bbb-3(b)(1), unless the authorization is terminated or revoked.  Performed at [facility], [address], [place], [place] [address]   Blood culture (routine x 2)     Status: Abnormal   Collection Time: 10/08/23  6:23 PM   Specimen: BLOOD LEFT FOREARM  Result Value Ref Range Status   Specimen Description BLOOD LEFT FOREARM  Final   Special Requests   Final    BOTTLES DRAWN AEROBIC AND ANAEROBIC Blood Culture adequate volume   Culture  Setup Time   Final    GRAM POSITIVE COCCI AEROBIC BOTTLE ONLY CRITICAL RESULT CALLED TO, READ BACK BY AND VERIFIED WITH: G ABBOTT,PHARMD@[email or web address]  10/10/23 MK    Culture (A)  Final    STAPHYLOCOCCUS CAPITIS THE SIGNIFICANCE OF ISOLATING THIS ORGANISM FROM A SINGLE SET OF BLOOD CULTURES WHEN MULTIPLE SETS ARE DRAWN IS UNCERTAIN. PLEASE NOTIFY THE MICROBIOLOGY DEPARTMENT WITHIN ONE WEEK IF SPECIATION AND SENSITIVITIES ARE REQUIRED. Performed at [facility], [address], [place], [place] [address]    Report Status 10/11/2023 FINAL  Final  Blood Culture ID Panel (Reflexed)     Status: Abnormal   Collection Time: 10/08/23  6:23 PM  Result Value Ref Range Status   Enterococcus faecalis NOT DETECTED NOT DETECTED Final   Enterococcus Faecium NOT DETECTED NOT DETECTED Final   Listeria monocytogenes NOT DETECTED NOT DETECTED Final   Staphylococcus species DETECTED (A) NOT DETECTED Final    Comment: CRITICAL RESULT CALLED TO, READ BACK BY AND VERIFIED WITH: G ABBOTT,[email or web address]@[email or web address]  10/10/23 MK    Staphylococcus aureus (BCID) NOT DETECTED NOT DETECTED Final   Staphylococcus epidermidis NOT DETECTED NOT DETECTED Final   Staphylococcus lugdunensis NOT DETECTED NOT DETECTED Final   Streptococcus species NOT DETECTED NOT DETECTED Final   Streptococcus agalactiae NOT DETECTED NOT DETECTED Final   Streptococcus pneumoniae NOT DETECTED NOT DETECTED Final   Streptococcus pyogenes NOT DETECTED NOT DETECTED Final   A.calcoaceticus-baumannii NOT DETECTED  NOT DETECTED Final   Bacteroides fragilis NOT DETECTED NOT DETECTED Final   Enterobacterales NOT DETECTED NOT DETECTED Final   Enterobacter cloacae complex NOT DETECTED NOT DETECTED Final   Escherichia coli NOT DETECTED NOT DETECTED Final   Klebsiella aerogenes NOT DETECTED NOT DETECTED Final   Klebsiella oxytoca NOT DETECTED NOT DETECTED Final   Klebsiella pneumoniae NOT DETECTED NOT DETECTED Final   Proteus species NOT DETECTED NOT DETECTED Final   Salmonella species NOT DETECTED NOT DETECTED Final   Serratia marcescens NOT DETECTED NOT DETECTED Final   Haemophilus influenzae NOT DETECTED NOT DETECTED Final   Neisseria meningitidis NOT DETECTED NOT DETECTED Final   Pseudomonas aeruginosa NOT DETECTED NOT DETECTED Final   Stenotrophomonas maltophilia NOT DETECTED NOT DETECTED Final   Candida albicans NOT DETECTED NOT DETECTED Final   Candida auris NOT DETECTED NOT DETECTED Final   Candida glabrata NOT DETECTED NOT DETECTED Final   Candida krusei NOT DETECTED NOT DETECTED Final   Candida parapsilosis NOT DETECTED NOT DETECTED Final   Candida tropicalis NOT DETECTED NOT DETECTED Final   Cryptococcus neoformans/gattii NOT DETECTED NOT DETECTED Final    Comment: Performed at [facility], [address] [place], [place] [address]  MRSA Next Gen by PCR, Nasal     Status: None   Collection Time: 10/08/23  9:07 PM   Specimen: Nasal Mucosa; Nasal Swab  Result Value Ref Range Status   MRSA by PCR Next Gen NOT DETECTED NOT DETECTED Final    Comment: (NOTE) The GeneXpert MRSA Assay (FDA approved for NASAL specimens only), is one component of a comprehensive MRSA colonization surveillance program. It is not intended to diagnose MRSA infection nor to guide or monitor treatment for MRSA infections. Test performance is not FDA approved in patients less than 79 years old. Performed at [facility], [address] [place], [place] [address]   Blood culture (routine x 2)     Status:  None (Preliminary result)   Collection Time: 10/08/23  9:59 PM   Specimen: BLOOD RIGHT ARM  Result Value Ref Range Status   Specimen Description BLOOD RIGHT ARM  Final   Special Requests   Final    BOTTLES DRAWN AEROBIC AND ANAEROBIC Blood Culture adequate volume   Culture   Final    NO GROWTH 3 DAYS Performed at [facility], [address], [place], [place] [address]    Report Status PENDING  Incomplete     Medications:    Chlorhexidine Gluconate Cloth  6 each Topical Daily   digoxin  0.125 mg Oral Daily   ipratropium-albuterol  3 mL Nebulization Q6H   sodium chloride flush  10-40 mL Intracatheter Q12H   Continuous Infusions:  furosemide (LASIX) 200 mg in dextrose 5 % 100 mL (2 mg/mL) infusion 30 mg/hr (10/12/23 0981)   morphine Stopped (10/11/23 1503)      LOS: 4 days   Marinda Elk  [facility]  10/12/2023, 6:47 AM

## 2023-10-13 DIAGNOSIS — Z794 Long term (current) use of insulin: Secondary | ICD-10-CM

## 2023-10-13 DIAGNOSIS — R0902 Hypoxemia: Secondary | ICD-10-CM | POA: Diagnosis not present

## 2023-10-13 DIAGNOSIS — I5082 Biventricular heart failure: Secondary | ICD-10-CM | POA: Diagnosis not present

## 2023-10-13 DIAGNOSIS — E1152 Type 2 diabetes mellitus with diabetic peripheral angiopathy with gangrene: Secondary | ICD-10-CM | POA: Diagnosis not present

## 2023-10-13 DIAGNOSIS — J111 Influenza due to unidentified influenza virus with other respiratory manifestations: Secondary | ICD-10-CM | POA: Diagnosis not present

## 2023-10-13 LAB — CULTURE, BLOOD (ROUTINE X 2)
Culture: NO GROWTH
Special Requests: ADEQUATE

## 2023-10-13 MED ORDER — GLYCOPYRROLATE 0.2 MG/ML IJ SOLN: 0.2000 mg | INTRAMUSCULAR | Status: AC | PRN

## 2023-10-13 MED ORDER — MORPHINE 100MG IN NS 100ML (1MG/ML) PREMIX INFUSION
0.0000 mg/h | INTRAVENOUS | Status: AC
Start: 2023-10-13 — End: ?

## 2023-10-13 MED ORDER — LORAZEPAM 2 MG/ML IJ SOLN: 2.0000 mg | INTRAMUSCULAR | Status: AC | PRN

## 2023-10-13 NOTE — Progress Notes (Signed)
 [facility] PROGRESS NOTE    Progress Note  Dakota Ramirez  JXB:[id number] DOB: 09/17/1970 DOA: 10/08/2023 PCP: Annett Fabian, MD     Brief Narrative:   Dakota Ramirez is an 53 y.o. 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  Then readmitted in 2024 for cardiogenic shock overall diuresed over pounds  50 lb and discharged home MRI last year showed severe biventricular failure with an EF of 17% presented to his primary care office complaining of shortness of breath.  Assessment/Plan:   Acute respiratory failure with hypoxia and hypercarbia secondary to biventricular failure with an EF of less than 10%, severely fluid overloaded in the setting of influenza A: The advanced heart failure team was consulted and they recommended for the patient to move towards comfort measures. Palliative care was consulted. All medications not related to comfort were discontinued. Morphine was changed to morphine drip he appears to be comfortable. Was left on digoxin and IV Lasix drip until the patient is transferred to an inpatient hospice facility.  Acute on chronic osteomyelitis/open wound foot/history of right metatarsal amputation left toe amputation/right lower extremity cellulitis: MRI of the foot in 2024 was concerning for osteomyelitis, orthopedic surgery was consulted and recommended surgical amputation once the patient was able to make medical decisions. Started on empiric antibiotics which have now have been DC'd since removing to comfort measures. Blood cultures grew S.  Capitis likely in contaminant.  Polysubstance  abuse/encephalitis Noted  Uncontrolled diabetes mellitus type 2: Noted.  DVT prophylaxis: none Family Communication:none Status is: Inpatient Remains inpatient appropriate because: Awaiting placement to inpatient hospice    Code Status:     Code Status Orders  (From admission, onward)           Start     Ordered   10/11/23 1206  Do not attempt resuscitation (DNR) - Comfort care  Continuous       Question Answer Comment  If patient has no pulse and is not breathing Do Not Attempt Resuscitation   In Pre-Arrest Conditions (Patient Is Breathing and Has a Pulse) Provide comfort measures. Relieve any mechanical airway obstruction. Avoid transfer unless required for comfort.   Consent: Discussion documented in EHR or advanced directives reviewed      10/11/23 1209           Code Status History     Date Active Date Inactive Code Status Order ID Comments User Context   10/08/2023 1756 10/11/2023 1209 Full Code [id number]  Lynnell Catalan, MD ED   09/16/2023 1538 09/20/2023 2002 Full Code [id number]  Abner Greenspan Inpatient   08/02/2023 1547 08/04/2023 1757 Do not attempt resuscitation (DNR) PRE-ARREST INTERVENTIONS DESIRED [id number]  Sherryll Burger, NP Inpatient   08/02/2023 1513 08/02/2023 1547 Limited: Do not attempt resuscitation (DNR) -DNR-LIMITED -Do Not Intubate/DNI  [id number]  Olegario Messier, MD Inpatient   07/30/2023 0337 08/02/2023 1513 Full Code [id number]  Morene Crocker, MD ED   06/26/2023 0223 07/01/2023 2211 Full Code [id number]  Duayne Cal, NP ED   12/26/2022 1737 12/29/2022 1650 DNR [id number]  Evlyn Kanner, MD ED   12/26/2022 1722 12/26/2022 1736 Full Code [id number]  Evlyn Kanner, MD  ED   11/26/2021 2132 12/01/2021 1847 Full Code [id number]  John Giovanni, MD Inpatient         IV Access:   Peripheral IV   Procedures and diagnostic studies:   No results found.   Medical Consultants:   None.   Subjective:    [name] sleepy this morning  Objective:    Vitals:   10/12/23 0503 10/12/23 0829 10/12/23 2017 10/13/23 0504  BP: (!) 133/91   114/83  Pulse: (!) 108   100  Resp: 16   18  Temp: 98 F (36.7 C)   98.2 F (36.8 C)  TempSrc:    Axillary  SpO2: 96% 96% 95% 92%  Weight:      Height:       SpO2: 92 % O2 Flow Rate (L/min): 4 L/min FiO2 (%): 40 %   Intake/Output Summary (Last 24 hours) at 10/13/2023 1020 Last data filed at 10/13/2023 0506 Gross per 24 hour  Intake 0 ml  Output 2900 ml  Net -2900 ml   Filed Weights   10/09/23 0400 10/10/23 0500 10/11/23 0413  Weight: 118.7 kg 116.4 kg 116.2 kg    Exam: General exam: In no acute distress. Respiratory system: Good air movement and clear to auscultation. Cardiovascular system: S1 & S2 heard, RRR. No JVD. Gastrointestinal system: Abdomen is nondistended, soft and nontender.  Extremities: No pedal edema. Skin: No rashes, lesions or ulcers Psychiatry: Judgement and insight appear normal. Mood & affect appropriate. Data Reviewed:    Labs: Basic Metabolic Panel: Recent Labs  Lab 10/08/23 1506 10/08/23 1528 10/08/23 1823 10/08/23 1837 10/08/23 2335 10/09/23 0236 10/09/23 1219 10/10/23 0255 10/11/23 0412  NA 131*   < >  --    < > 136 137 135 134* 137  K 4.6   < >  --    < > 3.5 3.5 3.3* 4.4 3.9  CL 100  --   --   --   --  101 96* 97* 95*  CO2 16*  --   --   --   --  24 27 29 30   GLUCOSE 165*  --   --   --   --  113* 165* 105* 132*  BUN 36*  --   --   --   --  35* 33* 36* 42*  CREATININE 1.25*  --  1.40*  --   --  1.19 1.07 1.24 1.16  CALCIUM 8.8*  --   --   --   --  8.5* 8.3* 8.1* 8.1*  MG 1.5*  --   --   --   --  1.4* 1.6* 2.1 1.6*  PHOS  --   --   --   --   --  3.9  --   --   --    < > = values in this interval not displayed.   GFR Estimated Creatinine Clearance: 97.7 mL/min (by C-G formula based on SCr of 1.16 mg/dL). Liver Function Tests: Recent Labs  Lab 10/08/23 1506 10/08/23 2159  AST 62* 60*  ALT 46* 43   ALKPHOS 154* 141*  BILITOT 1.0 1.0  PROT 8.5* 8.4*  ALBUMIN 2.5* 2.4*   No results for input(s): "LIPASE", "AMYLASE" in the last 168 hours. No results for input(s): "AMMONIA" in the last 168 hours. Coagulation profile Recent Labs  Lab 10/08/23 1519  INR 1.4*   COVID-19 Labs  No results for input(s): "DDIMER", "FERRITIN", "LDH", "CRP" in the last 72 hours.  Lab Results  Component Value Date   SARSCOV2NAA NEGATIVE 10/08/2023    CBC: Recent Labs  Lab 10/08/23 1456 10/08/23 1528 10/08/23 1823 10/08/23 1837 10/08/23 2335 10/09/23 0236 10/10/23 0255 10/11/23 0412  WBC 4.4  --  3.9*  --   --  6.0 5.7 4.0  HGB 13.6   < > 13.7 16.3 13.6 12.5* 12.5* 12.9*  HCT 44.0   < > 45.2 48.0 40.0 41.1 40.9 40.9  MCV 93.2  --  96.0  --   --  93.0 91.5 89.9  PLT 246  --  204  --   --  200 186 155   < > = values in this interval not displayed.   Cardiac Enzymes: No results for input(s): "CKTOTAL", "CKMB", "CKMBINDEX", "TROPONINI" in the last 168 hours. BNP (last 3 results) No results for input(s): "PROBNP" in the last 8760 hours. CBG: Recent Labs  Lab 10/10/23 1712 10/10/23 1956 10/11/23 0009 10/11/23 0415 10/11/23 0726  GLUCAP 117* 162* 188* 138* 131*   D-Dimer: No results for input(s): "DDIMER" in the last 72 hours. Hgb A1c: No results for input(s): "HGBA1C" in the last 72 hours. Lipid Profile: No results for input(s): "CHOL", "HDL", "LDLCALC", "TRIG", "CHOLHDL", "LDLDIRECT" in the last 72 hours. Thyroid function studies: No results for input(s): "TSH", "T4TOTAL", "T3FREE", "THYROIDAB" in the last 72 hours.  Invalid input(s): "FREET3" Anemia work up: No results for input(s): "VITAMINB12", "FOLATE", "FERRITIN", "TIBC", "IRON", "RETICCTPCT" in the last 72 hours. Sepsis Labs: Recent Labs  Lab 10/08/23 1823 10/08/23 1837 10/08/23 1957 10/09/23 0236 10/10/23 0255 10/10/23 1230 10/11/23 0412  PROCALCITON  --   --   --  0.20  --   --   --   WBC 3.9*  --   --  6.0 5.7   --  4.0  LATICACIDVEN  --  4.0* 3.6*  --   --  1.4  --    Microbiology Recent Results (from the past 240 hours)  Resp panel by RT-PCR (RSV, Flu A&B, Covid) Anterior Nasal Swab     Status: Abnormal   Collection Time: 10/08/23  3:02 PM   Specimen: Anterior Nasal Swab  Result Value Ref Range Status   SARS Coronavirus 2 by RT PCR NEGATIVE NEGATIVE Final   Influenza A by PCR POSITIVE (A) NEGATIVE Final   Influenza B by PCR NEGATIVE NEGATIVE Final    Comment: (NOTE) The Xpert Xpress SARS-CoV-2/FLU/RSV plus assay is intended as an aid in the diagnosis of influenza from Nasopharyngeal swab specimens and should not be used as a sole basis for treatment. Nasal washings and aspirates are unacceptable for Xpert Xpress SARS-CoV-2/FLU/RSV testing.  Fact Sheet for Patients: BloggerCourse.com  Fact Sheet for Healthcare Providers: SeriousBroker.it  This test is not yet approved or cleared by the [place] FDA and has been authorized for detection and/or diagnosis of SARS-CoV-2 by FDA under an Emergency Use Authorization (EUA). This EUA will remain in effect (meaning this test can be used) for the duration of the COVID-19 declaration under Section 564(b)(1) of the Act, 21 U.S.C. section 360bbb-3(b)(1), unless the authorization is terminated or revoked.     Resp Syncytial Virus by PCR NEGATIVE NEGATIVE Final    Comment: (NOTE) Fact Sheet for Patients: BloggerCourse.com  Fact Sheet for Healthcare Providers: SeriousBroker.it  This test is not yet approved or cleared by the [place] FDA and has been authorized for detection and/or diagnosis of SARS-CoV-2 by FDA under an Emergency Use Authorization (EUA). This EUA will remain in effect (  meaning this test can be used) for the duration of the COVID-19 declaration under Section 564(b)(1) of the Act, 21 U.S.C. section 360bbb-3(b)(1), unless  the authorization is terminated or revoked.  Performed at [facility], [address], [place], [place] [address]   Blood culture (routine x 2)     Status: Abnormal   Collection Time: 10/08/23  6:23 PM   Specimen: BLOOD LEFT FOREARM  Result Value Ref Range Status   Specimen Description BLOOD LEFT FOREARM  Final   Special Requests   Final    BOTTLES DRAWN AEROBIC AND ANAEROBIC Blood Culture adequate volume   Culture  Setup Time   Final    GRAM POSITIVE COCCI AEROBIC BOTTLE ONLY CRITICAL RESULT CALLED TO, READ BACK BY AND VERIFIED WITH: G ABBOTT,PHARMD@[email or web address]  10/10/23 MK    Culture (A)  Final    STAPHYLOCOCCUS CAPITIS THE SIGNIFICANCE OF ISOLATING THIS ORGANISM FROM A SINGLE SET OF BLOOD CULTURES WHEN MULTIPLE SETS ARE DRAWN IS UNCERTAIN. PLEASE NOTIFY THE MICROBIOLOGY DEPARTMENT WITHIN ONE WEEK IF SPECIATION AND SENSITIVITIES ARE REQUIRED. Performed at [facility], [address] [place], [place] [address]    Report Status 10/11/2023 FINAL  Final  Blood Culture ID Panel (Reflexed)     Status: Abnormal   Collection Time: 10/08/23  6:23 PM  Result Value Ref Range Status   Enterococcus faecalis NOT DETECTED NOT DETECTED Final   Enterococcus Faecium NOT DETECTED NOT DETECTED Final   Listeria monocytogenes NOT DETECTED NOT DETECTED Final   Staphylococcus species DETECTED (A) NOT DETECTED Final    Comment: CRITICAL RESULT CALLED TO, READ BACK BY AND VERIFIED WITH: G ABBOTT,[email or web address]  10/10/23 MK    Staphylococcus aureus (BCID) NOT DETECTED NOT DETECTED Final   Staphylococcus epidermidis NOT DETECTED NOT DETECTED Final   Staphylococcus lugdunensis NOT DETECTED NOT DETECTED Final   Streptococcus species NOT DETECTED NOT DETECTED Final   Streptococcus agalactiae NOT DETECTED NOT DETECTED Final   Streptococcus pneumoniae NOT DETECTED NOT DETECTED Final   Streptococcus pyogenes NOT DETECTED NOT DETECTED Final   A.calcoaceticus-baumannii NOT DETECTED NOT DETECTED Final    Bacteroides fragilis NOT DETECTED NOT DETECTED Final   Enterobacterales NOT DETECTED NOT DETECTED Final   Enterobacter cloacae complex NOT DETECTED NOT DETECTED Final   Escherichia coli NOT DETECTED NOT DETECTED Final   Klebsiella aerogenes NOT DETECTED NOT DETECTED Final   Klebsiella oxytoca NOT DETECTED NOT DETECTED Final   Klebsiella pneumoniae NOT DETECTED NOT DETECTED Final   Proteus species NOT DETECTED NOT DETECTED Final   Salmonella species NOT DETECTED NOT DETECTED Final   Serratia marcescens NOT DETECTED NOT DETECTED Final   Haemophilus influenzae NOT DETECTED NOT DETECTED Final   Neisseria meningitidis NOT DETECTED NOT DETECTED Final   Pseudomonas aeruginosa NOT DETECTED NOT DETECTED Final   Stenotrophomonas maltophilia NOT DETECTED NOT DETECTED Final   Candida albicans NOT DETECTED NOT DETECTED Final   Candida auris NOT DETECTED NOT DETECTED Final   Candida glabrata NOT DETECTED NOT DETECTED Final   Candida krusei NOT DETECTED NOT DETECTED Final   Candida parapsilosis NOT DETECTED NOT DETECTED Final   Candida tropicalis NOT DETECTED NOT DETECTED Final   Cryptococcus neoformans/gattii NOT DETECTED NOT DETECTED Final    Comment: Performed at [facility], [address] [place], [place] [address]  MRSA Next Gen by PCR, Nasal     Status: None   Collection Time: 10/08/23  9:07 PM   Specimen: Nasal Mucosa; Nasal Swab  Result Value Ref Range Status  MRSA by PCR Next Gen NOT DETECTED NOT DETECTED Final    Comment: (NOTE) The GeneXpert MRSA Assay (FDA approved for NASAL specimens only), is one component of a comprehensive MRSA colonization surveillance program. It is not intended to diagnose MRSA infection nor to guide or monitor treatment for MRSA infections. Test performance is not FDA approved in patients less than 21 years old. Performed at [facility], [address] [place], [place] [address]   Blood culture (routine x 2)     Status: None   Collection  Time: 10/08/23  9:59 PM   Specimen: BLOOD RIGHT ARM  Result Value Ref Range Status   Specimen Description BLOOD RIGHT ARM  Final   Special Requests   Final    BOTTLES DRAWN AEROBIC AND ANAEROBIC Blood Culture adequate volume   Culture   Final    NO GROWTH 5 DAYS Performed at [facility], [address], [place], [place] [address]    Report Status 10/13/2023 FINAL  Final     Medications:    Chlorhexidine Gluconate Cloth  6 each Topical Daily   digoxin  0.125 mg Oral Daily   ipratropium-albuterol  3 mL Nebulization BID   sodium chloride flush  10-40 mL Intracatheter Q12H   Continuous Infusions:  furosemide (LASIX) 200 mg in dextrose 5 % 100 mL (2 mg/mL) infusion 30 mg/hr (10/12/23 2034)   morphine 3 mg/hr (10/13/23 1018)      LOS: 5 days   Marinda Elk  [facility]  10/13/2023, 10:20 AM

## 2023-10-13 NOTE — Progress Notes (Signed)
 Removed PICC-CDI.

## 2023-10-13 NOTE — Progress Notes (Addendum)
 Daily Progress Note   Patient Name: Dakota Ramirez       Date: 10/13/2023 DOB: [birth year]  Age: 53 y.o. MRN#: [id number] Attending Physician: David Stall, Darin Engels, MD Primary Care Physician: Annett Fabian, MD Admit Date: 10/08/2023  Reason for Consultation/Follow-up: Establishing goals of care   Length of Stay: 5  Current Medications: Scheduled Meds:   Chlorhexidine Gluconate Cloth  6 each Topical Daily   digoxin  0.125 mg Oral Daily   ipratropium-albuterol  3 mL Nebulization BID   sodium chloride flush  10-40 mL Intracatheter Q12H    Continuous Infusions:  furosemide (LASIX) 200 mg in dextrose 5 % 100 mL (2 mg/mL) infusion 30 mg/hr (10/12/23 2034)   morphine 1 mg/hr (10/12/23 0713)    PRN Meds: acetaminophen **OR** acetaminophen, albuterol, diphenhydrAMINE, docusate sodium, glycopyrrolate **OR** glycopyrrolate **OR** glycopyrrolate, haloperidol lactate, LORazepam, morphine, ondansetron **OR** ondansetron (ZOFRAN) IV, mouth rinse, phenol, polyethylene glycol, polyvinyl alcohol, sodium chloride flush  Physical Exam Constitutional:      General: He is sleeping. He is not in acute distress.    Appearance: He is ill-appearing.  HENT:     Head: Normocephalic and atraumatic.  Pulmonary:     Comments: unlabored Skin:    General: Skin is warm and dry.             Vital Signs: BP 114/83 (BP Location: Left Arm)   Pulse 100   Temp 98.2 F (36.8 C) (Axillary)   Resp 18   Ht 5\' 11"  (1.803 m)   Wt 116.2 kg   SpO2 92%   BMI 35.73 kg/m  SpO2: SpO2: 92 % O2 Device: O2 Device: Nasal Cannula O2 Flow Rate: O2 Flow Rate (L/min): 4 L/min    Patient Active Problem List   Diagnosis Date Noted   Influenza 10/10/2023   Acute respiratory failure with hypoxia and hypercapnia (HCC)  10/10/2023   Type 2 diabetes mellitus with diabetic peripheral angiopathy with gangrene (HCC) 10/10/2023   Hypoxia 10/09/2023   CHF (congestive heart failure) (HCC) 10/08/2023   Subacute osteomyelitis of foot (HCC) 09/20/2023   Acute on chronic systolic CHF (congestive heart failure) (HCC) 09/16/2023   Acute on chronic heart failure (HCC) 07/30/2023   Non-ischemic cardiomyopathy (HCC) 07/01/2023   Congestive hepatopathy 06/30/2023   Bacteremia 06/30/2023   Mitral valve vegetation 06/30/2023  Hypoalbuminemia due to protein-calorie malnutrition (HCC) 06/30/2023   Non-adherence to medical treatment 06/30/2023   AKI (acute kidney injury) (HCC) 06/26/2023   Hyperkalemia 06/26/2023   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 01/10/2023   Diabetic osteomyelitis (HCC) 12/28/2022   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 to breakdown of skin (HCC) 12/28/2022   Severe protein-calorie malnutrition (HCC) 12/28/2022   Acute on chronic systolic heart failure (HCC) 12/28/2022   Elevated alkaline phosphatase level 12/27/2022   Acute on chronic congestive heart failure (HCC) 12/26/2022   Hyponatremia 11/29/2021   PVD (peripheral vascular disease) (HCC) 11/29/2021   Class 2 obesity 11/29/2021   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) 11/26/2021   Diabetic foot ulcer with osteomyelitis (HCC) 11/26/2021   Essential hypertension 11/26/2021    Palliative Care Assessment & Plan   Patient Profile: 53 y.o. 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 He was found to be hypoxic and eventually required NIPPM. 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   Palliative medicine was consulted for GOC conversations.   Palliative medicine has previously seen this patient during his last admission from 09/16/2023 through 09/20/2023.  At that time the patient was a DNR, requested full scope of care, declined outpatient palliative care services.  Today's Discussion: Patient sleeping in bed. He is grunting and appears uncomfortable. He does not coherently respond when asked if he is in pain. Ask nursing to increase basal rate of continuous morphine infusion. Family at bedside. [facility] plans to evaluate patient for inpatient hospice today at 10:00 am.  Discussed anticipated trajectory for end of life. Reviewed available comfort medications and indications. Answered questions and provided emotional support. Provided family with "Gone from my sight" brochure.   Encouraged family to call PMT with questions or concerns.   Addendum 10:35: Patient accepted to inpatient hospice  Recommendations/Plan: DNR Full comfort measures Comfort medications per primary team Hopeful for discharge to inpatient hospice- [facility] Continued PMT support    Code Status:    Code Status Orders  (From admission, onward)           Start     Ordered   10/08/23 1754  Full code  Continuous       Question:  By:  Answer:  Default: patient does not have capacity for decision making, no surrogate or prior directive available   10/08/23 1756         Extensive chart review has been completed prior to seeing the patient including labs, vital signs, imaging, progress/consult notes, orders, medications, and available advance directive documents.   Care plan was discussed with  bedside RN and Nadyne Coombes CM  Time spent: 35  minutes  Thank you for allowing the Palliative Medicine Team to assist in the care of this patient.    Sherryll Burger, NP  Please contact Palliative Medicine Team phone at [phone] for questions and concerns.

## 2023-10-13 NOTE — Discharge Summary (Signed)
 Physician Discharge Summary  Dakota Ramirez WUJ:[id number] DOB: [birth year] DOA: 10/08/2023  PCP: Dakota Fabian, MD  Admit date: 10/08/2023 Discharge date: 10/13/2023  Admitted From: Home Disposition:  Home  Recommendations for Outpatient Follow-up:  Follow up with PCP in 1-2 weeks   Home Health:No Equipment/Devices:None  Discharge Condition:Stable CODE STATUS:DNR Diet recommendation: Comfort feed  Brief/Interim Summary: 53 y.o. 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  Then readmitted in 2024 for cardiogenic shock overall diuresed over pounds  50 lb and discharged home MRI last year showed severe biventricular failure with an EF of 17% presented to his primary care office complaining of shortness of breath.   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 He was not improving so the advanced heart failure team recommended moving towards comfort measures. Palliative care and physician discussed the patient's prognosis and the family agreed. He was started on IV morphine drip and he was transferred to a residential  hospice facility.  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 Blood culture grew staph capitis which is likely a contaminant.  Polysubstance abuse/encephalitis: Noted per  Uncontrolled diabetes mellitus type 2: Noted.   Discharge Instructions  Discharge Instructions     Diet - low sodium heart healthy   Complete by: As directed    Increase activity slowly   Complete by: As directed    No wound care   Complete by: As directed       Allergies as of 10/13/2023   No Known Allergies      Medication List     STOP taking these medications    digoxin 0.125 MG tablet Commonly known as: LANOXIN   Furoscix 80 MG/10ML Ctkt Generic drug: Furosemide   magnesium oxide 400 (240 Mg) MG tablet Commonly known as: MagOx 400   metolazone 5 MG tablet Commonly known as: ZAROXOLYN   NovoLOG Mix 70/30 FlexPen (70-30) 100 UNIT/ML FlexPen Generic drug: insulin aspart protamine - aspart   potassium chloride SA 20 MEQ tablet Commonly known as: KLOR-CON M   sacubitril-valsartan 24-26 MG Commonly known as: ENTRESTO   spironolactone 25 MG tablet Commonly known as: ALDACTONE   torsemide 20 MG tablet Commonly known as: DEMADEX       TAKE these medications    glycopyrrolate 0.2 MG/ML injection Commonly known as: ROBINUL Inject 1 mL (0.2 mg total) into the vein every 4 (four) hours as needed (excessive secretions).   LORazepam 2 MG/ML injection Commonly known as: ATIVAN Inject 1-2 mLs (2-4 mg total) into the vein every 4 (four) hours  as needed for anxiety or seizure.   morphine 1 mg/mL Soln infusion Inject 0-20 mg/hr into the vein continuous.        No Known Allergies  Consultations: Advanced heart failure team Palliative care   Procedures/Studies: [place] EKG SITE  RITE Result Date: 10/09/2023 If Site Rite image not attached, placement could not be confirmed due to current cardiac rhythm.  DG Chest Portable 1 View Result Date: 10/08/2023 CLINICAL DATA:  Shortness of breath and hypoxia. EXAM: PORTABLE CHEST 1 VIEW COMPARISON:  07/30/2023 FINDINGS: Shallow inspiration. Cardiac enlargement with mild pulmonary vascular congestion. Patchy infiltrates suggested in the right lung base may represent atelectasis or pneumonia. No pleural effusion or pneumothorax. Mediastinal contours appear intact. Old rib fractures. IMPRESSION: Cardiac enlargement. Patchy infiltration or atelectasis in the right base. Electronically Signed   By: Burman Nieves M.D.   On: 10/08/2023 16:02    Subjective: No complains  Discharge Exam: Vitals:   10/12/23 2017 10/13/23 0504  BP:  114/83  Pulse:  100  Resp:  18  Temp:  98.2 F (36.8 C)  SpO2: 95% 92%   Vitals:   10/12/23 0503 10/12/23 0829 10/12/23 2017 10/13/23 0504  BP: (!) 133/91   114/83  Pulse: (!) 108   100  Resp: 16   18  Temp: 98 F (36.7 C)   98.2 F (36.8 C)  TempSrc:    Axillary  SpO2: 96% 96% 95% 92%  Weight:      Height:        General: Pt is alert, awake, not in acute distress Cardiovascular: RRR, S1/S2 +, no rubs, no gallops Respiratory: CTA bilaterally, no wheezing, no rhonchi Abdominal: Soft, NT, ND, bowel sounds + Extremities: no edema, no cyanosis    The results of significant diagnostics from this hospitalization (including imaging, microbiology, ancillary and laboratory) are listed below for reference.     Microbiology: Recent Results (from the past 240 hours)  Resp panel by RT-PCR (RSV, Flu A&B, Covid) Anterior Nasal Swab     Status: Abnormal   Collection Time: 10/08/23  3:02 PM   Specimen: Anterior Nasal Swab  Result Value Ref Range Status   SARS Coronavirus 2 by RT PCR NEGATIVE NEGATIVE Final   Influenza A by PCR POSITIVE (A) NEGATIVE Final   Influenza B by PCR NEGATIVE NEGATIVE  Final    Comment: (NOTE) The Xpert Xpress SARS-CoV-2/FLU/RSV plus assay is intended as an aid in the diagnosis of influenza from Nasopharyngeal swab specimens and should not be used as a sole basis for treatment. Nasal washings and aspirates are unacceptable for Xpert Xpress SARS-CoV-2/FLU/RSV testing.  Fact Sheet for Patients: BloggerCourse.com  Fact Sheet for Healthcare Providers: SeriousBroker.it  This test is not yet approved or cleared by the [place] FDA and has been authorized for detection and/or diagnosis of SARS-CoV-2 by FDA under an Emergency Use Authorization (EUA). This EUA will remain in effect (meaning this test can be used) for the duration of the COVID-19 declaration under Section 564(b)(1) of the Act, 21 U.S.C. section 360bbb-3(b)(1), unless the authorization is terminated or revoked.     Resp Syncytial Virus by PCR NEGATIVE NEGATIVE Final    Comment: (NOTE) Fact Sheet for Patients: BloggerCourse.com  Fact Sheet for Healthcare Providers: SeriousBroker.it  This test is not yet approved or cleared by the [place] FDA and has been authorized for detection and/or diagnosis of SARS-CoV-2 by FDA under an Emergency Use Authorization (EUA). This EUA will remain in effect (meaning this test can be  used) for the duration of the COVID-19 declaration under Section 564(b)(1) of the Act, 21 U.S.C. section 360bbb-3(b)(1), unless the authorization is terminated or revoked.  Performed at [facility], [address], [place], [place] [address]   Blood culture (routine x 2)     Status: Abnormal   Collection Time: 10/08/23  6:23 PM   Specimen: BLOOD LEFT FOREARM  Result Value Ref Range Status   Specimen Description BLOOD LEFT FOREARM  Final   Special Requests   Final    BOTTLES DRAWN AEROBIC AND ANAEROBIC Blood Culture adequate volume   Culture  Setup Time    Final    GRAM POSITIVE COCCI AEROBIC BOTTLE ONLY CRITICAL RESULT CALLED TO, READ BACK BY AND VERIFIED WITH: G ABBOTT,PHARMD@0032  10/10/23 MK    Culture (A)  Final    STAPHYLOCOCCUS CAPITIS THE SIGNIFICANCE OF ISOLATING THIS ORGANISM FROM A SINGLE SET OF BLOOD CULTURES WHEN MULTIPLE SETS ARE DRAWN IS UNCERTAIN. PLEASE NOTIFY THE MICROBIOLOGY DEPARTMENT WITHIN ONE WEEK IF SPECIATION AND SENSITIVITIES ARE REQUIRED. Performed at [facility], [address], [place], [place] [address]    Report Status 10/11/2023 FINAL  Final  Blood Culture ID Panel (Reflexed)     Status: Abnormal   Collection Time: 10/08/23  6:23 PM  Result Value Ref Range Status   Enterococcus faecalis NOT DETECTED NOT DETECTED Final   Enterococcus Faecium NOT DETECTED NOT DETECTED Final   Listeria monocytogenes NOT DETECTED NOT DETECTED Final   Staphylococcus species DETECTED (A) NOT DETECTED Final    Comment: CRITICAL RESULT CALLED TO, READ BACK BY AND VERIFIED WITH: G ABBOTT,PHARMD@0032  10/10/23 MK    Staphylococcus aureus (BCID) NOT DETECTED NOT DETECTED Final   Staphylococcus epidermidis NOT DETECTED NOT DETECTED Final   Staphylococcus lugdunensis NOT DETECTED NOT DETECTED Final   Streptococcus species NOT DETECTED NOT DETECTED Final   Streptococcus agalactiae NOT DETECTED NOT DETECTED Final   Streptococcus pneumoniae NOT DETECTED NOT DETECTED Final   Streptococcus pyogenes NOT DETECTED NOT DETECTED Final   A.calcoaceticus-baumannii NOT DETECTED NOT DETECTED Final   Bacteroides fragilis NOT DETECTED NOT DETECTED Final   Enterobacterales NOT DETECTED NOT DETECTED Final   Enterobacter cloacae complex NOT DETECTED NOT DETECTED Final   Escherichia coli NOT DETECTED NOT DETECTED Final   Klebsiella aerogenes NOT DETECTED NOT DETECTED Final   Klebsiella oxytoca NOT DETECTED NOT DETECTED Final   Klebsiella pneumoniae NOT DETECTED NOT DETECTED Final   Proteus species NOT DETECTED NOT DETECTED Final   Salmonella  species NOT DETECTED NOT DETECTED Final   Serratia marcescens NOT DETECTED NOT DETECTED Final   Haemophilus influenzae NOT DETECTED NOT DETECTED Final   Neisseria meningitidis NOT DETECTED NOT DETECTED Final   Pseudomonas aeruginosa NOT DETECTED NOT DETECTED Final   Stenotrophomonas maltophilia NOT DETECTED NOT DETECTED Final   Candida albicans NOT DETECTED NOT DETECTED Final   Candida auris NOT DETECTED NOT DETECTED Final   Candida glabrata NOT DETECTED NOT DETECTED Final   Candida krusei NOT DETECTED NOT DETECTED Final   Candida parapsilosis NOT DETECTED NOT DETECTED Final   Candida tropicalis NOT DETECTED NOT DETECTED Final   Cryptococcus neoformans/gattii NOT DETECTED NOT DETECTED Final    Comment: Performed at [facility], [address] [place], [place] [address]  MRSA Next Gen by PCR, Nasal     Status: None   Collection Time: 10/08/23  9:07 PM   Specimen: Nasal Mucosa; Nasal Swab  Result Value Ref Range Status   MRSA by PCR  Next Gen NOT DETECTED NOT DETECTED Final    Comment: (NOTE) The GeneXpert MRSA Assay (FDA approved for NASAL specimens only), is one component of a comprehensive MRSA colonization surveillance program. It is not intended to diagnose MRSA infection nor to guide or monitor treatment for MRSA infections. Test performance is not FDA approved in patients less than 89 years old. Performed at [facility], [address] [place], [place] [address]   Blood culture (routine x 2)     Status: None   Collection Time: 10/08/23  9:59 PM   Specimen: BLOOD RIGHT ARM  Result Value Ref Range Status   Specimen Description BLOOD RIGHT ARM  Final   Special Requests   Final    BOTTLES DRAWN AEROBIC AND ANAEROBIC Blood Culture adequate volume   Culture   Final    NO GROWTH 5 DAYS Performed at [facility], [address] [place], [place] [address]    Report Status 10/13/2023 FINAL  Final     Labs: BNP (last 3 results) Recent Labs     07/30/23 0044 08/19/23 1536 10/08/23 1456  BNP 2,303.1* >4,500.0* 2,909.0*   Basic Metabolic Panel: Recent Labs  Lab 10/08/23 1506 10/08/23 1528 10/08/23 1823 10/08/23 1837 10/08/23 2335 10/09/23 0236 10/09/23 1219 10/10/23 0255 10/11/23 0412  NA 131*   < >  --    < > 136 137 135 134* 137  K 4.6   < >  --    < > 3.5 3.5 3.3* 4.4 3.9  CL 100  --   --   --   --  101 96* 97* 95*  CO2 16*  --   --   --   --  24 27 29 30   GLUCOSE 165*  --   --   --   --  113* 165* 105* 132*  BUN 36*  --   --   --   --  35* 33* 36* 42*  CREATININE 1.25*  --  1.40*  --   --  1.19 1.07 1.24 1.16  CALCIUM 8.8*  --   --   --   --  8.5* 8.3* 8.1* 8.1*  MG 1.5*  --   --   --   --  1.4* 1.6* 2.1 1.6*  PHOS  --   --   --   --   --  3.9  --   --   --    < > = values in this interval not displayed.   Liver Function Tests: Recent Labs  Lab 10/08/23 1506 10/08/23 2159  AST 62* 60*  ALT 46* 43  ALKPHOS 154* 141*  BILITOT 1.0 1.0  PROT 8.5* 8.4*  ALBUMIN 2.5* 2.4*   No results for input(s): "LIPASE", "AMYLASE" in the last 168 hours. No results for input(s): "AMMONIA" in the last 168 hours. CBC: Recent Labs  Lab 10/08/23 1456 10/08/23 1528 10/08/23 1823 10/08/23 1837 10/08/23 2335 10/09/23 0236 10/10/23 0255 10/11/23 0412  WBC 4.4  --  3.9*  --   --  6.0 5.7 4.0  HGB 13.6   < > 13.7 16.3 13.6 12.5* 12.5* 12.9*  HCT 44.0   < > 45.2 48.0 40.0 41.1 40.9 40.9  MCV 93.2  --  96.0  --   --  93.0 91.5 89.9  PLT 246  --  204  --   --  200 186 155   < > = values in this interval not displayed.   Cardiac  Enzymes: No results for input(s): "CKTOTAL", "CKMB", "CKMBINDEX", "TROPONINI" in the last 168 hours. BNP: Invalid input(s): "POCBNP" CBG: Recent Labs  Lab 10/10/23 1712 10/10/23 1956 10/11/23 0009 10/11/23 0415 10/11/23 0726  GLUCAP 117* 162* 188* 138* 131*   D-Dimer No results for input(s): "DDIMER" in the last 72 hours. Hgb A1c No results for input(s): "HGBA1C" in the last 72  hours. Lipid Profile No results for input(s): "CHOL", "HDL", "LDLCALC", "TRIG", "CHOLHDL", "LDLDIRECT" in the last 72 hours. Thyroid function studies No results for input(s): "TSH", "T4TOTAL", "T3FREE", "THYROIDAB" in the last 72 hours.  Invalid input(s): "FREET3" Anemia work up No results for input(s): "VITAMINB12", "FOLATE", "FERRITIN", "TIBC", "IRON", "RETICCTPCT" in the last 72 hours. Urinalysis    Component Value Date/Time   COLORURINE YELLOW 07/30/2023 0253   APPEARANCEUR CLEAR 07/30/2023 0253   LABSPEC 1.011 07/30/2023 0253   PHURINE 5.0 07/30/2023 0253   GLUCOSEU 50 (A) 07/30/2023 0253   HGBUR MODERATE (A) 07/30/2023 0253   BILIRUBINUR NEGATIVE 07/30/2023 0253   KETONESUR NEGATIVE 07/30/2023 0253   PROTEINUR 100 (A) 07/30/2023 0253   NITRITE NEGATIVE 07/30/2023 0253   LEUKOCYTESUR NEGATIVE 07/30/2023 0253   Sepsis Labs Recent Labs  Lab 10/08/23 1823 10/09/23 0236 10/10/23 0255 10/11/23 0412  WBC 3.9* 6.0 5.7 4.0   Microbiology Recent Results (from the past 240 hours)  Resp panel by RT-PCR (RSV, Flu A&B, Covid) Anterior Nasal Swab     Status: Abnormal   Collection Time: 10/08/23  3:02 PM   Specimen: Anterior Nasal Swab  Result Value Ref Range Status   SARS Coronavirus 2 by RT PCR NEGATIVE NEGATIVE Final   Influenza A by PCR POSITIVE (A) NEGATIVE Final   Influenza B by PCR NEGATIVE NEGATIVE Final    Comment: (NOTE) The Xpert Xpress SARS-CoV-2/FLU/RSV plus assay is intended as an aid in the diagnosis of influenza from Nasopharyngeal swab specimens and should not be used as a sole basis for treatment. Nasal washings and aspirates are unacceptable for Xpert Xpress SARS-CoV-2/FLU/RSV testing.  Fact Sheet for Patients: BloggerCourse.com  Fact Sheet for Healthcare Providers: SeriousBroker.it  This test is not yet approved or cleared by the [place] FDA and has been authorized for detection and/or diagnosis  of SARS-CoV-2 by FDA under an Emergency Use Authorization (EUA). This EUA will remain in effect (meaning this test can be used) for the duration of the COVID-19 declaration under Section 564(b)(1) of the Act, 21 U.S.C. section 360bbb-3(b)(1), unless the authorization is terminated or revoked.     Resp Syncytial Virus by PCR NEGATIVE NEGATIVE Final    Comment: (NOTE) Fact Sheet for Patients: BloggerCourse.com  Fact Sheet for Healthcare Providers: SeriousBroker.it  This test is not yet approved or cleared by the [place] FDA and has been authorized for detection and/or diagnosis of SARS-CoV-2 by FDA under an Emergency Use Authorization (EUA). This EUA will remain in effect (meaning this test can be used) for the duration of the COVID-19 declaration under Section 564(b)(1) of the Act, 21 U.S.C. section 360bbb-3(b)(1), unless the authorization is terminated or revoked.  Performed at [facility], [address], [place], [place] [address]   Blood culture (routine x 2)     Status: Abnormal   Collection Time: 10/08/23  6:23 PM   Specimen: BLOOD LEFT FOREARM  Result Value Ref Range Status   Specimen Description BLOOD LEFT FOREARM  Final   Special Requests   Final    BOTTLES DRAWN AEROBIC AND ANAEROBIC Blood Culture adequate volume  Culture  Setup Time   Final    GRAM POSITIVE COCCI AEROBIC BOTTLE ONLY CRITICAL RESULT CALLED TO, READ BACK BY AND VERIFIED WITH: G ABBOTT,PHARMD@0032  10/10/23 MK    Culture (A)  Final    STAPHYLOCOCCUS CAPITIS THE SIGNIFICANCE OF ISOLATING THIS ORGANISM FROM A SINGLE SET OF BLOOD CULTURES WHEN MULTIPLE SETS ARE DRAWN IS UNCERTAIN. PLEASE NOTIFY THE MICROBIOLOGY DEPARTMENT WITHIN ONE WEEK IF SPECIATION AND SENSITIVITIES ARE REQUIRED. Performed at [facility], [address], [place], [place] [address]    Report Status 10/11/2023 FINAL  Final  Blood Culture ID Panel (Reflexed)      Status: Abnormal   Collection Time: 10/08/23  6:23 PM  Result Value Ref Range Status   Enterococcus faecalis NOT DETECTED NOT DETECTED Final   Enterococcus Faecium NOT DETECTED NOT DETECTED Final   Listeria monocytogenes NOT DETECTED NOT DETECTED Final   Staphylococcus species DETECTED (A) NOT DETECTED Final    Comment: CRITICAL RESULT CALLED TO, READ BACK BY AND VERIFIED WITH: G ABBOTT,PHARMD@0032  10/10/23 MK    Staphylococcus aureus (BCID) NOT DETECTED NOT DETECTED Final   Staphylococcus epidermidis NOT DETECTED NOT DETECTED Final   Staphylococcus lugdunensis NOT DETECTED NOT DETECTED Final   Streptococcus species NOT DETECTED NOT DETECTED Final   Streptococcus agalactiae NOT DETECTED NOT DETECTED Final   Streptococcus pneumoniae NOT DETECTED NOT DETECTED Final   Streptococcus pyogenes NOT DETECTED NOT DETECTED Final   A.calcoaceticus-baumannii NOT DETECTED NOT DETECTED Final   Bacteroides fragilis NOT DETECTED NOT DETECTED Final   Enterobacterales NOT DETECTED NOT DETECTED Final   Enterobacter cloacae complex NOT DETECTED NOT DETECTED Final   Escherichia coli NOT DETECTED NOT DETECTED Final   Klebsiella aerogenes NOT DETECTED NOT DETECTED Final   Klebsiella oxytoca NOT DETECTED NOT DETECTED Final   Klebsiella pneumoniae NOT DETECTED NOT DETECTED Final   Proteus species NOT DETECTED NOT DETECTED Final   Salmonella species NOT DETECTED NOT DETECTED Final   Serratia marcescens NOT DETECTED NOT DETECTED Final   Haemophilus influenzae NOT DETECTED NOT DETECTED Final   Neisseria meningitidis NOT DETECTED NOT DETECTED Final   Pseudomonas aeruginosa NOT DETECTED NOT DETECTED Final   Stenotrophomonas maltophilia NOT DETECTED NOT DETECTED Final   Candida albicans NOT DETECTED NOT DETECTED Final   Candida auris NOT DETECTED NOT DETECTED Final   Candida glabrata NOT DETECTED NOT DETECTED Final   Candida krusei NOT DETECTED NOT DETECTED Final   Candida parapsilosis NOT DETECTED NOT DETECTED  Final   Candida tropicalis NOT DETECTED NOT DETECTED Final   Cryptococcus neoformans/gattii NOT DETECTED NOT DETECTED Final    Comment: Performed at [facility], [address] [place], [place] [address]  MRSA Next Gen by PCR, Nasal     Status: None   Collection Time: 10/08/23  9:07 PM   Specimen: Nasal Mucosa; Nasal Swab  Result Value Ref Range Status   MRSA by PCR Next Gen NOT DETECTED NOT DETECTED Final    Comment: (NOTE) The GeneXpert MRSA Assay (FDA approved for NASAL specimens only), is one component of a comprehensive MRSA colonization surveillance program. It is not intended to diagnose MRSA infection nor to guide or monitor treatment for MRSA infections. Test performance is not FDA approved in patients less than 77 years old. Performed at [facility], [address] [place], [place] [address]   Blood culture (routine x 2)     Status: None   Collection Time: 10/08/23  9:59 PM   Specimen: BLOOD RIGHT ARM  Result Value Ref Range Status   Specimen Description BLOOD RIGHT ARM  Final   Special Requests   Final    BOTTLES DRAWN AEROBIC AND ANAEROBIC Blood Culture adequate volume   Culture   Final    NO GROWTH 5 DAYS Performed at [facility], [address], [place], [place] [address]    Report Status 10/13/2023 FINAL  Final     Time coordinating discharge: Over 35 minutes  SIGNED:   Marinda Elk, MD  [facility] 10/13/2023, 10:50 AM Pager   If 7PM-7AM, please contact night-coverage [email or web address] Password [email or web address]

## 2023-10-13 NOTE — TOC Progression Note (Signed)
 Transition of Care [facility]) - Progression Note    Patient Details  Name: Dakota Ramirez MRN: [id number] Date of Birth: 05/31/1971  Transition of Care [facility]) CM/SW Contact  Donnalee Curry, LCSWA Phone Number: 10/13/2023, 1:26 PM  Clinical Narrative:     Per bedside RN, pt accepted by [facility].  SW spoke with [facility] [place] [phone]) requested SW call [facility]  SW spoke with Morrie Sheldon [facility] [phone]) reports consents are not in yet.   Per CM Donnamae Jude, RN, [facility] [place] [phone]) reports pt's brother was supposed to do consents but has not been able to complete yet.   Update 351pm Consents still pending.  Update 437pm [facility] called.   Expected Discharge Plan: Home/Self Care Barriers to Discharge: Continued Medical Work up  Expected Discharge Plan and Services       Living arrangements for the past 2 months: Mobile Home Expected Discharge Date: 10/13/23                                     Social Determinants of Health (SDOH) Interventions SDOH Screenings   Food Insecurity: Patient Unable To Answer (10/10/2023)  Recent Concern: Food Insecurity - Food Insecurity Present (09/16/2023)  Housing: Patient Unable To Answer (10/10/2023)  Transportation Needs: Patient Unable To Answer (10/10/2023)  Utilities: Patient Unable To Answer (10/10/2023)  Depression (PHQ2-9): Low Risk  (01/17/2023)  Recent Concern: Depression (PHQ2-9) - Medium Risk (01/10/2023)  Financial Resource Strain: High Risk (01/10/2023)  Social Connections: Unknown (12/15/2021)   [facility] from [facility]  Stress: No Stress Concern Present (02/28/2021)   Received from [facility], [facility]  Tobacco Use: Low Risk  (10/08/2023)    Readmission Risk Interventions    09/17/2023    2:43 PM  Readmission Risk Prevention Plan  Transportation Screening Complete  PCP or Specialist Appt within 5-7 Days Complete  Home Care Screening  Complete  Medication Review (RN CM) Complete

## 2023-10-13 NOTE — TOC Progression Note (Addendum)
 Transition of Care [facility]) - Progression Note    Patient Details  Name: Dakota Ramirez MRN: [id number] Date of Birth: 03-11-1971  [facility] CM/SW Contact  Ronny Bacon, RN Phone Number: 10/13/2023, 7:42 AM  Clinical Narrative:   Secure message from Paliative care provider regarding hospice follow up. Call to [facility] on cal message center, awaiting return call.  0800: return call received from [facility], will see patient today around 10 am. Provider made aware.  1320: Spoke with [facility] [place], brother has not signed consent needed to transfer patient to hospice facility. Kendal Hymen has tried calling the brother but no response. Per Kendal Hymen, brother reports having mouth surgery and took pain medication, possible that brother is sleeping. LCSW updated on events and delay.    Expected Discharge Plan: Home/Self Care Barriers to Discharge: Continued Medical Work up  Expected Discharge Plan and Services       Living arrangements for the past 2 months: Mobile Home                                       Social Determinants of Health (SDOH) Interventions SDOH Screenings   Food Insecurity: Patient Unable To Answer (10/10/2023)  Recent Concern: Food Insecurity - Food Insecurity Present (09/16/2023)  Housing: Patient Unable To Answer (10/10/2023)  Transportation Needs: Patient Unable To Answer (10/10/2023)  Utilities: Patient Unable To Answer (10/10/2023)  Depression (PHQ2-9): Low Risk  (01/17/2023)  Recent Concern: Depression (PHQ2-9) - Medium Risk (01/10/2023)  Financial Resource Strain: High Risk (01/10/2023)  Social Connections: Unknown (12/15/2021)   Received from [name]  Stress: No Stress Concern Present (02/28/2021)   Received from [facility], [facility]  Tobacco Use: Low Risk  (10/08/2023)    Readmission Risk Interventions    09/17/2023    2:43 PM  Readmission Risk Prevention Plan  Transportation  Screening Complete  PCP or Specialist Appt within 5-7 Days Complete  Home Care Screening Complete  Medication Review (RN CM) Complete

## 2023-10-18 ENCOUNTER — Other Ambulatory Visit: Payer: Self-pay | Admitting: *Deleted

## 2023-10-18 NOTE — Patient Outreach (Signed)
   Care Management/Care Coordination  RN Case Manager Case Closure Note  10/18/2023 Name: MAURILIO PURYEAR MRN: [id number] DOB: 1971-04-10  Dakota Ramirez is a 53 y.o. year old male who is a primary care patient of Annett Fabian, MD. The care management/care coordination team was consulted for assistance with chronic disease management and/or care coordination needs.   Care Plan : RN Care Manager Plan of Care  Updates made by Heidi Dach, RN since 10/18/2023 12:00 AM  Completed 10/18/2023   Problem: Health Management needs related to CHF Resolved 10/18/2023     Long-Range Goal: Development of Plan of Care to address Health Management needs related to CHF Completed 10/18/2023  Start Date: 10/01/2023  Expected End Date: 12/30/2023  Note:   Current Barriers:  Chronic Disease Management support and education needs related to CHF  Patient discharged to Hospice on 10/30/2023  RNCM Clinical Goal(s):  Patient will verbalize understanding of plan for management of CHF as evidenced by Patient reports take all medications exactly as prescribed and will call provider for medication related questions as evidenced by Patient reports attend all scheduled medical appointments: 10/02/23 with [facility] Clinic as evidenced by Provider documentation in EMR continue to work with RN Care Manager to address care management and care coordination needs related to  CHF as evidenced by adherence to CM Team Scheduled appointments through collaboration with RN Care manager, provider, and care team.   Interventions: Evaluation of current treatment plan related to  self management and patient's adherence to plan as established by provider   Heart Failure Interventions:  (Status:  Goal Not Met.) Long Term Goal Basic overview and discussion of pathophysiology of Heart Failure reviewed Provided education on low sodium diet Reviewed role of diuretics in prevention of fluid overload and management of heart failure; Discussed the  importance of keeping all appointments with provider Assessed social determinant of health barriers  Assisted with contacting [facility] in [place], requesting transfer of medications, compliance packaging and free delivery Advised patient to contact [facility] [phone] with any medication needs or changes Discussed follow up appointment with HF Clinic on 10/02/23, advised patient to take all medications to this visit Collaborated with Internal Medicine, requesting assistance with rescheduling missed appointment  Patient Goals/Self-Care Activities: Take all medications as prescribed Attend all scheduled provider appointments Call pharmacy for medication refills 3-7 days in advance of running out of medications Call provider office for new concerns or questions   Follow Up Plan:  Telephone follow up appointment with care management team member scheduled for:  10/10/23 at 3:15pm      Plan:  Patient was discharged to Hospice on 10/31/2023.No further follow up from [facility].   Estanislado Emms RN, BSN [place]  [facility] RN Care Manager [phone]

## 2023-10-25 ENCOUNTER — Encounter: Admitting: Internal Medicine

## 2023-10-25 NOTE — Progress Notes (Deleted)
 Dakota Ramirez

## 2023-11-12 DEATH — deceased

## 2024-06-15 ENCOUNTER — Encounter: Payer: Self-pay | Admitting: Radiology
